# Patient Record
Sex: Male | Born: 1960
Health system: Southern US, Community
[De-identification: ages and names within clinical notes are randomized; demographics above are authoritative.]

## PROBLEM LIST (undated history)

## (undated) DIAGNOSIS — I2510 Atherosclerotic heart disease of native coronary artery without angina pectoris: Secondary | ICD-10-CM

## (undated) DIAGNOSIS — I4891 Unspecified atrial fibrillation: Secondary | ICD-10-CM

## (undated) DIAGNOSIS — R519 Headache, unspecified: Secondary | ICD-10-CM

## (undated) DIAGNOSIS — I4819 Other persistent atrial fibrillation: Secondary | ICD-10-CM

## (undated) DIAGNOSIS — I209 Angina pectoris, unspecified: Secondary | ICD-10-CM

## (undated) DIAGNOSIS — L570 Actinic keratosis: Secondary | ICD-10-CM

## (undated) DIAGNOSIS — I4892 Unspecified atrial flutter: Secondary | ICD-10-CM

## (undated) DIAGNOSIS — K219 Gastro-esophageal reflux disease without esophagitis: Secondary | ICD-10-CM

## (undated) DIAGNOSIS — G4730 Sleep apnea, unspecified: Secondary | ICD-10-CM

## (undated) DIAGNOSIS — G4700 Insomnia, unspecified: Secondary | ICD-10-CM

## (undated) DIAGNOSIS — I351 Nonrheumatic aortic (valve) insufficiency: Secondary | ICD-10-CM

## (undated) DIAGNOSIS — M5430 Sciatica, unspecified side: Secondary | ICD-10-CM

## (undated) DIAGNOSIS — I509 Heart failure, unspecified: Secondary | ICD-10-CM

## (undated) DIAGNOSIS — Z8673 Personal history of transient ischemic attack (TIA), and cerebral infarction without residual deficits: Secondary | ICD-10-CM

## (undated) DIAGNOSIS — I10 Essential (primary) hypertension: Secondary | ICD-10-CM

## (undated) DIAGNOSIS — I429 Cardiomyopathy, unspecified: Secondary | ICD-10-CM

## (undated) DIAGNOSIS — M1990 Unspecified osteoarthritis, unspecified site: Secondary | ICD-10-CM

## (undated) DIAGNOSIS — K760 Fatty (change of) liver, not elsewhere classified: Secondary | ICD-10-CM

## (undated) HISTORY — PX: KNEE SURGERY: SHX244

## (undated) HISTORY — DX: Atherosclerotic heart disease of native coronary artery without angina pectoris: I25.10

## (undated) HISTORY — DX: Cardiomyopathy, unspecified: I42.9

## (undated) HISTORY — DX: Unspecified atrial fibrillation: I48.91

## (undated) HISTORY — PX: COLONOSCOPY: SHX5424

## (undated) HISTORY — DX: Unspecified atrial flutter: I48.92

## (undated) HISTORY — DX: Actinic keratosis: L57.0

## (undated) HISTORY — PX: ESOPHAGOGASTRODUODENOSCOPY: SHX1529

## (undated) HISTORY — DX: Sciatica, unspecified side: M54.30

## (undated) HISTORY — DX: Other persistent atrial fibrillation: I48.19

## (undated) HISTORY — DX: Nonrheumatic aortic (valve) insufficiency: I35.1

## (undated) HISTORY — PX: ROTATOR CUFF REPAIR: SHX139

## (undated) HISTORY — PX: ABLATION: SHX5711

## (undated) HISTORY — PX: HERNIA REPAIR: SHX51

---

## 2010-05-02 ENCOUNTER — Ambulatory Visit: Payer: Self-pay | Admitting: Gastroenterology

## 2011-03-21 ENCOUNTER — Encounter: Payer: Self-pay | Admitting: Internal Medicine

## 2011-03-21 ENCOUNTER — Ambulatory Visit (INDEPENDENT_AMBULATORY_CARE_PROVIDER_SITE_OTHER): Payer: PRIVATE HEALTH INSURANCE | Admitting: Internal Medicine

## 2011-03-21 VITALS — BP 131/91 | HR 84 | Temp 98.2°F | Resp 16 | Ht 72.0 in | Wt 206.0 lb

## 2011-03-21 DIAGNOSIS — B029 Zoster without complications: Secondary | ICD-10-CM

## 2011-03-21 MED ORDER — GABAPENTIN 100 MG PO CAPS: 100.0000 mg | ORAL_CAPSULE | Freq: Three times a day (TID) | ORAL | Status: DC

## 2011-03-21 NOTE — Patient Instructions (Signed)
Shingles (Herpes Zoster)  Shingles is caused by the same virus that causes chicken pox (varicella zoster virus or VZV). Shingles often occurs many years or decades after having chicken pox. That is why it is more common in adults older than 50 years. The virus reactivates and breaks out as an infection in a nerve root.  SYMPTOMS   The initial feeling (sensations) may be pain. This pain is usually described as:    Burning.    Stabbing.    Throbbing.    Tingling in the nerve root.    A red rash will follow in a couple days. The rash may occur in any area of the body and is usually on one side (unilateral) of the body in a band or belt-like pattern. The rash usually starts out as very small blisters (vesicles). They will dry up after 7 to 10 days. This is not usually a significant problem except for the pain it causes.    Long lasting (chronic) pain is more likely in an elderly person. It can last months to years. This condition is called post-herpetic neuralgia.   Shingles can be an extremely severe infection in someone with AIDS, a weakened immune system or with forms of leukemia. It can also be severe if you are taking transplant medications or other medications that weaken the immune system.  TREATMENT  Your caregiver will often treat you with:   Antiviral drugs.    Anti-inflammatory drugs.    Pain medications.    Bed rest is very important in preventing the pain associated with herpes zoster (post-herpetic neuralgia).    Application of heat in the form of a hot-water bottle or electric heating pad or gentle pressure with the hand is recommended to help with the pain or discomfort.   PREVENTION  A varicella zoster vaccine is available to help protect against the virus. The Food and Drug Administration approved the varicella zoster vaccine for individuals 50 years of age and older.  HOME CARE INSTRUCTIONS   Cool compresses to the area of rash may be helpful.    Only take over-the-counter or  prescription medicines for pain, discomfort or fever as directed by your caregiver.    Avoid contact with:    Babies.    Pregnant women.    Children with eczema.    Elderly people with transplants.    People with chronic illnesses, such as leukemia and AIDS.    If the area involved is on your face, you may receive a referral for follow-up to a specialist. It is very important to keep all follow-up appointments. This will help avoid eye complications, chronic pain or disability.   SEEK IMMEDIATE MEDICAL CARE IF:   You develop any pain (headache) in the area of the face or eye. This must be followed carefully by your caregiver or ophthalmologist. An infection in part of your eye (cornea) can be very serious. It could lead to blindness.    You do not have pain relief from prescribed medications.    The redness or swelling spreads.    The area involved becomes very swollen and painful.    You have an oral temperature above 102 F (38 C), not controlled by medicine.    You notice any red or painful lines extending away from the affected area toward your heart (lymphangitis).    Your condition is worsening or has changed.   Document Released: 07/14/2005 Document Re-Released: 01/01/2010  Exit[employer] Patient Information 2011 [employer].

## 2011-03-22 ENCOUNTER — Encounter: Payer: Self-pay | Admitting: Internal Medicine

## 2011-03-22 NOTE — Progress Notes (Signed)
  Subjective:    Patient ID: Jake Liu, male    DOB: [birth year], 50 y.o.   MRN: [id number]  Rash This is a new problem. The current episode started in the past 7 days. The problem has been gradually improving since onset. The affected locations include the torso (right upper chest). The rash is characterized by redness, pain, itchiness, burning and blistering. He was exposed to nothing. Pertinent negatives include no cough, fatigue, fever or shortness of breath. Past treatments include nothing.      Review of Systems  Constitutional: Negative for fever, chills, activity change and fatigue.  Respiratory: Negative for cough and shortness of breath.   Gastrointestinal: Negative for nausea.  Musculoskeletal: Negative for myalgias, back pain, joint swelling and arthralgias.  Skin: Positive for color change and rash.   BP 131/91  Pulse 84  Temp(Src) 98.2 F (36.8 C) (Oral)  Resp 16  Ht 6' (1.829 m)  Wt 206 lb (93.441 kg)  BMI 27.94 kg/m2  SpO2 98%     Objective:   Physical Exam  Constitutional: He is oriented to person, place, and time. He appears well-developed and well-nourished.  HENT:  Head: Normocephalic and atraumatic.  Eyes: Conjunctivae and EOM are normal. Pupils are equal, round, and reactive to light. No scleral icterus.  Neck: Normal range of motion.  Pulmonary/Chest: Effort normal.  Neurological: He is alert and oriented to person, place, and time.  Skin: Skin is warm and dry. Rash noted. Rash is vesicular. There is erythema.          Vesicular lesions in dermatomal distribution right upper chest wall  Psychiatric: He has a normal mood and affect. His behavior is normal. Judgment and thought content normal.          Assessment & Plan:  1. Varicella Zoster - Jake Liu is a 50 year old male who presents for an acute visit for a rash across his right upper chest. The rash is vesicular in appearance and consistent with varicella-zoster. Given that this rash began  almost one week ago, there is likely little to no benefit from antiviral medications. He reports the rash has gradually improved. He denies significant pain from the rash at this time. We discussed using Neurontin should the rash become more painful. He was given a prescription for Neurontin 100 mg by mouth 3 times daily as needed. He was instructed that neurontin can cause drowsiness. If the pain increases, he was instructed that we may need to increase the dose of Neurontin. We discussed the natural course of shingles, and he was given information about shingles. Should he develop fever, worsening pain that is not controlled by Neurontin, or other concerns he will return to clinic or call.

## 2011-04-14 ENCOUNTER — Other Ambulatory Visit: Payer: Self-pay | Admitting: Internal Medicine

## 2011-04-14 DIAGNOSIS — B029 Zoster without complications: Secondary | ICD-10-CM

## 2011-04-14 MED ORDER — GABAPENTIN 100 MG PO CAPS: 100.0000 mg | ORAL_CAPSULE | Freq: Three times a day (TID) | ORAL | Status: DC

## 2011-04-14 NOTE — Telephone Encounter (Signed)
Patient says that he lost his rx for the neurontin and is asking if he can have another rx called in to [facility] on [address]

## 2011-04-14 NOTE — Telephone Encounter (Signed)
Pt lost rx  That you wrote for him for is rash  .  You gave it to him when he was seen at his last office visit here.  [facility] [address] [employer]

## 2011-04-15 ENCOUNTER — Telehealth: Payer: Self-pay | Admitting: Internal Medicine

## 2011-04-15 NOTE — Telephone Encounter (Signed)
Faxed the gabapentin to pharmacy

## 2011-05-16 ENCOUNTER — Ambulatory Visit (INDEPENDENT_AMBULATORY_CARE_PROVIDER_SITE_OTHER): Payer: PRIVATE HEALTH INSURANCE | Admitting: Internal Medicine

## 2011-05-16 ENCOUNTER — Encounter: Payer: Self-pay | Admitting: Internal Medicine

## 2011-05-16 VITALS — BP 112/88 | HR 78 | Temp 97.8°F | Resp 16 | Ht 70.0 in | Wt 205.0 lb

## 2011-05-16 DIAGNOSIS — L309 Dermatitis, unspecified: Secondary | ICD-10-CM

## 2011-05-16 DIAGNOSIS — G4700 Insomnia, unspecified: Secondary | ICD-10-CM

## 2011-05-16 DIAGNOSIS — L259 Unspecified contact dermatitis, unspecified cause: Secondary | ICD-10-CM

## 2011-05-16 MED ORDER — ZOLPIDEM TARTRATE 5 MG PO TABS: 5.0000 mg | ORAL_TABLET | Freq: Every evening | ORAL | Status: DC | PRN

## 2011-05-16 MED ORDER — TRIAMCINOLONE ACETONIDE 0.025 % EX OINT: TOPICAL_OINTMENT | Freq: Two times a day (BID) | CUTANEOUS | Status: DC

## 2011-05-16 NOTE — Progress Notes (Signed)
Subjective:    Patient ID: Jake Liu, male    DOB: 05/26/1961, 50 y.o.   MRN: [id number]  HPI 50 year old male who presents for an acute visit secondary to rash. He was seen previously for a rash over his right upper chest which initially looked vesicular and was painful suggesting shingles. However, this rash became more papular in appearance and he developed a second area over his mid lower abdomen. He denies any new lotions or soaps. He denies any new perfumes. He reports both areas are itchy. He denies any fever or chills. He denies any other lesions.  He is also concerned today about chronic insomnia. He reports that he has used Ambien in the past with improvement. He travels frequently for work and is often away from home. He often finds it difficult to fall asleep and stay asleep. He would like to restart Ambien.  Outpatient Encounter Prescriptions as of 05/16/2011  Medication Sig Dispense Refill  . gabapentin (NEURONTIN) 100 MG capsule Take 1 capsule (100 mg total) by mouth 3 (three) times daily.  90 capsule  2  . triamcinolone (KENALOG) 0.025 % ointment Apply topically 2 (two) times daily.  30 g  3  . zolpidem (AMBIEN) 5 MG tablet Take 1 tablet (5 mg total) by mouth at bedtime as needed for sleep.  30 tablet  4    Review of Systems  Constitutional: Positive for fatigue. Negative for fever, chills and diaphoresis.  Respiratory: Negative for shortness of breath.   Cardiovascular: Negative for chest pain.  Gastrointestinal: Negative for diarrhea and constipation.  Skin: Positive for color change and rash.  Psychiatric/Behavioral: Positive for sleep disturbance.   BP 112/88  Pulse 78  Temp(Src) 97.8 F (36.6 C) (Oral)  Resp 16  Ht 5\' 10"  (1.778 m)  Wt 205 lb (92.987 kg)  BMI 29.41 kg/m2  SpO2 97%     Objective:   Physical Exam  Constitutional: He is oriented to person, place, and time. He appears well-developed and well-nourished. No distress.  HENT:  Head:  Normocephalic and atraumatic.  Right Ear: External ear normal.  Left Ear: External ear normal.  Nose: Nose normal.  Mouth/Throat: Oropharynx is clear and moist. No oropharyngeal exudate.  Eyes: Conjunctivae and EOM are normal. Pupils are equal, round, and reactive to light. Right eye exhibits no discharge. Left eye exhibits no discharge. No scleral icterus.  Neck: Normal range of motion. Neck supple. No tracheal deviation present. No thyromegaly present.  Cardiovascular: Normal rate, regular rhythm and normal heart sounds.  Exam reveals no gallop and no friction rub.   No murmur heard. Pulmonary/Chest: Effort normal and breath sounds normal. No respiratory distress. He has no wheezes. He has no rales. He exhibits no tenderness.  Musculoskeletal: Normal range of motion. He exhibits no edema.  Lymphadenopathy:    He has no cervical adenopathy.  Neurological: He is alert and oriented to person, place, and time. No cranial nerve deficit. Coordination normal.  Skin: Skin is warm and dry. Rash noted. Rash is papular. He is not diaphoretic. No erythema. No pallor.     Psychiatric: He has a normal mood and affect. His behavior is normal. Judgment and thought content normal.          Assessment & Plan:  1. Dermatitis - appearance of rash at this point seems most consistent with contact dermatitis. Given the location on his right upper chest and mid lower abdomen question metal or nickel allergy. He frequently carries a bag with him which  has a buckle with some metal which would be located in the location of rash on his right upper chest. Will try topical triamcinolone cream to see if any improvement. We'll also set him up with dermatology for both general evaluation and reevaluation of this rash if it is persistent.  2. Insomnia -patient with chronic insomnia which has improved in the past with use of Ambien. Will restart Ambien today. He was cautioned not to use Ambien and drinking alcohol. He was  cautioned not to drive will taking Ambien. He understands that he'll need 8-10 hours for sleep when using this medication.

## 2011-05-19 ENCOUNTER — Other Ambulatory Visit: Payer: Self-pay | Admitting: Internal Medicine

## 2011-05-19 DIAGNOSIS — G4700 Insomnia, unspecified: Secondary | ICD-10-CM

## 2011-05-19 MED ORDER — ZOLPIDEM TARTRATE 5 MG PO TABS: 5.0000 mg | ORAL_TABLET | Freq: Every evening | ORAL | Status: DC | PRN

## 2011-05-29 ENCOUNTER — Ambulatory Visit (INDEPENDENT_AMBULATORY_CARE_PROVIDER_SITE_OTHER): Payer: PRIVATE HEALTH INSURANCE | Admitting: Internal Medicine

## 2011-05-29 DIAGNOSIS — R200 Anesthesia of skin: Secondary | ICD-10-CM

## 2011-05-29 DIAGNOSIS — R209 Unspecified disturbances of skin sensation: Secondary | ICD-10-CM

## 2011-05-29 NOTE — Progress Notes (Signed)
Patient c/o numbness and tingling in his feet when he gets cold off and on x 1 wk. Father is a diabetic and pt is worried that symptoms could be caused by diabetes. I advised patient that he should be seen by MD to discuss b/c fasting CBG was only slightly over normal range. Pt scheduled f/u OV.

## 2011-06-04 ENCOUNTER — Ambulatory Visit: Payer: PRIVATE HEALTH INSURANCE | Admitting: Internal Medicine

## 2011-06-05 ENCOUNTER — Ambulatory Visit (INDEPENDENT_AMBULATORY_CARE_PROVIDER_SITE_OTHER): Payer: PRIVATE HEALTH INSURANCE | Admitting: Internal Medicine

## 2011-06-05 ENCOUNTER — Encounter: Payer: Self-pay | Admitting: Internal Medicine

## 2011-06-05 DIAGNOSIS — I7300 Raynaud's syndrome without gangrene: Secondary | ICD-10-CM

## 2011-06-05 DIAGNOSIS — I739 Peripheral vascular disease, unspecified: Secondary | ICD-10-CM

## 2011-06-05 DIAGNOSIS — L309 Dermatitis, unspecified: Secondary | ICD-10-CM

## 2011-06-05 DIAGNOSIS — L259 Unspecified contact dermatitis, unspecified cause: Secondary | ICD-10-CM

## 2011-06-05 MED ORDER — PREDNISONE (PAK) 10 MG PO TABS: 20.0000 mg | ORAL_TABLET | Freq: Every day | ORAL | Status: AC

## 2011-06-05 NOTE — Patient Instructions (Signed)
Raynaud's Syndrome Raynaud's Syndrome is a disorder of the blood vessels in your hands and feet. It occurs when small arteries of the arms/hands or legs/feet become sensitive to cold or emotional upset. This causes the arteries to constrict, or narrow, and reduces blood flow to the area. The color in the fingers or toes changes from white to bluish to red and this is not usually painful. There may be numbness and tingling. Sores on the skin (ulcers) can form. Symptoms are usually relieved by warming. HOME CARE INSTRUCTIONS   Avoid exposure to cold. Keep your whole body warm and dry. Dress in layers. Wear mittens or gloves when handling ice or frozen food and when outdoors. Use holders for glasses or cans containing cold drinks. If possible, stay indoors during cold weather.   Limit your use of caffeine. Switch to decaffeinated coffee, tea, and soda pop. Avoid chocolate.   Avoid smoking or being around cigarette smoke. Smoke will make symptoms worse.   Wear loose fitting socks and comfortable, roomy shoes.   Avoid vibrating tools and machinery.   If possible, avoid stressful and emotional situations. Exercise, meditation and yoga may help you cope with stress. Biofeedback may be useful.   Ask your caregiver about medicine (calcium channel blockers) that may control Raynaud's phenomena.  SEEK MEDICAL CARE IF:   Your discomfort becomes worse, despite conservative treatment.   You develop sores on your fingers and toes that do not heal.  Document Released: 07/11/2000 Document Revised: 03/26/2011 Document Reviewed: 07/18/2008 [employer] Patient Information 2012 [employer]. 

## 2011-06-05 NOTE — Progress Notes (Signed)
Subjective:    Patient ID: Jake Liu, male    DOB: June 15, 1961, 50 y.o.   MRN: [id number]  HPI 50 year old male presents for an acute visit complaining of color change in his feet and hands and rash over his abdomen and buttocks. Patient notes that over the last several weeks he has noted white or purplish appearance to his distal fingers and toes especially when in a cold environment. He reports that his extremities are painful during this time. He also notes some cramping in his calves when walking. He denies any fever or chills. He denies any joint pain. He has not taken any medication for this.  He also notes a chronic rash which initially started on his anterior lower abdomen and chest. This was treated with topical triamcinolone cream with some improvement over his chest however the rash recurred on his anterior lower abdomen and on his lower back and buttocks. He reports that the rash is itchy. He denies any known exposure to new lotions or perfumes. He does note that he has increased his use of fabric softeners recently. He denies any fever or chills.  Outpatient Encounter Prescriptions as of 06/05/2011  Medication Sig Dispense Refill  . predniSONE (STERAPRED UNI-PAK) 10 MG tablet Take 2 tablets (20 mg total) by mouth daily.  5 tablet  0  . triamcinolone (KENALOG) 0.025 % ointment Apply topically 2 (two) times daily.  30 g  3  . DISCONTD: zolpidem (AMBIEN) 5 MG tablet Take 1 tablet (5 mg total) by mouth at bedtime as needed for sleep.  30 tablet  4    Review of Systems  Constitutional: Negative for fever, chills, activity change, appetite change, fatigue and unexpected weight change.  Eyes: Negative for visual disturbance.  Respiratory: Negative for cough and shortness of breath.   Cardiovascular: Negative for chest pain, palpitations and leg swelling.  Gastrointestinal: Negative for abdominal pain and abdominal distention.  Genitourinary: Negative for dysuria, urgency and difficulty  urinating.  Musculoskeletal: Positive for myalgias. Negative for arthralgias and gait problem.  Skin: Positive for color change and rash.  Hematological: Negative for adenopathy.  Psychiatric/Behavioral: Negative for sleep disturbance and dysphoric mood. The patient is not nervous/anxious.    BP 102/60  Pulse 95  Temp(Src) 98.1 F (36.7 C) (Oral)  Wt 204 lb (92.534 kg)  SpO2 95%     Objective:   Physical Exam  Constitutional: He is oriented to person, place, and time. He appears well-developed and well-nourished. No distress.  HENT:  Head: Normocephalic and atraumatic.  Right Ear: External ear normal.  Left Ear: External ear normal.  Nose: Nose normal.  Mouth/Throat: Oropharynx is clear and moist. No oropharyngeal exudate.  Eyes: Conjunctivae and EOM are normal. Pupils are equal, round, and reactive to light. Right eye exhibits no discharge. Left eye exhibits no discharge. No scleral icterus.  Neck: Normal range of motion. Neck supple. No tracheal deviation present. No thyromegaly present.  Cardiovascular: Normal rate, regular rhythm and normal heart sounds.  Exam reveals no gallop and no friction rub.   No murmur heard. Pulmonary/Chest: Effort normal and breath sounds normal. No respiratory distress. He has no wheezes. He has no rales. He exhibits no tenderness.  Musculoskeletal: Normal range of motion. He exhibits no edema.  Lymphadenopathy:    He has no cervical adenopathy.  Neurological: He is alert and oriented to person, place, and time. No cranial nerve deficit. Coordination normal.  Skin: Skin is warm and dry. No rash noted. He is not diaphoretic.  No erythema. No pallor.     Psychiatric: He has a normal mood and affect. His behavior is normal. Judgment and thought content normal.          Assessment & Plan:  1. Raynauds -symptoms seem most consistent with Raynauds. Will check CBC, CMP, ANA, ESR, CRP, RF with labs today. Education given on Raynauds phenomenon. Given  his symptoms of calf cramping with walking which suggest claudication, will get arterial ultrasound of his lower extremities. Patient will followup in one month.  2. Contact Dermatitis - Failed topical triamcinolone. Will try 5 day course of oral prednisone to see if any improvement. Derm appointment in 3 weeks. Follow up here in 26month.

## 2011-06-06 ENCOUNTER — Telehealth: Payer: Self-pay | Admitting: Internal Medicine

## 2011-06-06 LAB — COMPREHENSIVE METABOLIC PANEL
AST: 20 U/L (ref 0–37)
BUN: 16 mg/dL (ref 6–23)
Calcium: 9.6 mg/dL (ref 8.4–10.5)
Chloride: 105 mEq/L (ref 96–112)
Creatinine, Ser: 1 mg/dL (ref 0.4–1.5)
GFR: 86.67 mL/min (ref >60.00)

## 2011-06-06 LAB — CBC WITH DIFFERENTIAL/PLATELET
Basophils Relative: 0.5 % (ref 0.0–3.0)
Eosinophils Relative: 3.2 % (ref 0.0–5.0)
Lymphocytes Relative: 28.5 % (ref 12.0–46.0)
Monocytes Relative: 7.7 % (ref 3.0–12.0)
Neutrophils Relative %: 60.1 % (ref 43.0–77.0)
RBC: 5.03 Mil/uL (ref 4.22–5.81)
WBC: 8.1 10*3/uL (ref 4.5–10.5)

## 2011-06-06 LAB — HEMOGLOBIN A1C: Hgb A1c MFr Bld: 5.3 % (ref 4.6–6.5)

## 2011-06-06 LAB — SEDIMENTATION RATE: Sed Rate: 3 mm/hr (ref 0–22)

## 2011-06-06 NOTE — Telephone Encounter (Signed)
[phone] Pt came in wanting his lab results.  He also had a couple questions also

## 2011-06-06 NOTE — Telephone Encounter (Signed)
Patient informed of results.  

## 2011-06-07 LAB — C-REACTIVE PROTEIN: CRP: 0.02 mg/dL (ref <0.60)

## 2011-06-07 LAB — RHEUMATOID FACTOR: Rhuematoid fact SerPl-aCnc: <10 IU/mL (ref <=14)

## 2011-07-03 ENCOUNTER — Telehealth: Payer: Self-pay | Admitting: Internal Medicine

## 2011-07-03 MED ORDER — ESZOPICLONE 3 MG PO TABS: 3.0000 mg | ORAL_TABLET | Freq: Every evening | ORAL | Status: DC | PRN

## 2011-07-03 NOTE — Telephone Encounter (Signed)
[phone] Pt walked in and stated that dr Currie [facility] recommend that mr Jake Liu try lunesta for sleep and pt wanted to know if you would prescribe this for him [facility] [address]

## 2011-07-03 NOTE — Telephone Encounter (Signed)
Fine. We can try Lunesta 3mg  po at bedtime, disp 30 with 3 refill. HE SHOULD NOT USE AMBIEN when using Lunesta.

## 2011-07-03 NOTE — Telephone Encounter (Signed)
Patient informed. 

## 2011-08-19 ENCOUNTER — Encounter: Payer: Self-pay | Admitting: Internal Medicine

## 2011-08-19 ENCOUNTER — Ambulatory Visit (INDEPENDENT_AMBULATORY_CARE_PROVIDER_SITE_OTHER): Payer: PRIVATE HEALTH INSURANCE | Admitting: Internal Medicine

## 2011-08-19 ENCOUNTER — Telehealth: Payer: Self-pay | Admitting: Internal Medicine

## 2011-08-19 VITALS — BP 120/80 | HR 72 | Temp 97.4°F | Wt 212.0 lb

## 2011-08-19 DIAGNOSIS — J189 Pneumonia, unspecified organism: Secondary | ICD-10-CM

## 2011-08-19 MED ORDER — DOXYCYCLINE HYCLATE 100 MG PO TABS: 100.0000 mg | ORAL_TABLET | Freq: Two times a day (BID) | ORAL | Status: DC

## 2011-08-19 MED ORDER — LEVOFLOXACIN 500 MG PO TABS: 500.0000 mg | ORAL_TABLET | Freq: Every day | ORAL | Status: DC

## 2011-08-19 MED ORDER — BENZONATATE 200 MG PO CAPS: 200.0000 mg | ORAL_CAPSULE | Freq: Three times a day (TID) | ORAL | Status: AC | PRN

## 2011-08-19 NOTE — Telephone Encounter (Signed)
Spoke with patient. He is going to see Dr. Darrick Huntsman today.

## 2011-08-19 NOTE — Patient Instructions (Signed)
I am prescribing two antibiotics to cover everything you may have come into contact with at the hospital.   Take the doxy twice daily with breakfast and dinner to avoid nauseau.  Take levaquin once daily anytime.  Take a probiotic capsule or eat a serving of yogurt dialy to prevent diarrhea from the antibiotics.Jake Liu is a cough pill to use day or night    Call for a chest x ray if not resolved in 7 to 10 days

## 2011-08-19 NOTE — Telephone Encounter (Signed)
[phone] Pt called wanted an appointment today with dr walker.  He is loosing his voice and hurts to breath.  Offered 11:15 with dr Darrick Huntsman.  He wanted to see dr walker

## 2011-08-20 ENCOUNTER — Encounter: Payer: Self-pay | Admitting: Internal Medicine

## 2011-08-20 DIAGNOSIS — J189 Pneumonia, unspecified organism: Secondary | ICD-10-CM | POA: Insufficient documentation

## 2011-08-20 NOTE — Progress Notes (Signed)
Subjective:    Patient ID: Jake Liu, male    DOB: 1960-10-18, 51 y.o.   MRN: [id number]  HPI Jake Liu is a 51 year old white male with a no significant past medical history who presents with sudden onset of productive cough and pleuritic chest pain which began 24 hours after visiting a friend in the hospital. He has had low-grade fevers and malaise and a cough productive of green and greenish brown sputum. He denies wheezing or shortness of breath and denies myalgias. He has been taking over-the-counter Mucinex DM with no significant improvement. Past Medical History  Diagnosis Date  . Sciatica    Current Outpatient Prescriptions on File Prior to Visit  Medication Sig Dispense Refill  . Eszopiclone (ESZOPICLONE) 3 MG TABS Take 1 tablet (3 mg total) by mouth at bedtime as needed. Take immediately before bedtime  30 tablet  3  . triamcinolone (KENALOG) 0.025 % ointment Apply topically 2 (two) times daily.  30 g  3    Review of Systems  Constitutional: Positive for fever and fatigue. Negative for chills, diaphoresis, activity change, appetite change and unexpected weight change.  HENT: Negative for hearing loss, ear pain, nosebleeds, congestion, sore throat, facial swelling, rhinorrhea, sneezing, drooling, mouth sores, trouble swallowing, neck pain, neck stiffness, dental problem, voice change, postnasal drip, sinus pressure, tinnitus and ear discharge.   Eyes: Negative for photophobia, pain, discharge, redness, itching and visual disturbance.  Respiratory: Positive for cough and chest tightness. Negative for apnea, choking, shortness of breath, wheezing and stridor.   Cardiovascular: Negative for chest pain, palpitations and leg swelling.  Gastrointestinal: Negative for nausea, vomiting, abdominal pain, diarrhea, constipation, blood in stool, abdominal distention, anal bleeding and rectal pain.  Genitourinary: Negative for dysuria, urgency, frequency, hematuria, flank pain, decreased urine  volume, scrotal swelling, difficulty urinating and testicular pain.  Musculoskeletal: Negative for myalgias, back pain, joint swelling, arthralgias and gait problem.  Skin: Negative for color change, rash and wound.  Neurological: Negative for dizziness, tremors, seizures, syncope, speech difficulty, weakness, light-headedness, numbness and headaches.  Psychiatric/Behavioral: Negative for suicidal ideas, hallucinations, behavioral problems, confusion, sleep disturbance, dysphoric mood, decreased concentration and agitation. The patient is not nervous/anxious.        Objective:   Physical Exam  Constitutional: He is oriented to person, place, and time.  HENT:  Head: Normocephalic and atraumatic.  Mouth/Throat: Oropharynx is clear and moist.  Eyes: Conjunctivae and EOM are normal.  Neck: Normal range of motion. Neck supple. No JVD present. No thyromegaly present.  Cardiovascular: Normal rate, regular rhythm and normal heart sounds.   Pulmonary/Chest: Effort normal. He has decreased breath sounds in the right upper field. He has no wheezes. He has no rhonchi. He has no rales.  Abdominal: Soft. Bowel sounds are normal. He exhibits no mass. There is no tenderness. There is no rebound.  Musculoskeletal: Normal range of motion. He exhibits no edema.  Neurological: He is alert and oriented to person, place, and time.  Skin: Skin is warm and dry.  Psychiatric: He has a normal mood and affect.       Assessment & Plan:   Pneumonia Given his recent hospital exposure will cover empicially for both  strep pneumoniae and MRSA with Levaquin and doxycycline.    Updated Medication List Outpatient Encounter Prescriptions as of 08/19/2011  Medication Sig Dispense Refill  . benzonatate (TESSALON) 200 MG capsule Take 1 capsule (200 mg total) by mouth 3 (three) times daily as needed for cough.  45 capsule  0  .  doxycycline (VIBRA-TABS) 100 MG tablet Take 1 tablet (100 mg total) by mouth 2 (two) times  daily.  14 tablet  0  . Eszopiclone (ESZOPICLONE) 3 MG TABS Take 1 tablet (3 mg total) by mouth at bedtime as needed. Take immediately before bedtime  30 tablet  3  . levofloxacin (LEVAQUIN) 500 MG tablet Take 1 tablet (500 mg total) by mouth daily.  7 tablet  0  . triamcinolone (KENALOG) 0.025 % ointment Apply topically 2 (two) times daily.  30 g  3

## 2011-08-20 NOTE — Assessment & Plan Note (Signed)
Given his recent hospital exposure will cover. Weight for both strep pneumoniae and MRSA with Levaquin and doxycycline.

## 2011-08-26 ENCOUNTER — Telehealth: Payer: Self-pay | Admitting: Internal Medicine

## 2011-08-26 NOTE — Telephone Encounter (Signed)
Patient is not getting any better , loosing his voice thinking he is not out of the woods from the pneumonia.

## 2011-08-27 ENCOUNTER — Ambulatory Visit (INDEPENDENT_AMBULATORY_CARE_PROVIDER_SITE_OTHER): Payer: PRIVATE HEALTH INSURANCE | Admitting: Internal Medicine

## 2011-08-27 ENCOUNTER — Ambulatory Visit (INDEPENDENT_AMBULATORY_CARE_PROVIDER_SITE_OTHER)
Admission: RE | Admit: 2011-08-27 | Discharge: 2011-08-27 | Disposition: A | Discharge status: Home / Self Care | Payer: PRIVATE HEALTH INSURANCE | Source: Ambulatory Visit | Attending: Internal Medicine | Admitting: Internal Medicine

## 2011-08-27 ENCOUNTER — Encounter: Payer: Self-pay | Admitting: Internal Medicine

## 2011-08-27 VITALS — BP 110/80 | HR 75 | Temp 97.6°F | Ht 72.0 in | Wt 209.0 lb

## 2011-08-27 DIAGNOSIS — J189 Pneumonia, unspecified organism: Secondary | ICD-10-CM

## 2011-08-27 IMAGING — CR DG CHEST 2V
3 series · 3 of 3 positions shown · non-contrast
Comparison: None.

CLINICAL DATA: History of cough.  Pneumonia.

CHEST - 2 VIEW

[view not recorded (1 of 3)]
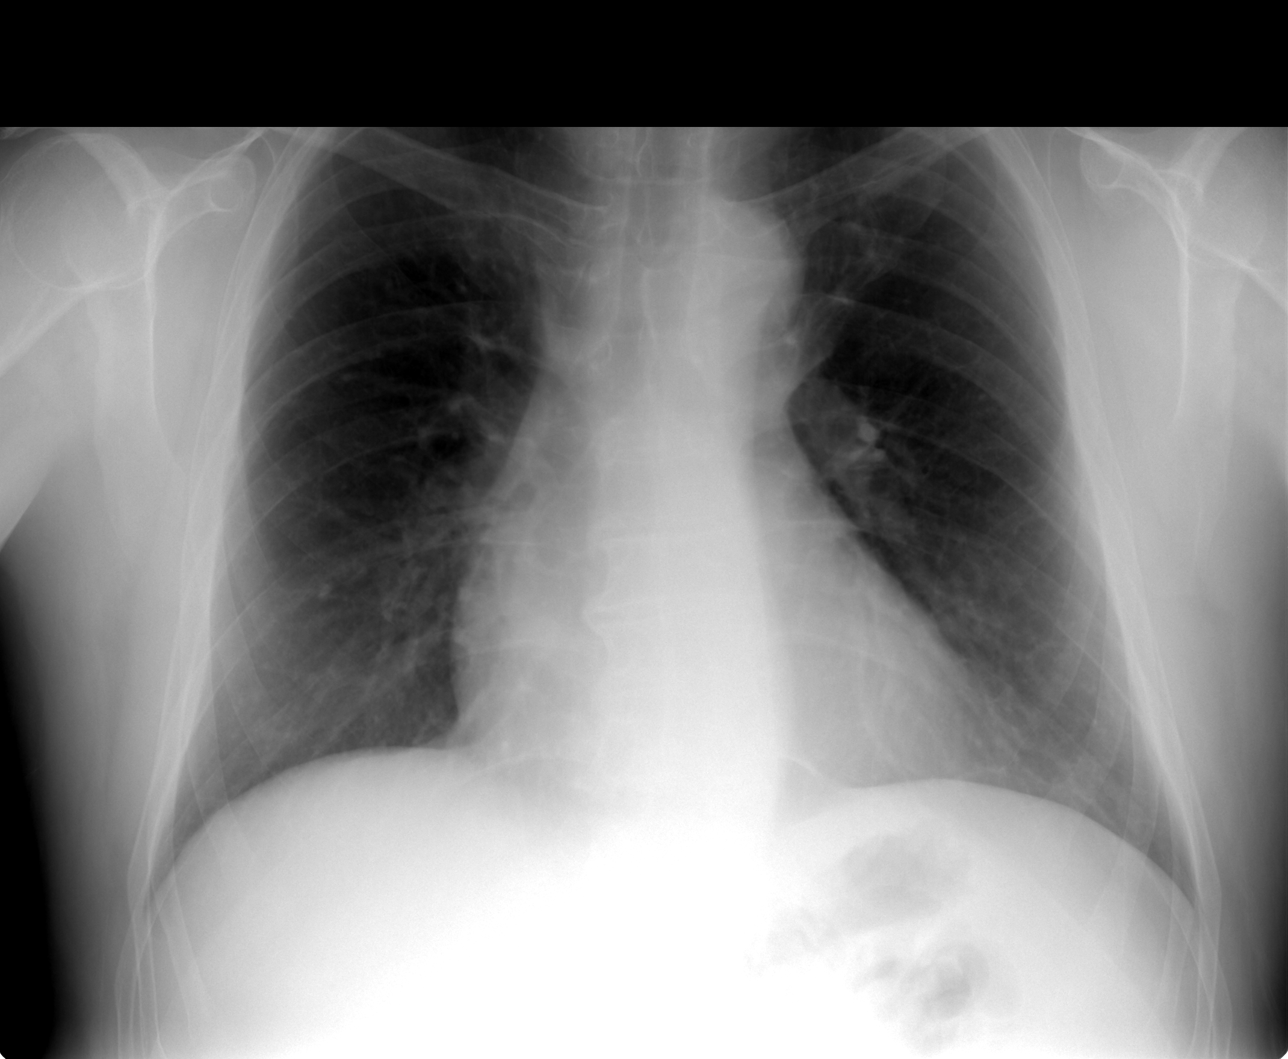

[view not recorded (2 of 3)]
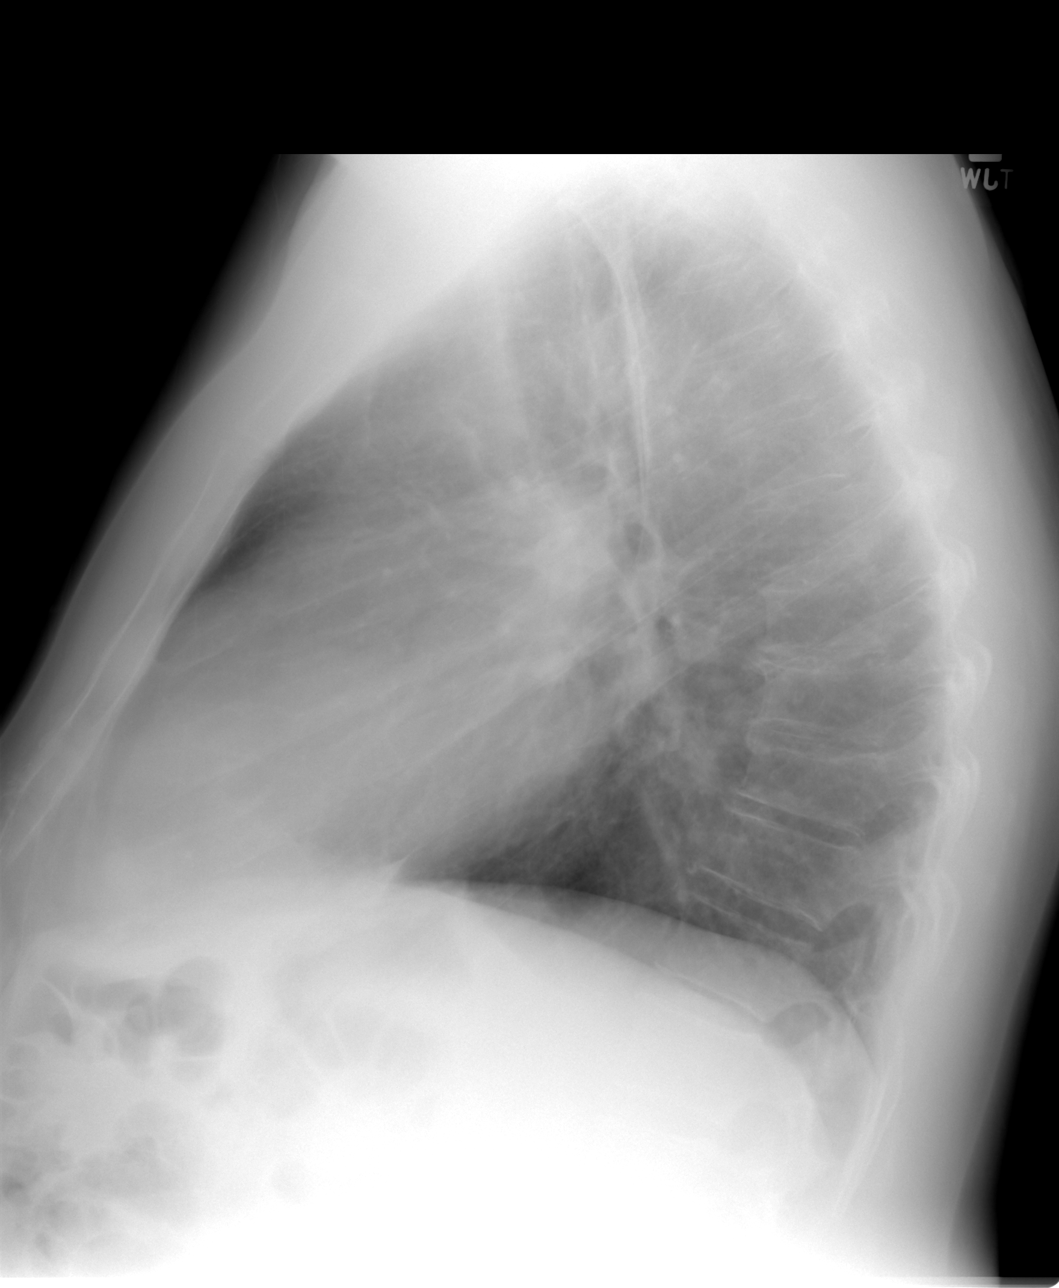

[view not recorded (3 of 3)]
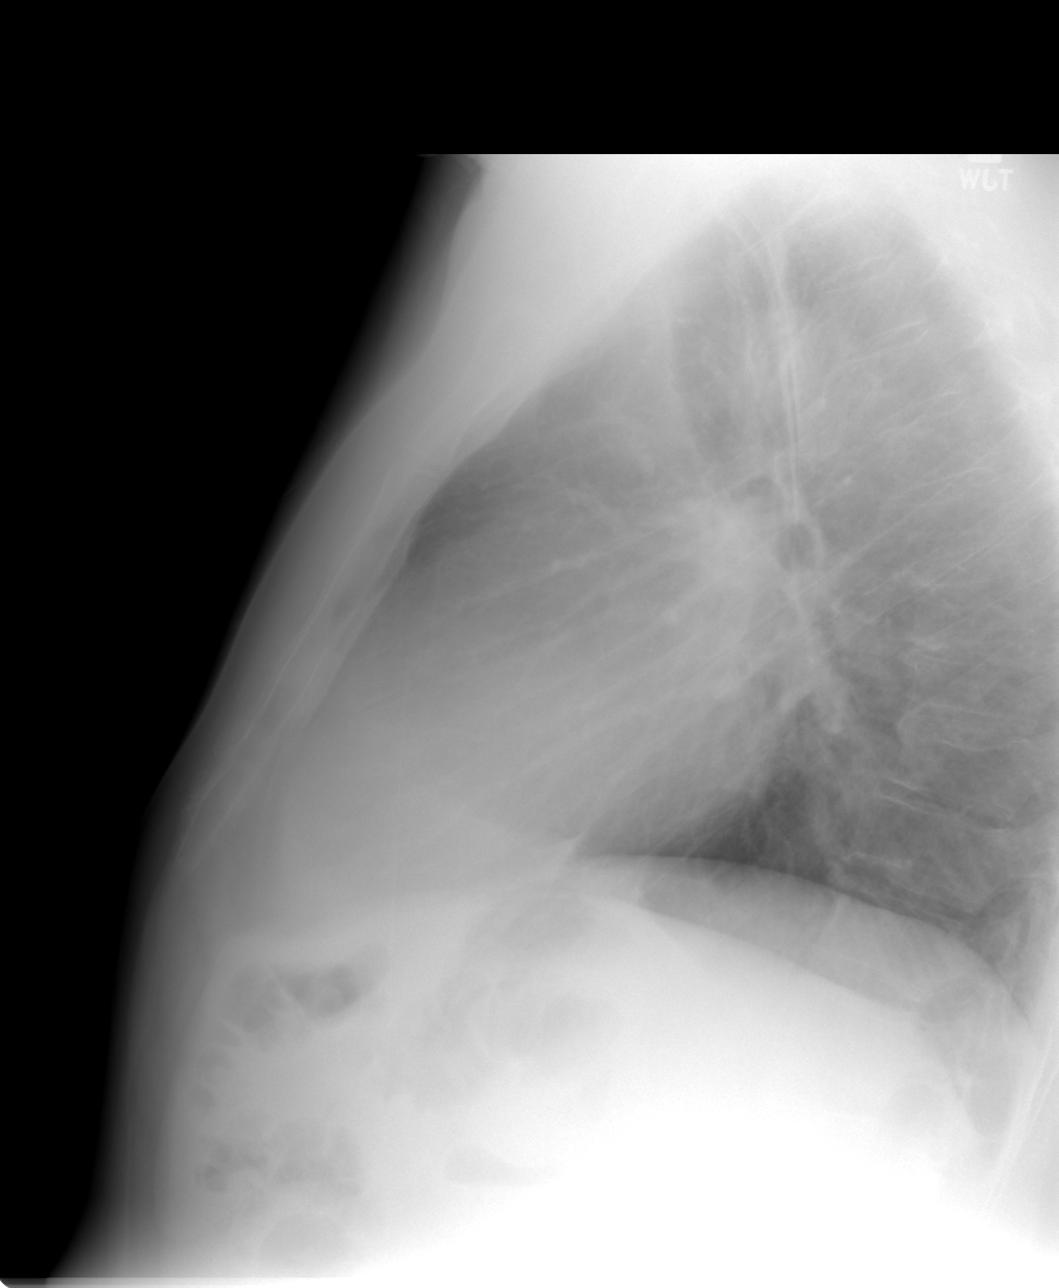

[3 of 3 positions shown; findings below may reference images not displayed]

FINDINGS: Lung volumes are normal.  No consolidative airspace
disease.  No pleural effusions.  No pneumothorax.  No pulmonary
nodule or mass noted.  Pulmonary vasculature and the
cardiomediastinal silhouette are within normal limits.  Dense
nodules projecting in the right mid lung and in the right apex
likely reflecting granulomas.
IMPRESSION: 1.  No radiographic evidence of acute cardiopulmonary disease.
2.  Small dense nodules projecting in the right mid lung and right
apex.  These are favored to represent calcified granulomas,
however, no prior studies are available for comparison.  Repeat PA
and lateral chest radiograph in 2-3 months is recommended to
confirm stability of these nodules.

These results will be called to the ordering clinician or
representative by the Radiologist Assistant, and communication
documented in the PACS Dashboard.

## 2011-08-27 MED ORDER — GUAIFENESIN-CODEINE 100-10 MG/5ML PO SYRP: 5.0000 mL | ORAL_SOLUTION | Freq: Two times a day (BID) | ORAL | Status: AC | PRN

## 2011-08-27 MED ORDER — LEVOFLOXACIN 750 MG PO TABS: 750.0000 mg | ORAL_TABLET | Freq: Every day | ORAL | Status: AC

## 2011-08-27 NOTE — Progress Notes (Signed)
Subjective:    Patient ID: Jake Liu, male    DOB: 09-15-1960, 51 y.o.   MRN: [id number]  HPI 51 year old male with recent history of upper respiratory infection with productive cough, fever, chills, fatigue presents for followup. He notes that his symptoms initially improved with use of Levaquin and doxycycline, however been more recurrent after stopping these medications. He reports cough which is productive of yellow sputum. He reports that the cough is severe and is not improved with the use of Tessalon.He denies any shortness of breath or chest pain. He notes some hoarseness. He denies any recurrent fever or chills. He is not a smoker. He has no known history of asthma or COPD.   Outpatient Encounter Prescriptions as of 08/27/2011  Medication Sig Dispense Refill  . benzonatate (TESSALON) 200 MG capsule Take 1 capsule (200 mg total) by mouth 3 (three) times daily as needed for cough.  45 capsule  0  . Eszopiclone (ESZOPICLONE) 3 MG TABS Take 1 tablet (3 mg total) by mouth at bedtime as needed. Take immediately before bedtime  30 tablet  3  . triamcinolone (KENALOG) 0.025 % ointment Apply topically 2 (two) times daily.  30 g  3  . guaiFENesin-codeine (ROBITUSSIN AC) 100-10 MG/5ML syrup Take 5 mLs by mouth 2 (two) times daily as needed for cough.  240 mL  0  . levofloxacin (LEVAQUIN) 750 MG tablet Take 1 tablet (750 mg total) by mouth daily.  7 tablet  0    Review of Systems  Constitutional: Positive for fatigue. Negative for fever, chills and activity change.  HENT: Negative for hearing loss, ear pain, nosebleeds, congestion, sore throat, rhinorrhea, sneezing, trouble swallowing, neck pain, neck stiffness, voice change, postnasal drip, sinus pressure, tinnitus and ear discharge.   Eyes: Negative for discharge, redness, itching and visual disturbance.  Respiratory: Positive for cough. Negative for chest tightness, shortness of breath, wheezing and stridor.   Cardiovascular: Negative for  chest pain and leg swelling.  Musculoskeletal: Negative for myalgias and arthralgias.  Skin: Negative for color change and rash.  Neurological: Negative for dizziness, facial asymmetry and headaches.  Psychiatric/Behavioral: Negative for sleep disturbance.   BP 110/80  Pulse 75  Temp(Src) 97.6 F (36.4 C) (Oral)  Ht 6' (1.829 m)  Wt 209 lb (94.802 kg)  BMI 28.35 kg/m2  SpO2 97%     Objective:   Physical Exam  Constitutional: He is oriented to person, place, and time. He appears well-developed and well-nourished. No distress.  HENT:  Head: Normocephalic and atraumatic.  Right Ear: External ear normal.  Left Ear: External ear normal.  Nose: Nose normal.  Mouth/Throat: Oropharynx is clear and moist. No oropharyngeal exudate.  Eyes: Conjunctivae and EOM are normal. Pupils are equal, round, and reactive to light. Right eye exhibits no discharge. Left eye exhibits no discharge. No scleral icterus.  Neck: Normal range of motion. Neck supple. No tracheal deviation present. No thyromegaly present.  Cardiovascular: Normal rate, regular rhythm and normal heart sounds.  Exam reveals no gallop and no friction rub.   No murmur heard. Pulmonary/Chest: No accessory muscle usage. Not tachypneic. No respiratory distress. He has no decreased breath sounds. He has no wheezes. He has rhonchi in the left lower field. He has no rales. He exhibits no tenderness.  Musculoskeletal: Normal range of motion. He exhibits no edema.  Lymphadenopathy:    He has no cervical adenopathy.  Neurological: He is alert and oriented to person, place, and time. No cranial nerve deficit. Coordination normal.  Skin: Skin is warm and dry. No rash noted. He is not diaphoretic. No erythema. No pallor.  Psychiatric: He has a normal mood and affect. His behavior is normal. Judgment and thought content normal.          Assessment & Plan:

## 2011-08-27 NOTE — Telephone Encounter (Signed)
Left mess to call office back for more details. Notes state, CXR if not resolved. OK for cxr if pt is not better? Or do you want OV first?

## 2011-08-27 NOTE — Telephone Encounter (Signed)
OK for CXR then needs appt, maybe Friday?

## 2011-08-27 NOTE — Assessment & Plan Note (Signed)
Exam is most consistent with left lower lobe pneumonia. Will get repeat chest x-ray. We'll plan to continue Levaquin for another 7 days, given that he had marked improvement in his productive cough with use of Levaquin.  He will use codeine as needed for cough.He will call if symptoms are worsening or not improving.

## 2011-08-27 NOTE — Telephone Encounter (Signed)
Scheduled for OV today.

## 2011-08-28 ENCOUNTER — Telehealth: Payer: Self-pay | Admitting: Internal Medicine

## 2011-08-28 NOTE — Telephone Encounter (Signed)
Patient informed. 

## 2011-08-28 NOTE — Telephone Encounter (Signed)
Patient wanting results of his x-ray . His Internet is down.

## 2011-12-01 ENCOUNTER — Telehealth: Payer: Self-pay | Admitting: *Deleted

## 2011-12-01 ENCOUNTER — Other Ambulatory Visit: Payer: Self-pay | Admitting: Internal Medicine

## 2011-12-01 ENCOUNTER — Ambulatory Visit (INDEPENDENT_AMBULATORY_CARE_PROVIDER_SITE_OTHER)
Admission: RE | Admit: 2011-12-01 | Discharge: 2011-12-01 | Disposition: A | Discharge status: Home / Self Care | Payer: PRIVATE HEALTH INSURANCE | Source: Ambulatory Visit | Attending: Internal Medicine | Admitting: Internal Medicine

## 2011-12-01 DIAGNOSIS — R05 Cough: Secondary | ICD-10-CM

## 2011-12-01 IMAGING — CR DG CHEST 2V
2 series · 2 of 2 positions shown · non-contrast
Comparison: [DATE]

CLINICAL DATA: Follow up abnormal chest radiograph

CHEST - 2 VIEW

[view not recorded (1 of 2)]
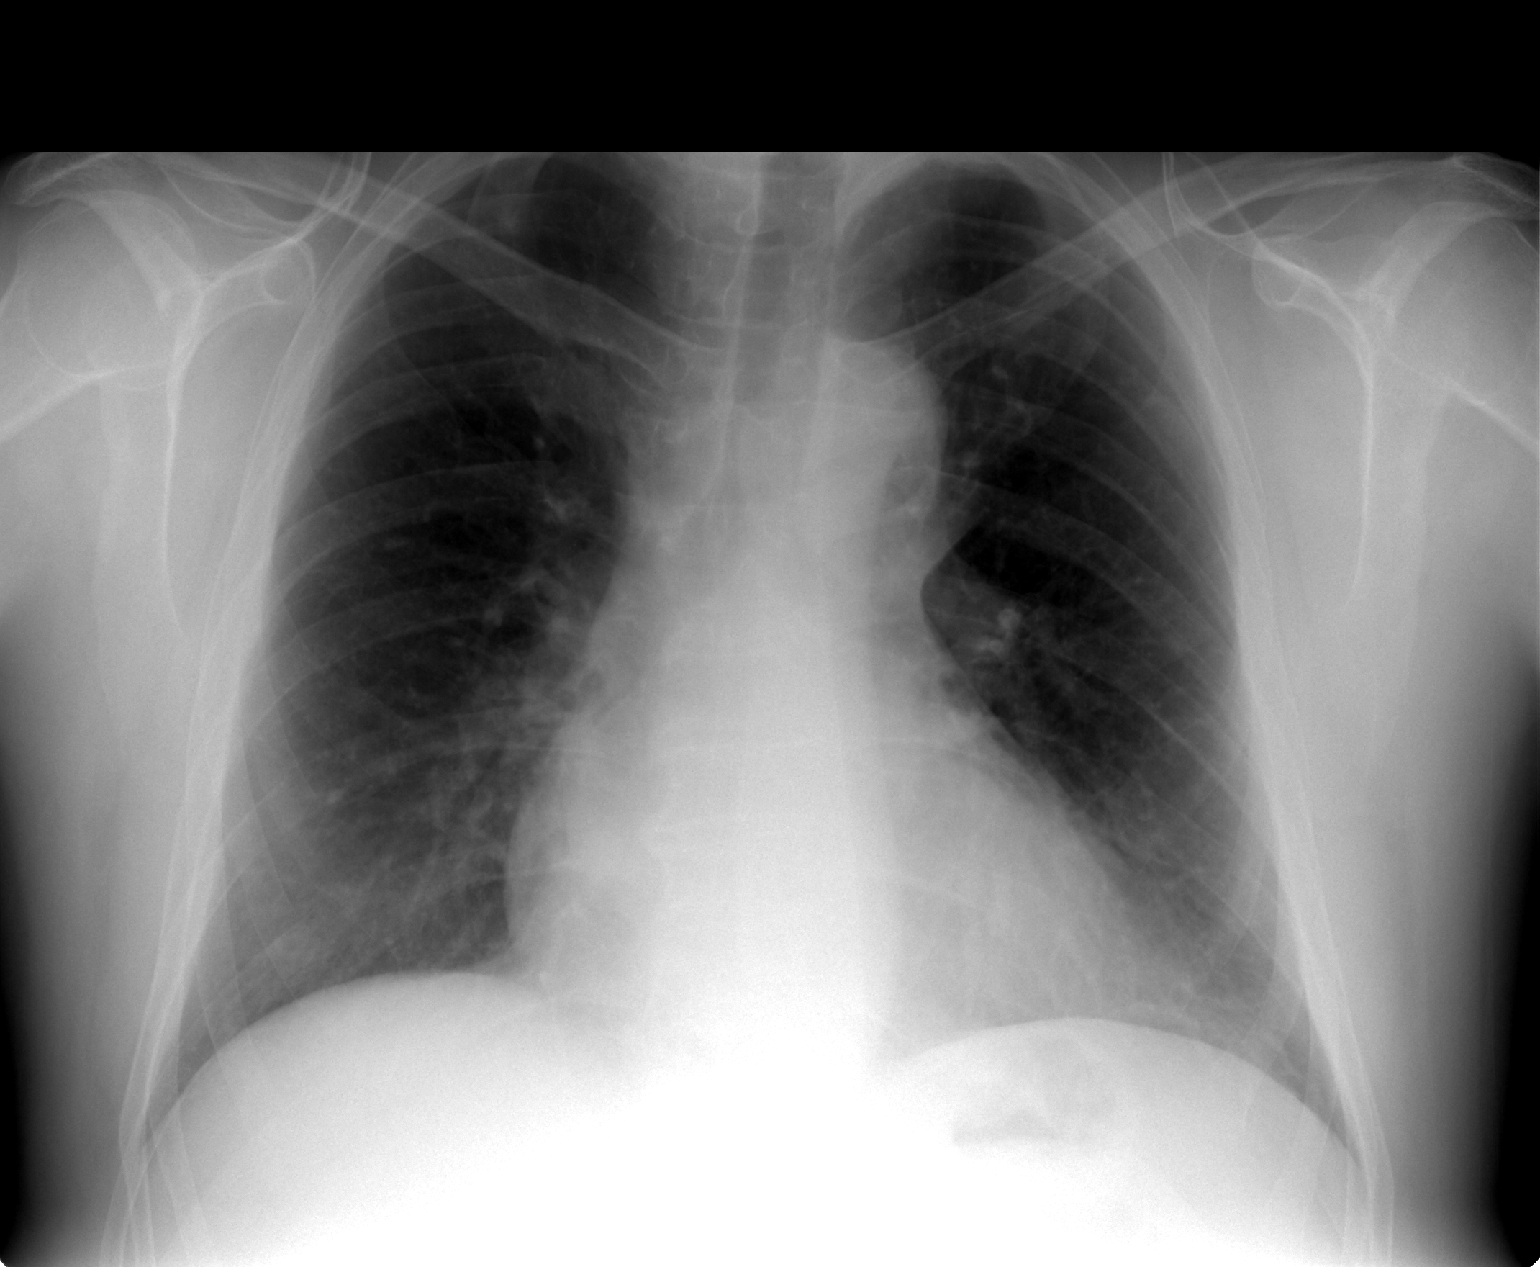

[view not recorded (2 of 2)]
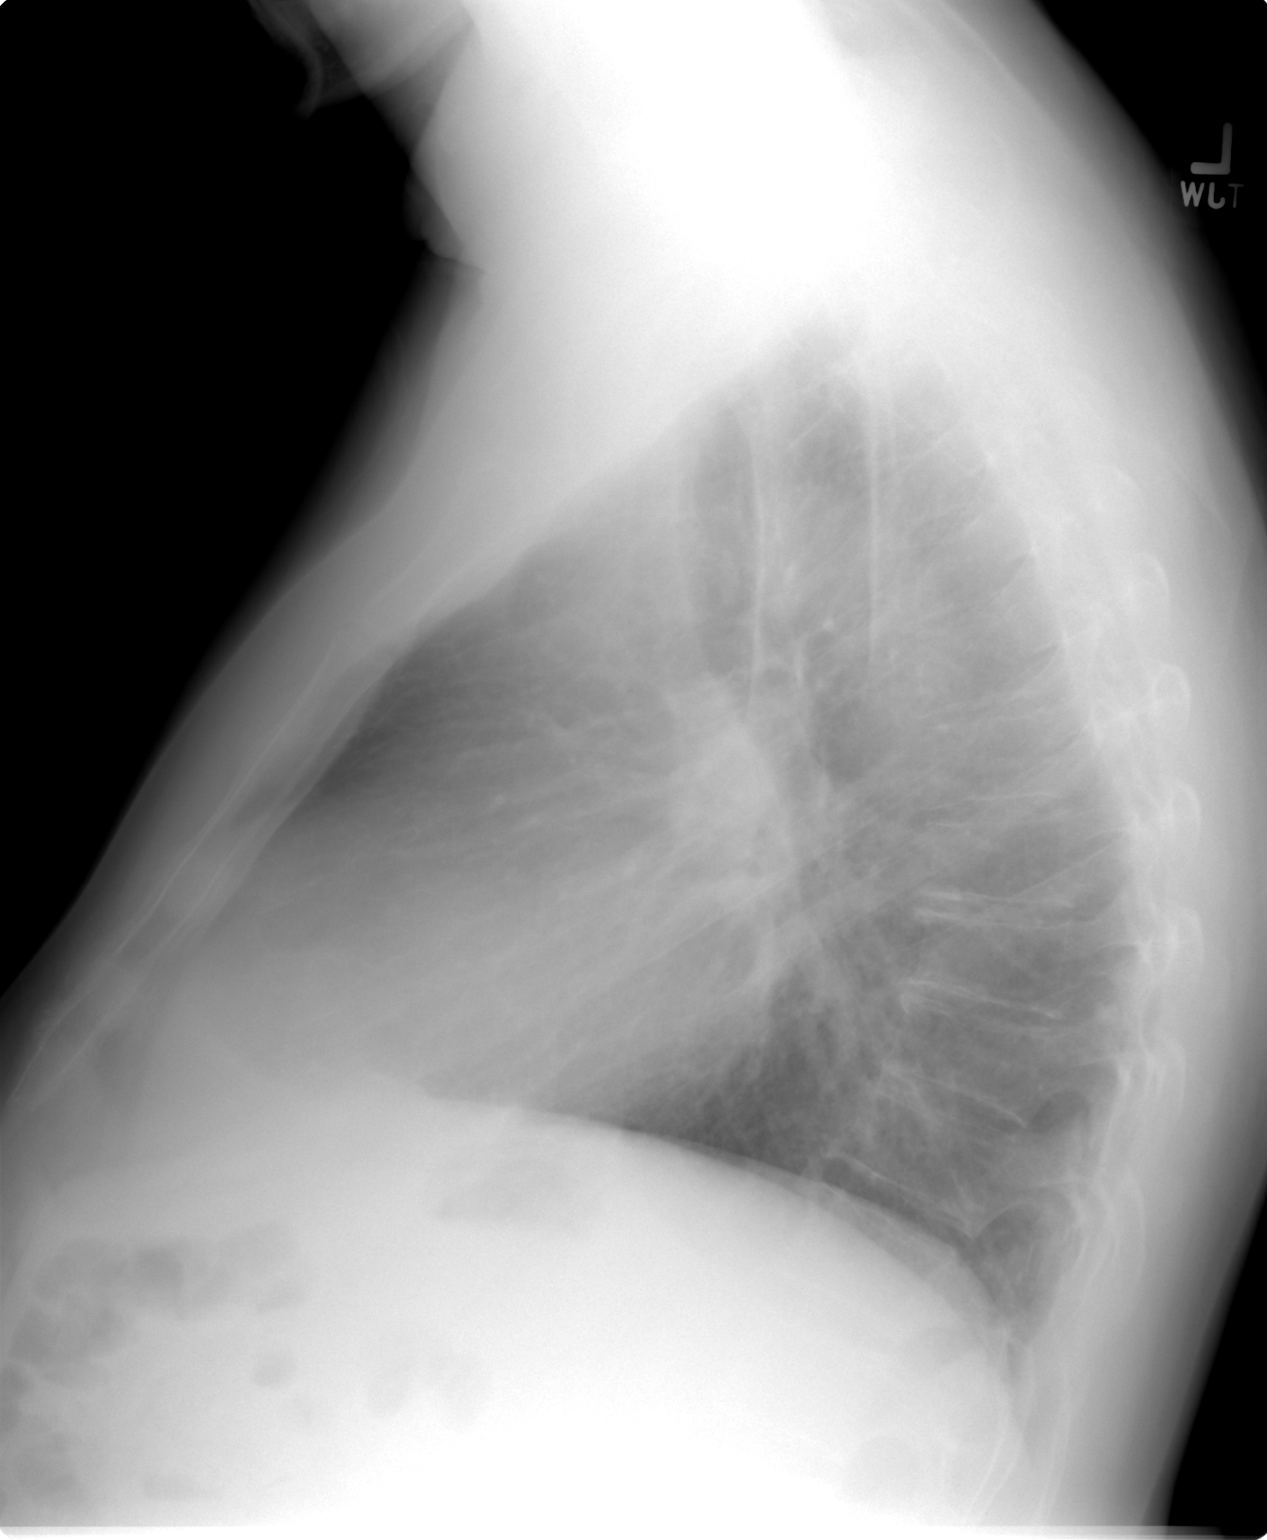

[2 of 2 positions shown; findings below may reference images not displayed]

FINDINGS: Heart size and mediastinal contours appear normal.

There is no pleural effusion or edema.

No airspace consolidation.  Stable subpleural nodules within the
right lung which are favored to represent calcified granulomas.  No
new findings identified.
IMPRESSION: 1.  Stable small nodules within the right lung. Likely granulomas.

## 2011-12-01 NOTE — Telephone Encounter (Signed)
Pt walk-in to office c/o SOB x1 wk; upon further assessment w/vitals [all normal-O2 98%], no chest/arm pain; no nausea and/or vomiting, patient appears to have sinus & respiratory issues relating to congestion and was advised to make appointment w/PCP before leaving office/SLS

## 2011-12-02 NOTE — Telephone Encounter (Signed)
Order for chest Xray per JAW; f/u OV scheduled for 05.08.13/SLS

## 2011-12-03 ENCOUNTER — Ambulatory Visit: Payer: PRIVATE HEALTH INSURANCE | Admitting: Internal Medicine

## 2011-12-03 DIAGNOSIS — Z0289 Encounter for other administrative examinations: Secondary | ICD-10-CM

## 2011-12-16 ENCOUNTER — Telehealth: Payer: Self-pay | Admitting: Internal Medicine

## 2011-12-16 NOTE — Telephone Encounter (Signed)
Patient is needing dermatology appointment for a mole and rash.

## 2011-12-16 NOTE — Telephone Encounter (Signed)
No. Pt no-showed for appt after I ordered a CXR without seeing him. This is taking advantage of our office. He needs to schedule a visit if he wants [place] to set up referral.  You can read this to him.

## 2011-12-29 ENCOUNTER — Telehealth: Payer: Self-pay | Admitting: Internal Medicine

## 2011-12-29 NOTE — Telephone Encounter (Signed)
hosptial follow up 6/17 discharged from [facility] 12/29/11

## 2011-12-31 NOTE — Telephone Encounter (Signed)
Left message on cell phone voicemail for patient to return call. 

## 2012-01-02 ENCOUNTER — Telehealth: Payer: Self-pay | Admitting: Internal Medicine

## 2012-01-02 NOTE — Telephone Encounter (Signed)
Patient does not want to wait until June 17 to be seen because he is not feeling his best.

## 2012-01-02 NOTE — Telephone Encounter (Signed)
Left another message asking patient to call back

## 2012-01-02 NOTE — Telephone Encounter (Signed)
Patient was discharged from [facility] on Monday night. He went in because his heart was fluttering, blood pressure was really high. He was put on blood thinner and BP meds. He has a follow up coming up with them in a week or two. He says that he feels like he he is still having issues with his heart fluttering. He is asking if you would be able to work him in sooner than the 17th.

## 2012-01-02 NOTE — Telephone Encounter (Signed)
Left message asking patient to call back

## 2012-01-02 NOTE — Telephone Encounter (Signed)
We can put him in next slot. Need to get all records from [place].

## 2012-01-02 NOTE — Telephone Encounter (Signed)
Patient is aware that the 17th is the earliest appointment we have for a new patient. He is fine with waiting until then.

## 2012-01-05 NOTE — Telephone Encounter (Signed)
Have left several messages. We will wait to hear back from patient

## 2012-01-12 ENCOUNTER — Ambulatory Visit (INDEPENDENT_AMBULATORY_CARE_PROVIDER_SITE_OTHER): Payer: PRIVATE HEALTH INSURANCE | Admitting: Internal Medicine

## 2012-01-12 ENCOUNTER — Encounter: Payer: Self-pay | Admitting: Internal Medicine

## 2012-01-12 VITALS — BP 124/80 | HR 82 | Temp 98.7°F | Ht 72.0 in | Wt 205.2 lb

## 2012-01-12 DIAGNOSIS — L259 Unspecified contact dermatitis, unspecified cause: Secondary | ICD-10-CM

## 2012-01-12 DIAGNOSIS — L309 Dermatitis, unspecified: Secondary | ICD-10-CM

## 2012-01-12 DIAGNOSIS — R5381 Other malaise: Secondary | ICD-10-CM

## 2012-01-12 DIAGNOSIS — I519 Heart disease, unspecified: Secondary | ICD-10-CM

## 2012-01-12 DIAGNOSIS — R5383 Other fatigue: Secondary | ICD-10-CM | POA: Insufficient documentation

## 2012-01-12 DIAGNOSIS — G4700 Insomnia, unspecified: Secondary | ICD-10-CM | POA: Insufficient documentation

## 2012-01-12 DIAGNOSIS — I4891 Unspecified atrial fibrillation: Secondary | ICD-10-CM | POA: Insufficient documentation

## 2012-01-12 DIAGNOSIS — I10 Essential (primary) hypertension: Secondary | ICD-10-CM

## 2012-01-12 MED ORDER — TRIAMCINOLONE ACETONIDE 0.025 % EX OINT: TOPICAL_OINTMENT | Freq: Two times a day (BID) | CUTANEOUS | Status: DC

## 2012-01-12 MED ORDER — LOSARTAN POTASSIUM 25 MG PO TABS: 25.0000 mg | ORAL_TABLET | Freq: Every day | ORAL | Status: DC

## 2012-01-12 NOTE — Assessment & Plan Note (Signed)
Currently in NSR. Anticoagulated with Xarelto. Rate controlled with Metoprolol. Will request notes from [facility]. Symptomatically fatigued, but improving. Follow up 1 month and prn.

## 2012-01-12 NOTE — Assessment & Plan Note (Signed)
Secondary to increased stress with job and recent health issues. Will restart Lunesta. Follow up 1 month.

## 2012-01-12 NOTE — Assessment & Plan Note (Signed)
On lisinopril,but having dry cough with this medication. Will change to Losartan. Will check renal function with labs in 1 week.

## 2012-01-12 NOTE — Progress Notes (Signed)
Subjective:    Patient ID: Jake Liu, male    DOB: 1961-03-30, 51 y.o.   MRN: [id number]  HPI 51 year old male presents for followup after recent diagnosis of atrial fibrillation and hospitalization at [facility]. He reports that approximately 2 weeks ago he began feeling poorly. He reports feeling dizzy and having some visual changes with blurred lines and some black spots in his vision. He drove himself to [facility], where he was found to have atrial fibrillation. He failed to convert to normal sinus rhythm with medication, so he was cardioverted. He was anticoagulated with Xarelto. He reports feeling fatigued since discharge from the hospital. He occasionally has episodes of palpitations, but attributes this to anxiety. At the recommendation of his doctors at [facility], he has reduced his wine intake to 1-2 glasses at night. He is also limiting coffee intake to 1 cup per day. He continues to have significant stress at work. He denies any recent chest pain. He denies shortness of breath. He does acknowledge significant fatigue since his hospitalization.  He also notes persistent rash over his lower chest. This rash is described as red and itching. He has been present intermittently for several years. He denies use of any new lotions or cream. He has recently been spending significant time in the sun and has some redness over his upper chest which she attributes to sun burn.  Outpatient Encounter Prescriptions as of 01/12/2012  Medication Sig Dispense Refill  . metoprolol succinate (TOPROL-XL) 50 MG 24 hr tablet Take 50 mg by mouth daily. Take with or immediately following a meal.      . Rivaroxaban (XARELTO) 20 MG TABS Take 1 tablet by mouth every evening.      . triamcinolone (KENALOG) 0.025 % ointment Apply topically 2 (two) times daily.  30 g  3  . Eszopiclone (ESZOPICLONE) 3 MG TABS Take 1 tablet (3 mg total) by mouth at bedtime as needed. Take immediately before bedtime   30 tablet  3  . losartan (COZAAR) 25 MG tablet Take 1 tablet (25 mg total) by mouth daily.  30 tablet  3   BP 124/80  Pulse 82  Temp 98.7 F (37.1 C) (Oral)  Ht 6' (1.829 m)  Wt 205 lb 4 oz (93.101 kg)  BMI 27.84 kg/m2  SpO2 98%  Review of Systems  Constitutional: Positive for fatigue. Negative for fever, chills, activity change, appetite change and unexpected weight change.  Eyes: Negative for visual disturbance.  Respiratory: Negative for cough and shortness of breath.   Cardiovascular: Positive for palpitations. Negative for chest pain and leg swelling.  Gastrointestinal: Negative for abdominal pain and abdominal distention.  Genitourinary: Negative for dysuria, urgency and difficulty urinating.  Musculoskeletal: Negative for arthralgias and gait problem.  Skin: Positive for rash. Negative for color change.  Hematological: Negative for adenopathy.  Psychiatric/Behavioral: Negative for disturbed wake/sleep cycle and dysphoric mood. The patient is not nervous/anxious.        Objective:   Physical Exam  Constitutional: He is oriented to person, place, and time. He appears well-developed and well-nourished. No distress.  HENT:  Head: Normocephalic and atraumatic.  Right Ear: External ear normal.  Left Ear: External ear normal.  Nose: Nose normal.  Mouth/Throat: Oropharynx is clear and moist. No oropharyngeal exudate.  Eyes: Conjunctivae and EOM are normal. Pupils are equal, round, and reactive to light. Right eye exhibits no discharge. Left eye exhibits no discharge. No scleral icterus.  Neck: Normal range of motion. Neck supple.  No tracheal deviation present. No thyromegaly present.  Cardiovascular: Normal rate, regular rhythm and normal heart sounds.  Exam reveals no gallop and no friction rub.   No murmur heard. Pulmonary/Chest: Effort normal and breath sounds normal. No respiratory distress. He has no wheezes. He has no rales. He exhibits no tenderness.  Musculoskeletal:  Normal range of motion. He exhibits no edema.  Lymphadenopathy:    He has no cervical adenopathy.  Neurological: He is alert and oriented to person, place, and time. No cranial nerve deficit. Coordination normal.  Skin: Skin is warm and dry. Rash (lower abdomen, consistent with dermatitis) noted. He is not diaphoretic. There is erythema. No pallor.  Psychiatric: He has a normal mood and affect. His behavior is normal. Judgment and thought content normal.          Assessment & Plan:

## 2012-01-12 NOTE — Assessment & Plan Note (Signed)
Multifactorial. Recent diagnosis AFib. Major lifestyle change with reduction in caffeine and alcohol intake, but persistent stressors from work. Will check CBC, CMP, TSH with labs next week. May consider sleep study in future if symptoms persistent.

## 2012-01-14 ENCOUNTER — Telehealth: Payer: Self-pay | Admitting: *Deleted

## 2012-01-14 NOTE — Telephone Encounter (Signed)
Message copied by Jobie Quaker on Wed Jan 14, 2012 11:53 AM ------      Message from: Ronna Polio A      Created: Mon Jan 12, 2012  8:45 PM      Regarding: Follow up Labs       I would like for him to have CMP,CBC, TSH next week.

## 2012-01-14 NOTE — Telephone Encounter (Signed)
Message copied by Jobie Quaker on Wed Jan 14, 2012 11:54 AM ------      Message from: Ronna Polio A      Created: Mon Jan 12, 2012  8:45 PM      Regarding: Follow up Labs       I would like for him to have CMP,CBC, TSH next week.

## 2012-01-14 NOTE — Telephone Encounter (Signed)
Left message asking patient to call and schedule lab appt

## 2012-01-16 ENCOUNTER — Emergency Department (HOSPITAL_COMMUNITY): Payer: No Typology Code available for payment source

## 2012-01-16 ENCOUNTER — Other Ambulatory Visit: Payer: Self-pay

## 2012-01-16 ENCOUNTER — Telehealth: Payer: Self-pay | Admitting: Internal Medicine

## 2012-01-16 ENCOUNTER — Encounter (HOSPITAL_COMMUNITY): Payer: Self-pay | Admitting: Emergency Medicine

## 2012-01-16 ENCOUNTER — Emergency Department (HOSPITAL_COMMUNITY)
Admission: EM | Admit: 2012-01-16 | Discharge: 2012-01-16 | Disposition: A | Discharge status: Home / Self Care | Payer: No Typology Code available for payment source | Attending: Emergency Medicine | Admitting: Emergency Medicine

## 2012-01-16 DIAGNOSIS — Z7901 Long term (current) use of anticoagulants: Secondary | ICD-10-CM | POA: Insufficient documentation

## 2012-01-16 DIAGNOSIS — Z888 Allergy status to other drugs, medicaments and biological substances status: Secondary | ICD-10-CM | POA: Insufficient documentation

## 2012-01-16 DIAGNOSIS — I4891 Unspecified atrial fibrillation: Secondary | ICD-10-CM | POA: Insufficient documentation

## 2012-01-16 DIAGNOSIS — R002 Palpitations: Secondary | ICD-10-CM | POA: Insufficient documentation

## 2012-01-16 DIAGNOSIS — Z882 Allergy status to sulfonamides status: Secondary | ICD-10-CM | POA: Insufficient documentation

## 2012-01-16 DIAGNOSIS — Z79899 Other long term (current) drug therapy: Secondary | ICD-10-CM | POA: Insufficient documentation

## 2012-01-16 IMAGING — CR DG CHEST 2V
2 series · 2 of 2 positions shown · non-contrast
Comparison: [DATE]

CLINICAL DATA: Chest pain.

CHEST - 2 VIEW

[w chest pa]
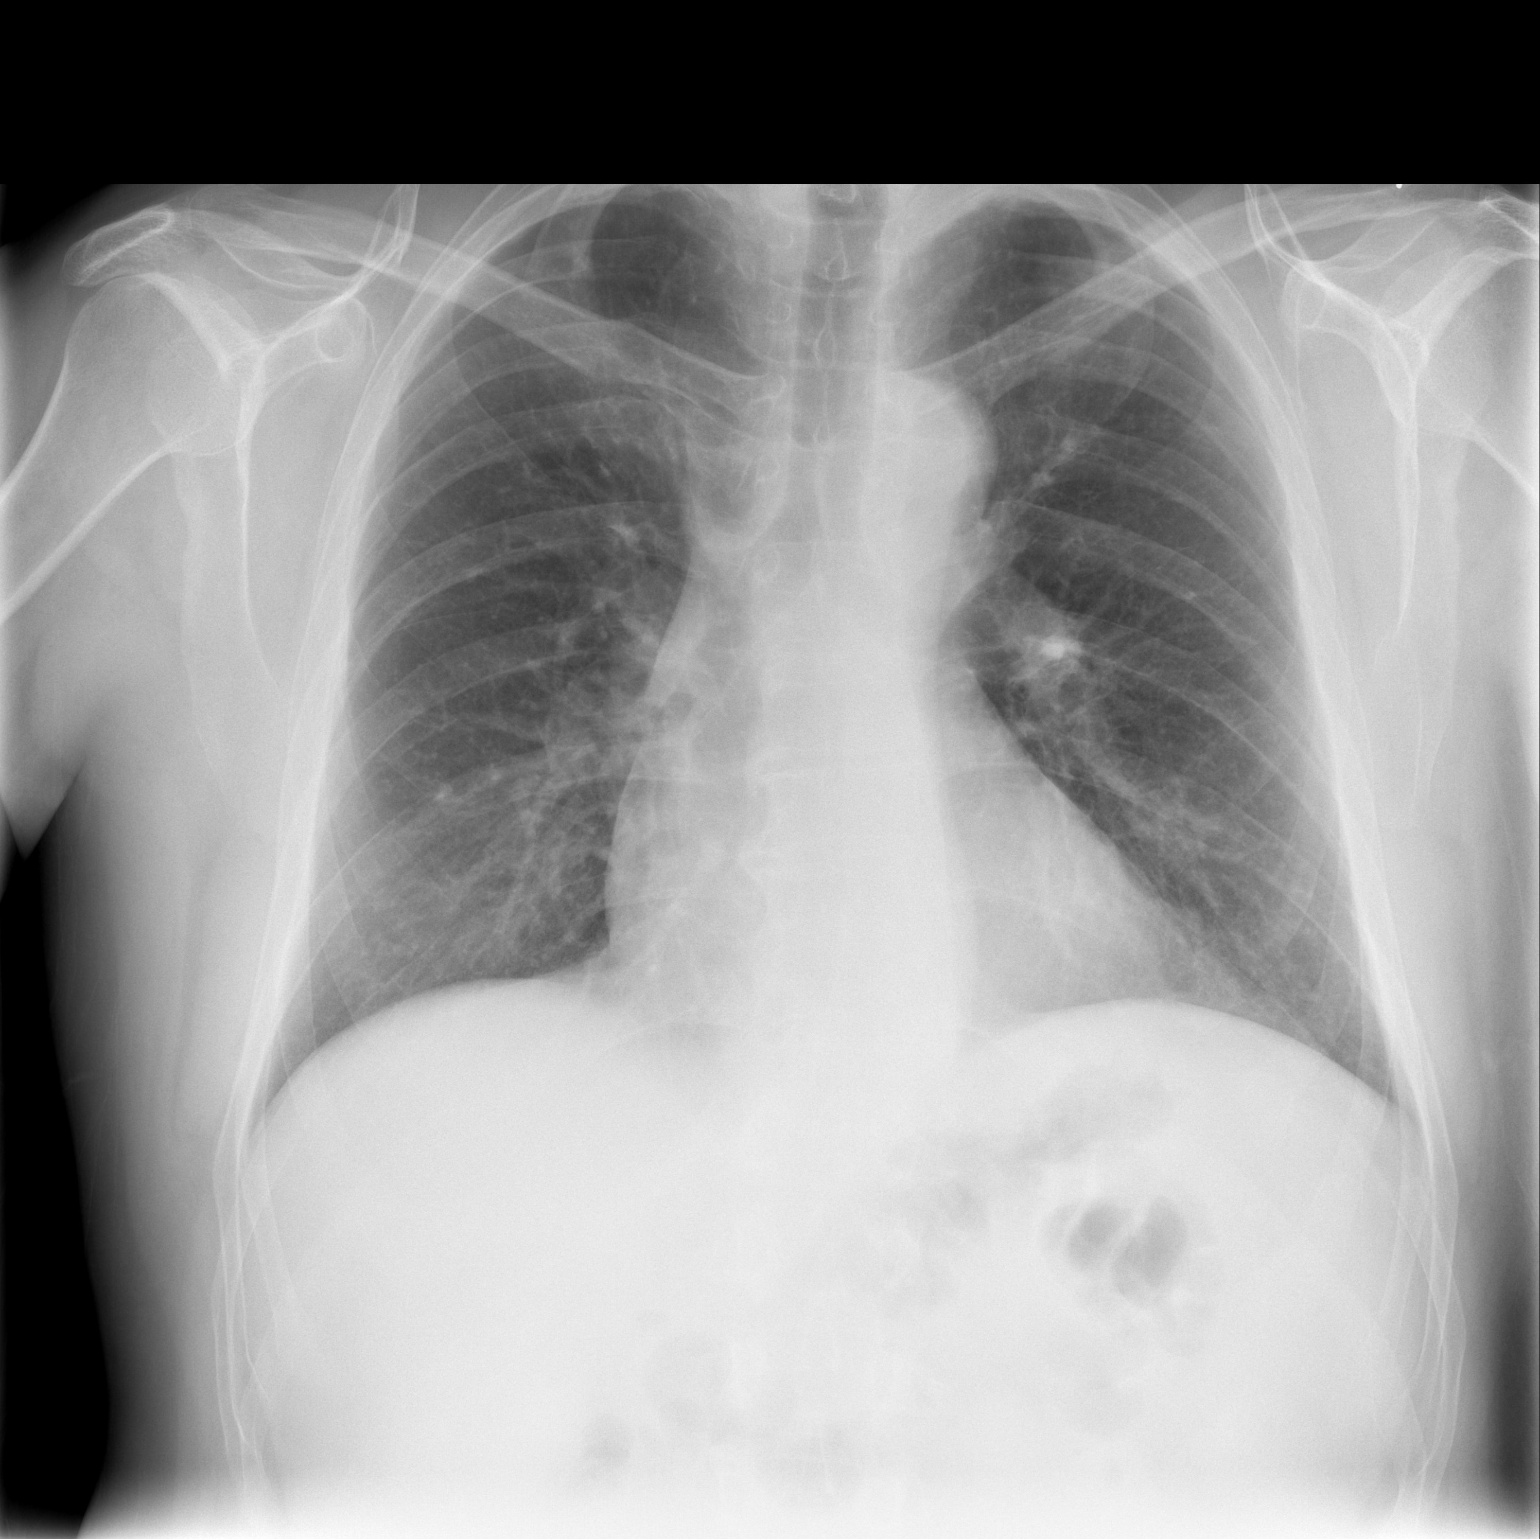

[w chest lat]
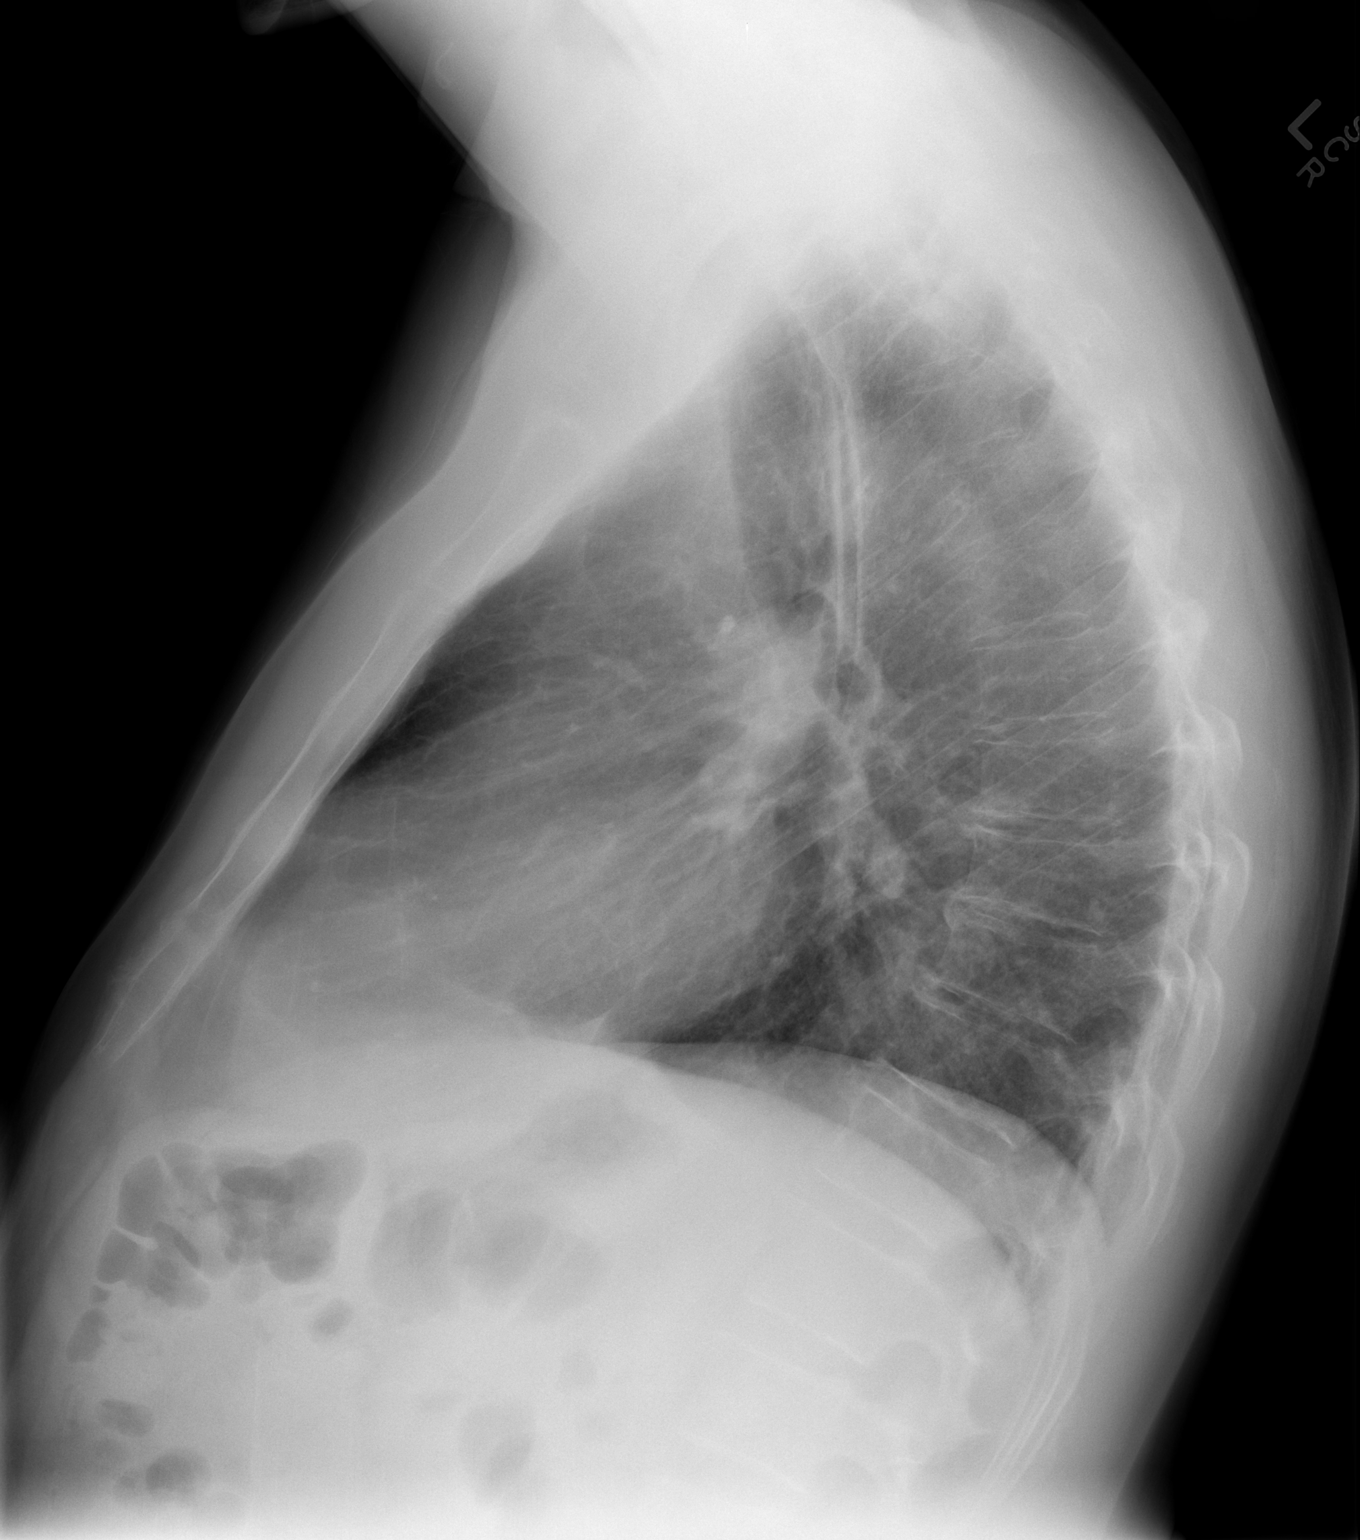

[2 of 2 positions shown; findings below may reference images not displayed]

FINDINGS: The cardiomediastinal silhouette is unremarkable.
Mild peribronchial thickening is unchanged.
There is no evidence of focal airspace disease, pulmonary edema,
suspicious pulmonary nodule/mass, pleural effusion, or
pneumothorax.
No acute bony abnormalities are identified.
IMPRESSION: No evidence of acute cardiopulmonary disease.

## 2012-01-16 NOTE — ED Notes (Signed)
Pt refused to get blood drawn. Nurse notified. CT

## 2012-01-16 NOTE — Telephone Encounter (Signed)
Patient returned call and was notified, he is coming in next week for lab appt

## 2012-01-16 NOTE — Telephone Encounter (Signed)
Caller: Sydney/Mother; PCP: Ronna Polio; CB#: [phone]; ; ; Call regarding Heart Is Racing;  Pt calling regarding heart fluttering and chest "tightness" for the last 1 hr and feels weak. Pt cardioverted 3 weeks ago for Afib. Denies any other sxs or cardiac problems. Pulse is 87. NA at office Backline-[phone]. Consulted with Dr. Sharen Hones: advised to have pt go to closest Hospital. [facility]- he is already in the car driving. Discouraged this strongly. Pt states he is "fine". Still discouraged and asked pt to pull over and dial 911 at any time with any worsening of sxs: sob, chest pain, dizziness, etc. Pt verbalized understanding and on the way to the ER.

## 2012-01-16 NOTE — ED Provider Notes (Signed)
History     CSN: [id number]  Arrival date & time 01/16/12  1754   First MD Initiated Contact with Patient 01/16/12 2034      Chief Complaint  Patient presents with  . Palpitations  . Chest Pain     HPI This gentleman presents with concerns of palpitations that occurred in the hours prior to arrival.  Notably, the patient was diagnosed with atrial fibrillation approximately one month ago at [facility].  He was admitted, had evaluation including echocardiogram, labs.  He was cardioverted and anticoagulated.  He notes that since that time he has been compliant with his medication, including his Rivaroxaban.  He states that yesterday he was out with clamps, had several alcoholic beverages.  Today, May the day, he describes a subacute onset of generalized chest discomfort without true pain, without any syncope, or any vomiting, without any confusion, without any dyspnea.  Symptoms soft and spontaneously.  While he was symptomatically spoke with his physician and was referred here for evaluation.  On arrival he states that all his complaints stopped, and he wants an ECG to assure himself that he is not in atrial fibrillation. Past Medical History  Diagnosis Date  . Sciatica   . Atrial fibrillation     History reviewed. No pertinent past surgical history.  History reviewed. No pertinent family history.  History  Substance Use Topics  . Smoking status: Never Smoker   . Smokeless tobacco: Never Used  . Alcohol Use: 3.5 oz/week    7 drink(s) per week     drinks at night.      Review of Systems  Constitutional:       Per HPI, otherwise negative  HENT:       Per HPI, otherwise negative  Eyes: Negative.   Respiratory:       Per HPI, otherwise negative  Cardiovascular:       Per HPI, otherwise negative  Gastrointestinal: Negative for vomiting.  Genitourinary: Negative.   Musculoskeletal:       Per HPI, otherwise negative  Skin: Negative.   Neurological: Negative for syncope.     Allergies  Bactrim and Lisinopril  Home Medications   Current Outpatient Rx  Name Route Sig Dispense Refill  . ESZOPICLONE 3 MG PO TABS Oral Take 1 tablet (3 mg total) by mouth at bedtime as needed. Take immediately before bedtime 30 tablet 3  . LOSARTAN POTASSIUM 25 MG PO TABS Oral Take 1 tablet (25 mg total) by mouth daily. 30 tablet 3  . RIVAROXABAN 20 MG PO TABS Oral Take 1 tablet by mouth every evening.    . TRIAMCINOLONE ACETONIDE 0.025 % EX OINT Topical Apply topically 2 (two) times daily. 30 g 3    BP 142/88  Pulse 73  Temp 97.4 F (36.3 C) (Oral)  Resp 14  SpO2 96%  Physical Exam  Nursing note and vitals reviewed. Constitutional: He is oriented to person, place, and time. He appears well-developed. No distress.  HENT:  Head: Normocephalic and atraumatic.  Eyes: Conjunctivae and EOM are normal.  Cardiovascular: Normal rate and regular rhythm.   Pulmonary/Chest: Effort normal. No stridor. No respiratory distress.  Abdominal: He exhibits no distension.  Musculoskeletal: He exhibits no edema.  Neurological: He is alert and oriented to person, place, and time.  Skin: Skin is warm and dry.  Psychiatric: He has a normal mood and affect.    ED Course  Procedures (including critical care time)   Labs Reviewed  CBC  BASIC METABOLIC  PANEL  PROTIME-INR   Dg Chest 2 View  01/16/2012  *RADIOLOGY REPORT*  Clinical Data: Chest pain.  CHEST - 2 VIEW  Comparison: 12/01/2011  Findings: The cardiomediastinal silhouette is unremarkable. Mild peribronchial thickening is unchanged. There is no evidence of focal airspace disease, pulmonary edema, suspicious pulmonary nodule/mass, pleural effusion, or pneumothorax. No acute bony abnormalities are identified.  IMPRESSION: No evidence of acute cardiopulmonary disease.  Original Report Authenticated By: Rosendo Gros, M.D.     1. Palpitations     Cardiac 75 sinus rhythm normal Pulse oximetry 100% normal   Date: 01/16/2012   Rate: 75  Rhythm: normal sinus rhythm  QRS Axis: normal  Intervals: normal  ST/T Wave abnormalities: normal  Conduction Disutrbances:none  Narrative Interpretation:   Old EKG Reviewed: none available LVH, but otherwise unremarkable.  MDM  This 51 year old male presents with concerns over atrial fibrillation.  Notably, on arrival the patient is asymptomatic.  The patient's vital signs are unremarkable, his physical exam reassuring, and his ECG nonischemic, non-dysrhythmic.  I discussed the need for complete evaluation with the patient, including laboratory studies.  The patient defers this evaluation, and given his denial of complaints, we agreed that he would not have labs done, after he acknowledged the risks of not doing so.  Given the patient's endorsement compliance with his anticoagulant, there is low suspicion for acute pathology, even if the patient had an episode of atrial fibrillation.  I made patient aware of all clots, the need for continued monitoring with his primary care physician.  The patient's request for discharge was accommodated.      Gerhard Munch, MD 01/16/12 2136

## 2012-01-16 NOTE — Discharge Instructions (Signed)
As discussed, if you develop any new, or concerning changes in your condition, please be sure to follow up with your physician immediately.  He may also return immediately to the emergency department if this is faster.

## 2012-01-16 NOTE — Telephone Encounter (Signed)
Mr Skilling called to let you know that [facility] has not called him back for follow up cardology appointment Pt is not feeling well. Sent to call a nurse

## 2012-01-16 NOTE — ED Notes (Signed)
Pt c/o palpitations and chest tightness today that has now resolved; pt sts some generalized weakness today; pt sts hx of cardioversion for afib recently

## 2012-01-16 NOTE — ED Notes (Signed)
Pt states he had episode of fast heart beat earlier today, called his cardiologist and was told to come here for EKG.  Refuses blood work, states he does not feel heart racing anymore.  Denies pain, alert and oriented x 3.

## 2012-01-16 NOTE — ED Notes (Signed)
Rx x 0.  Pt voiced understanding to f/u with PCP and return for worsening condition.  

## 2012-01-16 NOTE — Telephone Encounter (Signed)
If he would like, I can set him up with Dr. Mariah Milling or Kirke Corin here for care. Might be easier to have cardiologist close by.

## 2012-01-19 NOTE — Telephone Encounter (Signed)
Patient advised as instructed via telephone, he stated that he would like to see a Warden/ranger.  He will wait to hear from [facility] regarding referral.

## 2012-01-19 NOTE — Telephone Encounter (Signed)
Can you find out if he was seen?

## 2012-01-19 NOTE — Telephone Encounter (Signed)
Patient was seen at [facility] on Friday night, was told his BP was high.  They did an EKG and everything was normal.  Patient stated that they didn't change any of his medications.  They did a chest xray also and it was normal as well.  Please advise.

## 2012-01-19 NOTE — Telephone Encounter (Signed)
OK. If he would like referral to local cardiologist please let me know.

## 2012-01-21 ENCOUNTER — Other Ambulatory Visit (INDEPENDENT_AMBULATORY_CARE_PROVIDER_SITE_OTHER): Payer: PRIVATE HEALTH INSURANCE | Admitting: *Deleted

## 2012-01-21 DIAGNOSIS — Z79899 Other long term (current) drug therapy: Secondary | ICD-10-CM

## 2012-01-21 LAB — CBC WITH DIFFERENTIAL/PLATELET
Basophils Absolute: 0.1 10*3/uL (ref 0.0–0.1)
Eosinophils Absolute: 0.2 10*3/uL (ref 0.0–0.7)
Hemoglobin: 14.4 g/dL (ref 13.0–17.0)
Lymphocytes Relative: 26.9 % (ref 12.0–46.0)
Lymphs Abs: 1.3 10*3/uL (ref 0.7–4.0)
MCHC: 33.5 g/dL (ref 30.0–36.0)
MCV: 91.8 fl (ref 78.0–100.0)
Monocytes Absolute: 0.5 10*3/uL (ref 0.1–1.0)
Neutro Abs: 2.6 10*3/uL (ref 1.4–7.7)
RDW: 13.3 % (ref 11.5–14.6)

## 2012-01-21 LAB — COMPREHENSIVE METABOLIC PANEL
ALT: 48 U/L (ref 0–53)
AST: 38 U/L — ABNORMAL HIGH (ref 0–37)
Alkaline Phosphatase: 42 U/L (ref 39–117)
Calcium: 9.2 mg/dL (ref 8.4–10.5)
Chloride: 102 mEq/L (ref 96–112)
Creatinine, Ser: 0.6 mg/dL (ref 0.4–1.5)
Potassium: 4 mEq/L (ref 3.5–5.1)

## 2012-01-22 ENCOUNTER — Ambulatory Visit (INDEPENDENT_AMBULATORY_CARE_PROVIDER_SITE_OTHER): Payer: PRIVATE HEALTH INSURANCE | Admitting: Cardiovascular Disease

## 2012-01-22 ENCOUNTER — Encounter: Payer: Self-pay | Admitting: Cardiovascular Disease

## 2012-01-22 ENCOUNTER — Other Ambulatory Visit: Payer: PRIVATE HEALTH INSURANCE

## 2012-01-22 VITALS — BP 112/86 | HR 84 | Ht 72.0 in | Wt 205.2 lb

## 2012-01-22 DIAGNOSIS — R079 Chest pain, unspecified: Secondary | ICD-10-CM

## 2012-01-22 DIAGNOSIS — I429 Cardiomyopathy, unspecified: Secondary | ICD-10-CM | POA: Insufficient documentation

## 2012-01-22 DIAGNOSIS — I428 Other cardiomyopathies: Secondary | ICD-10-CM

## 2012-01-22 DIAGNOSIS — I4891 Unspecified atrial fibrillation: Secondary | ICD-10-CM

## 2012-01-22 NOTE — Assessment & Plan Note (Signed)
The patient's chest pain is overall atypical. His risk factors include age, gender and nicotine use. Although he does not smoke cigarettes, he does smoke cigars. Recommend evaluation with a treadmill nuclear stress test. The imaging part is needed to reevaluate his ejection fraction.

## 2012-01-22 NOTE — Patient Instructions (Addendum)
Your physician has requested that you have en exercise stress myoview. For further information please visit [email or web address] Please follow instruction sheet, as given.  Do not take Metoprolol before the stress test.   Follow up after stress test

## 2012-01-22 NOTE — Progress Notes (Signed)
HPI  [name] is a pleasant 51 year old male who is here today to establish cardiovascular care. He was referred by Dr. Dan Humphreys. The patient was in his usual health with no chronic medical conditions when he presented early this month with palpitations associated with blurred vision and dizziness. He drove himself to [facility] where he was found to be in atrial fibrillation with rapid ventricular response. His heart rate 180 beats per minute. He had no previous arrhythmia prior to that. He had an echocardiogram done and was told that his ejection fraction was 45%. His ventricular rate was difficult to control and ultimately he underwent TEE guided cardioversion successfully. He was placed on Xarelto for anticoagulation. It was felt that his atrial fibrillation was possibly triggered by excessive caffeine and alcohol intake. The patient has an overall stressful job and travels a lot. He is always on the go and usually drinks at least 6 cups of strong coffee a day. He drinks a minimal to 3 glasses of wine every night. Since his hospitalization, he was asked to cut down on both. He is currently drinking 2 cups of coffee a day. He has not had any recurrent arrhythmia. He does complain of feeling tired and having no energy. He had few episodes of chest discomfort at rest as well but not exertional. [facility] for dizziness and fatigue. He was found to be in normal sinus rhythm.  He was discharged home on small dose Toprol and lisinopril. He developed cough and was switched from lisinopril to losartan. He has no prior history of stroke, diabetes, or congestive heart failure.  Allergies  Allergen Reactions  . Bactrim (Sulfamethoxazole-Tmp Ds)     sulfa  . Lisinopril     cough     Current Outpatient Prescriptions on File Prior to Visit  Medication Sig Dispense Refill  . Eszopiclone (ESZOPICLONE) 3 MG TABS Take 1 tablet (3 mg total) by mouth at  bedtime as needed. Take immediately before bedtime  30 tablet  3  . losartan (COZAAR) 25 MG tablet Take 1 tablet (25 mg total) by mouth daily.  30 tablet  3  . Rivaroxaban (XARELTO) 20 MG TABS Take 1 tablet by mouth every evening.      . triamcinolone (KENALOG) 0.025 % ointment Apply topically 2 (two) times daily.  30 g  3     Past Medical History  Diagnosis Date  . Sciatica   . Atrial fibrillation   . Cardiomyopathy     EF: 45%     Past Surgical History  Procedure Date  . Knee surgery      Family History  Problem Relation Age of Onset  . Heart disease Mother 64  . Asthma Father      History   Social History  . Marital Status: Single    Spouse Name: N/A    Number of Children: N/A  . Years of Education: N/A   Occupational History  . Not on file.   Social History Main Topics  . Smoking status: Never Smoker   . Smokeless tobacco: Never Used  . Alcohol Use: 3.5 oz/week    7 drink(s) per week     drinks at night.  Drinks 10 cups coffee daily  . Drug Use: No  . Sexually Active: Not on file   Other Topics Concern  . Not on file   Social History Narrative  . No narrative on file  ROS Constitutional: Negative for fever, chills, diaphoresis, activity change, appetite change and fatigue.  HENT: Negative for hearing loss, nosebleeds, congestion, sore throat, facial swelling, drooling, trouble swallowing, neck pain, voice change, sinus pressure and tinnitus.  Eyes: Negative for photophobia, pain, discharge and visual disturbance.  Respiratory: Negative for apnea, cough and wheezing.  Cardiovascular: Negative for leg swelling.  Gastrointestinal: Negative for nausea, vomiting, abdominal pain, diarrhea, constipation, blood in stool and abdominal distention.  Genitourinary: Negative for dysuria, urgency, frequency, hematuria and decreased urine volume.  Musculoskeletal: Negative for myalgias, back pain, joint swelling, arthralgias and gait problem.  Skin: Negative  for color change, pallor, rash and wound.  Neurological: Negative for dizziness, tremors, seizures, syncope, speech difficulty, weakness, light-headedness, numbness and headaches.  Psychiatric/Behavioral: Negative for suicidal ideas, hallucinations, behavioral problems and agitation. The patient is not nervous/anxious.     PHYSICAL EXAM   BP 112/86  Pulse 84  Ht 6' (1.829 m)  Wt 205 lb 4 oz (93.101 kg)  BMI 27.84 kg/m2 Constitutional: He is oriented to person, place, and time. He appears well-developed and well-nourished. No distress.  HENT: No nasal discharge.  Head: Normocephalic and atraumatic.  Eyes: Pupils are equal and round. Right eye exhibits no discharge. Left eye exhibits no discharge.  Neck: Normal range of motion. Neck supple. No JVD present. No thyromegaly present.  Cardiovascular: Normal rate, regular rhythm, normal heart sounds. Exam reveals no gallop and no friction rub. No murmur heard.  Pulmonary/Chest: Effort normal and breath sounds normal. No stridor. No respiratory distress. He has no wheezes. He has no rales. He exhibits no tenderness.  Abdominal: Soft. Bowel sounds are normal. He exhibits no distension. There is no tenderness. There is no rebound and no guarding.  Musculoskeletal: Normal range of motion. He exhibits no edema and no tenderness.  Neurological: He is alert and oriented to person, place, and time. Coordination normal.  Skin: Skin is warm and dry. No rash noted. He is not diaphoretic. No erythema. No pallor.  Psychiatric: He has a normal mood and affect. His behavior is normal. Judgment and thought content normal.       EKG: Normal sinus rhythm with borderline criteria for left ventricular hypertrophy.   ASSESSMENT AND PLAN

## 2012-01-22 NOTE — Assessment & Plan Note (Signed)
I suspect that his mildly reduced LV systolic function was likely due to atrial fibrillation.  This will be reevaluated with a nuclear stress test. His ejection fraction is normal, losartan can likely be discontinued.

## 2012-01-22 NOTE — Assessment & Plan Note (Signed)
He is currently in normal sinus rhythm. His chads score is 0. However, he is status post cardioversion. Thus a minimum of 4 weeks of anticoagulation is recommended. He has no history of hypertension. He is on metoprolol and losartan for cardiomyopathy. He cut down in his caffeine intake significantly. His symptoms of fatigue and having no energy could represent caffeine withdrawal. If he develops recurrent atrial fibrillation, management options at that time would include an antiarrhythmic medication or catheter ablation.

## 2012-01-27 ENCOUNTER — Ambulatory Visit: Payer: Self-pay | Admitting: Cardiovascular Disease

## 2012-01-27 DIAGNOSIS — R079 Chest pain, unspecified: Secondary | ICD-10-CM

## 2012-02-02 ENCOUNTER — Encounter: Payer: Self-pay | Admitting: Cardiovascular Disease

## 2012-02-02 ENCOUNTER — Other Ambulatory Visit: Payer: Self-pay | Admitting: Cardiovascular Disease

## 2012-02-02 ENCOUNTER — Ambulatory Visit (INDEPENDENT_AMBULATORY_CARE_PROVIDER_SITE_OTHER): Payer: PRIVATE HEALTH INSURANCE | Admitting: Cardiovascular Disease

## 2012-02-02 VITALS — BP 130/74 | HR 62 | Ht 72.0 in | Wt 204.8 lb

## 2012-02-02 DIAGNOSIS — I429 Cardiomyopathy, unspecified: Secondary | ICD-10-CM

## 2012-02-02 DIAGNOSIS — R079 Chest pain, unspecified: Secondary | ICD-10-CM

## 2012-02-02 DIAGNOSIS — I428 Other cardiomyopathies: Secondary | ICD-10-CM

## 2012-02-02 DIAGNOSIS — I4891 Unspecified atrial fibrillation: Secondary | ICD-10-CM

## 2012-02-02 NOTE — Assessment & Plan Note (Signed)
He continues to maintain in normal sinus rhythm. His chads score is 0.He already received 5 weeks of anticoagulation post cardioversion. Thus, I recommend stopping Xarelto and starting aspirin 81 mg once daily. He cut down in his caffeine intake significantly. His symptoms of fatigue and having no energy could represent caffeine withdrawal. If he develops recurrent atrial fibrillation, management options at that time would include an antiarrhythmic medication or catheter ablation. Continue treatment with Toprol for now.

## 2012-02-02 NOTE — Assessment & Plan Note (Signed)
He had a negative treadmill nuclear stress test with excellent exercise capacity. He was able to exercise for 11 minutes without any reported symptoms.

## 2012-02-02 NOTE — Progress Notes (Signed)
HPI  Jake Liu is a pleasant 51 year old male who is here today for a followup visit. He was seen recently for atrial fibrillation. He presented early June with palpitations associated with blurred vision and dizziness. He drove himself to [facility] where he was found to be in atrial fibrillation with rapid ventricular response. His heart rate 180 beats per minute. He had no previous arrhythmia prior to that. He had an echocardiogram done and was told that his ejection fraction was 45%. His ventricular rate was difficult to control and ultimately he underwent TEE guided cardioversion successfully. He was placed on Xarelto for anticoagulation. It was felt that his atrial fibrillation was possibly triggered by excessive caffeine and alcohol intake.  Since his hospitalization, he was asked to cut down on both. He is currently drinking 2 cups of coffee a day. He has not had any recurrent arrhythmia.  During his last visit, he complained of feeling tired and having no energy. He had increased dyspnea without chest pain. Some of his symptoms were felt to be due to caffeine withdrawal. He was scheduled for a stress test. He reports no palpitations or recurrent arrhythmia.  Allergies  Allergen Reactions  . Bactrim (Sulfamethoxazole-Tmp Ds)     Sulfa/Rash  . Lisinopril     cough     Current Outpatient Prescriptions on File Prior to Visit  Medication Sig Dispense Refill  . metoprolol succinate (TOPROL-XL) 25 MG 24 hr tablet Take 50 mg by mouth daily.          Past Medical History  Diagnosis Date  . Sciatica   . Atrial fibrillation   . Cardiomyopathy     EF: 45%     Past Surgical History  Procedure Date  . Knee surgery      Family History  Problem Relation Age of Onset  . Heart disease Mother 59  . Asthma Father      History   Social History  . Marital Status: Single    Spouse Name: N/A    Number of Children: N/A  . Years of Education: N/A    Occupational History  . Not on file.   Social History Main Topics  . Smoking status: Never Smoker   . Smokeless tobacco: Never Used  . Alcohol Use: 3.5 oz/week    7 drink(s) per week     drinks at night.  Drinks 10 cups coffee daily  . Drug Use: No  . Sexually Active: Not on file   Other Topics Concern  . Not on file   Social History Narrative  . No narrative on file     PHYSICAL EXAM   BP 130/74  Pulse 62  Ht 6' (1.829 m)  Wt 204 lb 12 oz (92.874 kg)  BMI 27.77 kg/m2 Constitutional: He is oriented to person, place, and time. He appears well-developed and well-nourished. No distress.  HENT: No nasal discharge.  Head: Normocephalic and atraumatic.  Eyes: Pupils are equal and round. Right eye exhibits no discharge. Left eye exhibits no discharge.  Neck: Normal range of motion. Neck supple. No JVD present. No thyromegaly present.  Cardiovascular: Normal rate, regular rhythm, normal heart sounds and. Exam reveals no gallop and no friction rub. No murmur heard.  Pulmonary/Chest: Effort normal and breath sounds normal. No stridor. No respiratory distress. He has no wheezes. He has no rales. He exhibits no tenderness.  Abdominal: Soft. Bowel sounds are normal. He exhibits no distension. There is no tenderness. There is  no rebound and no guarding.  Musculoskeletal: Normal range of motion. He exhibits no edema and no tenderness.  Neurological: He is alert and oriented to person, place, and time. Coordination normal.  Skin: Skin is warm and dry. No rash noted. He is not diaphoretic. No erythema. No pallor.  Psychiatric: He has a normal mood and affect. His behavior is normal. Judgment and thought content normal.       ASSESSMENT AND PLAN

## 2012-02-02 NOTE — Assessment & Plan Note (Addendum)
He had a nuclear stress test done showed normal LV systolic function with an ejection fraction of 59%. Given the normalization of his ejection fraction, I will stop losartan. Would continue to monitor his blood pressure off losartan. The patient does not have previous history of hypertension. He was started on losartan likely due to cardiomyopathy.

## 2012-02-02 NOTE — Patient Instructions (Addendum)
Stop Xarelto.  Stop Losartan.  Start Aspirin 81 mg once daily.  Continue Toprol.  Follow up in 3 months.

## 2012-03-16 ENCOUNTER — Ambulatory Visit (INDEPENDENT_AMBULATORY_CARE_PROVIDER_SITE_OTHER): Payer: PRIVATE HEALTH INSURANCE | Admitting: Internal Medicine

## 2012-03-16 ENCOUNTER — Encounter: Payer: Self-pay | Admitting: Internal Medicine

## 2012-03-16 ENCOUNTER — Ambulatory Visit: Payer: PRIVATE HEALTH INSURANCE | Admitting: Internal Medicine

## 2012-03-16 VITALS — BP 140/90 | HR 72 | Temp 98.3°F | Wt 211.0 lb

## 2012-03-16 DIAGNOSIS — I4891 Unspecified atrial fibrillation: Secondary | ICD-10-CM

## 2012-03-16 DIAGNOSIS — J40 Bronchitis, not specified as acute or chronic: Secondary | ICD-10-CM | POA: Insufficient documentation

## 2012-03-16 DIAGNOSIS — I519 Heart disease, unspecified: Secondary | ICD-10-CM

## 2012-03-16 MED ORDER — LEVOFLOXACIN 750 MG PO TABS: 750.0000 mg | ORAL_TABLET | Freq: Every day | ORAL | Status: AC

## 2012-03-16 NOTE — Assessment & Plan Note (Signed)
Having some episodes of tachycardia. Exam normal today with regular HR. Discussed potentially getting Holter monitor for evaluation. Pt would like to hold off for now. He will call if symptoms increasing in frequency. He will continue metoprolol. He has cardiology follow up in October. Encouraged him to limit dose of caffeine and alcohol.

## 2012-03-16 NOTE — Progress Notes (Signed)
  Subjective:    Patient ID: Jake Liu, male    DOB: [birth year], 51 y.o.   MRN: [id number]  HPI 51YO male with h/o atrial fibrillation s/p cardioversion presents for acute visit c/o 2 weeks of cough productive of purulent sputum, nasal congestion, sinus pressure. Denies chest pain, shortness of breath. Has not been taking meds for this. Daughter sick with similar symptoms. Pt also notes recent episodes of palpitations with rapid HR, particularly at night. Has been taking metoprolol, although forgot med today. Notes increased intake of caffeine and typically drinks 3 glasses of wine per night. Denies any chest pain, diaphoresis, nausea, shortness of breath. Was taken off other BP meds and anticoagulation by cardiologist at [facility].  Outpatient Encounter Prescriptions as of 03/16/2012  Medication Sig Dispense Refill  . metoprolol succinate (TOPROL-XL) 25 MG 24 hr tablet Take 50 mg by mouth daily.       Marland Kitchen levofloxacin (LEVAQUIN) 750 MG tablet Take 1 tablet (750 mg total) by mouth daily.  7 tablet  0   BP 140/90  Pulse 72  Temp 98.3 F (36.8 C) (Oral)  Wt 211 lb (95.709 kg)  SpO2 98%  Review of Systems  Constitutional: Positive for fatigue. Negative for fever, chills, activity change, appetite change and unexpected weight change.  HENT: Positive for congestion and sinus pressure. Negative for ear pain, sore throat, neck pain, neck stiffness and voice change.   Eyes: Negative for visual disturbance.  Respiratory: Positive for cough. Negative for shortness of breath.   Cardiovascular: Positive for palpitations. Negative for chest pain and leg swelling.  Gastrointestinal: Negative for abdominal pain and abdominal distention.  Genitourinary: Negative for dysuria, urgency and difficulty urinating.  Musculoskeletal: Negative for arthralgias and gait problem.  Skin: Negative for color change and rash.  Hematological: Negative for adenopathy.  Psychiatric/Behavioral: Negative for disturbed wake/sleep  cycle and dysphoric mood. The patient is not nervous/anxious.        Objective:   Physical Exam  Constitutional: He is oriented to person, place, and time. He appears well-developed and well-nourished. No distress.  HENT:  Head: Normocephalic and atraumatic.  Right Ear: External ear normal.  Left Ear: External ear normal.  Nose: Nose normal.  Mouth/Throat: Oropharynx is clear and moist. No oropharyngeal exudate.  Eyes: Conjunctivae and EOM are normal. Pupils are equal, round, and reactive to light. Right eye exhibits no discharge. Left eye exhibits no discharge. No scleral icterus.  Neck: Normal range of motion. Neck supple. No tracheal deviation present. No thyromegaly present.  Cardiovascular: Normal rate, regular rhythm and normal heart sounds.  Exam reveals no gallop and no friction rub.   No murmur heard. Pulmonary/Chest: Effort normal. No accessory muscle usage. Not tachypneic. No respiratory distress. He has no decreased breath sounds. He has no wheezes. He has rhonchi in the left lower field. He has no rales. He exhibits no tenderness.  Musculoskeletal: Normal range of motion. He exhibits no edema.  Lymphadenopathy:    He has no cervical adenopathy.  Neurological: He is alert and oriented to person, place, and time. No cranial nerve deficit. Coordination normal.  Skin: Skin is warm and dry. No rash noted. He is not diaphoretic. No erythema. No pallor.  Psychiatric: He has a normal mood and affect. His behavior is normal. Judgment and thought content normal.          Assessment & Plan:

## 2012-03-16 NOTE — Assessment & Plan Note (Signed)
Symptoms and exam consistent with early bronchitis. Will treat with Levaquin. Pt has tessalon at home to use for cough if needed. Follow up prn.

## 2012-04-30 ENCOUNTER — Ambulatory Visit: Payer: PRIVATE HEALTH INSURANCE | Admitting: Cardiovascular Disease

## 2012-05-07 ENCOUNTER — Ambulatory Visit: Payer: PRIVATE HEALTH INSURANCE | Admitting: Cardiovascular Disease

## 2012-05-17 ENCOUNTER — Encounter: Payer: Self-pay | Admitting: Cardiovascular Disease

## 2012-05-17 ENCOUNTER — Ambulatory Visit (INDEPENDENT_AMBULATORY_CARE_PROVIDER_SITE_OTHER): Payer: PRIVATE HEALTH INSURANCE | Admitting: Cardiovascular Disease

## 2012-05-17 VITALS — BP 120/90 | HR 62 | Ht 72.0 in | Wt 212.8 lb

## 2012-05-17 DIAGNOSIS — I429 Cardiomyopathy, unspecified: Secondary | ICD-10-CM

## 2012-05-17 DIAGNOSIS — I519 Heart disease, unspecified: Secondary | ICD-10-CM

## 2012-05-17 DIAGNOSIS — I4891 Unspecified atrial fibrillation: Secondary | ICD-10-CM

## 2012-05-17 DIAGNOSIS — I10 Essential (primary) hypertension: Secondary | ICD-10-CM

## 2012-05-17 DIAGNOSIS — I428 Other cardiomyopathies: Secondary | ICD-10-CM

## 2012-05-17 NOTE — Assessment & Plan Note (Signed)
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 His ejection fraction normalized.

## 2012-05-17 NOTE — Progress Notes (Signed)
HPI  Jake Liu is a pleasant 51 year old male who is here today for a followup visit. He presented early June with palpitations associated with blurred vision and dizziness. He drove himself to [facility] where he was found to be in atrial fibrillation with rapid ventricular response. His heart rate 180 beats per minute. He had no previous arrhythmia prior to that. He had an echocardiogram done and was told that his ejection fraction was 45%. His ventricular rate was difficult to control and ultimately he underwent TEE guided cardioversion successfully. He was placed on Xarelto for anticoagulation. It was felt that his atrial fibrillation was possibly triggered by excessive caffeine and alcohol intake.  He cut down on both of these. After that, he complained of increased dyspnea and fatigue. He underwent evaluation with a treadmill nuclear stress test which showed no evidence of ischemia with normal ejection fraction. I took him off anticoagulation as well as losartan given his normal ejection fraction and no hypertension. He was continued on Toprol. I also asked him to take aspirin 81 mg once daily. He has not been taking that on a regular basis. He had 2 episodes recently while he was driving the car where he had blurred vision and dizziness. This was similar to his previous presentation with atrial fibrillation. This lasted for about 10 minutes and he actually had to pull over to the side of the road. He checked his pulse and did not feel that he was tachycardic. He increases caffeine intake recently to 3 cups a day and has been dining out frequently. He gained 8 pounds since last visit. He is not exercising on a regular basis.  Allergies  Allergen Reactions  . Bactrim (Sulfamethoxazole-Tmp Ds)     Sulfa/Rash  . Lisinopril     cough     No current outpatient prescriptions on file prior to visit.     Past Medical History  Diagnosis Date  . Sciatica   . Atrial  fibrillation   . Cardiomyopathy     EF: 45%     Past Surgical History  Procedure Date  . Knee surgery      Family History  Problem Relation Age of Onset  . Heart disease Mother 104  . Asthma Father      History   Social History  . Marital Status: Single    Spouse Name: N/A    Number of Children: N/A  . Years of Education: N/A   Occupational History  . Not on file.   Social History Main Topics  . Smoking status: Never Smoker   . Smokeless tobacco: Never Used  . Alcohol Use: 3.5 oz/week    7 drink(s) per week     drinks at night.  Drinks 10 cups coffee daily  . Drug Use: No  . Sexually Active: Not on file   Other Topics Concern  . Not on file   Social History Narrative  . No narrative on file     PHYSICAL EXAM   BP 120/90  Pulse 62  Ht 6' (1.829 m)  Wt 212 lb 12 oz (96.503 kg)  BMI 28.85 kg/m2 Constitutional: He is oriented to person, place, and time. He appears well-developed and well-nourished. No distress.  HENT: No nasal discharge.  Head: Normocephalic and atraumatic.  Eyes: Pupils are equal and round. Right eye exhibits no discharge. Left eye exhibits no discharge.  Neck: Normal range of motion. Neck supple. No JVD present. No thyromegaly present.  Cardiovascular:  Normal rate, regular rhythm, normal heart sounds and. Exam reveals no gallop and no friction rub. No murmur heard.  Pulmonary/Chest: Effort normal and breath sounds normal. No stridor. No respiratory distress. He has no wheezes. He has no rales. He exhibits no tenderness.  Abdominal: Soft. Bowel sounds are normal. He exhibits no distension. There is no tenderness. There is no rebound and no guarding.  Musculoskeletal: Normal range of motion. He exhibits no edema and no tenderness.  Neurological: He is alert and oriented to person, place, and time. Coordination normal.  Skin: Skin is warm and dry. No rash noted. He is not diaphoretic. No erythema. No pallor.  Psychiatric: He has a normal  mood and affect. His behavior is normal. Judgment and thought content normal.     [id number]  Rhythm  WITHIN NORMAL LIMITS  ASSESSMENT AND PLAN

## 2012-05-17 NOTE — Patient Instructions (Addendum)
Your physician has recommended that you wear an event monitor. Event monitors are medical devices that record the heart's electrical activity. Doctors most often [place] these monitors to diagnose arrhythmias. Arrhythmias are problems with the speed or rhythm of the heartbeat. The monitor is a small, portable device. You can wear one while you do your normal daily activities. This is usually used to diagnose what is causing palpitations/syncope (passing out).  Start an exercise program.   Follow up in 6 months.

## 2012-05-17 NOTE — Assessment & Plan Note (Signed)
He is currently in normal sinus rhythm. He had 2 recent episodes of nerve and dizziness similar to his previous presentation with atrial fibrillation. However, he did not feel palpitations or tachycardia during these episodes which is different from his initial presentation. Thus,  I will request a 30 day outpatient telemetry to clarify this and see if these events are related to arrhythmia or whether there is another etiology. I asked him to decrease caffeine intake 2 maximal is 2 cups a day. I also had a prolonged discussion with him about the importance of lifestyle changes including weight loss and regular exercise. I am concerned that he gained 8 pounds over the last few months.

## 2012-06-01 ENCOUNTER — Telehealth: Payer: Self-pay

## 2012-06-01 NOTE — Telephone Encounter (Signed)
Received correspondence from [employer] stating they have been unable to reach pt via phone # we provided them. Therefore they are cancelling monitor I attempted to reach pt to confirm/find out why he has not been accepting their calls/wearing monitor I was sent to pt's VM LMTCB

## 2012-06-01 NOTE — Telephone Encounter (Signed)
LMTCB

## 2012-06-07 NOTE — Telephone Encounter (Signed)
LMTCB

## 2012-06-10 ENCOUNTER — Telehealth: Payer: Self-pay | Admitting: Internal Medicine

## 2012-06-10 NOTE — Telephone Encounter (Signed)
Patient instructed by physician to go to Urgent Care.

## 2012-06-14 ENCOUNTER — Encounter: Payer: Self-pay | Admitting: Internal Medicine

## 2012-06-14 ENCOUNTER — Ambulatory Visit (INDEPENDENT_AMBULATORY_CARE_PROVIDER_SITE_OTHER): Payer: PRIVATE HEALTH INSURANCE | Admitting: Internal Medicine

## 2012-06-14 VITALS — BP 118/80 | HR 79 | Temp 97.8°F | Ht 72.0 in | Wt 212.8 lb

## 2012-06-14 DIAGNOSIS — J40 Bronchitis, not specified as acute or chronic: Secondary | ICD-10-CM

## 2012-06-14 MED ORDER — AZITHROMYCIN 250 MG PO TABS: ORAL_TABLET | ORAL | Status: DC

## 2012-06-14 NOTE — Assessment & Plan Note (Signed)
Symptoms and exam are most consistent with bronchitis. Will treat with azithromycin. If no improvement over next 72hr, would favor getting CXR. Follow up prn.

## 2012-06-14 NOTE — Progress Notes (Signed)
  Subjective:    Patient ID: Jake Liu, male    DOB: July 28, 1961, 51 y.o.   MRN: [id number]  HPI 51 year old male with history of atrial fibrillation and hypertension presents for acute visit complaining of 2 week history of sore throat, subjective fever, and cough productive of purulent sputum. Cough is present throughout the day and night. It does limit sleep on occasion. It is productive of thick sputum but no blood.He denies any shortness of breath, chest pain, wheezing. He is unable to function and exercise without difficulty. He has not taken any medication for his symptoms.   Outpatient Encounter Prescriptions as of 06/14/2012  Medication Sig Dispense Refill  . metoprolol succinate (TOPROL-XL) 50 MG 24 hr tablet Take 50 mg by mouth daily. Take with or immediately following a meal.       BP 118/80  Pulse 79  Temp 97.8 F (36.6 C) (Oral)  Ht 6' (1.829 m)  Wt 212 lb 12 oz (96.503 kg)  BMI 28.85 kg/m2  SpO2 95%  Review of Systems  Constitutional: Positive for fever. Negative for chills, activity change and fatigue.  HENT: Positive for congestion, sore throat and postnasal drip. Negative for hearing loss, ear pain, nosebleeds, rhinorrhea, sneezing, trouble swallowing, neck pain, neck stiffness, voice change, sinus pressure, tinnitus and ear discharge.   Eyes: Negative for discharge, redness, itching and visual disturbance.  Respiratory: Positive for cough. Negative for chest tightness, shortness of breath, wheezing and stridor.   Cardiovascular: Negative for chest pain and leg swelling.  Musculoskeletal: Negative for myalgias and arthralgias.  Skin: Negative for color change and rash.  Neurological: Negative for dizziness, facial asymmetry and headaches.  Psychiatric/Behavioral: Negative for sleep disturbance.       Objective:   Physical Exam  Constitutional: He is oriented to person, place, and time. He appears well-developed and well-nourished. No distress.  HENT:  Head:  Normocephalic and atraumatic.  Right Ear: External ear normal.  Left Ear: External ear normal.  Nose: Nose normal.  Mouth/Throat: Posterior oropharyngeal erythema present. No oropharyngeal exudate.  Eyes: Conjunctivae normal and EOM are normal. Pupils are equal, round, and reactive to light. Right eye exhibits no discharge. Left eye exhibits no discharge. No scleral icterus.  Neck: Normal range of motion. Neck supple. No tracheal deviation present. No thyromegaly present.  Cardiovascular: Normal rate, regular rhythm and normal heart sounds.  Exam reveals no gallop and no friction rub.   No murmur heard. Pulmonary/Chest: Effort normal. No accessory muscle usage. Not tachypneic. No respiratory distress. He has no decreased breath sounds. He has no wheezes. He has rhonchi (rare scattered). He has no rales. He exhibits no tenderness.  Musculoskeletal: Normal range of motion. He exhibits no edema.  Lymphadenopathy:    He has no cervical adenopathy.  Neurological: He is alert and oriented to person, place, and time. No cranial nerve deficit. Coordination normal.  Skin: Skin is warm and dry. No rash noted. He is not diaphoretic. No erythema. No pallor.  Psychiatric: He has a normal mood and affect. His behavior is normal. Judgment and thought content normal.          Assessment & Plan:

## 2012-07-30 ENCOUNTER — Telehealth: Payer: Self-pay | Admitting: Internal Medicine

## 2012-07-30 NOTE — Telephone Encounter (Signed)
Opened in error

## 2012-09-05 ENCOUNTER — Emergency Department: Payer: Self-pay | Admitting: Emergency Medicine

## 2012-09-05 IMAGING — CR DG HIP COMPLETE 2+V*L*
1 series · 2 of 2 positions shown · non-contrast
Comparison: none

REASON FOR EXAM: trauma pain
COMMENTS:   May transport without cardiac monitor

PROCEDURE:     DXR - DXR HIP LEFT COMPLETE  - [DATE]  [DATE]
RESULT:     Comparison: None.

[Series 1: ap · 0.17mm/px · 2 of 2 slices shown]
[im 1/2]
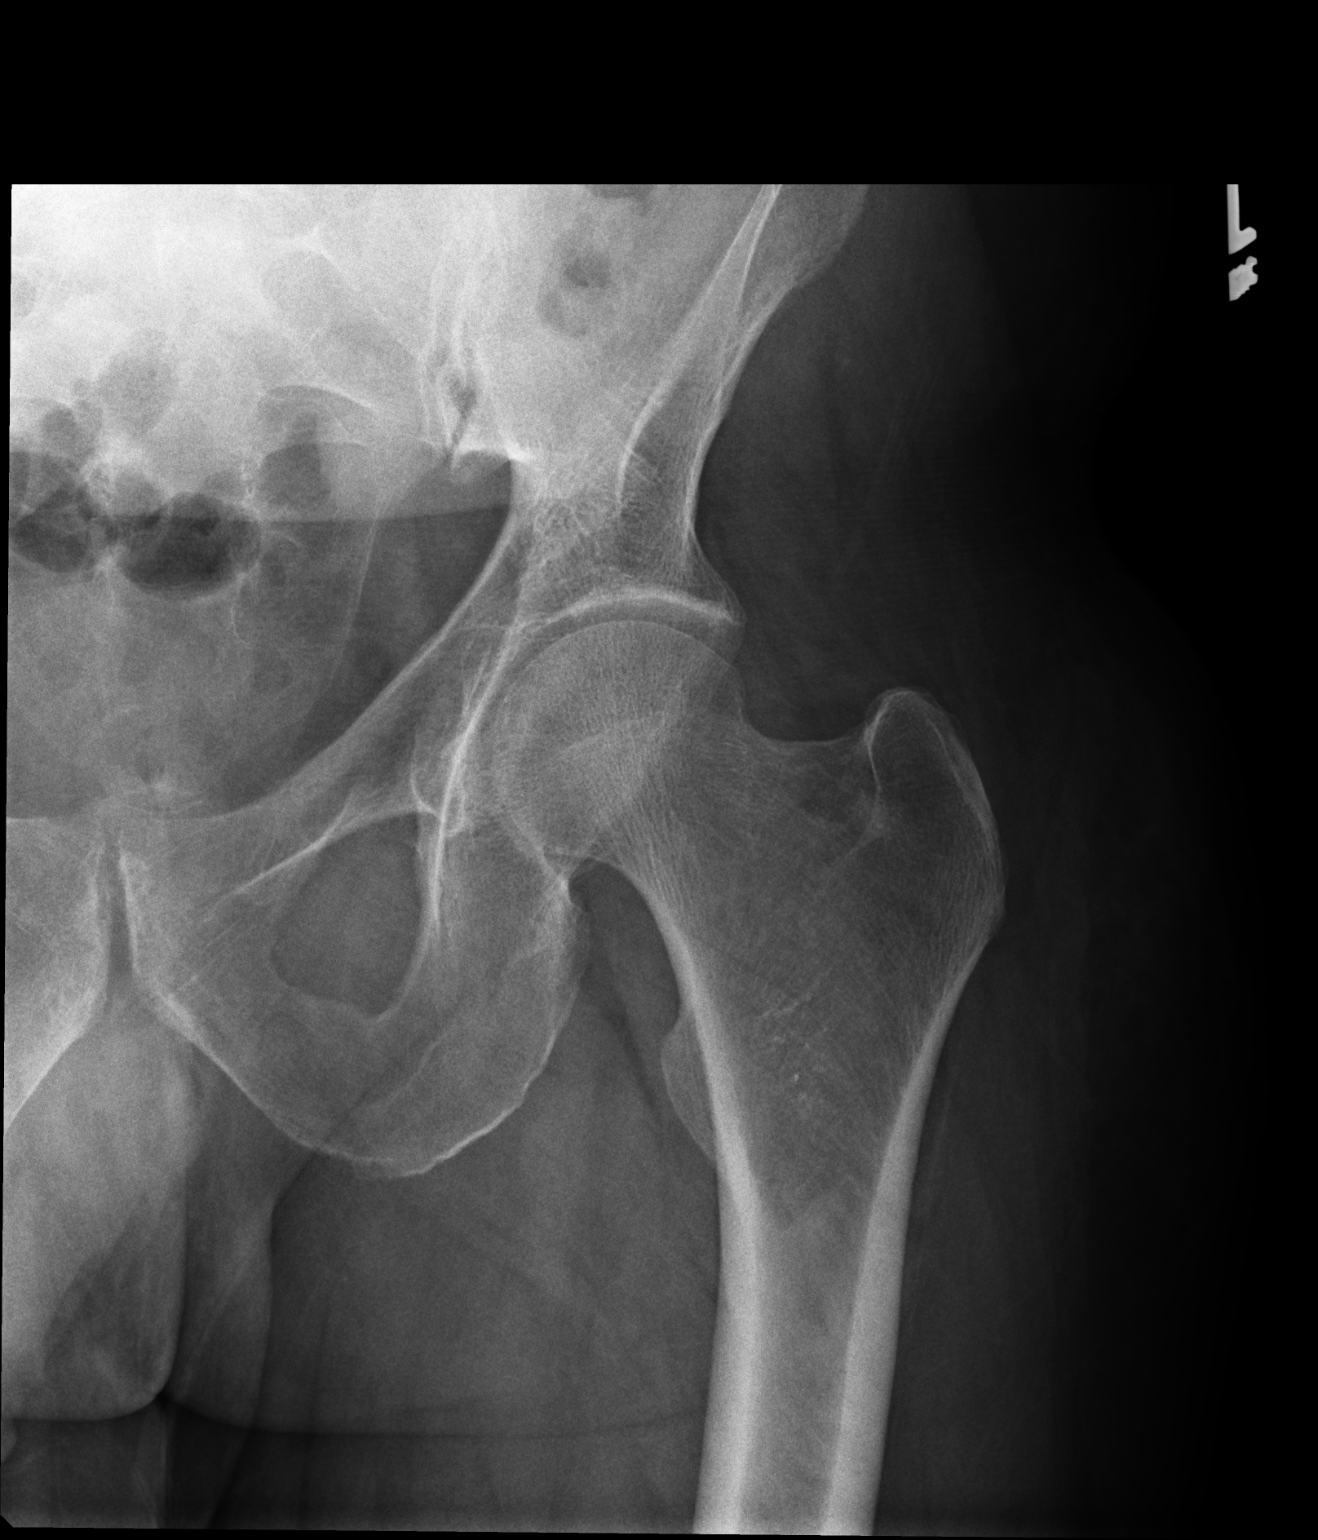
[im 2/2]
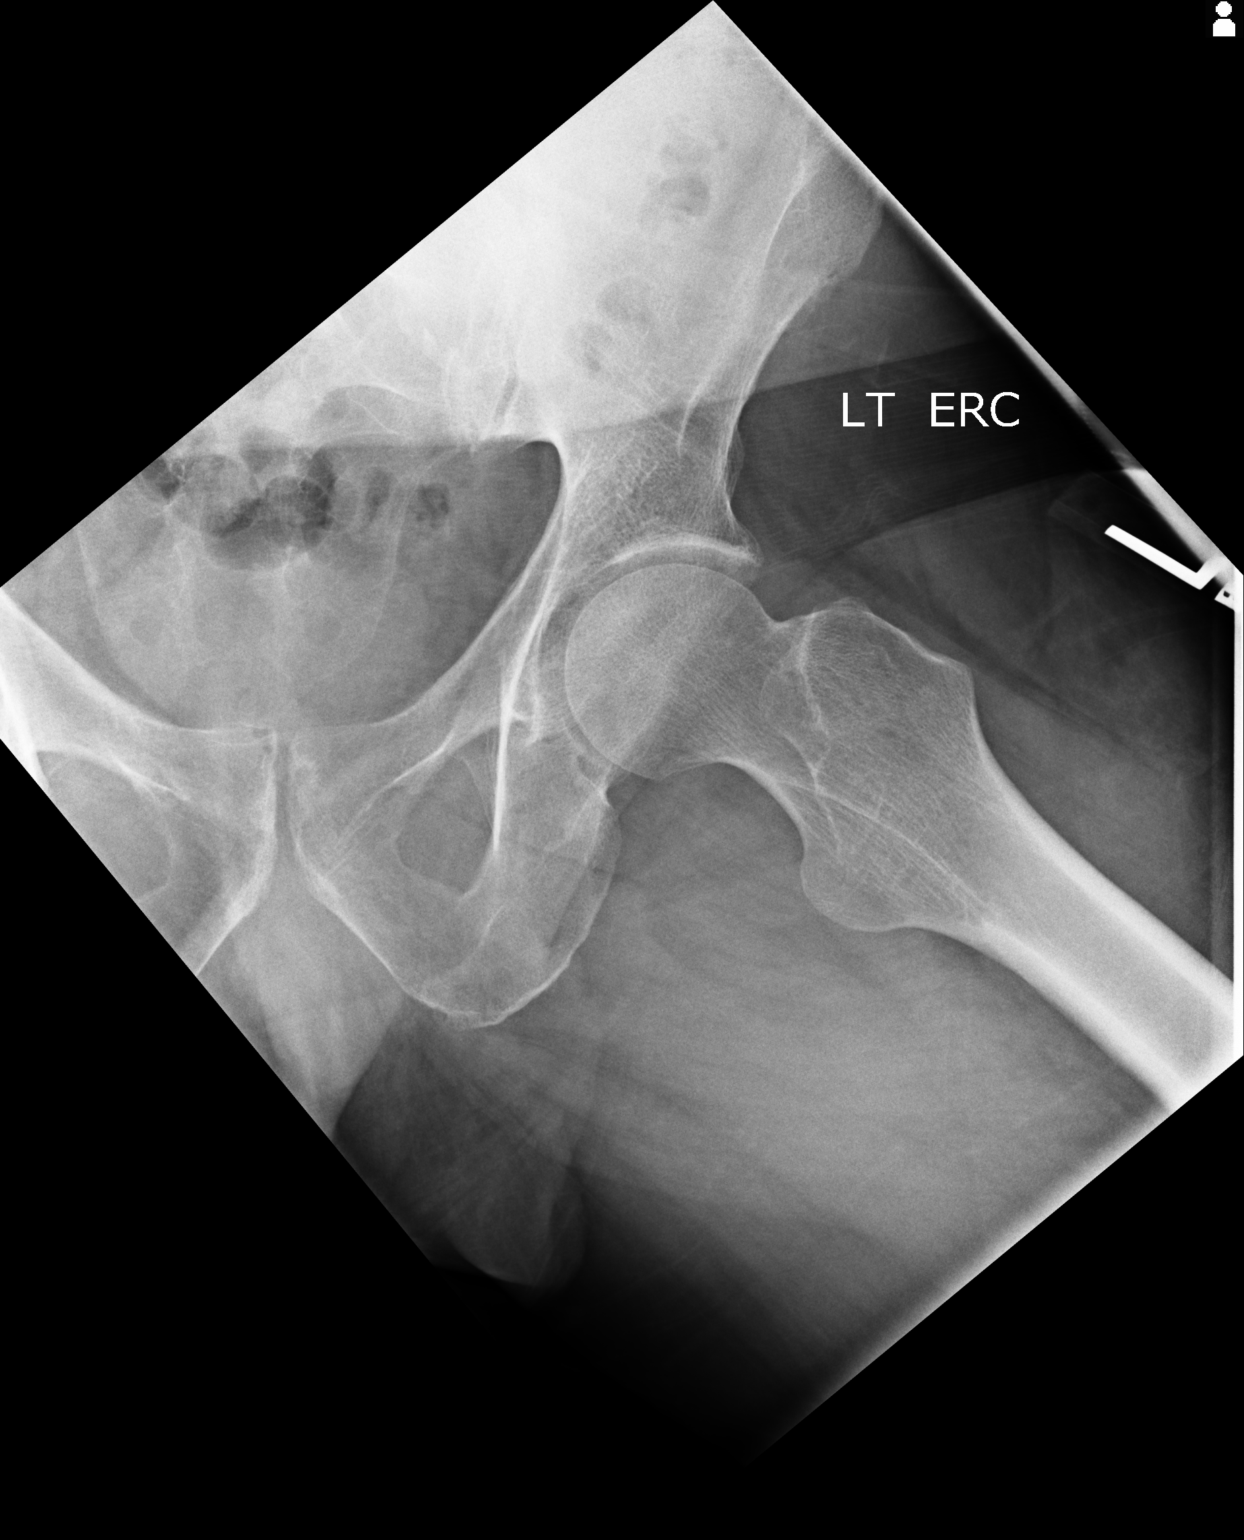

[2 of 2 positions shown; findings below may reference images not displayed]

FINDINGS: No acute fracture. Hip joint space is maintained.
IMPRESSION: No fracture.

[REDACTED]

## 2012-09-05 IMAGING — CR PELVIS - 1-2 VIEW
1 series · 1 of 1 positions shown · non-contrast
Comparison: none

REASON FOR EXAM: pain left side s/p fall
COMMENTS:

PROCEDURE:     DXR - DXR PELVIS AP ONLY  - [DATE] [DATE]
RESULT:     Comparison: None.

[t pelvis ap]
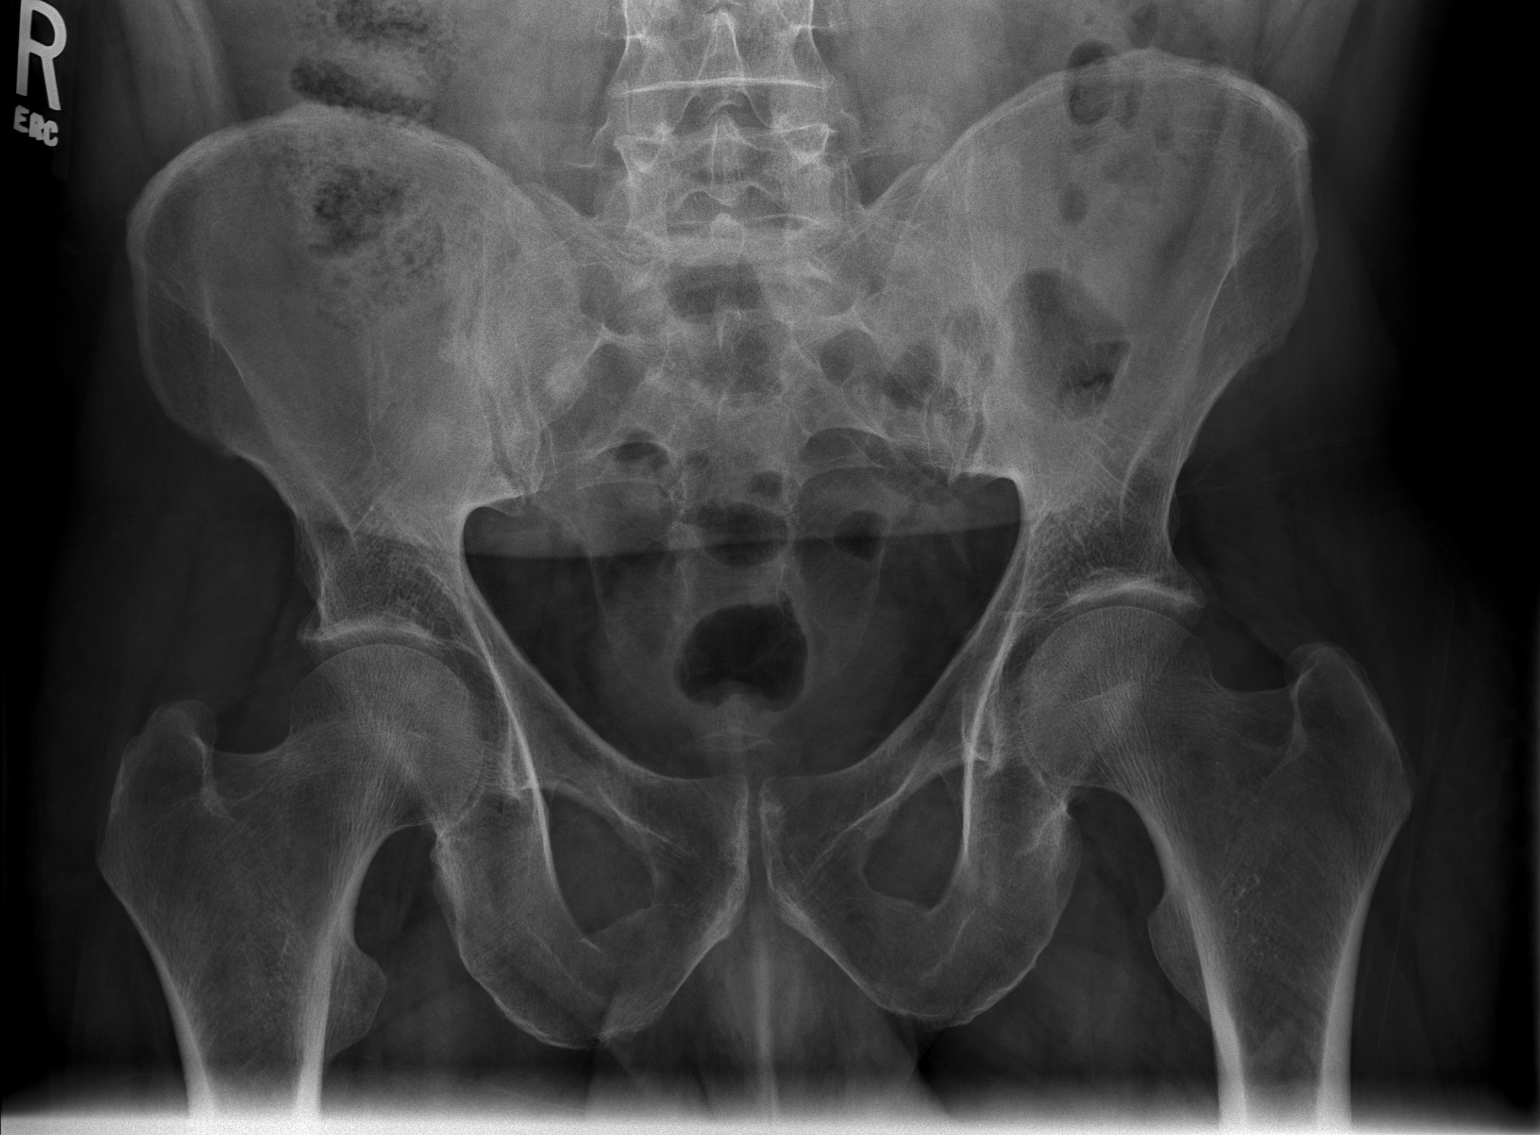

[1 of 1 positions shown; findings below may reference images not displayed]

FINDINGS: No acute fracture seen. Small round density overlying the right sacrum may
represent a bone island or overlying ingested material.
IMPRESSION: No acute fracture.

[REDACTED]

## 2012-09-05 IMAGING — CR DG FEMUR 2V*L*
1 series · 4 of 4 positions shown · non-contrast
Comparison: none

REASON FOR EXAM: pain proximally s/p fall
COMMENTS:

PROCEDURE:     DXR - DXR FEMUR LEFT  - [DATE] [DATE]
RESULT:     Comparison: None.

[Series 1: t femur proximal ap left · 0.14mm/px · 4 of 4 slices shown]
[im 1/4]
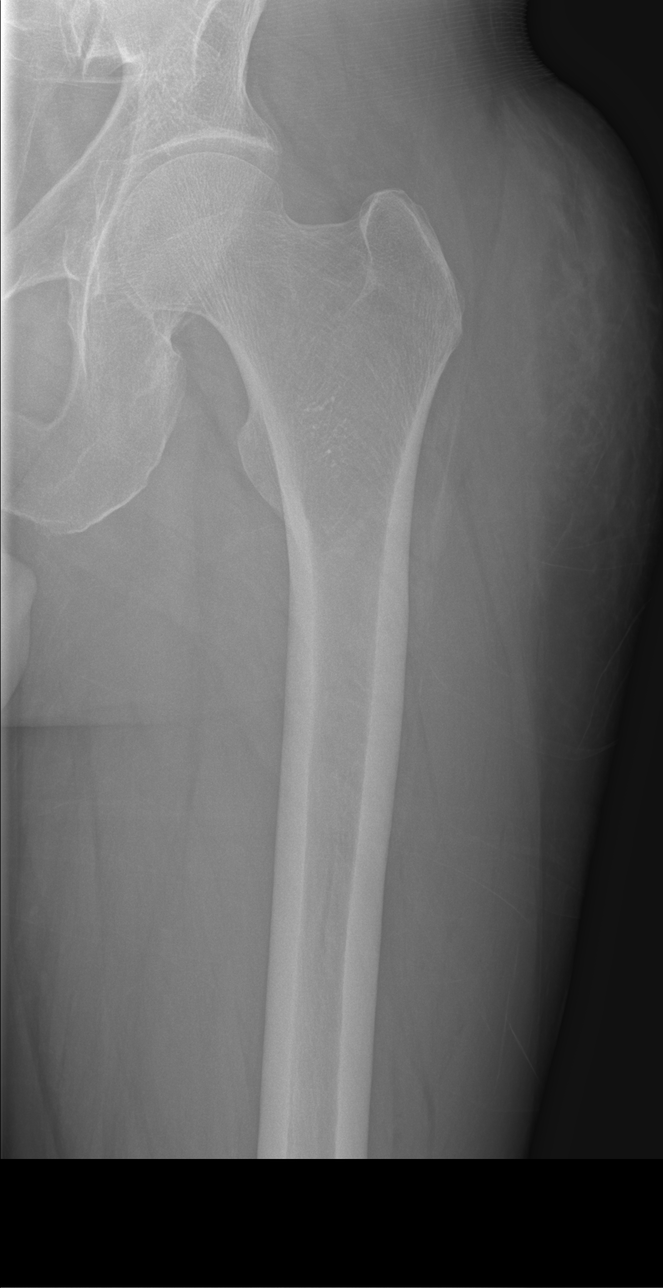
[im 2/4]
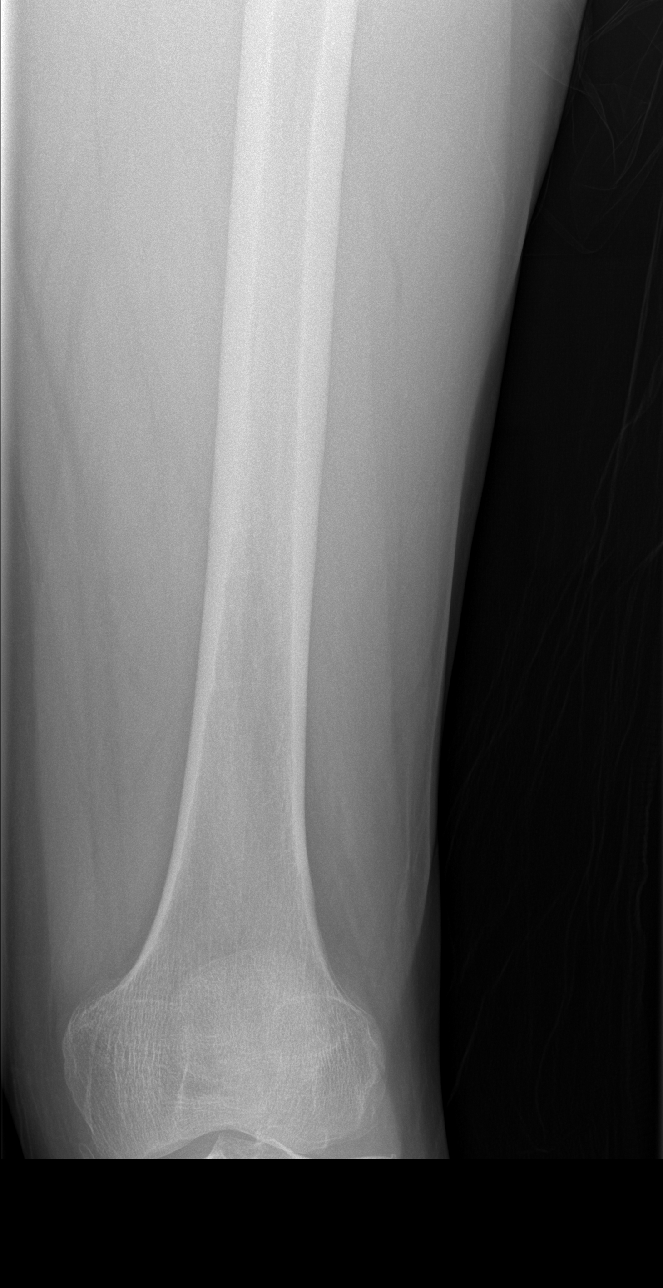
[im 3/4]
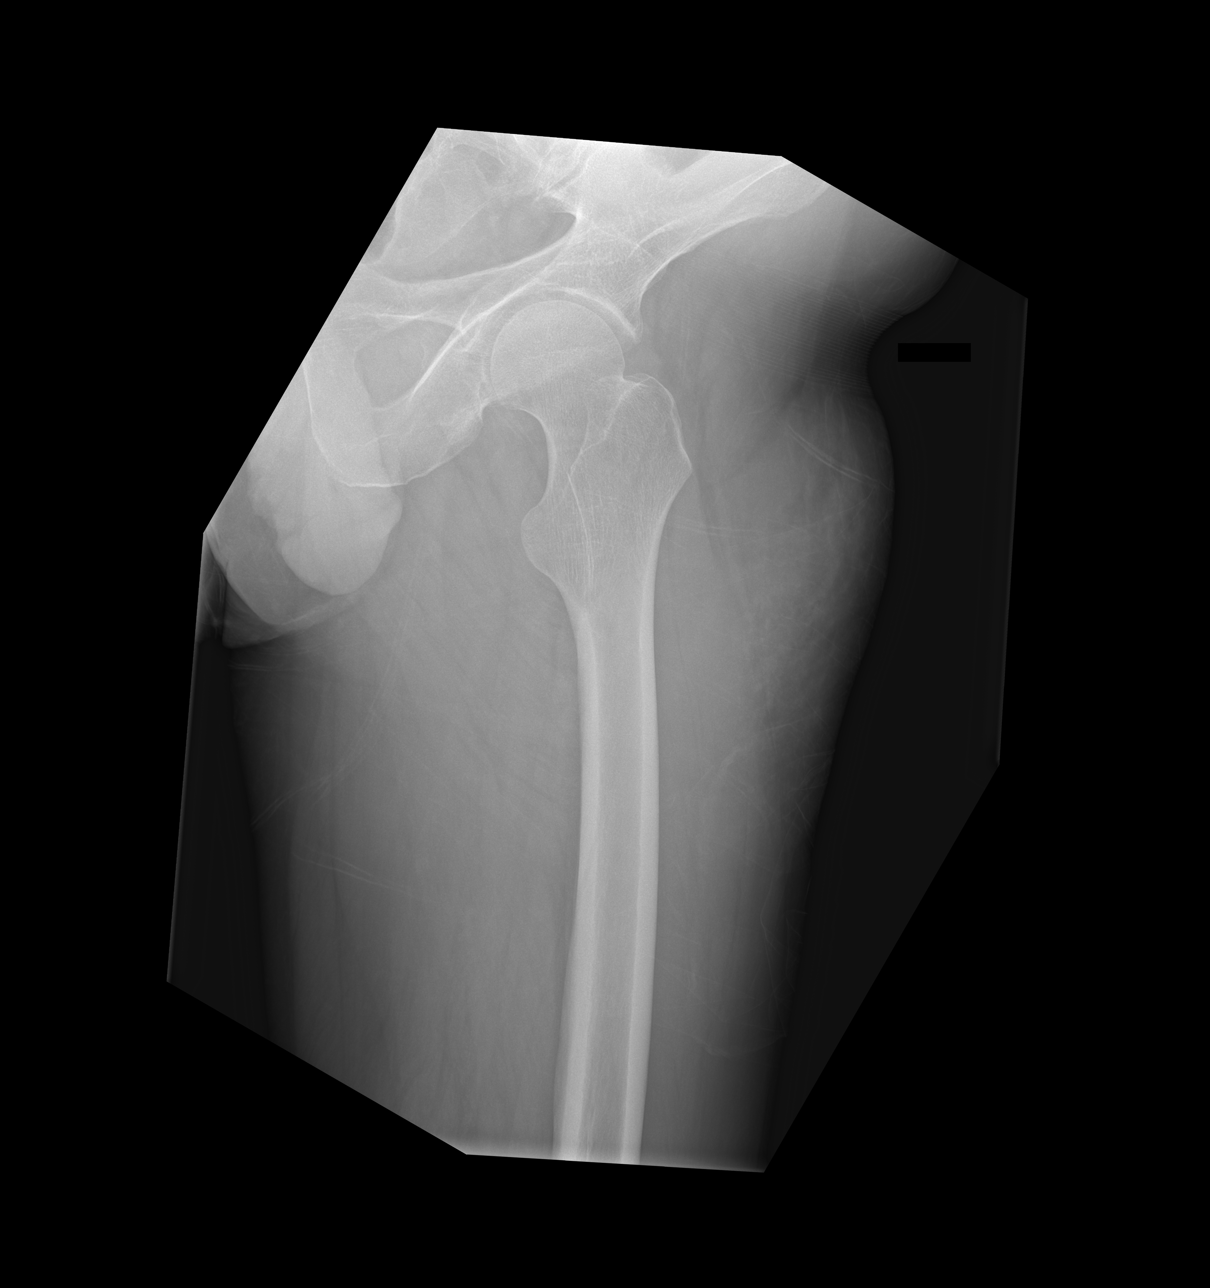
[im 4/4]
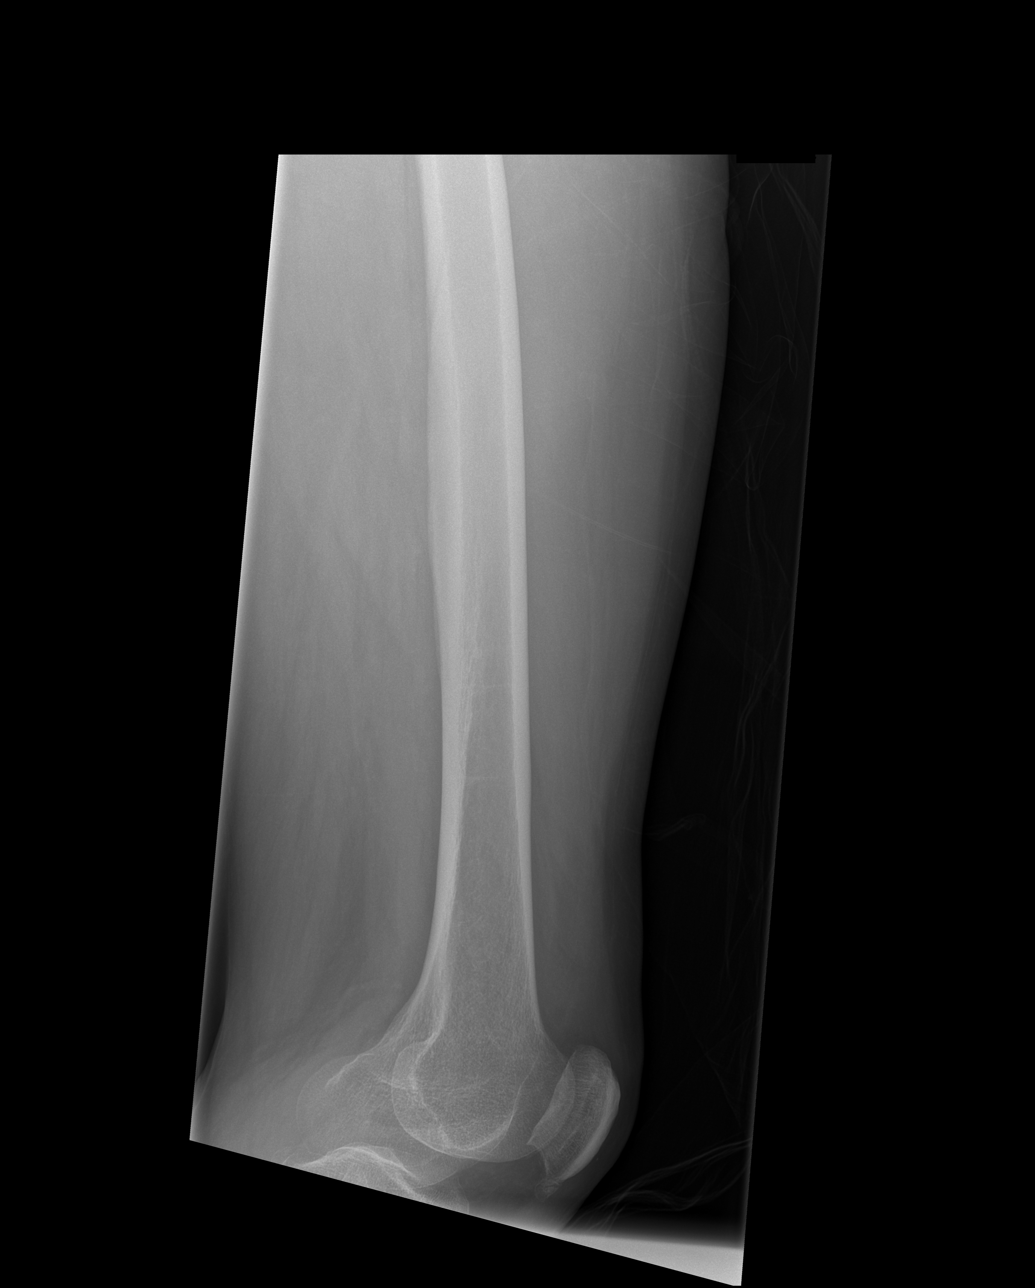

[4 of 4 positions shown; findings below may reference images not displayed]

FINDINGS: No acute fracture. There is soft tissue swelling lateral to the greater
trochanter, which is nonspecific.
IMPRESSION: Please see above.

[REDACTED]

## 2012-09-05 IMAGING — CT CT OF THE LEFT HIP WITHOUT CONTRAST
1 series · 16 of 32 positions shown, 20 images · non-contrast
Comparison: none

REASON FOR EXAM: pain s/p skateboarding fall, eval to mid femur shaft
COMMENTS:

PROCEDURE:     CT  - CT HIP LEFT WITHOUT CONTRAST  - [DATE] [DATE]
RESULT:      History: Pain.
Comparison Study: Prior left hip series [DATE].

[Series 2: hip 3.0 b70s · axial · 0.45mm/px · z∈[-236,-53]mm · 16 of 69 slices shown, 20 images]
[im 5/69  soft-tissue]
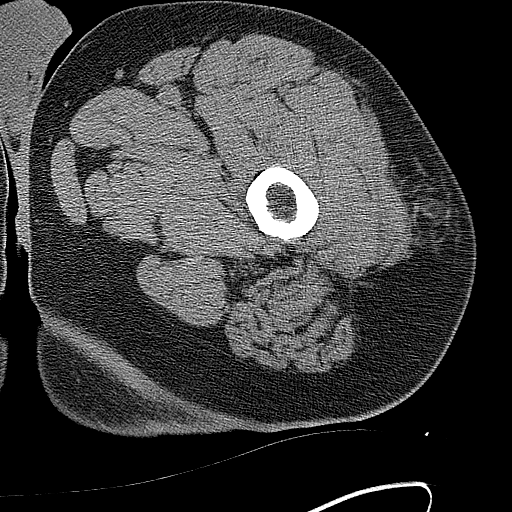
[im 5/69  bone]
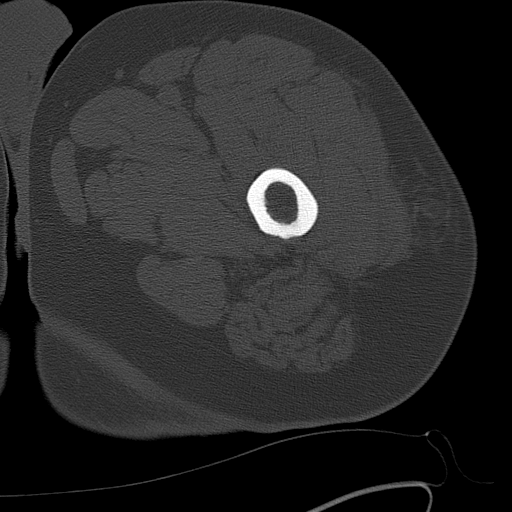
[im 9/69  soft-tissue]
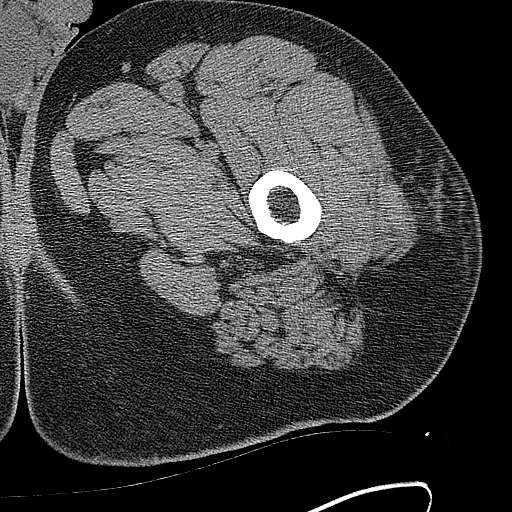
[im 14/69  soft-tissue]
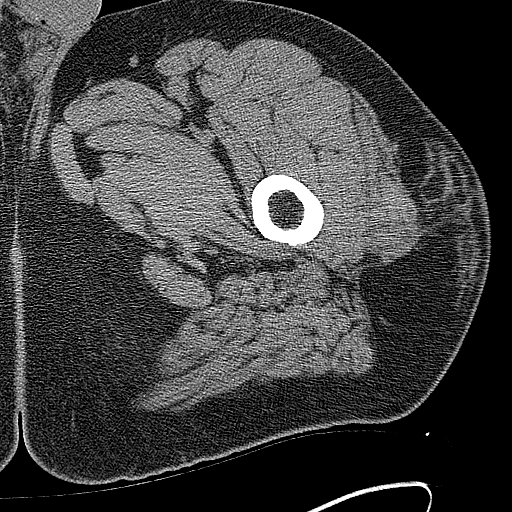
[im 18/69  soft-tissue]
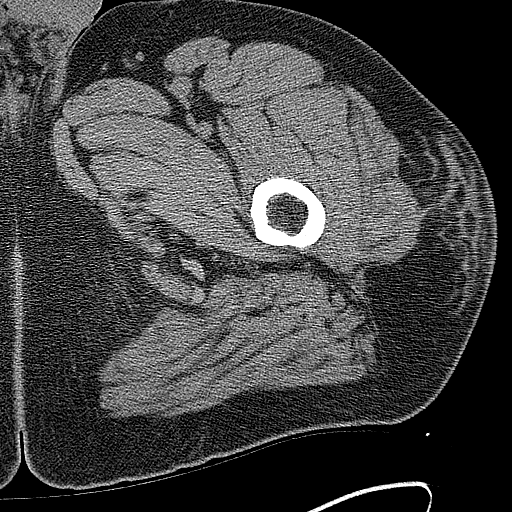
[im 22/69  soft-tissue]
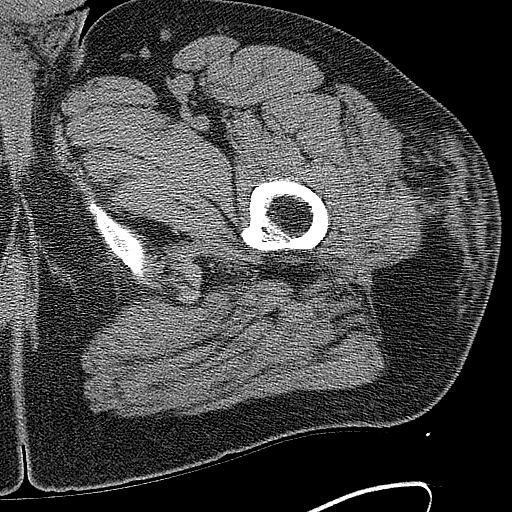
[im 27/69  soft-tissue]
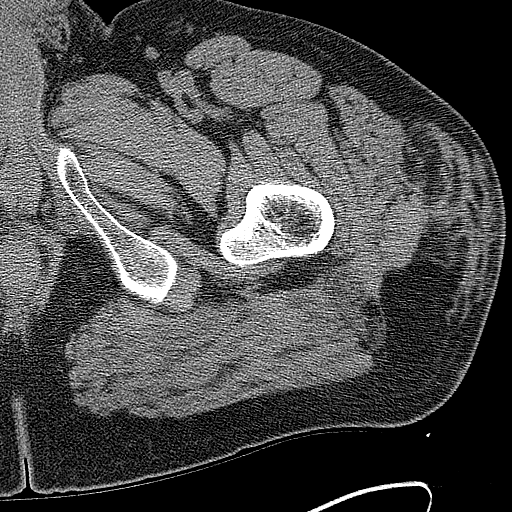
[im 31/69  soft-tissue]
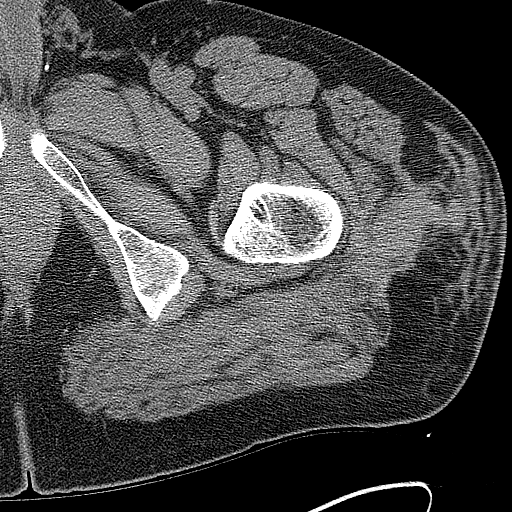
[im 38/69  soft-tissue]
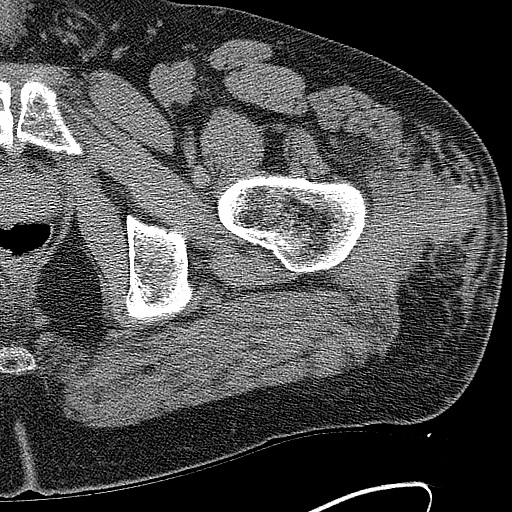
[im 42/69  soft-tissue]
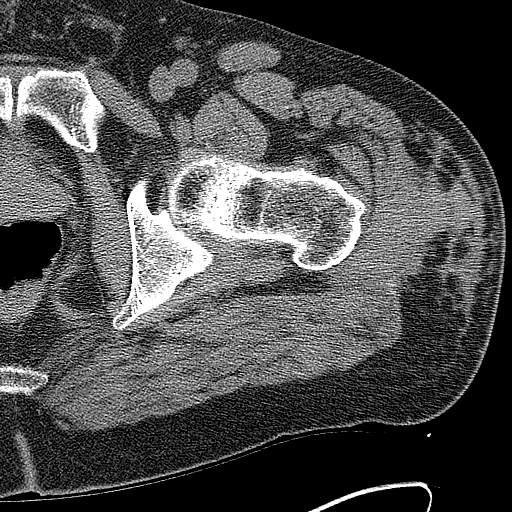
[im 42/69  bone]
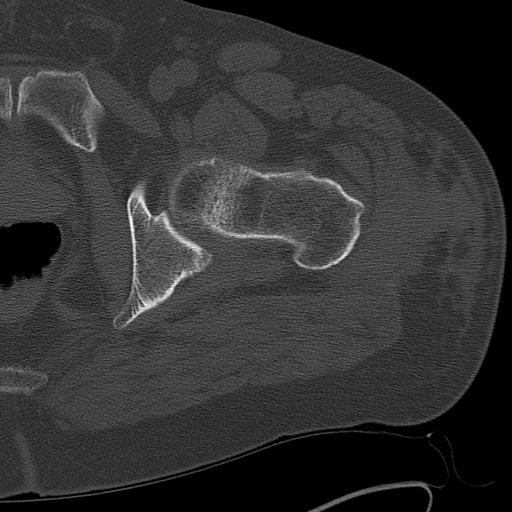
[im 47/69  soft-tissue]
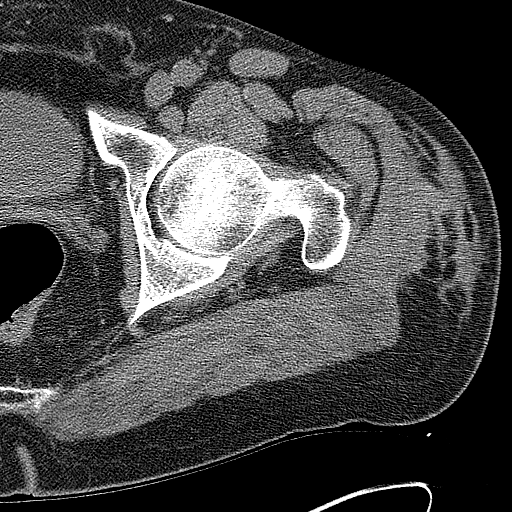
[im 51/69  soft-tissue]
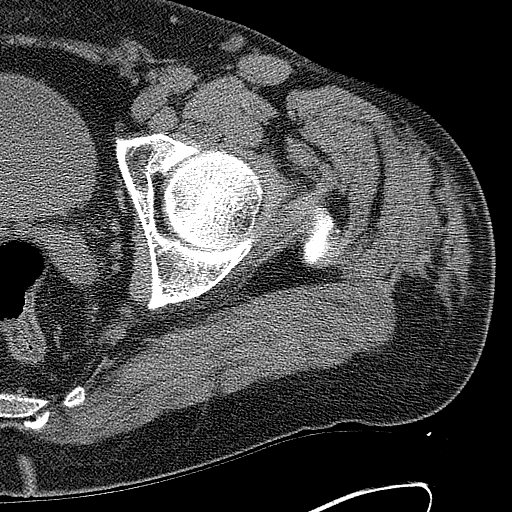
[im 55/69  soft-tissue]
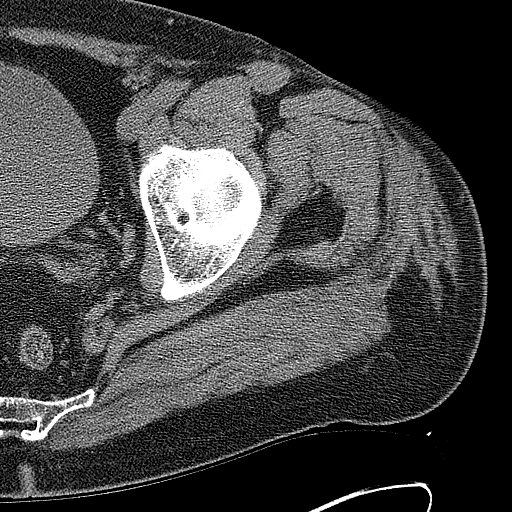
[im 60/69  soft-tissue]
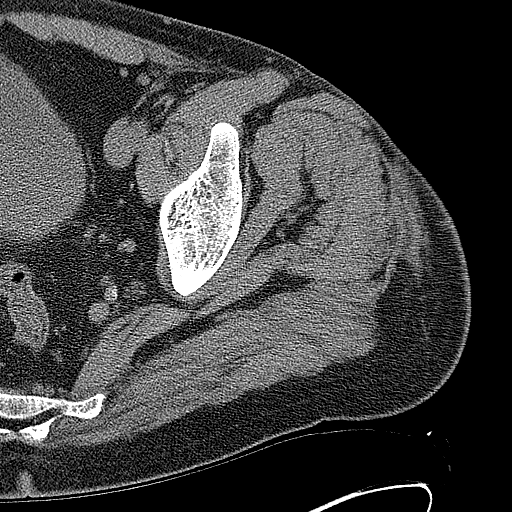
[im 60/69  lung]
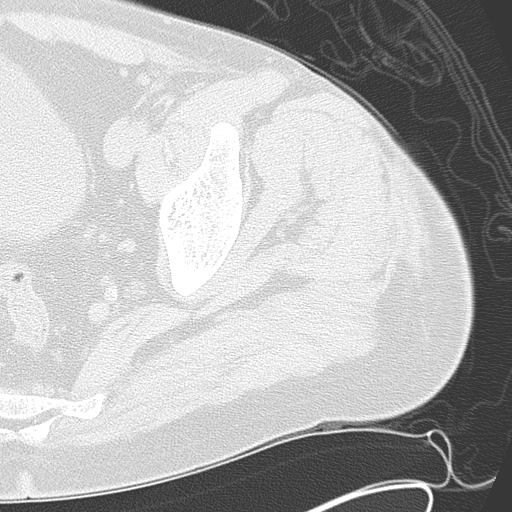
[im 62/69  lung]
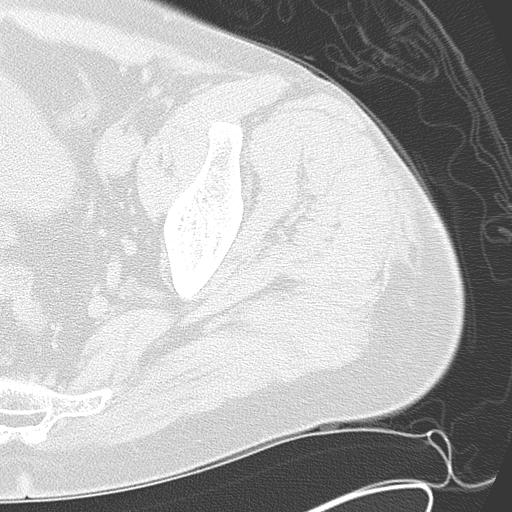
[im 64/69  soft-tissue]
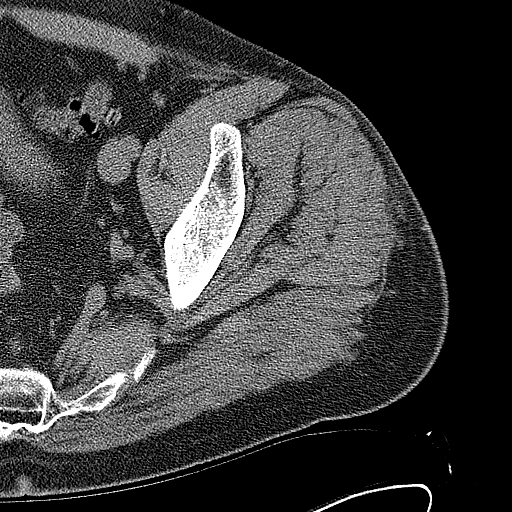
[im 64/69  lung]
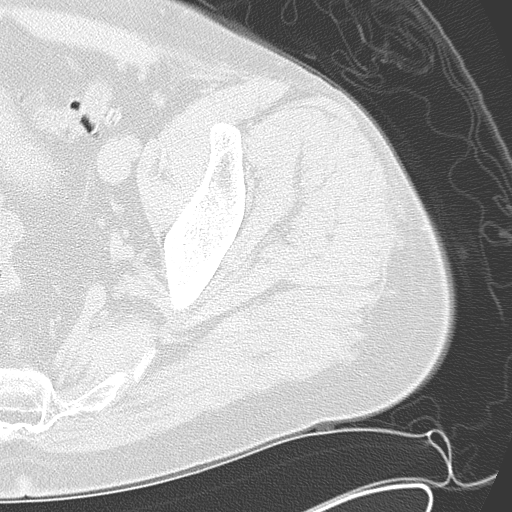
[im 66/69  lung]
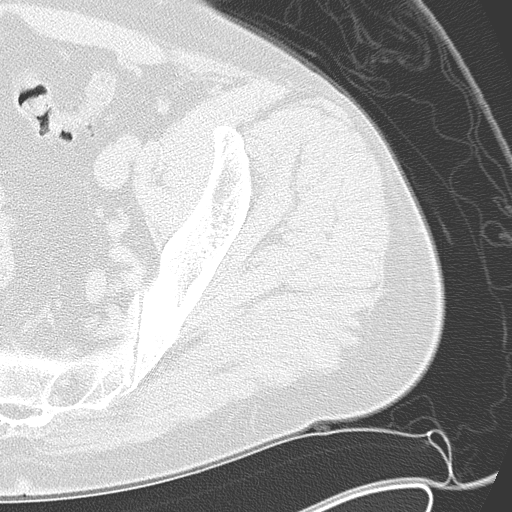

[16 of 32 positions shown; findings below may reference images not displayed]

FINDING: Standard nonenhanced exam obtained. No evidence of fracture or
dislocation.
IMPRESSION: No acute abnormality.

Addendum: Soft tissue swelling and possible hematoma noted over the lateral
left hip subcutaneous soft tissues.

## 2012-09-23 ENCOUNTER — Ambulatory Visit (INDEPENDENT_AMBULATORY_CARE_PROVIDER_SITE_OTHER): Payer: PRIVATE HEALTH INSURANCE | Admitting: Internal Medicine

## 2012-09-23 ENCOUNTER — Encounter: Payer: Self-pay | Admitting: Internal Medicine

## 2012-09-23 VITALS — BP 140/88 | HR 64 | Temp 97.6°F | Wt 214.0 lb

## 2012-09-23 DIAGNOSIS — L309 Dermatitis, unspecified: Secondary | ICD-10-CM | POA: Insufficient documentation

## 2012-09-23 DIAGNOSIS — I998 Other disorder of circulatory system: Secondary | ICD-10-CM

## 2012-09-23 DIAGNOSIS — R58 Hemorrhage, not elsewhere classified: Secondary | ICD-10-CM | POA: Insufficient documentation

## 2012-09-23 DIAGNOSIS — M25552 Pain in left hip: Secondary | ICD-10-CM | POA: Insufficient documentation

## 2012-09-23 DIAGNOSIS — L259 Unspecified contact dermatitis, unspecified cause: Secondary | ICD-10-CM

## 2012-09-23 DIAGNOSIS — M25559 Pain in unspecified hip: Secondary | ICD-10-CM

## 2012-09-23 LAB — CBC WITH DIFFERENTIAL/PLATELET
Basophils Absolute: 0.1 10*3/uL (ref 0.0–0.1)
Eosinophils Absolute: 0.4 10*3/uL (ref 0.0–0.7)
Lymphocytes Relative: 28.1 % (ref 12.0–46.0)
MCHC: 33.9 g/dL (ref 30.0–36.0)
Monocytes Absolute: 0.7 10*3/uL (ref 0.1–1.0)
Neutrophils Relative %: 57.1 % (ref 43.0–77.0)
Platelets: 269 10*3/uL (ref 150.0–400.0)
RBC: 4.77 Mil/uL (ref 4.22–5.81)
RDW: 13.4 % (ref 11.5–14.6)

## 2012-09-23 LAB — COMPREHENSIVE METABOLIC PANEL
ALT: 64 U/L — ABNORMAL HIGH (ref 0–53)
AST: 39 U/L — ABNORMAL HIGH (ref 0–37)
Albumin: 4.1 g/dL (ref 3.5–5.2)
Alkaline Phosphatase: 62 U/L (ref 39–117)
Calcium: 9.2 mg/dL (ref 8.4–10.5)
Chloride: 106 mEq/L (ref 96–112)
Potassium: 4.3 mEq/L (ref 3.5–5.1)

## 2012-09-23 MED ORDER — MELOXICAM 15 MG PO TABS: 15.0000 mg | ORAL_TABLET | Freq: Every day | ORAL | Status: DC

## 2012-09-23 MED ORDER — TRIAMCINOLONE ACETONIDE 0.1 % EX CREA: TOPICAL_CREAM | Freq: Two times a day (BID) | CUTANEOUS | Status: DC

## 2012-09-23 NOTE — Assessment & Plan Note (Signed)
Left hip pain after fall from skateboard. Exam remarkable for edema over lateral proximal hip. Will request records on plain film and MRI of left hip that was performed.   Discussed that area of bruising may take several more weeks to completely heal. Will start Meloxicam. Recommended to apply ice to area for 10-20 min several times daily. If no improvement over next few weeks, will set up orthopedics referral.

## 2012-09-23 NOTE — Progress Notes (Signed)
  Subjective:    Patient ID: Jake Liu, male    DOB: 1960-11-27, 52 y.o.   MRN: [id number]  HPI 51YO male presents for acute visit c/o left hip pain after falling off skateboard 21 days ago. Fell, landing on left hip. Reports extensive swelling at time of incident. Was seen in ED, had plain film and MRI hip which reportedly showed bone bruise but no other injury. Had some bruising at site, which spread down left lateral thigh. Not currently taking any medication for pain. Pain gradually improving.  Outpatient Encounter Prescriptions as of 09/23/2012  Medication Sig Dispense Refill  . metoprolol succinate (TOPROL-XL) 50 MG 24 hr tablet Take 50 mg by mouth daily. Take with or immediately following a meal.      . oxyCODONE-acetaminophen (PERCOCET/ROXICET) 5-325 MG per tablet       . [DISCONTINUED] ibuprofen (ADVIL,MOTRIN) 800 MG tablet       . meloxicam (MOBIC) 15 MG tablet Take 1 tablet (15 mg total) by mouth daily.  30 tablet  0  . triamcinolone cream (KENALOG) 0.1 % Apply topically 2 (two) times daily.  30 g  0  . [DISCONTINUED] azithromycin (ZITHROMAX Z-PAK) 250 MG tablet Take 2 pills day 1, then 1 pill daily days 2-5  6 each  0   No facility-administered encounter medications on file as of 09/23/2012.   BP 140/88  Pulse 64  Temp(Src) 97.6 F (36.4 C) (Oral)  Wt 214 lb (97.07 kg)  BMI 29.02 kg/m2  SpO2 96%  Review of Systems  Constitutional: Negative for fever, chills, diaphoresis and fatigue.  Musculoskeletal: Positive for myalgias and arthralgias.  Skin: Positive for color change.       Objective:   Physical Exam  Constitutional: He is oriented to person, place, and time. He appears well-developed and well-nourished. No distress.  HENT:  Head: Normocephalic and atraumatic.  Right Ear: External ear normal.  Left Ear: External ear normal.  Nose: Nose normal.  Mouth/Throat: Oropharynx is clear and moist.  Eyes: Conjunctivae and EOM are normal. Pupils are equal, round, and  reactive to light. Right eye exhibits no discharge. Left eye exhibits no discharge. No scleral icterus.  Neck: Normal range of motion. Neck supple.  Cardiovascular:  No murmur heard. Pulmonary/Chest: Effort normal.  Musculoskeletal: Normal range of motion. He exhibits edema and tenderness.       Left hip: He exhibits tenderness, bony tenderness (lateral proximal femur) and swelling. He exhibits normal range of motion, normal strength, no crepitus and no deformity.       Legs: Neurological: He is alert and oriented to person, place, and time. No cranial nerve deficit. Coordination normal.  Skin: Skin is warm and dry. No rash noted. He is not diaphoretic. No erythema. No pallor.  Psychiatric: He has a normal mood and affect. His behavior is normal. Judgment and thought content normal.          Assessment & Plan:

## 2012-09-23 NOTE — Assessment & Plan Note (Signed)
Extensive ecchymosis after recent accident from left hip down left leg. Will check CBC with labs today. No h/o easy bleeding/bruising.

## 2012-10-21 ENCOUNTER — Other Ambulatory Visit (INDEPENDENT_AMBULATORY_CARE_PROVIDER_SITE_OTHER): Payer: PRIVATE HEALTH INSURANCE

## 2012-10-21 DIAGNOSIS — R58 Hemorrhage, not elsewhere classified: Secondary | ICD-10-CM

## 2012-10-21 DIAGNOSIS — I998 Other disorder of circulatory system: Secondary | ICD-10-CM

## 2012-10-21 LAB — COMPREHENSIVE METABOLIC PANEL
AST: 27 U/L (ref 0–37)
Albumin: 4.4 g/dL (ref 3.5–5.2)
Alkaline Phosphatase: 58 U/L (ref 39–117)
BUN: 15 mg/dL (ref 6–23)
Calcium: 9.4 mg/dL (ref 8.4–10.5)
Chloride: 99 mEq/L (ref 96–112)
Creatinine, Ser: 0.8 mg/dL (ref 0.4–1.5)
Glucose, Bld: 102 mg/dL — ABNORMAL HIGH (ref 70–99)

## 2012-10-22 ENCOUNTER — Encounter: Payer: Self-pay | Admitting: *Deleted

## 2012-10-25 ENCOUNTER — Telehealth: Payer: Self-pay | Admitting: Internal Medicine

## 2012-10-25 NOTE — Telephone Encounter (Signed)
Pt came by and got a copy of his last labs.  He would like someone to call him about the results

## 2012-10-27 NOTE — Telephone Encounter (Signed)
Left message to call back  

## 2012-11-01 NOTE — Telephone Encounter (Signed)
Patient never returned call  

## 2012-11-01 NOTE — Telephone Encounter (Signed)
Left message to call back  

## 2012-11-24 ENCOUNTER — Encounter: Payer: Self-pay | Admitting: Adult Health

## 2012-11-24 ENCOUNTER — Ambulatory Visit (INDEPENDENT_AMBULATORY_CARE_PROVIDER_SITE_OTHER): Payer: PRIVATE HEALTH INSURANCE | Admitting: Adult Health

## 2012-11-24 VITALS — BP 132/78 | HR 81 | Temp 97.7°F | Resp 14 | Wt 219.8 lb

## 2012-11-24 DIAGNOSIS — S0993XA Unspecified injury of face, initial encounter: Secondary | ICD-10-CM

## 2012-11-24 NOTE — Assessment & Plan Note (Signed)
Injury secondary to bite while he was eating. Anbesol prior to meals for comfort. Advised to suck on ice. Avoid re-injuring the area. May take ibuprofen for discomfort. Return to clinic if symptoms do not improve within 2 weeks.

## 2012-11-24 NOTE — Patient Instructions (Addendum)
  You can apply some over the counter Anbesol which is a numbing agent. Apply this right before you eat.  Suck on ice chips. Be careful not to re-injure the area.  Take ibuprofen for discomfort.  If your symptoms are not resolved completely within 2 weeks please let us know.

## 2012-11-24 NOTE — Progress Notes (Signed)
Patient ID: Json Koelzer, male   DOB: 12/02/1960, 52 y.o.   MRN: [id number]   HPI:  Patient is a pleasant 52 year old male who presents to clinic with injury to the left side of his tongue after biting it. The injury occurred while he was eating approximately 2-3 days ago.   Current Outpatient Prescriptions on File Prior to Visit  Medication Sig Dispense Refill  . metoprolol succinate (TOPROL-XL) 50 MG 24 hr tablet Take 50 mg by mouth daily. Take with or immediately following a meal.       No current facility-administered medications on file prior to visit.    ROS:  Tongue: red, swollen painful on left side  PE:  BP 132/78  Pulse 81  Temp(Src) 97.7 F (36.5 C) (Oral)  Resp 14  Wt 219 lb 12 oz (99.678 kg)  BMI 29.8 kg/m2  SpO2 96%   Tongue: erythema, slight irritation and inflammation with small ulceration on left anterior portion of tongue.

## 2013-01-03 ENCOUNTER — Encounter: Payer: Self-pay | Admitting: Cardiovascular Disease

## 2013-01-03 ENCOUNTER — Ambulatory Visit (INDEPENDENT_AMBULATORY_CARE_PROVIDER_SITE_OTHER): Payer: PRIVATE HEALTH INSURANCE | Admitting: Cardiovascular Disease

## 2013-01-03 VITALS — BP 122/84 | HR 62 | Ht 72.0 in | Wt 212.2 lb

## 2013-01-03 DIAGNOSIS — I429 Cardiomyopathy, unspecified: Secondary | ICD-10-CM

## 2013-01-03 DIAGNOSIS — I4891 Unspecified atrial fibrillation: Secondary | ICD-10-CM

## 2013-01-03 DIAGNOSIS — R000 Tachycardia, unspecified: Secondary | ICD-10-CM

## 2013-01-03 DIAGNOSIS — H538 Other visual disturbances: Secondary | ICD-10-CM | POA: Insufficient documentation

## 2013-01-03 DIAGNOSIS — I428 Other cardiomyopathies: Secondary | ICD-10-CM

## 2013-01-03 NOTE — Assessment & Plan Note (Signed)
This has been associated with transient vision loss. The etiology of this is not entirely clear. I will obtain carotid duplex ultrasound. I also advised him to followup with his ophthalmologist. If no other explanation is found, I recommend that he sees a neurologist.

## 2013-01-03 NOTE — Patient Instructions (Addendum)
Your physician has requested that you have a carotid duplex. This test is an ultrasound of the carotid arteries in your neck. It looks at blood flow through these arteries that supply the brain with blood. Allow one hour for this exam. There are no restrictions or special instructions.  Continue Metoprolol.   Get your eyes checked.   Follow up in 1 year.

## 2013-01-03 NOTE — Progress Notes (Signed)
HPI  Jake Liu is a pleasant 52 year old male who is here today for a followup visit. He presented in June, 2013 with palpitations associated with blurred vision and dizziness. He drove himself to [facility] where he was found to be in atrial fibrillation with rapid ventricular response. His heart rate 180 beats per minute. He had no previous arrhythmia prior to that. He had an echocardiogram done and was told that his ejection fraction was 45%. His ventricular rate was difficult to control and ultimately he underwent TEE guided cardioversion successfully. He was placed on Xarelto for anticoagulation. It was felt that his atrial fibrillation was possibly triggered by excessive caffeine and alcohol intake.  He cut down on both of these. After that, he complained of increased dyspnea and fatigue. He underwent evaluation with a treadmill nuclear stress test which showed no evidence of ischemia with normal ejection fraction. I took him off anticoagulation as well as losartan given his normal ejection fraction and no hypertension. He was continued on Toprol. I also asked him to take aspirin 81 mg once daily. He has not been taking that on a regular basis. He had recurrent episodes of blurred vision and dizziness of unclear etiology. This has not been associated with palpitations or tachycardia. I suggested an outpatient telemetry but he did not pursue this. It seems that these episodes though are not associated with tachycardia. It gets bad enough to the point of him having difficulty seeing things in that forces him to stop driving.  Allergies  Allergen Reactions  . Bactrim (Sulfamethoxazole-Tmp Ds)     Sulfa/Rash  . Lisinopril     cough     Current Outpatient Prescriptions on File Prior to Visit  Medication Sig Dispense Refill  . metoprolol succinate (TOPROL-XL) 50 MG 24 hr tablet Take 50 mg by mouth daily. Take with or immediately following a meal.       No current  facility-administered medications on file prior to visit.     Past Medical History  Diagnosis Date  . Sciatica   . Atrial fibrillation   . Cardiomyopathy     EF: 45%     Past Surgical History  Procedure Laterality Date  . Knee surgery       Family History  Problem Relation Age of Onset  . Heart disease Mother 35  . Asthma Father      History   Social History  . Marital Status: Single    Spouse Name: N/A    Number of Children: N/A  . Years of Education: N/A   Occupational History  . Not on file.   Social History Main Topics  . Smoking status: Never Smoker   . Smokeless tobacco: Never Used  . Alcohol Use: 3.5 oz/week    7 drink(s) per week     Comment: drinks at night.  Drinks 10 cups coffee daily  . Drug Use: No  . Sexually Active: Not on file   Other Topics Concern  . Not on file   Social History Narrative  . No narrative on file     PHYSICAL EXAM   BP 122/84  Pulse 62  Ht 6' (1.829 m)  Wt 212 lb 4 oz (96.276 kg)  BMI 28.78 kg/m2 Constitutional: He is oriented to person, place, and time. He appears well-developed and well-nourished. No distress.  HENT: No nasal discharge.  Head: Normocephalic and atraumatic.  Eyes: Pupils are equal and round. Right eye exhibits no discharge. Left eye  exhibits no discharge.  Neck: Normal range of motion. Neck supple. No JVD present. No thyromegaly present.  Cardiovascular: Normal rate, regular rhythm, normal heart sounds and. Exam reveals no gallop and no friction rub. No murmur heard.  Pulmonary/Chest: Effort normal and breath sounds normal. No stridor. No respiratory distress. He has no wheezes. He has no rales. He exhibits no tenderness.  Abdominal: Soft. Bowel sounds are normal. He exhibits no distension. There is no tenderness. There is no rebound and no guarding.  Musculoskeletal: Normal range of motion. He exhibits no edema and no tenderness.  Neurological: He is alert and oriented to person, place, and  time. Coordination normal.  Skin: Skin is warm and dry. No rash noted. He is not diaphoretic. No erythema. No pallor.  Psychiatric: He has a normal mood and affect. His behavior is normal. Judgment and thought content normal.     EKG: Sinus  Rhythm  Nonspecific T wave changes  BORDERLINE  ASSESSMENT AND PLAN

## 2013-01-03 NOTE — Assessment & Plan Note (Signed)
He continues to be in normal sinus rhythm. Continue treatment with metoprolol which seems to be well tolerated.

## 2013-01-03 NOTE — Assessment & Plan Note (Signed)
This was likely due to stress-induced cardiomyopathy in the setting of atrial fibrillation with rapid ventricular response. Ejection fraction went back to normal on nuclear stress test.

## 2013-01-10 ENCOUNTER — Ambulatory Visit: Payer: PRIVATE HEALTH INSURANCE | Admitting: Cardiovascular Disease

## 2013-01-14 ENCOUNTER — Telehealth: Payer: Self-pay | Admitting: Internal Medicine

## 2013-01-14 MED ORDER — METOPROLOL SUCCINATE ER 50 MG PO TB24: 50.0000 mg | ORAL_TABLET | Freq: Every day | ORAL | Status: DC

## 2013-01-14 NOTE — Telephone Encounter (Signed)
Pt came in to get refill on metrprolol  succ er 50 mg tab Pt stated he went by pharmacy they are waiting on office to call them Pt stated he is leaving to go out of town today and would need refill before leave [employer]

## 2013-01-14 NOTE — Telephone Encounter (Signed)
Prescription sent

## 2013-01-25 ENCOUNTER — Ambulatory Visit: Payer: PRIVATE HEALTH INSURANCE

## 2013-01-25 ENCOUNTER — Ambulatory Visit (INDEPENDENT_AMBULATORY_CARE_PROVIDER_SITE_OTHER): Payer: PRIVATE HEALTH INSURANCE | Admitting: Internal Medicine

## 2013-01-25 ENCOUNTER — Encounter: Payer: Self-pay | Admitting: Internal Medicine

## 2013-01-25 VITALS — BP 110/80 | HR 81 | Resp 14

## 2013-01-25 DIAGNOSIS — I4891 Unspecified atrial fibrillation: Secondary | ICD-10-CM

## 2013-01-25 DIAGNOSIS — R079 Chest pain, unspecified: Secondary | ICD-10-CM

## 2013-01-25 DIAGNOSIS — R0609 Other forms of dyspnea: Secondary | ICD-10-CM

## 2013-01-25 DIAGNOSIS — R0600 Dyspnea, unspecified: Secondary | ICD-10-CM | POA: Insufficient documentation

## 2013-01-25 NOTE — Assessment & Plan Note (Addendum)
Pt with h/o atrial fibrillation, presented as walk-in acute visit with chest pain, dyspnea, dizziness and palpitations. Exam remarkable for diaphoresis and tachycardia with irregularly irregular rhythm. EKG showed atrial fibrillation with RVR. Recommended transport by ambulance to ED for evaluation and likely cardioversion. EMS called and pt will be transferred to [facility] for evaluation.

## 2013-01-25 NOTE — Progress Notes (Signed)
  Subjective:    Patient ID: Jake Liu, male    DOB: 1961-07-24, 52 y.o.   MRN: [id number]  HPI 52 year old male with history of atrial fibrillation presents as a walk-in acute visit complaining of 24 hours of chest pain, shortness of breath, and palpitations. He reports that symptoms began suddenly yesterday around 1 PM. He developed rapid heart rate and substernal chest pain. Overnight, the pain kept him from sleeping. He has also been diaphoretic. He reports he has been compliant with metoprolol. He has not been on anticoagulation. Notably, he recently came back from vacation. In the past, he had atrial fibrillation with RVR requiring cardioversion.  Outpatient Encounter Prescriptions as of 01/25/2013  Medication Sig Dispense Refill  . metoprolol succinate (TOPROL-XL) 50 MG 24 hr tablet Take 1 tablet (50 mg total) by mouth daily. Take with or immediately following a meal.  30 tablet  3   No facility-administered encounter medications on file as of 01/25/2013.   BP 110/80  Pulse 81  Resp 14  SpO2 96%  Review of Systems  Constitutional: Positive for diaphoresis and fatigue. Negative for fever, chills, activity change, appetite change and unexpected weight change.  Eyes: Negative for visual disturbance.  Respiratory: Positive for shortness of breath. Negative for cough.   Cardiovascular: Positive for chest pain and palpitations. Negative for leg swelling.  Gastrointestinal: Negative for abdominal pain and abdominal distention.  Genitourinary: Negative for dysuria, urgency and difficulty urinating.  Musculoskeletal: Negative for arthralgias and gait problem.  Skin: Negative for color change and rash.  Hematological: Negative for adenopathy.  Psychiatric/Behavioral: Negative for sleep disturbance and dysphoric mood. The patient is not nervous/anxious.        Objective:   Physical Exam  Constitutional: He is oriented to person, place, and time. He appears well-developed and  well-nourished. He appears distressed.  HENT:  Head: Normocephalic and atraumatic.  Right Ear: External ear normal.  Left Ear: External ear normal.  Nose: Nose normal.  Mouth/Throat: Oropharynx is clear and moist. No oropharyngeal exudate.  Eyes: Conjunctivae and EOM are normal. Pupils are equal, round, and reactive to light. Right eye exhibits no discharge. Left eye exhibits no discharge. No scleral icterus.  Neck: Normal range of motion. Neck supple. No tracheal deviation present. No thyromegaly present.  Cardiovascular: Normal heart sounds.  An irregularly irregular rhythm present. Tachycardia present.  Exam reveals no gallop and no friction rub.   No murmur heard. Pulmonary/Chest: Effort normal and breath sounds normal. No respiratory distress. He has no wheezes. He has no rales. He exhibits no tenderness.  Musculoskeletal: Normal range of motion. He exhibits no edema.  Lymphadenopathy:    He has no cervical adenopathy.  Neurological: He is alert and oriented to person, place, and time. No cranial nerve deficit. Coordination normal.  Skin: Skin is warm. No rash noted. He is diaphoretic. No erythema. No pallor.  Psychiatric: He has a normal mood and affect. His behavior is normal. Judgment and thought content normal.          Assessment & Plan:

## 2013-02-16 ENCOUNTER — Encounter (INDEPENDENT_AMBULATORY_CARE_PROVIDER_SITE_OTHER): Payer: PRIVATE HEALTH INSURANCE

## 2013-02-16 DIAGNOSIS — H538 Other visual disturbances: Secondary | ICD-10-CM

## 2013-02-16 DIAGNOSIS — H532 Diplopia: Secondary | ICD-10-CM

## 2013-02-17 ENCOUNTER — Telehealth: Payer: Self-pay | Admitting: *Deleted

## 2013-02-17 NOTE — Telephone Encounter (Signed)
lmovm to make follow-up appointment with Dr. Kirke Corin in 2-3 weeks. Mylo Red RN

## 2013-02-24 ENCOUNTER — Telehealth: Payer: Self-pay | Admitting: *Deleted

## 2013-02-24 NOTE — Telephone Encounter (Signed)
I spoke with pt about scheduling appointment with Dr. Kirke Corin in the [place] office. He will call back later when he gets home to schedule follow-up post cardioversion appointment  Mylo Red RN

## 2013-02-24 NOTE — Telephone Encounter (Signed)
Message copied by Barrie Folk on Thu Feb 24, 2013  2:04 PM ------      Message from: Gerome Apley      Created: Thu Feb 17, 2013 10:54 AM      Regarding: FW: Pt info change---FYI                   ----- Message -----         From: Iran Ouch, MD         Sent: 02/16/2013  11:04 AM           To: [facility] T[facility]      Subject: FW: Pt info change---FYI                                 Schedule him for a follow up visit with me within 2-3 weeks. He should bring meds with him.             ----- Message -----         From: Milbert Coulter         Sent: 02/16/2013  10:49 AM           To: Iran Ouch, MD      Subject: Pt info change---FYI                                     Patient went to [facility] on 01/25/13 with recurrent a-fib with RVR.  He was shocked back into rhythm, and states they put him back on Xarelto---he thinks.  He does not have meds/dose with him, so I didn't change his medications list.      He feels fatigued, without any more blurred vision.  He will f/u with [facility] in September.      Missy       ------

## 2013-08-24 ENCOUNTER — Encounter: Payer: Self-pay | Admitting: Internal Medicine

## 2013-08-24 ENCOUNTER — Ambulatory Visit (INDEPENDENT_AMBULATORY_CARE_PROVIDER_SITE_OTHER): Payer: PRIVATE HEALTH INSURANCE | Admitting: Internal Medicine

## 2013-08-24 VITALS — BP 142/88 | HR 98 | Temp 98.0°F | Wt 225.5 lb

## 2013-08-24 DIAGNOSIS — J209 Acute bronchitis, unspecified: Secondary | ICD-10-CM

## 2013-08-24 MED ORDER — AZITHROMYCIN 250 MG PO TABS: ORAL_TABLET | ORAL | Status: DC

## 2013-08-24 MED ORDER — HYDROCODONE-HOMATROPINE 5-1.5 MG/5ML PO SYRP: 5.0000 mL | ORAL_SOLUTION | Freq: Three times a day (TID) | ORAL | Status: DC | PRN

## 2013-08-24 NOTE — Patient Instructions (Signed)
Acute Bronchitis Bronchitis is inflammation of the airways that extend from the windpipe into the lungs (bronchi). The inflammation often causes mucus to develop. This leads to a cough, which is the most common symptom of bronchitis.  In acute bronchitis, the condition usually develops suddenly and goes away over time, usually in a couple weeks. Smoking, allergies, and asthma can make bronchitis worse. Repeated episodes of bronchitis may cause further lung problems.  CAUSES Acute bronchitis is most often caused by the same virus that causes a cold. The virus can spread from person to person (contagious).  SIGNS AND SYMPTOMS   Cough.   Fever.   Coughing up mucus.   Body aches.   Chest congestion.   Chills.   Shortness of breath.   Sore throat.  DIAGNOSIS  Acute bronchitis is usually diagnosed through a physical exam. Tests, such as chest X-rays, are sometimes done to rule out other conditions.  TREATMENT  Acute bronchitis usually goes away in a couple weeks. Often times, no medical treatment is necessary. Medicines are sometimes given for relief of fever or cough. Antibiotics are usually not needed but may be prescribed in certain situations. In some cases, an inhaler may be recommended to help reduce shortness of breath and control the cough. A cool mist vaporizer may also be used to help thin bronchial secretions and make it easier to clear the chest.  HOME CARE INSTRUCTIONS  Get plenty of rest.   Drink enough fluids to keep your urine clear or pale yellow (unless you have a medical condition that requires fluid restriction). Increasing fluids may help thin your secretions and will prevent dehydration.   Only take over-the-counter or prescription medicines as directed by your health care provider.   Avoid smoking and secondhand smoke. Exposure to cigarette smoke or irritating chemicals will make bronchitis worse. If you are a smoker, consider using nicotine gum or skin  patches to help control withdrawal symptoms. Quitting smoking will help your lungs heal faster.   Reduce the chances of another bout of acute bronchitis by washing your hands frequently, avoiding people with cold symptoms, and trying not to touch your hands to your mouth, nose, or eyes.   Follow up with your health care provider as directed.  SEEK MEDICAL CARE IF: Your symptoms do not improve after 1 week of treatment.  SEEK IMMEDIATE MEDICAL CARE IF:  You develop an increased fever or chills.   You have chest pain.   You have severe shortness of breath.  You have bloody sputum.   You develop dehydration.  You develop fainting.  You develop repeated vomiting.  You develop a severe headache. MAKE SURE YOU:   Understand these instructions.  Will watch your condition.  Will get help right away if you are not doing well or get worse. Document Released: 08/21/2004 Document Revised: 03/16/2013 Document Reviewed: 01/04/2013 [employer] Patient Information 2014 [employer].  

## 2013-08-24 NOTE — Progress Notes (Signed)
Pre-visit discussion using our clinic review tool. No additional management support is needed unless otherwise documented below in the visit note.  

## 2013-08-24 NOTE — Progress Notes (Signed)
HPI  Pt presents to the clinic today with c/o cold symptoms x 4 days. He endorses cough with thick green sputum production. His chest feels burning when he cough and heavy. He endorses sore throat, chills, fatigue, nasal discharge. The cough is worse at night. He denies shortness of breath, difficulty breathing, or chest pain. He denies any sick contacts. He also endorses concern for a rash that appeared about 5 days ago. He states the rash does itch, no burning or pain noted.   Review of Systems      Past Medical History  Diagnosis Date  . Sciatica   . Atrial fibrillation   . Cardiomyopathy     EF: 45%    Family History  Problem Relation Age of Onset  . Heart disease Mother 104  . Asthma Father     History   Social History  . Marital Status: Single    Spouse Name: N/A    Number of Children: N/A  . Years of Education: N/A   Occupational History  . Not on file.   Social History Main Topics  . Smoking status: Never Smoker   . Smokeless tobacco: Never Used  . Alcohol Use: 3.5 oz/week    7 drink(s) per week     Comment: drinks at night.  Drinks 10 cups coffee daily  . Drug Use: No  . Sexual Activity: Not on file   Other Topics Concern  . Not on file   Social History Narrative  . No narrative on file    Allergies  Allergen Reactions  . Bactrim [Sulfamethoxazole-Tmp Ds]     Sulfa/Rash  . Lisinopril     cough     Constitutional: Positive headache and fatigue.  Denies fever and  abrupt weight changes.  HEENT:  Positive sore throat. Denies eye redness, eye pain, pressure behind the eyes, facial pain, nasal congestion, ear pain, ringing in the ears, wax buildup, runny nose or bloody nose. Respiratory: Positive cough and sputum production. Denies difficulty breathing or shortness of breath.  Cardiovascular: Denies chest pain, chest tightness, palpitations or swelling in the hands or feet.   No other specific complaints in a complete review of systems (except as  listed in HPI above).  Objective:   BP 142/88  Pulse 98  Temp(Src) 98 F (36.7 C) (Oral)  Wt 225 lb 8 oz (102.286 kg)  SpO2 98% Wt Readings from Last 3 Encounters:  08/24/13 225 lb 8 oz (102.286 kg)  01/03/13 212 lb 4 oz (96.276 kg)  11/24/12 219 lb 12 oz (99.678 kg)     General: Appears his stated age, well developed, well nourished in NAD. HEENT: Head: normal shape and size; Eyes: sclera white, no icterus, conjunctiva pink, PERRLA and EOMs intact; Ears: Tm's gray and intact, normal light reflex; Nose: mucosa pink and moist, septum midline; Throat/Mouth: + PND. Teeth present, mucosa erythematous and moist, no exudate noted, no lesions or ulcerations noted.  Neck: Mild cervical lymphadenopathy. Neck supple, trachea midline. No massses, lumps or thyromegaly present.  Cardiovascular: Normal rate and rhythm. S1,S2 noted.  No murmur, rubs or gallops noted. No JVD or BLE edema. No carotid bruits noted. Pulmonary/Chest: Normal effort and positive vesicular breath sounds. No respiratory distress. Bilateral rhonchi noted to upper lobes. No wheezing or crackles.  Skin: Red/dry/scaly rash to lower back right above buttock. No drainage noted     Assessment & Plan:   Acute Bronchitis  Get some rest and drink plenty of water Do salt water gargles  for the sore throat eRx for Azithromax x 5 days eRx for Hycodan cough syrup  RTC as needed or if symptoms persist.  Rash: Eczema Recommended Eucerin or Aveeno lotion to area  [place], [employer], Student-NP

## 2013-08-24 NOTE — Progress Notes (Signed)
HPI  Pt presents to the clinic today with c/o cough, shortness of breath, wheezing, and fatigue. This started 4 days ago. The cough is productive of thick green mucous. He also reports feeling a little light headed and chills. He denies any history of allergies or asthma. He has had sick contacts.  Additionally, he c/o a rash on his lower back. The rash is very itchy. He has not put anything on it.  Review of Systems      Past Medical History  Diagnosis Date  . Sciatica   . Atrial fibrillation   . Cardiomyopathy     EF: 45%    Family History  Problem Relation Age of Onset  . Heart disease Mother 78  . Asthma Father     History   Social History  . Marital Status: Single    Spouse Name: N/A    Number of Children: N/A  . Years of Education: N/A   Occupational History  . Not on file.   Social History Main Topics  . Smoking status: Never Smoker   . Smokeless tobacco: Never Used  . Alcohol Use: 3.5 oz/week    7 drink(s) per week     Comment: drinks at night.  Drinks 10 cups coffee daily  . Drug Use: No  . Sexual Activity: Not on file   Other Topics Concern  . Not on file   Social History Narrative  . No narrative on file    Allergies  Allergen Reactions  . Bactrim [Sulfamethoxazole-Tmp Ds]     Sulfa/Rash  . Lisinopril     cough     Constitutional: Positive headache, fatigue and fever. Denies abrupt weight changes.  Skin: Pt reports itchy rash on back. Denies lesion, ulcerations or abrasions.  HEENT:  Denies eye redness, eye pain, pressure behind the eyes, facial pain, nasal congestion, ear pain, ringing in the ears, wax buildup, runny nose or bloody nose. Respiratory: Positive cough. Denies difficulty breathing or shortness of breath.  Cardiovascular: Denies chest pain, chest tightness, palpitations or swelling in the hands or feet.   No other specific complaints in a complete review of systems (except as listed in HPI above).  Objective:   BP 142/88   Pulse 98  Temp(Src) 98 F (36.7 C) (Oral)  Wt 225 lb 8 oz (102.286 kg)  SpO2 98% Wt Readings from Last 3 Encounters:  08/24/13 225 lb 8 oz (102.286 kg)  01/03/13 212 lb 4 oz (96.276 kg)  11/24/12 219 lb 12 oz (99.678 kg)     General: Appears his stated age, well developed, well nourished in NAD. Skin: Large patch of dry skin noted on mid lower back, resembling eczema.  HEENT: Head: normal shape and size; Eyes: sclera white, no icterus, conjunctiva pink, PERRLA and EOMs intact; Ears: Tm's gray and intact, normal light reflex; Nose: mucosa pink and moist, septum midline; Throat/Mouth: + PND. Teeth present, mucosa erythematous and moist, no exudate noted, no lesions or ulcerations noted.  Neck: Mild cervical lymphadenopathy. Neck supple, trachea midline. No massses, lumps or thyromegaly present.  Cardiovascular: Normal rate and rhythm. S1,S2 noted.  No murmur, rubs or gallops noted. No JVD or BLE edema. No carotid bruits noted. Pulmonary/Chest: Normal effort and scattered rhonchi throughout. No respiratory distress. No wheezes, rales or ronchi noted.      Assessment & Plan:   Acute Bronchitis  Get some rest and drink plenty of water Ok to take Mucinex OTC eRx for Azithromax x 5 days eRx for [employer]  cough syrup  Eczema, lower back:  Avoid hot showers or baths Put aveeno or eucerin lotion on the area twice daily  RTC as needed or if symptoms persist.

## 2013-09-01 ENCOUNTER — Ambulatory Visit: Payer: PRIVATE HEALTH INSURANCE | Admitting: Internal Medicine

## 2013-12-07 ENCOUNTER — Ambulatory Visit (INDEPENDENT_AMBULATORY_CARE_PROVIDER_SITE_OTHER): Payer: BC Managed Care – PPO | Admitting: Internal Medicine

## 2013-12-07 ENCOUNTER — Encounter: Payer: Self-pay | Admitting: Internal Medicine

## 2013-12-07 VITALS — BP 130/90 | HR 71 | Temp 98.0°F | Ht 72.0 in | Wt 230.2 lb

## 2013-12-07 DIAGNOSIS — R5381 Other malaise: Secondary | ICD-10-CM

## 2013-12-07 DIAGNOSIS — K219 Gastro-esophageal reflux disease without esophagitis: Secondary | ICD-10-CM | POA: Insufficient documentation

## 2013-12-07 DIAGNOSIS — Z1211 Encounter for screening for malignant neoplasm of colon: Secondary | ICD-10-CM

## 2013-12-07 DIAGNOSIS — R5383 Other fatigue: Secondary | ICD-10-CM

## 2013-12-07 DIAGNOSIS — I4891 Unspecified atrial fibrillation: Secondary | ICD-10-CM

## 2013-12-07 LAB — COMPREHENSIVE METABOLIC PANEL
ALT: 38 U/L (ref 0–53)
AST: 27 U/L (ref 0–37)
Albumin: 4.2 g/dL (ref 3.5–5.2)
Alkaline Phosphatase: 50 U/L (ref 39–117)
BILIRUBIN TOTAL: 1.1 mg/dL (ref 0.2–1.2)
BUN: 14 mg/dL (ref 6–23)
CO2: 25 meq/L (ref 19–32)
CREATININE: 0.8 mg/dL (ref 0.4–1.5)
Calcium: 9.4 mg/dL (ref 8.4–10.5)
Chloride: 105 mEq/L (ref 96–112)
GFR: 108.77 mL/min (ref >60.00)
GLUCOSE: 104 mg/dL — AB (ref 70–99)
Potassium: 4.3 mEq/L (ref 3.5–5.1)
Sodium: 137 mEq/L (ref 135–145)
Total Protein: 7.4 g/dL (ref 6.0–8.3)

## 2013-12-07 LAB — LIPID PANEL
CHOLESTEROL: 240 mg/dL — AB (ref 0–200)
HDL: 59.3 mg/dL (ref >39.00)
LDL Cholesterol: 167 mg/dL — ABNORMAL HIGH (ref 0–99)
Total CHOL/HDL Ratio: 4
Triglycerides: 67 mg/dL (ref 0.0–149.0)
VLDL: 13.4 mg/dL (ref 0.0–40.0)

## 2013-12-07 LAB — TSH: TSH: 2 u[IU]/mL (ref 0.35–4.50)

## 2013-12-07 MED ORDER — PANTOPRAZOLE SODIUM 40 MG PO TBEC: 40.0000 mg | DELAYED_RELEASE_TABLET | Freq: Two times a day (BID) | ORAL | Status: DC

## 2013-12-07 NOTE — Progress Notes (Signed)
Subjective:    Patient ID: Jake Liu, male    DOB: Feb 21, 1961, 53 y.o.   MRN: [id number]  HPI 53YO male presents for acute visit. He has several concerns today.  GERD - Recent increase in symptoms of acid reflux. Especially when travelling and eating late at night.  Some nausea, with occasional vomiting. No hematemesis. Made worse by increased wine consumption at night. Not taking anything for this.  Fatigue - Concerned about fatigue. Snores at night. Questions if he may have sleep apnea. Generally goes to bed around 10pm to 12am. Wakes at 5:30am. Does not feel rested. Unable to participate in exercise such as running as he has in the past because of fatigue, dyspnea on exertion. Notes some weight gain.  AFIB - has not been consistently compliant with Xarelto or Metoprolol. Having some palpitations, rapid forceful beats and left lateral chest pain at times. Would like to follow up at [facility]. Would like to consider Ablation.  Review of Systems  Constitutional: Positive for fatigue. Negative for fever, chills, activity change, appetite change and unexpected weight change.  Eyes: Negative for visual disturbance.  Respiratory: Positive for shortness of breath. Negative for cough.   Cardiovascular: Positive for chest pain and palpitations. Negative for leg swelling.  Gastrointestinal: Positive for nausea, vomiting and abdominal pain. Negative for abdominal distention.  Genitourinary: Negative for dysuria, urgency and difficulty urinating.  Musculoskeletal: Negative for arthralgias and gait problem.  Skin: Negative for color change and rash.  Hematological: Negative for adenopathy.  Psychiatric/Behavioral: Negative for sleep disturbance and dysphoric mood. The patient is not nervous/anxious.        Objective:    BP 130/90  Pulse 71  Temp(Src) 98 F (36.7 C) (Oral)  Ht 6' (1.829 m)  Wt 230 lb 4 oz (104.441 kg)  BMI 31.22 kg/m2  SpO2 95% Physical Exam  Constitutional: He is  oriented to person, place, and time. He appears well-developed and well-nourished. No distress.  HENT:  Head: Normocephalic and atraumatic.  Right Ear: External ear normal.  Left Ear: External ear normal.  Nose: Nose normal.  Mouth/Throat: Oropharynx is clear and moist. No oropharyngeal exudate.  Eyes: Conjunctivae and EOM are normal. Pupils are equal, round, and reactive to light. Right eye exhibits no discharge. Left eye exhibits no discharge. No scleral icterus.  Neck: Normal range of motion. Neck supple. No tracheal deviation present. No thyromegaly present.  Cardiovascular: Normal rate, regular rhythm and normal heart sounds.  Exam reveals no gallop and no friction rub.   No murmur heard. Pulmonary/Chest: Effort normal and breath sounds normal. No accessory muscle usage. Not tachypneic. No respiratory distress. He has no decreased breath sounds. He has no wheezes. He has no rhonchi. He has no rales. He exhibits no tenderness.  Abdominal: Soft. Bowel sounds are normal. He exhibits no distension and no mass. There is no tenderness. There is no rebound and no guarding.  Musculoskeletal: Normal range of motion. He exhibits no edema.  Lymphadenopathy:    He has no cervical adenopathy.  Neurological: He is alert and oriented to person, place, and time. No cranial nerve deficit. Coordination normal.  Skin: Skin is warm and dry. No rash noted. He is not diaphoretic. No erythema. No pallor.  Psychiatric: His behavior is normal. Judgment and thought content normal. His mood appears anxious.          Assessment & Plan:   Problem List Items Addressed This Visit   Atrial fibrillation status post cardioversion - Primary  Paroxysmal afib. NSR on exam today. Strongly encouraged compliance with Xarelto and Metoprolol. Reviewed notes from [facility]. Will set up follow up with [name] to discuss ablation.     Relevant Medications      rivaroxaban (XARELTO) 20 MG TABS tablet    Other Relevant Orders      Ambulatory referral to Cardiology      Ambulatory referral to Sleep Studies      Comprehensive metabolic panel      Lipid panel      TSH   GERD (gastroesophageal reflux disease)     Encouraged avoidance of eating late at night and consumption of alcohol. Will start Pantoprazol. If symptoms persistent, discussed testing for H. Pylori. Also discussed referral to GI for possible endoscopy.    Relevant Medications      pantoprazole (PROTONIX) EC tablet   Other Relevant Orders      Ambulatory referral to Gastroenterology   Other malaise and fatigue     Generalized fatigue likely related to poorly controlled afib, excessive consumption of ETOH. Encouraged him to follow up with cardiology and stop drinking. Will check CMP, TSH with labs today. Will also set up sleep study.    Relevant Orders      Ambulatory referral to Sleep Studies   Special screening for malignant neoplasms, colon   Relevant Orders      Ambulatory referral to Gastroenterology       Return in about 4 weeks (around 01/04/2014) for Recheck.

## 2013-12-07 NOTE — Assessment & Plan Note (Signed)
Paroxysmal afib. NSR on exam today. Strongly encouraged compliance with Xarelto and Metoprolol. Reviewed notes from [facility]. Will set up follow up with [name] to discuss ablation.

## 2013-12-07 NOTE — Assessment & Plan Note (Signed)
Encouraged avoidance of eating late at night and consumption of alcohol. Will start Pantoprazol. If symptoms persistent, discussed testing for H. Pylori. Also discussed referral to GI for possible endoscopy.

## 2013-12-07 NOTE — Progress Notes (Signed)
Pre visit review using our clinic review tool, if applicable. No additional management support is needed unless otherwise documented below in the visit note. 

## 2013-12-07 NOTE — Assessment & Plan Note (Signed)
Generalized fatigue likely related to poorly controlled afib, excessive consumption of ETOH. Encouraged him to follow up with cardiology and stop drinking. Will check CMP, TSH with labs today. Will also set up sleep study.

## 2013-12-08 ENCOUNTER — Other Ambulatory Visit: Payer: Self-pay | Admitting: *Deleted

## 2013-12-08 MED ORDER — ATORVASTATIN CALCIUM 20 MG PO TABS: 20.0000 mg | ORAL_TABLET | Freq: Every day | ORAL | Status: DC

## 2013-12-26 ENCOUNTER — Telehealth: Payer: Self-pay | Admitting: Internal Medicine

## 2013-12-26 NOTE — Telephone Encounter (Signed)
Home sleep study showed moderate sleep apnea. We should set up follow up with pt to discuss and set up plan for home CPAP.

## 2013-12-26 NOTE — Telephone Encounter (Signed)
Notified pt and scheduled f/u appt

## 2014-01-10 ENCOUNTER — Encounter (INDEPENDENT_AMBULATORY_CARE_PROVIDER_SITE_OTHER): Payer: Self-pay

## 2014-01-10 ENCOUNTER — Ambulatory Visit (INDEPENDENT_AMBULATORY_CARE_PROVIDER_SITE_OTHER): Payer: BC Managed Care – PPO | Admitting: Internal Medicine

## 2014-01-10 ENCOUNTER — Encounter: Payer: Self-pay | Admitting: Internal Medicine

## 2014-01-10 VITALS — BP 130/82 | HR 67 | Temp 97.8°F | Ht 72.0 in | Wt 231.2 lb

## 2014-01-10 DIAGNOSIS — I4891 Unspecified atrial fibrillation: Secondary | ICD-10-CM

## 2014-01-10 DIAGNOSIS — M25519 Pain in unspecified shoulder: Secondary | ICD-10-CM

## 2014-01-10 DIAGNOSIS — G4733 Obstructive sleep apnea (adult) (pediatric): Secondary | ICD-10-CM | POA: Insufficient documentation

## 2014-01-10 DIAGNOSIS — M25511 Pain in right shoulder: Secondary | ICD-10-CM | POA: Insufficient documentation

## 2014-01-10 NOTE — Progress Notes (Signed)
Subjective:    Patient ID: Jake Liu, male    DOB: May 11, 1961, 53 y.o.   MRN: [id number]  HPI 53YO male presents for follow up.  OSA - Recent sleep study confirmed moderate OSA. He continues to describe poor, restless sleep. He notes increased stress and travel with work commitments. He continues to drink 3-4 glasses of wine at night when traveling. He has not been exercising.  Right shoulder pain - noted over the last few weeks. Describes aching or sometimes sharp pain with abduction of right arm and posterior rotation. Has been unable to participate in some sports because of this, including pitching for local baseball league. No weakness noted. Not taking anything for this.  Afib - Was recently seen at [facility]. Notes reviewed today. Plan is for loop monitor x 1 month and then ablation if needed. Continues on Xarelto and Metoprolol. After late nights out with clients, he continues to have palpitations and irregular heart beat.  Review of Systems  Constitutional: Positive for fatigue. Negative for fever, chills, activity change, appetite change and unexpected weight change.  Eyes: Negative for visual disturbance.  Respiratory: Negative for cough and shortness of breath.   Cardiovascular: Positive for palpitations. Negative for chest pain and leg swelling.  Gastrointestinal: Negative for abdominal pain and abdominal distention.  Genitourinary: Negative for dysuria, urgency and difficulty urinating.  Musculoskeletal: Positive for arthralgias and myalgias. Negative for gait problem.  Skin: Negative for color change and rash.  Hematological: Negative for adenopathy.  Psychiatric/Behavioral: Positive for sleep disturbance. Negative for dysphoric mood. The patient is not nervous/anxious.        Objective:    BP 130/82  Pulse 67  Temp(Src) 97.8 F (36.6 C) (Oral)  Ht 6' (1.829 m)  Wt 231 lb 4 oz (104.894 kg)  BMI 31.36 kg/m2  SpO2 96% Physical Exam  Constitutional: He is oriented to  person, place, and time. He appears well-developed and well-nourished. No distress.  HENT:  Head: Normocephalic and atraumatic.  Right Ear: External ear normal.  Left Ear: External ear normal.  Nose: Nose normal.  Mouth/Throat: Oropharynx is clear and moist. No oropharyngeal exudate.  Eyes: Conjunctivae and EOM are normal. Pupils are equal, round, and reactive to light. Right eye exhibits no discharge. Left eye exhibits no discharge. No scleral icterus.  Neck: Normal range of motion. Neck supple. No tracheal deviation present. No thyromegaly present.  Cardiovascular: Normal rate, regular rhythm and normal heart sounds.  Exam reveals no gallop and no friction rub.   No murmur heard. Pulmonary/Chest: Effort normal and breath sounds normal. No accessory muscle usage. Not tachypneic. No respiratory distress. He has no decreased breath sounds. He has no wheezes. He has no rhonchi. He has no rales. He exhibits no tenderness.  Musculoskeletal: Normal range of motion. He exhibits no edema.  Lymphadenopathy:    He has no cervical adenopathy.  Neurological: He is alert and oriented to person, place, and time. No cranial nerve deficit. Coordination normal.  Skin: Skin is warm and dry. No rash noted. He is not diaphoretic. No erythema. No pallor.  Psychiatric: His behavior is normal. Judgment and thought content normal. His mood appears anxious.          Assessment & Plan:   Problem List Items Addressed This Visit     Unprioritized   Atrial fibrillation status post cardioversion     Reviewed notes from [facility] today. Plan for Loop monitor x 30d, then possible ablation. Continue Xarelto and Metoprolol.  Obstructive sleep apnea - Primary     Reviewed sleep study results. He is anxious about starting CPAP because of wearing mask. Will set up evaluation with sleep specialist at [facility]. Discussed risks of untreated sleep apnea including worsening control of afib and heart failure.    Relevant Orders        Ambulatory referral to Sleep Studies   Right shoulder pain     Right shoulder pain concerning for rotator cuff strain or tear. Will set up sports medicine evaluation. He will limit pitching activity until evaluation complete.     Relevant Orders      Ambulatory referral to Sports Medicine       Return in about 3 months (around 04/12/2014).

## 2014-01-10 NOTE — Assessment & Plan Note (Signed)
Right shoulder pain concerning for rotator cuff strain or tear. Will set up sports medicine evaluation. He will limit pitching activity until evaluation complete.

## 2014-01-10 NOTE — Assessment & Plan Note (Signed)
Reviewed sleep study results. He is anxious about starting CPAP because of wearing mask. Will set up evaluation with sleep specialist at [facility]. Discussed risks of untreated sleep apnea including worsening control of afib and heart failure.

## 2014-01-10 NOTE — Progress Notes (Signed)
Pre visit review using our clinic review tool, if applicable. No additional management support is needed unless otherwise documented below in the visit note. 

## 2014-01-10 NOTE — Assessment & Plan Note (Signed)
Reviewed notes from [facility] today. Plan for Loop monitor x 30d, then possible ablation. Continue Xarelto and Metoprolol.

## 2014-01-16 ENCOUNTER — Ambulatory Visit: Payer: BC Managed Care – PPO | Admitting: Family Medicine

## 2014-01-24 ENCOUNTER — Other Ambulatory Visit (INDEPENDENT_AMBULATORY_CARE_PROVIDER_SITE_OTHER): Payer: BC Managed Care – PPO

## 2014-01-24 ENCOUNTER — Ambulatory Visit (INDEPENDENT_AMBULATORY_CARE_PROVIDER_SITE_OTHER): Payer: BC Managed Care – PPO | Admitting: Family Medicine

## 2014-01-24 ENCOUNTER — Encounter: Payer: Self-pay | Admitting: Family Medicine

## 2014-01-24 VITALS — BP 140/86 | HR 74 | Ht 72.0 in | Wt 234.0 lb

## 2014-01-24 DIAGNOSIS — M25519 Pain in unspecified shoulder: Secondary | ICD-10-CM

## 2014-01-24 DIAGNOSIS — M25511 Pain in right shoulder: Secondary | ICD-10-CM

## 2014-01-24 DIAGNOSIS — IMO0002 Reserved for concepts with insufficient information to code with codable children: Secondary | ICD-10-CM | POA: Insufficient documentation

## 2014-01-24 DIAGNOSIS — M75100 Unspecified rotator cuff tear or rupture of unspecified shoulder, not specified as traumatic: Secondary | ICD-10-CM

## 2014-01-24 DIAGNOSIS — M7551 Bursitis of right shoulder: Secondary | ICD-10-CM

## 2014-01-24 NOTE — Assessment & Plan Note (Signed)
We discussed different treatment options the patient. Because this is his dominant arm as well as patient being on anticoagulant we felt that injection was the best opportunity for patient to heal nicely. Patient was given injection had near complete resolution of pain immediately. Patient was given home exercise program to do 3 times a week and we discussed over-the-counter medications and to be beneficial. We discussed an icing protocol as well. Patient will follow up in 3 weeks for further evaluation and treatment.

## 2014-01-24 NOTE — Patient Instructions (Signed)
Nice to meet you Try exercises at least 3 times a week Ice 20 minutes 2 times a day can help.  Turmeric 500mg  twice daily.  You can throw in 2 days but would start at 50% and increase 10 % a week.  Come back in 3 weeks if not perfect

## 2014-01-24 NOTE — Progress Notes (Signed)
[facility] [address], [place] [address] Phone: [phone] Subjective:    I'm seeing this patient by the request  of:  Rica Mast, MD   CC: Right shoulder pain  [id number] Jake Liu is a 53 y.o. male coming in with complaint of right shoulder pain. Patient has had this pain for multiple weeks if not months. Patient states that he does have a sharp severe pain with certain movements especially reaching above his head or trying to reach for something behind him. 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 Patient states since that time he has not been able to secondary to this pain. Patient denies any radiation down the arm or any numbness or weakness. Patient does not remember injuring the shoulder previously. Patient rates the severity of 7/10. Has tried some home modalities with minimal benefit. Sometimes it has wake him up at night.     Past medical history, social, surgical and family history all reviewed in electronic medical record.   Review of Systems: No headache, visual changes, nausea, vomiting, diarrhea, constipation, dizziness, abdominal pain, skin rash, fevers, chills, night sweats, weight loss, swollen lymph nodes, body aches, joint swelling, muscle aches, chest pain, shortness of breath, mood changes.   Objective Blood pressure 140/86, pulse 74, height 6' (1.829 m), weight 234 lb (106.142 kg), SpO2 95.00%.  General: No apparent distress alert and oriented x3 mood and affect normal, dressed appropriately.  HEENT: Pupils equal, extraocular movements intact  Respiratory: Patient's speak in full sentences and does not appear short of breath  Cardiovascular: No lower extremity edema, non tender, no erythema  Skin: Warm dry intact with no signs of infection or rash on extremities or on axial skeleton.  Abdomen: Soft nontender  Neuro: Cranial nerves II through XII are intact,  neurovascularly intact in all extremities with 2+ DTRs and 2+ pulses.  Lymph: No lymphadenopathy of posterior or anterior cervical chain or axillae bilaterally.  Gait normal with good balance and coordination.  MSK:  Non tender with full range of motion and good stability and symmetric strength and tone of  elbows, wrist, hip, knee and ankles bilaterally.  Shoulder: Right Inspection reveals no abnormalities, atrophy or asymmetry. Palpation is normal with no tenderness over AC joint or bicipital groove. ROM is full in all planes passively. Rotator cuff strength normal throughout. signs of impingement with positive Neer and Hawkin's tests, but negative empty can sign. Speeds and Yergason's tests normal. No labral pathology noted with negative Obrien's, negative clunk and good stability. Normal scapular function observed. No painful arc and no drop arm sign. No apprehension sign  MSK US performed of: Right This study was ordered, performed, and interpreted by Charlann Boxer D.O.  Shoulder:   Supraspinatus:  Tendon does have a very small intersubstance tear approximately 0.2 cm in length with no retraction., Bursal bulge seen with shoulder abduction on impingement view. Infraspinatus:  Appears normal on long and transverse views. Significant increase in Doppler flow Subscapularis:  Appears normal on long and transverse views. Positive bursa Teres Minor:  Appears normal on long and transverse views. AC joint:  Capsule undistended, no geyser sign. Glenohumeral Joint:  Appears normal without effusion. Glenoid Labrum:  Intact without visualized tears. Biceps Tendon:  Appears normal on long and transverse views, no fraying of tendon, tendon located in intertubercular groove, no subluxation with shoulder internal or external rotation.  Impression: Subacromial bursitis with small intersubstance tearing of the  supraspinatus.  Procedure: Real-time Ultrasound Guided Injection of right glenohumeral  joint Device: GE Logiq E  Ultrasound guided injection is preferred based studies that show increased duration, increased effect, greater accuracy, decreased procedural pain, increased response rate with ultrasound guided versus blind injection.  Verbal informed consent obtained.  Time-out conducted.  Noted no overlying erythema, induration, or other signs of local infection.  Skin prepped in a sterile fashion.  Local anesthesia: Topical Ethyl chloride.  With sterile technique and under real time ultrasound guidance:  Joint visualized.  23g 1  inch needle inserted posterior approach. Pictures taken for needle placement. Patient did have injection of 2 cc of 1% lidocaine, 2 cc of 0.5% Marcaine, and 1.0 cc of Kenalog 40 mg/dL. Completed without difficulty  Pain immediately resolved suggesting accurate placement of the medication.  Advised to call if fevers/chills, erythema, induration, drainage, or persistent bleeding.  Images permanently stored and available for review in the ultrasound unit.  Impression: Technically successful ultrasound guided injection.    Impression and Recommendations:     This case required medical decision making of moderate complexity.

## 2014-02-14 ENCOUNTER — Ambulatory Visit: Payer: BC Managed Care – PPO | Admitting: Family Medicine

## 2014-02-15 ENCOUNTER — Ambulatory Visit (INDEPENDENT_AMBULATORY_CARE_PROVIDER_SITE_OTHER): Payer: BC Managed Care – PPO | Admitting: Family Medicine

## 2014-02-15 ENCOUNTER — Other Ambulatory Visit (INDEPENDENT_AMBULATORY_CARE_PROVIDER_SITE_OTHER): Payer: BC Managed Care – PPO

## 2014-02-15 ENCOUNTER — Encounter: Payer: Self-pay | Admitting: Family Medicine

## 2014-02-15 VITALS — BP 142/84 | HR 91 | Ht 72.0 in | Wt 231.0 lb

## 2014-02-15 DIAGNOSIS — M25519 Pain in unspecified shoulder: Secondary | ICD-10-CM

## 2014-02-15 DIAGNOSIS — S46819A Strain of other muscles, fascia and tendons at shoulder and upper arm level, unspecified arm, initial encounter: Secondary | ICD-10-CM | POA: Insufficient documentation

## 2014-02-15 DIAGNOSIS — S43499A Other sprain of unspecified shoulder joint, initial encounter: Secondary | ICD-10-CM

## 2014-02-15 DIAGNOSIS — M25511 Pain in right shoulder: Secondary | ICD-10-CM

## 2014-02-15 DIAGNOSIS — S46212A Strain of muscle, fascia and tendon of other parts of biceps, left arm, initial encounter: Secondary | ICD-10-CM

## 2014-02-15 MED ORDER — TRAMADOL HCL 50 MG PO TABS: 50.0000 mg | ORAL_TABLET | Freq: Every evening | ORAL | Status: DC | PRN

## 2014-02-15 NOTE — Patient Instructions (Addendum)
It is good to see you.  Turmeric 500mg  daily.  Tylenol 650mg  three times daily.  Wear compression daily  Tramadol at night Lifting limit of 20# next 3 weeks.  Ice 20 minutes 2 times daily can help.  Come back in 3 weeks.

## 2014-02-15 NOTE — Progress Notes (Signed)
[facility] [address] [place], [place] [address] Phone: [phone] Subjective:      CC: Right shoulder pain follow up  NTI:[id number] Jake Liu is a 53 y.o. male coming in with complaint of right shoulder pain. Patient was found previously to have small intersubstance tearing of the supraspinatus tendon as well as a subacromial bursitis. Patient did have ultrasound-guided injection and was started on a home exercise program in addition to an icing protocol and some over-the-counter medications. Patient states he is doing significantly better. Patient went to pick up something and started having more of an anterior shoulder pain. Patient states it is more of a tearing sensation. Patient states this happened greater than 5 days ago. Patient states that the pain is starting to get better but very slowly. Patient states that he cannot lift anything heavy with this arm. States that it is worse when he has his palm up. States that the pain can wake him up at night. Has rely on ibuprofen even though he is on a blood thinner.     Past medical history, social, surgical and family history all reviewed in electronic medical record.   Review of Systems: No headache, visual changes, nausea, vomiting, diarrhea, constipation, dizziness, abdominal pain, skin rash, fevers, chills, night sweats, weight loss, swollen lymph nodes, body aches, joint swelling, muscle aches, chest pain, shortness of breath, mood changes.   Objective Blood pressure 142/84, pulse 91, height 6' (1.829 m), weight 231 lb (104.781 kg), SpO2 97.00%.  General: No apparent distress alert and oriented x3 mood and affect normal, dressed appropriately.  HEENT: Pupils equal, extraocular movements intact  Respiratory: Patient's speak in full sentences and does not appear short of breath  Cardiovascular: No lower extremity edema, non tender, no erythema  Skin: Warm dry intact with no signs of infection or  rash on extremities or on axial skeleton.  Abdomen: Soft nontender  Neuro: Cranial nerves II through XII are intact, neurovascularly intact in all extremities with 2+ DTRs and 2+ pulses.  Lymph: No lymphadenopathy of posterior or anterior cervical chain or axillae bilaterally.  Gait normal with good balance and coordination.  MSK:  Non tender with full range of motion and good stability and symmetric strength and tone of  elbows, wrist, hip, knee and ankles bilaterally.  Shoulder: Right Inspection reveals no abnormalities, atrophy or asymmetry. Palpation is normal with no tenderness over AC joint or bicipital groove. ROM is full in all planes passively. Rotator cuff strength normal throughout. signs of impingement with positive Neer and Hawkin's tests, but negative empty can sign. Significantly better than previous exam though. Speeds and Yergason's tests positive No labral pathology noted with negative Obrien's, negative clunk and good stability. Normal scapular function observed. No painful arc and no drop arm sign. No apprehension sign Contralateral shoulder unremarkable  MSK US performed of: The right shoulder This study was ordered, performed, and interpreted by Charlann Boxer D.O.  Shoulder:   Supraspinatus:  Appears normal on long and transverse views, no bursal bulge seen with shoulder abduction on impingement view. Infraspinatus:  Appears normal on long and transverse views. Subscapularis:  Appears normal on long and transverse views. Teres Minor:  Appears normal on long and transverse views. AC joint:  Capsule undistended, no geyser sign. Glenohumeral Joint:  Appears normal without effusion. Glenoid Labrum:  Intact without visualized tears. Biceps Tendon:  Patient has what appears to be a partial tear of the bicep tendon as well as in the  bicep muscle. Moderate hypoechoic changes. And increasing Doppler flow noted. Impression: Partial bicep tendon and muscle tear.   Impression  and Recommendations:     This case required medical decision making of moderate complexity.

## 2014-02-15 NOTE — Assessment & Plan Note (Signed)
Patient does have what appears to be a tear of the bicep tendon. This is partial in nature. Patient does have some mild defect of the muscle belly as well today. Patient was given home exercise program to start in one week. Patient is going to do an icing regimen and patient was given a compression sleeve. He'll avoid any heavy lifting over the course of the next several weeks. Patient will come back and see me again in 23 weeks to make sure that there is proper healing. The patient continues to have pain and no significant improvement further imaging such as an MRI may be necessary for further evaluation. Medications per orders  Spent greater than 25 minutes with patient face-to-face and had greater than 50% of counseling including as described above in assessment and plan.

## 2014-03-04 ENCOUNTER — Emergency Department: Payer: Self-pay | Admitting: Emergency Medicine

## 2014-03-04 IMAGING — CR DG CHEST 2V
1 series · 2 of 2 positions shown · non-contrast
Comparison: None

CLINICAL DATA: Chest pain.

EXAM:
CHEST - 2 VIEW

[Series 1: w chest pa · 0.14mm/px · 2 of 2 slices shown]
[im 1/2]
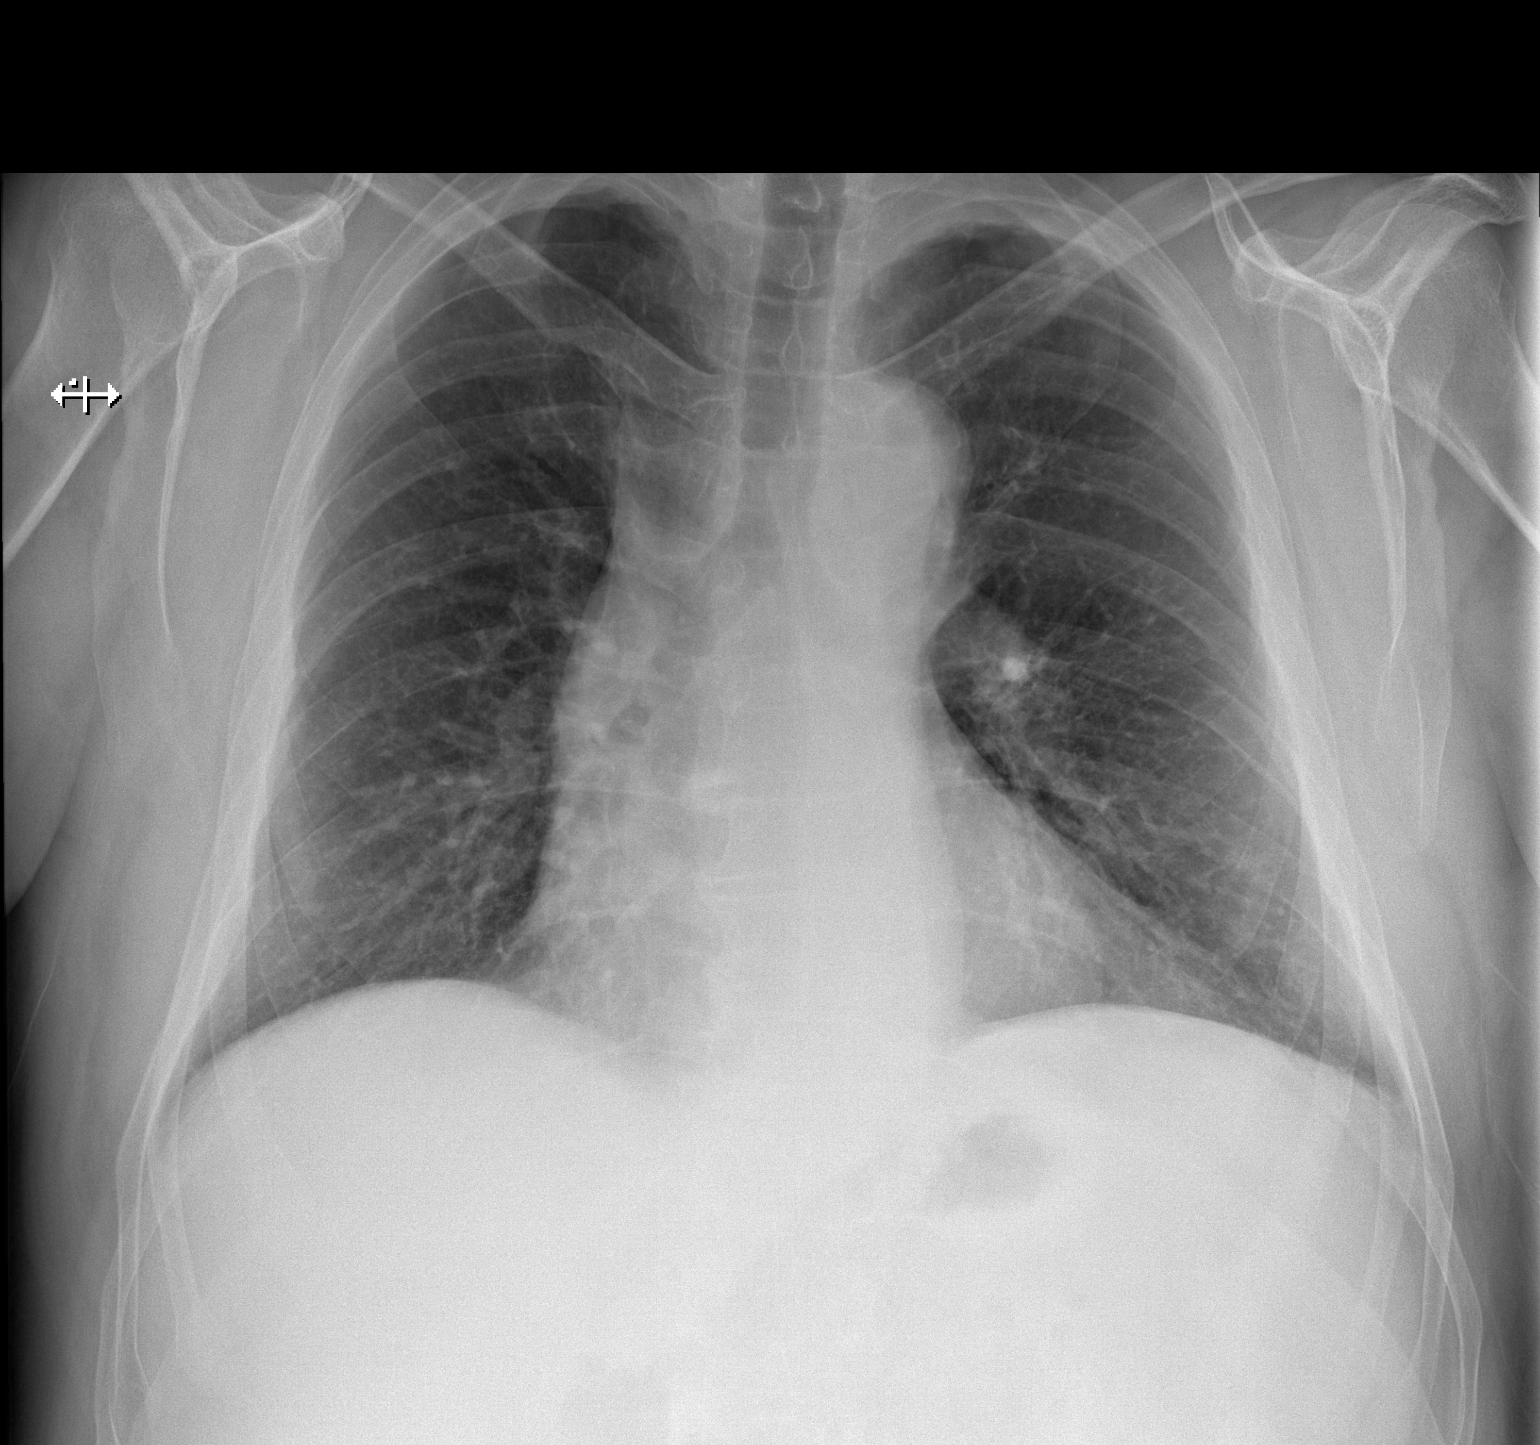
[im 2/2]
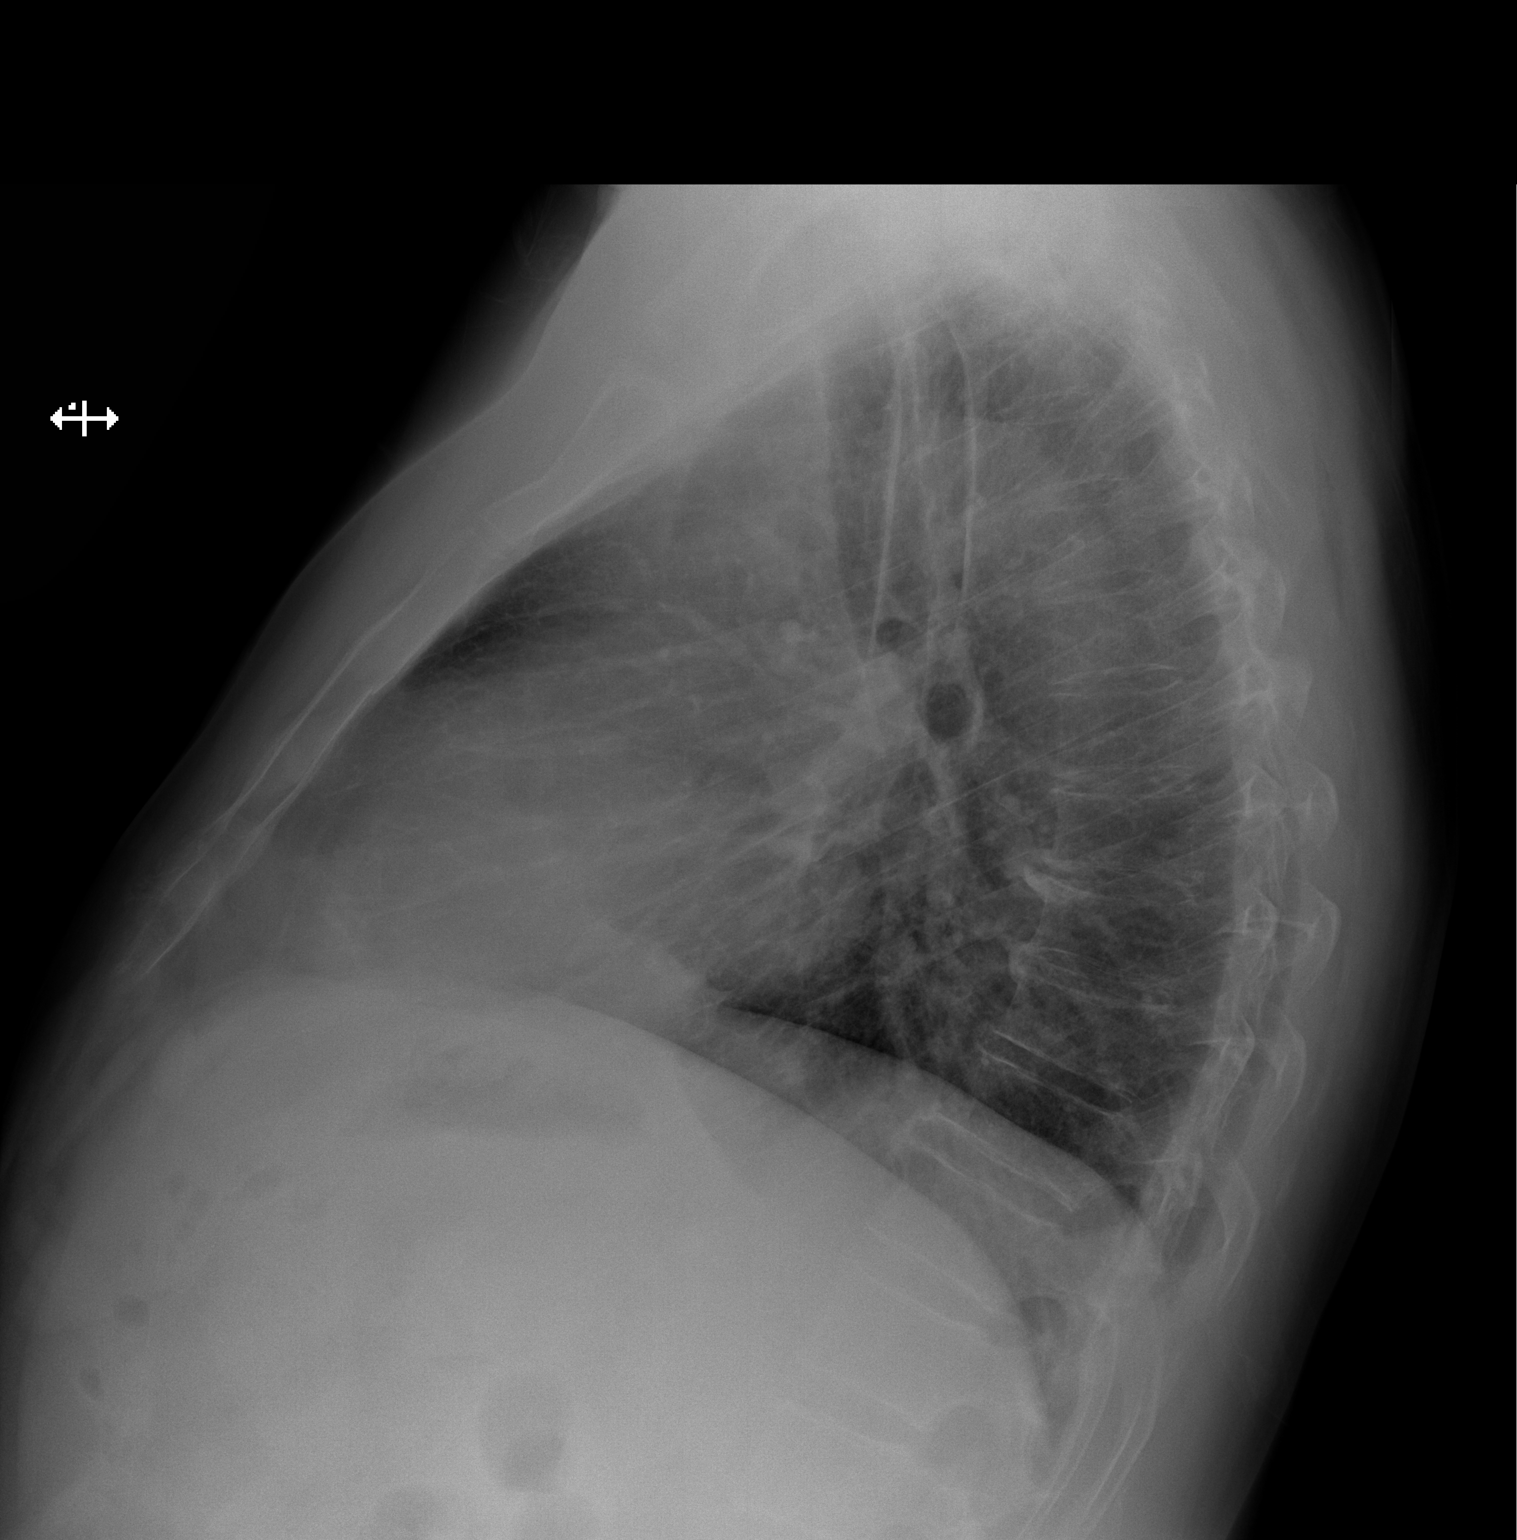

[2 of 2 positions shown; findings below may reference images not displayed]

FINDINGS: The heart size and mediastinal contours are within normal limits.
Mild bibasilar scarring/atelectasis. There is no evidence of
pulmonary edema, consolidation, pneumothorax, nodule or pleural
fluid. Mild degenerative disease of the thoracic spine.
IMPRESSION: No active disease.

## 2014-03-04 IMAGING — CR DG SHOULDER 3+V*R*
1 series · 4 of 4 positions shown · non-contrast
Comparison: Chest radiographs from the same day reported
separately.

CLINICAL DATA: 52-year-old male with pain after fall. Initial
encounter.

EXAM:
DG SHOULDER 3+ VIEWS RIGHT

[Series 1: w shoulder grashey right · 0.14mm/px · 4 of 4 slices shown]
[im 1/4]
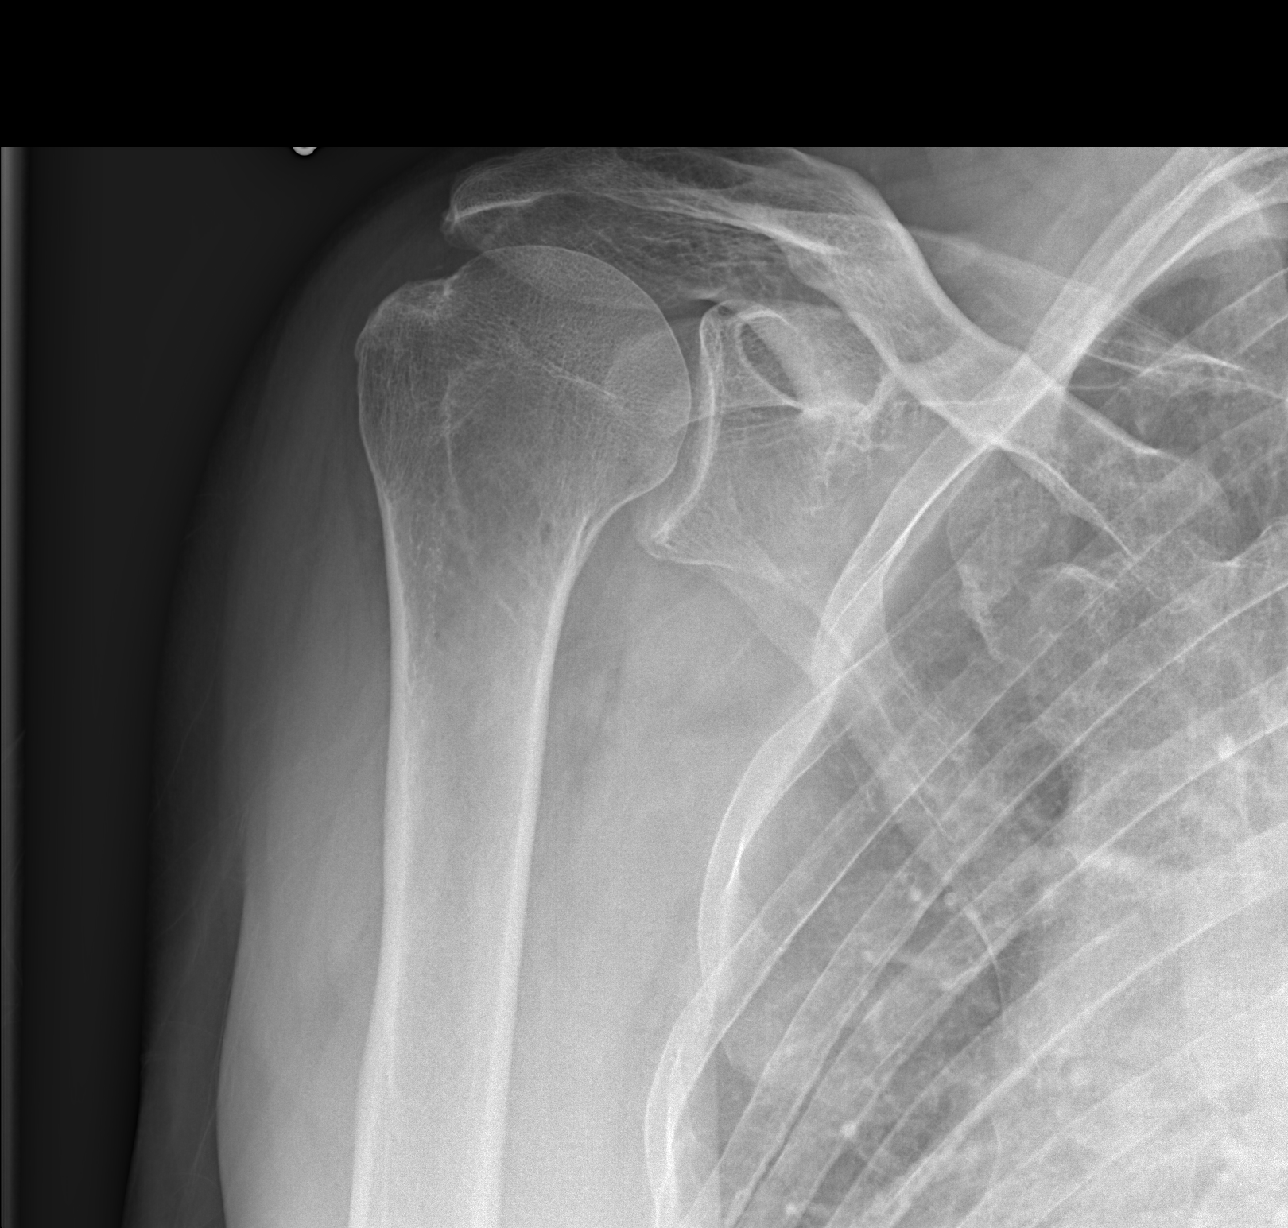
[im 2/4]
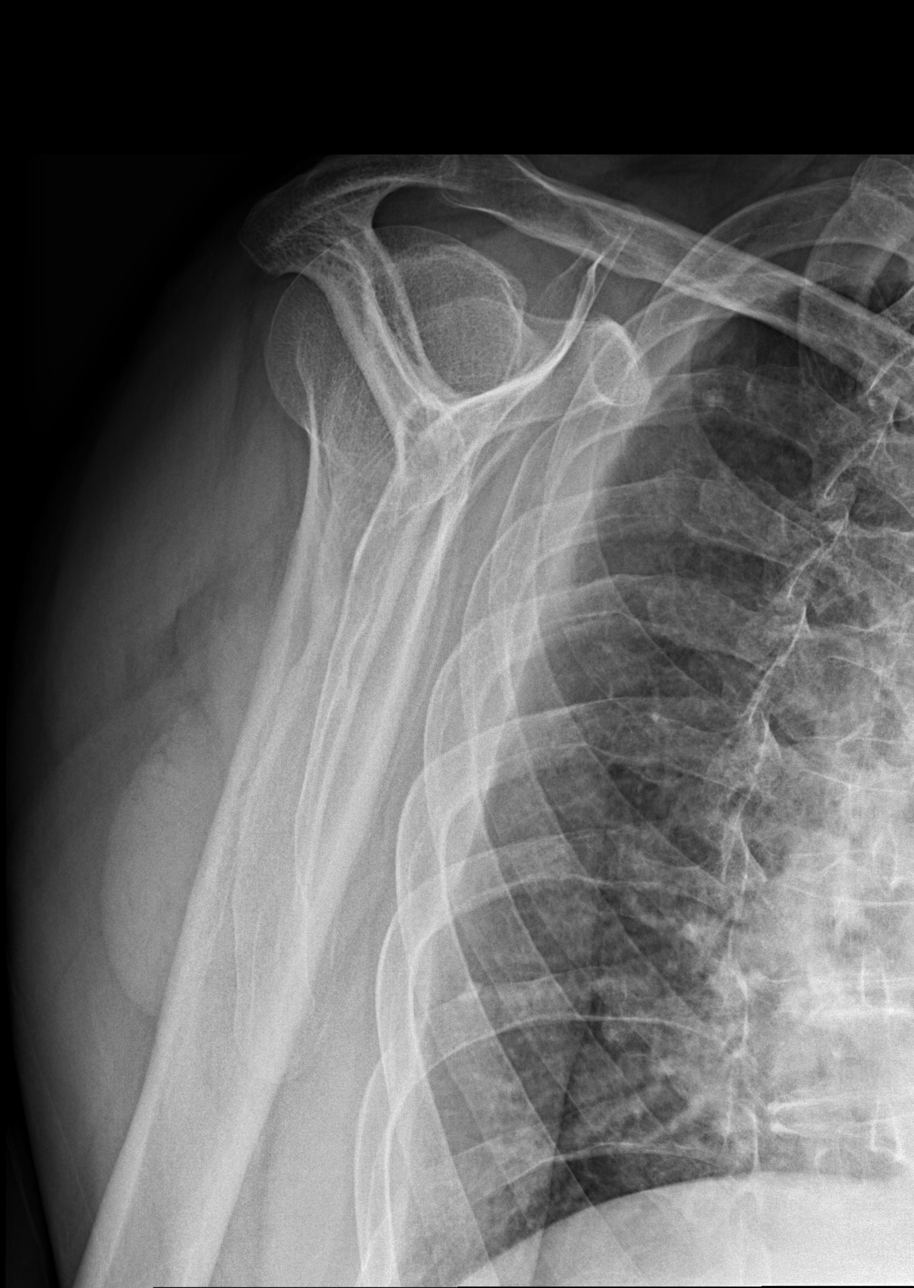
[im 3/4]
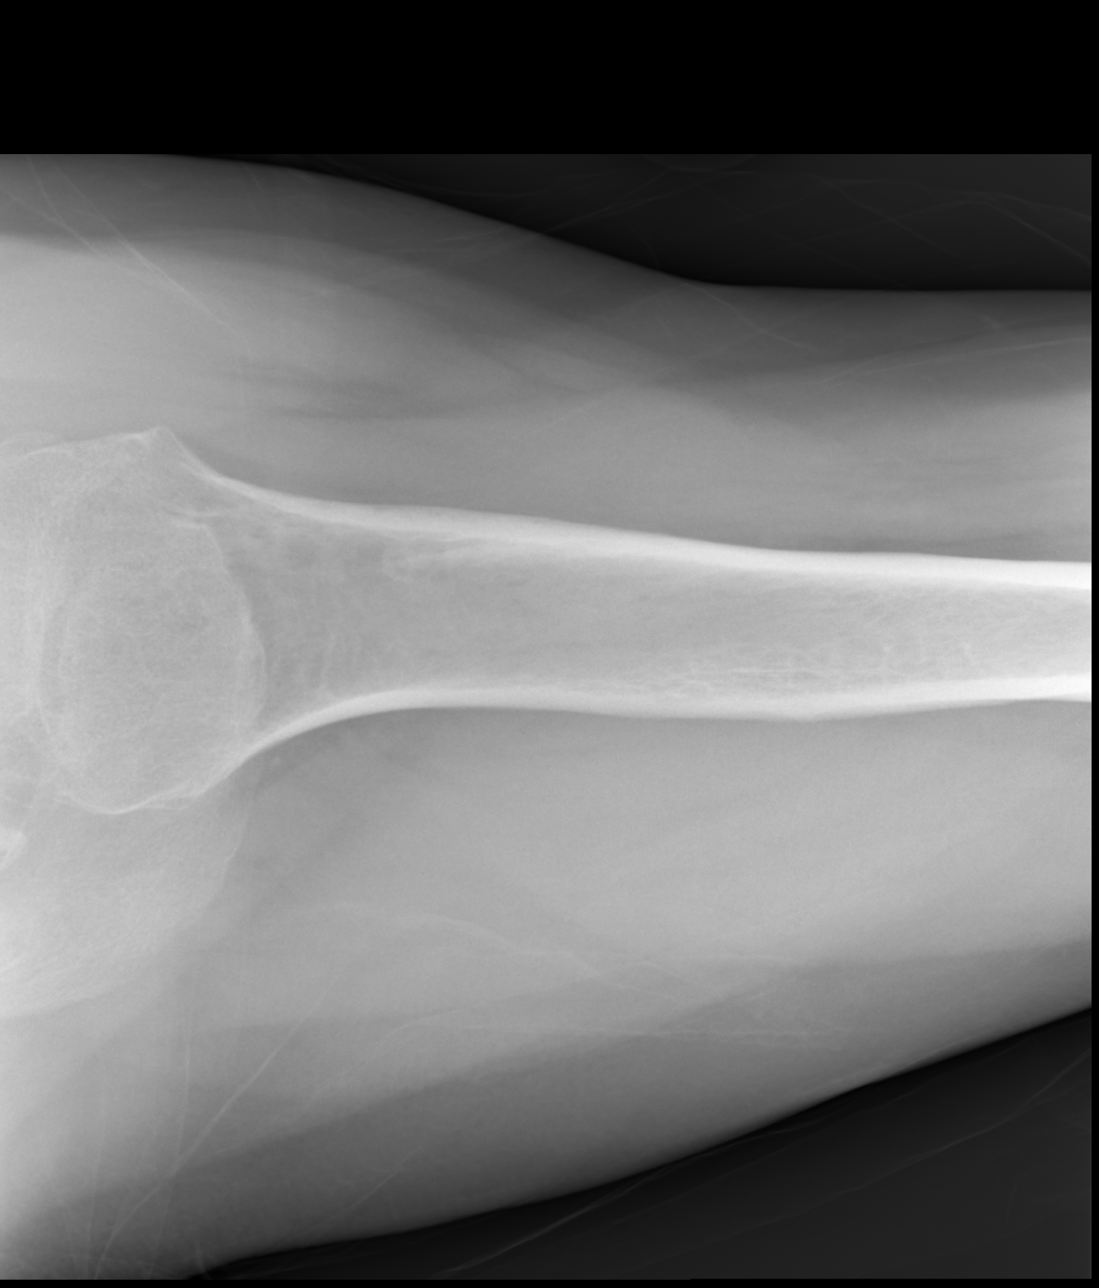
[im 4/4]
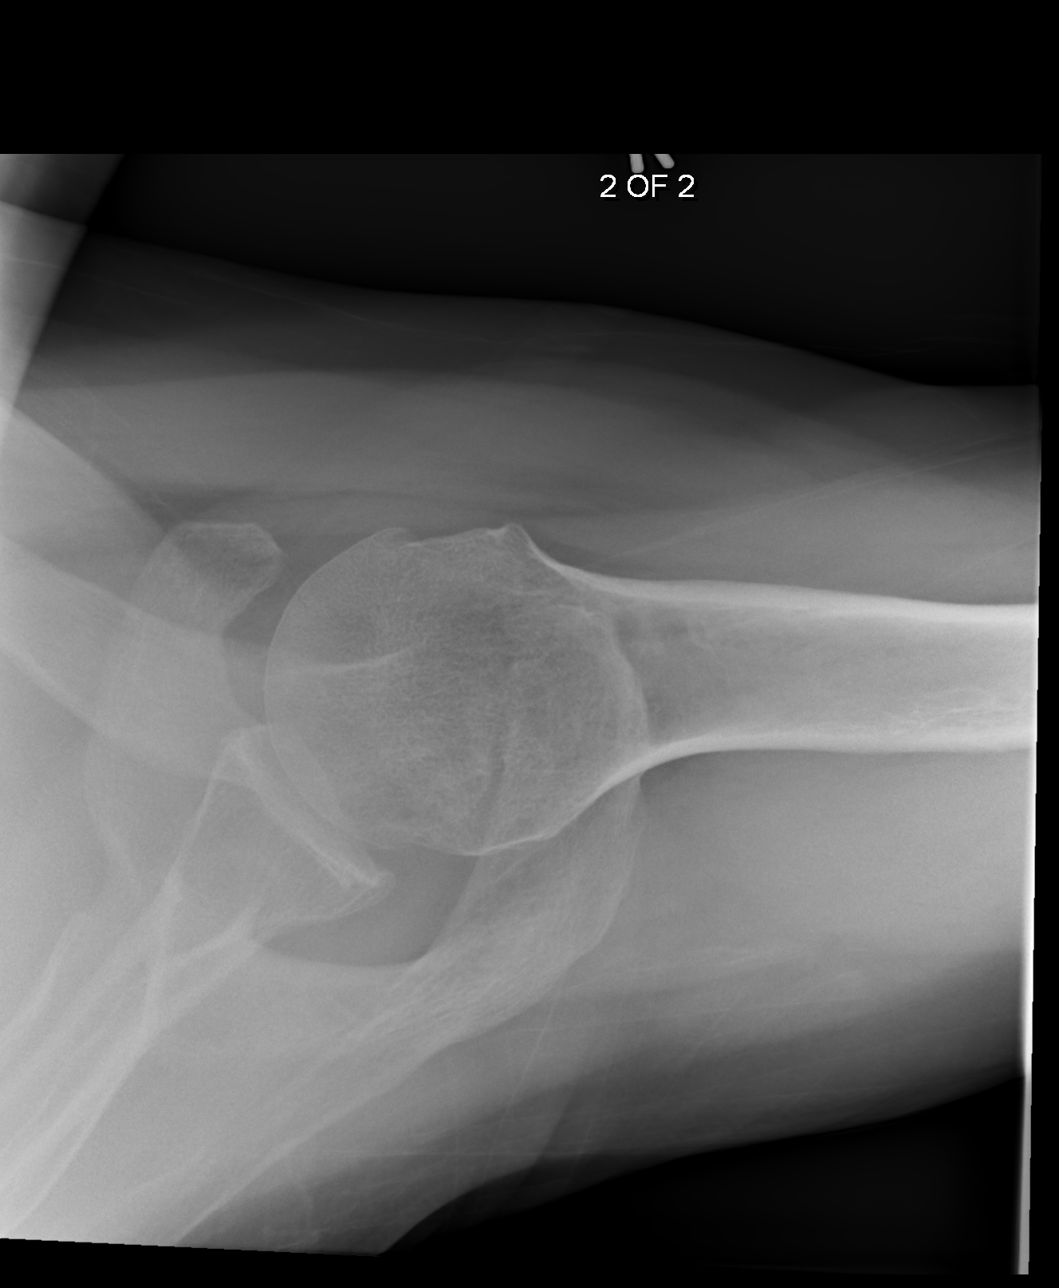

[4 of 4 positions shown; findings below may reference images not displayed]

FINDINGS: No glenohumeral joint dislocation. Bone mineralization is within
normal limits for age. Proximal right humerus appears intact. No
right clavicle or scapula fracture identified. Visualized right ribs
appear intact.
IMPRESSION: No acute fracture or dislocation identified about the right
shoulder.

## 2014-03-08 ENCOUNTER — Ambulatory Visit (INDEPENDENT_AMBULATORY_CARE_PROVIDER_SITE_OTHER): Payer: BC Managed Care – PPO | Admitting: Family Medicine

## 2014-03-08 ENCOUNTER — Encounter: Payer: Self-pay | Admitting: Family Medicine

## 2014-03-08 VITALS — BP 134/72 | HR 73 | Ht 72.0 in | Wt 227.0 lb

## 2014-03-08 DIAGNOSIS — M940 Chondrocostal junction syndrome [Tietze]: Secondary | ICD-10-CM

## 2014-03-08 MED ORDER — PREDNISONE 50 MG PO TABS: 50.0000 mg | ORAL_TABLET | Freq: Every day | ORAL | Status: DC

## 2014-03-08 NOTE — Patient Instructions (Addendum)
Good to see you  Prednisone daily for 5 days.  Ice 20 minutes at end of the day.  You are doing well.  I will check in on the bill.  Come back in 2 weeks if not completely healed or if you wants labs.  Costochondritis Costochondritis Costochondritis, sometimes called Tietze syndrome, is a swelling and irritation (inflammation) of the tissue (cartilage) that connects your ribs with your breastbone (sternum). It causes pain in the chest and rib area. Costochondritis usually goes away on its own over time. It can take up to 6 weeks or longer to get better, especially if you are unable to limit your activities. CAUSES  Some cases of costochondritis have no known cause. Possible causes include:  Injury (trauma).  Exercise or activity such as lifting.  Severe coughing. SIGNS AND SYMPTOMS  Pain and tenderness in the chest and rib area.  Pain that gets worse when coughing or taking deep breaths.  Pain that gets worse with specific movements. DIAGNOSIS  Your health care provider will do a physical exam and ask about your symptoms. Chest X-rays or other tests may be done to rule out other problems. TREATMENT  Costochondritis usually goes away on its own over time. Your health care provider may prescribe medicine to help relieve pain. HOME CARE INSTRUCTIONS   Avoid exhausting physical activity. Try not to strain your ribs during normal activity. This would include any activities using chest, abdominal, and side muscles, especially if heavy weights are used.  Apply ice to the affected area for the first 2 days after the pain begins.  Put ice in a plastic bag.  Place a towel between your skin and the bag.  Leave the ice on for 20 minutes, 2-3 times a day.  Only take over-the-counter or prescription medicines as directed by your health care provider. SEEK MEDICAL CARE IF:  You have redness or swelling at the rib joints. These are signs of infection.  Your pain does not go away despite  rest or medicine. SEEK IMMEDIATE MEDICAL CARE IF:   Your pain increases or you are very uncomfortable.  You have shortness of breath or difficulty breathing.  You cough up blood.  You have worse chest pains, sweating, or vomiting.  You have a fever or persistent symptoms for more than 2-3 days.  You have a fever and your symptoms suddenly get worse. MAKE SURE YOU:   Understand these instructions.  Will watch your condition.  Will get help right away if you are not doing well or get worse. Document Released: 04/23/2005 Document Revised: 05/04/2013 Document Reviewed: 02/15/2013 [facility] Patient Information 2015 [place], [place]. This information is not intended to replace advice given to you by your health care provider. Make sure you discuss any questions you have with your health care provider. Marland Kitchen

## 2014-03-08 NOTE — Progress Notes (Signed)
[facility] [address], [place] [address] Phone: [phone] Subjective:     CC: Right shoulder pain follow up  KGS:[id number] Jake Liu is a 53 y.o. male coming in with complaint of right shoulder pain. Patient states that actually the shoulder pain in the bicep tendon rupture he had previously seen to be completely healed. Patient though unfortunately last week he fell out of the shower. Patient states that he did hit onto the right side of his body and tried to catch himself. Patient did have severe pain in his right shoulder as well as the right arm again. Patient states though that that seemed to dissipate over the course of the next couple days. Patient states that that it seems to be pain free. Patient unfortunately continued to have more of a chest discomfort. Patient was seen in to the emergency department at an outside facility and was given Vicodin with no significant relief. Patient states everyday it seems to be getting better. Denies any association with running or increasing heart rate but states that any movement or even palpation can cause pain. Denies any shortness of breath but states that it is hard to take a deep breath.     Past medical history, social, surgical and family history all reviewed in electronic medical record.   Review of Systems: No headache, visual changes, nausea, vomiting, diarrhea, constipation, dizziness, abdominal pain, skin rash, fevers, chills, night sweats, weight loss, swollen lymph nodes, body aches, joint swelling, muscle aches, chest pain, shortness of breath, mood changes.   Objective Blood pressure 134/72, pulse 73, height 6' (1.829 m), weight 227 lb (102.967 kg).  General: No apparent distress alert and oriented x3 mood and affect normal, dressed appropriately.  HEENT: Pupils equal, extraocular movements intact  Respiratory: Patient's speak in full sentences and does not appear short of breath   Cardiovascular: No lower extremity edema, non tender, no erythema  Skin: Warm dry intact with no signs of infection or rash on extremities or on axial skeleton.  Abdomen: Soft nontender  Neuro: Cranial nerves II through XII are intact, neurovascularly intact in all extremities with 2+ DTRs and 2+ pulses.  Lymph: No lymphadenopathy of posterior or anterior cervical chain or axillae bilaterally.  Gait normal with good balance and coordination.  MSK:  Non tender with full range of motion and good stability and symmetric strength and tone of  elbows, wrist, hip, knee and ankles bilaterally.  Shoulder: Right Inspection reveals no abnormalities, atrophy or asymmetry. Palpation is normal with no tenderness over AC joint or bicipital groove. ROM is full in all planes passively. Rotator cuff strength normal throughout. signs of impingement with positive Neer and Hawkin's tests, but negative empty can sign. Significantly better than previous exam though. Speeds and Yergason's tests negative No labral pathology noted with negative Obrien's, negative clunk and good stability. Normal scapular function observed. No painful arc and no drop arm sign. No apprehension sign Contralateral shoulder unremarkable Chest wall on right side TTP on costosternal border of ribs 4-7. No crepitus noted.  Full strength of chest muscle.   Limited musculoskeletal ultrasound was performed and interpreted by me. Limited ultrasound showed patient did have appears to be a tear within the cartilage at the costochondral juncture with increasing Doppler flow at the fifth and sixth rib.  Impression: Costochondritis with the tear.    Impression and Recommendations:     This case required medical decision making of moderate complexity.

## 2014-03-08 NOTE — Assessment & Plan Note (Addendum)
Patient's chest wall pain seems to be secondary to a costochondritis. We discussed an icing regimen as well as prednisone for the next 5 days. Patient continues to have difficulty or pain he will come back again for further followup in the next 2 weeks. At that time I would consider more aggressive workup including labs to rule out any systemic pathology which is highly unlikely versus further evaluation by ultrasound and questionable MRI. An open patient responds well to to the prednisone.Hopefully.

## 2014-03-20 ENCOUNTER — Ambulatory Visit: Payer: Self-pay | Admitting: Unknown Physician Specialty

## 2014-03-20 LAB — HM COLONOSCOPY

## 2014-03-23 LAB — PATHOLOGY REPORT

## 2014-04-17 ENCOUNTER — Encounter: Payer: Self-pay | Admitting: *Deleted

## 2014-06-08 ENCOUNTER — Encounter: Payer: Self-pay | Admitting: Internal Medicine

## 2014-07-10 ENCOUNTER — Telehealth: Payer: Self-pay | Admitting: Internal Medicine

## 2014-07-10 NOTE — Telephone Encounter (Signed)
4pm Thursday for 28min unless you see another opening for 42min

## 2014-07-10 NOTE — Telephone Encounter (Signed)
Jake Liu came in concerned, saying he had an Upper "and lower" GI performed recently and a stomach infection was found. He was told everything was fine and to continue taking the medication prescribed by Dr. Gilford Rile but he wants to make sure it's ok. Also, he's had a scab on the top of his head that continues to go away and come back x's 53mos now. He'd like that evaluated. Please call the pt if he's able to get in sometime this week or next. He only wants to see Dr. Gilford Rile. Pt ph# [phone] Thank you.

## 2014-07-11 NOTE — Telephone Encounter (Signed)
The patient will be out of town . He informed me that Wednesday is his only day he would be able to come to an appointment.

## 2014-07-12 NOTE — Telephone Encounter (Signed)
Will need to figure out the next time he is available for evaluation.

## 2014-07-12 NOTE — Telephone Encounter (Signed)
Please see below.

## 2014-07-31 ENCOUNTER — Encounter: Payer: Self-pay | Admitting: Internal Medicine

## 2014-07-31 ENCOUNTER — Ambulatory Visit (INDEPENDENT_AMBULATORY_CARE_PROVIDER_SITE_OTHER): Payer: BC Managed Care – PPO | Admitting: Internal Medicine

## 2014-07-31 VITALS — BP 132/82 | HR 73 | Temp 97.6°F | Ht 72.0 in | Wt 238.0 lb

## 2014-07-31 DIAGNOSIS — I10 Essential (primary) hypertension: Secondary | ICD-10-CM

## 2014-07-31 DIAGNOSIS — K219 Gastro-esophageal reflux disease without esophagitis: Secondary | ICD-10-CM

## 2014-07-31 DIAGNOSIS — K2100 Gastro-esophageal reflux disease with esophagitis, without bleeding: Secondary | ICD-10-CM

## 2014-07-31 DIAGNOSIS — I4891 Unspecified atrial fibrillation: Secondary | ICD-10-CM

## 2014-07-31 DIAGNOSIS — E785 Hyperlipidemia, unspecified: Secondary | ICD-10-CM | POA: Insufficient documentation

## 2014-07-31 MED ORDER — PANTOPRAZOLE SODIUM 40 MG PO TBEC: 40.0000 mg | DELAYED_RELEASE_TABLET | Freq: Two times a day (BID) | ORAL | Status: DC

## 2014-07-31 MED ORDER — ATORVASTATIN CALCIUM 20 MG PO TABS: 20.0000 mg | ORAL_TABLET | Freq: Every day | ORAL | Status: DC

## 2014-07-31 NOTE — Progress Notes (Signed)
Subjective:    Patient ID: Jake Liu, male    DOB: 1961-05-13, 54 y.o.   MRN: [id number]  HPI 54YO male presents for acute visit.  Having some palpitations. Questions if he is having runs of AFIB. Would like to get back in with [name] at [facility]. Taking Metoprolol and Xarelto.  GERD - Off Pantoprazole since 02/2014. Has some epigastric pain. Occasional nausea. No vomiting. No change in BMs.    Wt Readings from Last 3 Encounters:  07/31/14 238 lb (107.956 kg)  03/08/14 227 lb (102.967 kg)  02/15/14 231 lb (104.781 kg)    Past medical, surgical, family and social history per today's encounter.  Review of Systems  Constitutional: Negative for fever, chills, activity change, appetite change, fatigue and unexpected weight change.  Eyes: Negative for visual disturbance.  Respiratory: Negative for cough and shortness of breath.   Cardiovascular: Positive for palpitations. Negative for chest pain and leg swelling.  Gastrointestinal: Positive for nausea and abdominal pain (epigastric). Negative for vomiting, diarrhea, constipation, blood in stool and abdominal distention.  Genitourinary: Negative for dysuria, urgency and difficulty urinating.  Musculoskeletal: Negative for arthralgias and gait problem.  Skin: Negative for color change and rash.  Hematological: Negative for adenopathy.  Psychiatric/Behavioral: Negative for sleep disturbance and dysphoric mood. The patient is not nervous/anxious.        Objective:    BP 132/82 mmHg  Pulse 73  Temp(Src) 97.6 F (36.4 C) (Oral)  Ht 6' (1.829 m)  Wt 238 lb (107.956 kg)  BMI 32.27 kg/m2  SpO2 95% Physical Exam  Constitutional: He is oriented to person, place, and time. He appears well-developed and well-nourished. No distress.  HENT:  Head: Normocephalic and atraumatic.  Right Ear: External ear normal.  Left Ear: External ear normal.  Nose: Nose normal.  Mouth/Throat: Oropharynx is clear and moist. No oropharyngeal  exudate.  Eyes: Conjunctivae and EOM are normal. Pupils are equal, round, and reactive to light. Right eye exhibits no discharge. Left eye exhibits no discharge. No scleral icterus.  Neck: Normal range of motion. Neck supple. No tracheal deviation present. No thyromegaly present.  Cardiovascular: Normal rate, regular rhythm and normal heart sounds.  Exam reveals no gallop and no friction rub.   No murmur heard. Pulmonary/Chest: Effort normal and breath sounds normal. No accessory muscle usage. No tachypnea. No respiratory distress. He has no decreased breath sounds. He has no wheezes. He has no rhonchi. He has no rales. He exhibits no tenderness.  Abdominal: Soft. Bowel sounds are normal. He exhibits no distension and no mass. There is tenderness (epigastric). There is no rebound and no guarding.  Musculoskeletal: Normal range of motion. He exhibits no edema.  Lymphadenopathy:    He has no cervical adenopathy.  Neurological: He is alert and oriented to person, place, and time. No cranial nerve deficit. Coordination normal.  Skin: Skin is warm and dry. No rash noted. He is not diaphoretic. No erythema. No pallor.  Psychiatric: He has a normal mood and affect. His behavior is normal. Judgment and thought content normal.          Assessment & Plan:   Problem List Items Addressed This Visit      Unprioritized   Atrial fibrillation status post cardioversion    Rate controlled with Metroprolol. Appears to be NSR today on exam. Anticoagulated with Wyline Copas. Will set up follow up with Dr. [name] at [facility].    Relevant Medications      atorvastatin (LIPITOR) tablet  Other Relevant Orders      Ambulatory referral to Cardiology   GERD (gastroesophageal reflux disease) - Primary    Worsening symptoms off Pantoprazole. Reviewed EGD which showed possible Barretts. Will restart Pantoprazole bid. Will request path on recent biopsy. He will follow up by email in 1 week. If no improvement, then will  need repeat EGD.     Relevant Medications      pantoprazole (PROTONIX) EC tablet   Hyperlipidemia    Start back on Atorvastatin. Repeat lipids and LFTs in 4 weeks.    Relevant Medications      atorvastatin (LIPITOR) tablet   Other Relevant Orders      Comprehensive metabolic panel      Lipid panel   Hypertension    BP Readings from Last 3 Encounters:  07/31/14 132/82  03/08/14 134/72  02/15/14 142/84   BP well controlled on Metoprolol.    Relevant Medications      atorvastatin (LIPITOR) tablet       Return in about 6 weeks (around 09/11/2014).

## 2014-07-31 NOTE — Assessment & Plan Note (Signed)
Start back on Atorvastatin. Repeat lipids and LFTs in 4 weeks.

## 2014-07-31 NOTE — Assessment & Plan Note (Signed)
Worsening symptoms off Pantoprazole. Reviewed EGD which showed possible Barretts. Will restart Pantoprazole bid. Will request path on recent biopsy. He will follow up by email in 1 week. If no improvement, then will need repeat EGD.

## 2014-07-31 NOTE — Patient Instructions (Signed)
Start back on Pantoprazole twice daily.  We will set up evaluation with Dr. [name].  Follow up labs in 4 weeks and visit in 6 weeks.

## 2014-07-31 NOTE — Assessment & Plan Note (Signed)
BP Readings from Last 3 Encounters:  07/31/14 132/82  03/08/14 134/72  02/15/14 142/84   BP well controlled on Metoprolol.

## 2014-07-31 NOTE — Progress Notes (Signed)
Pre visit review using our clinic review tool, if applicable. No additional management support is needed unless otherwise documented below in the visit note. 

## 2014-07-31 NOTE — Assessment & Plan Note (Signed)
Rate controlled with Metroprolol. Appears to be NSR today on exam. Anticoagulated with Wyline Copas. Will set up follow up with Dr. [name] at [facility].

## 2014-08-01 ENCOUNTER — Telehealth: Payer: Self-pay | Admitting: Internal Medicine

## 2014-08-01 NOTE — Telephone Encounter (Signed)
emmi emailed °

## 2014-11-29 ENCOUNTER — Telehealth: Payer: Self-pay | Admitting: *Deleted

## 2014-11-30 ENCOUNTER — Ambulatory Visit (INDEPENDENT_AMBULATORY_CARE_PROVIDER_SITE_OTHER)
Admission: RE | Admit: 2014-11-30 | Discharge: 2014-11-30 | Disposition: A | Discharge status: Home / Self Care | Payer: BLUE CROSS/BLUE SHIELD | Source: Ambulatory Visit | Attending: Nurse Practitioner | Admitting: Nurse Practitioner

## 2014-11-30 ENCOUNTER — Ambulatory Visit (INDEPENDENT_AMBULATORY_CARE_PROVIDER_SITE_OTHER): Payer: BLUE CROSS/BLUE SHIELD | Admitting: Nurse Practitioner

## 2014-11-30 ENCOUNTER — Encounter: Payer: Self-pay | Admitting: Nurse Practitioner

## 2014-11-30 DIAGNOSIS — R109 Unspecified abdominal pain: Secondary | ICD-10-CM

## 2014-11-30 DIAGNOSIS — R1030 Lower abdominal pain, unspecified: Secondary | ICD-10-CM

## 2014-11-30 LAB — POCT URINALYSIS DIPSTICK
Bilirubin, UA: NEGATIVE
Blood, UA: NEGATIVE
Glucose, UA: NEGATIVE
Ketones, UA: NEGATIVE
LEUKOCYTES UA: NEGATIVE
Nitrite, UA: NEGATIVE
PROTEIN UA: NEGATIVE
Spec Grav, UA: 1.01
Urobilinogen, UA: 0.2
pH, UA: 6.5

## 2014-11-30 IMAGING — CR DG ABDOMEN 1V
1 series · 1 of 1 positions shown · non-contrast
Comparison: None.

CLINICAL DATA: Ten days of bilateral flank pain and dysuria

EXAM:
ABDOMEN - 1 VIEW

[view not recorded]
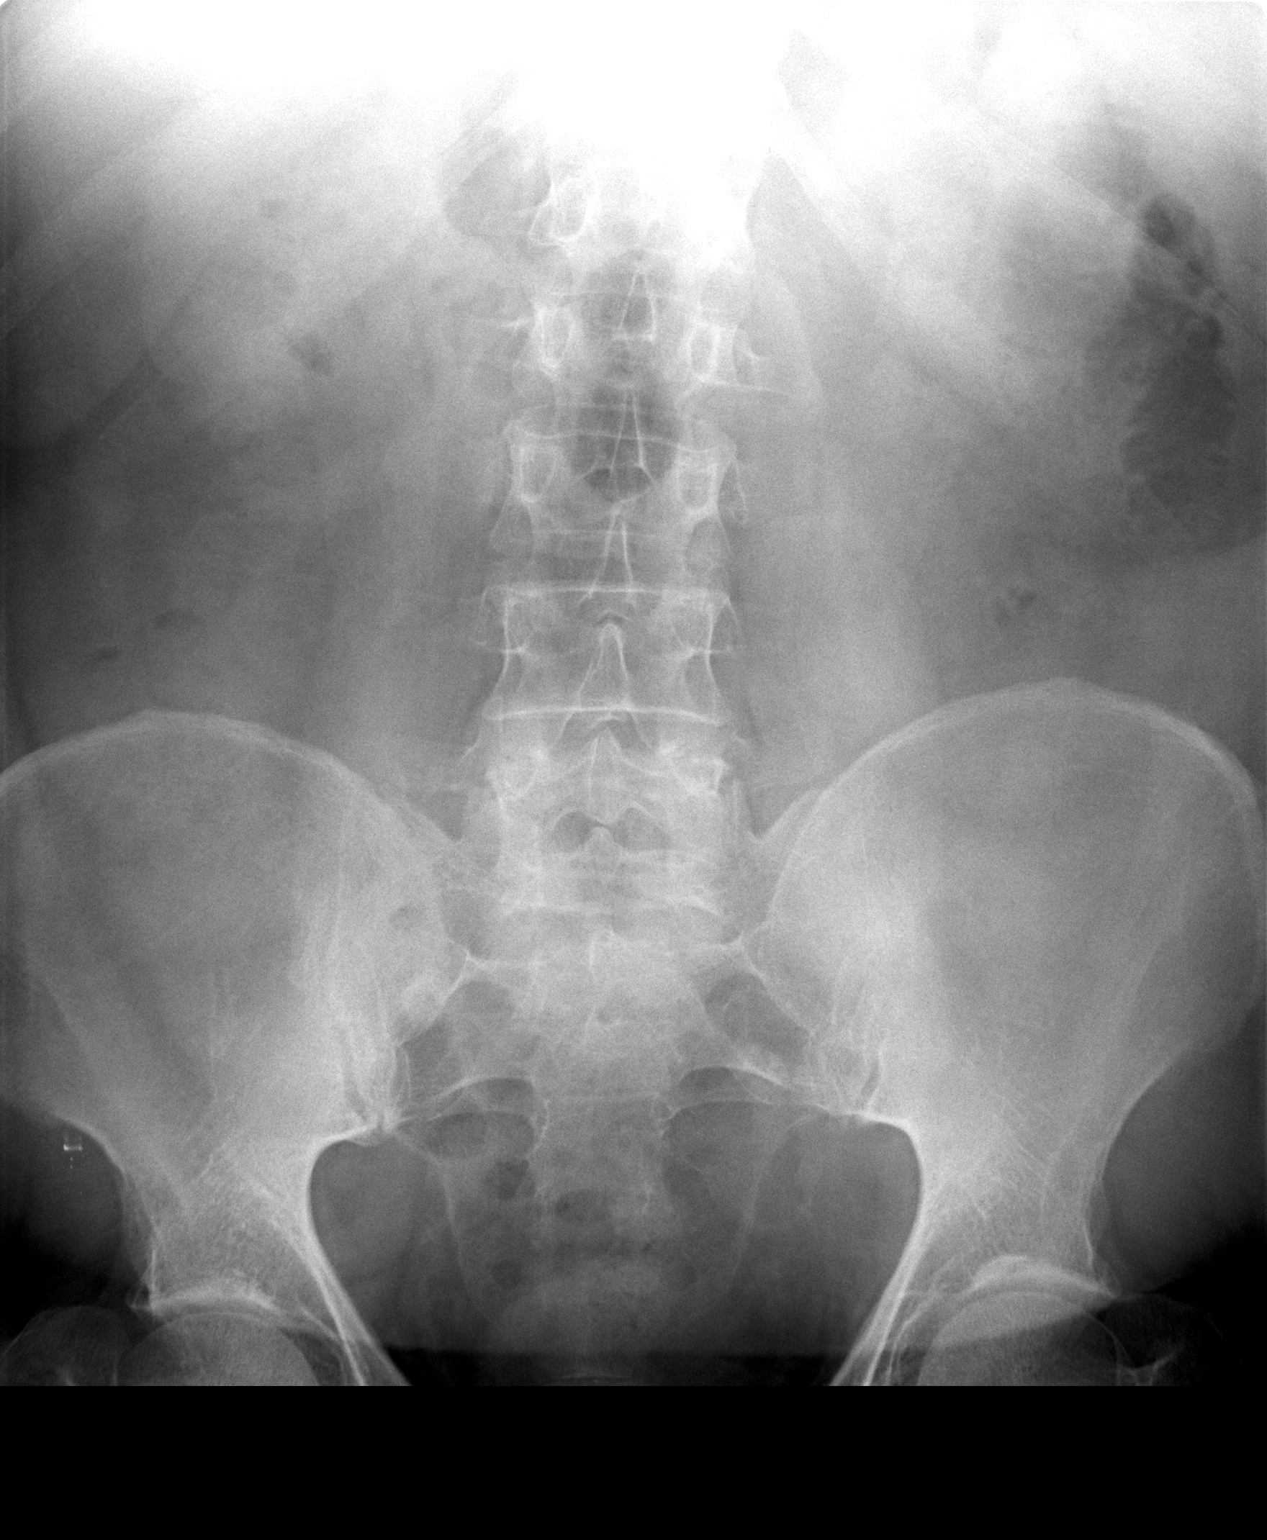

[1 of 1 positions shown; findings below may reference images not displayed]

FINDINGS: The bowel gas pattern is within the limits of normal. As best as can
be determined no abnormal calcifications project over the kidneys.
Along the course of the ureters and overlying the urinary bladder no
stones are evident. The observed portions of the lumbar spine and
pelvis are normal.
IMPRESSION: No calcified urinary tract stones are demonstrated. No acute
abdominal abnormality is evident on this study.

## 2014-11-30 NOTE — Patient Instructions (Addendum)
We will call you with results!   [place] at [facility] Practice Address:  Address: [address], [place], [place] [address]  [phone] X-rays until 4 pm.

## 2014-11-30 NOTE — Progress Notes (Signed)
Subjective:    Patient ID: Jake Liu, male    DOB: 15-Oct-1960, 54 y.o.   MRN: [id number]  HPI  [name] is a 54 yo male with a CC of "jagging pains", discuss HTN medications and talk about Protonix.   1) Jagging pains when urinating and sitting , flank pain, x 10 days Moved heavy furniture, but it happened after the pain started. Happens every day when getting up and down, pain lasts for hours Dull- for hours   HTN- Takes metoprolol and Xarelto, not on anything else. Usually BP comes down by end of visit.   No cardiac ablation yet  Review of Systems  Constitutional: Negative for fever, chills, diaphoresis and fatigue.  Eyes: Negative for visual disturbance.  Respiratory: Negative for chest tightness, shortness of breath and wheezing.   Cardiovascular: Negative for chest pain, palpitations and leg swelling.  Gastrointestinal: Positive for abdominal pain. Negative for nausea, vomiting and diarrhea.  Skin: Negative for rash.  Neurological: Negative for dizziness, weakness and numbness.  Psychiatric/Behavioral: The patient is not nervous/anxious.    Past Medical History  Diagnosis Date  . Sciatica   . Atrial fibrillation   . Cardiomyopathy     EF: 45%    History   Social History  . Marital Status: Single    Spouse Name: N/A  . Number of Children: N/A  . Years of Education: N/A   Occupational History  . Not on file.   Social History Main Topics  . Smoking status: Never Smoker   . Smokeless tobacco: Never Used  . Alcohol Use: 3.5 oz/week    7 drink(s) per week     Comment: drinks at night.  Drinks 10 cups coffee daily  . Drug Use: No  . Sexual Activity: Not on file   Other Topics Concern  . Not on file   Social History Narrative    Past Surgical History  Procedure Laterality Date  . Knee surgery      Family History  Problem Relation Age of Onset  . Heart disease Mother 66  . Asthma Father     Allergies  Allergen Reactions  . Bactrim  [Sulfamethoxazole-Trimethoprim]     Sulfa/Rash  . Lisinopril     cough    Current Outpatient Prescriptions on File Prior to Visit  Medication Sig Dispense Refill  . atorvastatin (LIPITOR) 20 MG tablet Take 1 tablet (20 mg total) by mouth daily. 30 tablet 2  . metoprolol succinate (TOPROL-XL) 50 MG 24 hr tablet Take 1 tablet (50 mg total) by mouth daily. Take with or immediately following a meal. 30 tablet 3  . rivaroxaban (XARELTO) 20 MG TABS tablet Take by mouth.     No current facility-administered medications on file prior to visit.       Objective:   Physical Exam  Constitutional: He is oriented to person, place, and time. He appears well-developed and well-nourished. No distress.  BP 150/98 mmHg  Pulse 72  Temp(Src) 98.1 F (36.7 C) (Oral)  Resp 12  Ht 6' (1.829 m)  Wt 231 lb (104.781 kg)  BMI 31.32 kg/m2  SpO2 94%  Redo at end 130/80   HENT:  Head: Normocephalic and atraumatic.  Right Ear: External ear normal.  Left Ear: External ear normal.  Cardiovascular: Normal rate, regular rhythm, normal heart sounds and intact distal pulses.  Exam reveals no gallop and no friction rub.   No murmur heard. Pulmonary/Chest: Effort normal and breath sounds normal. No respiratory distress. He has  no wheezes. He has no rales. He exhibits no tenderness.  Abdominal: Soft. Bowel sounds are normal. He exhibits no distension and no mass. There is tenderness. There is no rebound and no guarding.  Tenderness bilateral lower quadrants and into inguinal areas  Neurological: He is alert and oriented to person, place, and time.  Skin: Skin is warm and dry. No rash noted. He is not diaphoretic.  Psychiatric: He has a normal mood and affect. His behavior is normal. Judgment and thought content normal.       Assessment & Plan:

## 2014-11-30 NOTE — Telephone Encounter (Signed)
Pt was put on Carrie's schedule for 11/30/14

## 2014-11-30 NOTE — Progress Notes (Signed)
Pre visit review using our clinic review tool, if applicable. No additional management support is needed unless otherwise documented below in the visit note. 

## 2014-12-09 NOTE — Assessment & Plan Note (Addendum)
No true flank pain, lower abdominal pain is more accurate. Will obtain Abd x-ray and POCT urine.

## 2015-01-03 ENCOUNTER — Telehealth: Payer: Self-pay | Admitting: Internal Medicine

## 2015-01-03 NOTE — Telephone Encounter (Signed)
Pt advise that he needs to go to the ER to be evaluated and that he may need a testicular U/S. Pt stated that he is not going to the ER because he has two conference calls today and he will be out of town with work tomorrow and doesn't have time to set in the ER. Pt scheduled for an appt with Doss-NP on 01/05/15 at 8am.msn

## 2015-01-03 NOTE — Telephone Encounter (Signed)
Agree with plan for pt to go to ED for evaluation of testicular pain and torsion. Refusal to comply with plan could result in worsening symptoms, damage to the testicle.

## 2015-01-05 ENCOUNTER — Ambulatory Visit (INDEPENDENT_AMBULATORY_CARE_PROVIDER_SITE_OTHER): Payer: BLUE CROSS/BLUE SHIELD | Admitting: Nurse Practitioner

## 2015-01-05 ENCOUNTER — Encounter: Payer: Self-pay | Admitting: Nurse Practitioner

## 2015-01-05 VITALS — BP 120/62 | HR 78 | Temp 97.9°F | Resp 18 | Ht 72.0 in | Wt 236.8 lb

## 2015-01-05 DIAGNOSIS — R102 Pelvic and perineal pain: Secondary | ICD-10-CM | POA: Diagnosis not present

## 2015-01-05 DIAGNOSIS — Z113 Encounter for screening for infections with a predominantly sexual mode of transmission: Secondary | ICD-10-CM

## 2015-01-05 DIAGNOSIS — Z13220 Encounter for screening for lipoid disorders: Secondary | ICD-10-CM | POA: Diagnosis not present

## 2015-01-05 DIAGNOSIS — Z125 Encounter for screening for malignant neoplasm of prostate: Secondary | ICD-10-CM | POA: Diagnosis not present

## 2015-01-05 DIAGNOSIS — R1030 Lower abdominal pain, unspecified: Secondary | ICD-10-CM

## 2015-01-05 DIAGNOSIS — Z130 Encounter for screening for diseases of the blood and blood-forming organs and certain disorders involving the immune mechanism: Secondary | ICD-10-CM | POA: Diagnosis not present

## 2015-01-05 LAB — CBC WITH DIFFERENTIAL/PLATELET
BASOS ABS: 0 10*3/uL (ref 0.0–0.1)
BASOS PCT: 0.7 % (ref 0.0–3.0)
EOS PCT: 4.9 % (ref 0.0–5.0)
Eosinophils Absolute: 0.3 10*3/uL (ref 0.0–0.7)
HCT: 45 % (ref 39.0–52.0)
HEMOGLOBIN: 15.1 g/dL (ref 13.0–17.0)
LYMPHS ABS: 1.8 10*3/uL (ref 0.7–4.0)
Lymphocytes Relative: 31.4 % (ref 12.0–46.0)
MCHC: 33.5 g/dL (ref 30.0–36.0)
MCV: 90.6 fl (ref 78.0–100.0)
MONOS PCT: 8.5 % (ref 3.0–12.0)
Monocytes Absolute: 0.5 10*3/uL (ref 0.1–1.0)
NEUTROS ABS: 3.1 10*3/uL (ref 1.4–7.7)
Neutrophils Relative %: 54.5 % (ref 43.0–77.0)
Platelets: 257 10*3/uL (ref 150.0–400.0)
RBC: 4.97 Mil/uL (ref 4.22–5.81)
RDW: 13.2 % (ref 11.5–15.5)
WBC: 5.8 10*3/uL (ref 4.0–10.5)

## 2015-01-05 LAB — COMPREHENSIVE METABOLIC PANEL
ALBUMIN: 4.3 g/dL (ref 3.5–5.2)
ALT: 68 U/L — AB (ref 0–53)
AST: 63 U/L — ABNORMAL HIGH (ref 0–37)
Alkaline Phosphatase: 72 U/L (ref 39–117)
BILIRUBIN TOTAL: 0.3 mg/dL (ref 0.2–1.2)
BUN: 16 mg/dL (ref 6–23)
CHLORIDE: 103 meq/L (ref 96–112)
CO2: 30 mEq/L (ref 19–32)
CREATININE: 0.68 mg/dL (ref 0.40–1.50)
Calcium: 9.7 mg/dL (ref 8.4–10.5)
GFR: 128.79 mL/min (ref >60.00)
Glucose, Bld: 102 mg/dL — ABNORMAL HIGH (ref 70–99)
POTASSIUM: 4.9 meq/L (ref 3.5–5.1)
Sodium: 137 mEq/L (ref 135–145)
TOTAL PROTEIN: 7.1 g/dL (ref 6.0–8.3)

## 2015-01-05 LAB — LIPID PANEL
CHOLESTEROL: 192 mg/dL (ref 0–200)
HDL: 51 mg/dL (ref >39.00)
LDL Cholesterol: 125 mg/dL — ABNORMAL HIGH (ref 0–99)
NONHDL: 141
TRIGLYCERIDES: 82 mg/dL (ref 0.0–149.0)
Total CHOL/HDL Ratio: 4
VLDL: 16.4 mg/dL (ref 0.0–40.0)

## 2015-01-05 LAB — PSA: PSA: 0.26 ng/mL (ref 0.10–4.00)

## 2015-01-05 NOTE — Progress Notes (Signed)
Pre visit review using our clinic review tool, if applicable. No additional management support is needed unless otherwise documented below in the visit note. 

## 2015-01-05 NOTE — Patient Instructions (Signed)
Please visit the lab today before leaving.   We will contact you about your referral to urology for further work up.

## 2015-01-05 NOTE — Assessment & Plan Note (Signed)
Stable, but not improving. Second visit. Will try STD panel, PSA, and GC/chlamydia urine probe, and refer to urology. Concerned for occult hernia.

## 2015-01-05 NOTE — Progress Notes (Signed)
   Subjective:    Patient ID: Jake Liu, male    DOB: 10-22-1960, 54 y.o.   MRN: [id number]  HPI  Jake Liu is a 54 yo male with a CC of pelvic pain x 2.5 months.   1) Pain in pelvic area radiating to hips, urination helps the ache, dull ache all of the time. He reports he is sexually active and has been with same partner for 2 years. Denies any other sexual activity on his end. Denies feeling bulges in groin or lumps in testicles.  Review of Systems  Constitutional: Negative for fever, chills, diaphoresis and fatigue.  Eyes: Negative for visual disturbance.  Respiratory: Negative for chest tightness, shortness of breath and wheezing.   Cardiovascular: Negative for chest pain, palpitations and leg swelling.  Gastrointestinal: Positive for abdominal pain. Negative for nausea, vomiting and diarrhea.  Genitourinary: Positive for testicular pain. Negative for frequency, hematuria, penile swelling, scrotal swelling, genital sores and penile pain.  Musculoskeletal: Negative for back pain and neck pain.  Skin: Negative for rash.  Neurological: Negative for dizziness, weakness and numbness.      Objective:   Physical Exam  Constitutional: He is oriented to person, place, and time. He appears well-developed and well-nourished. No distress.  BP 120/62 mmHg  Pulse 78  Temp(Src) 97.9 F (36.6 C)  Resp 18  Ht 6' (1.829 m)  Wt 236 lb 12.8 oz (107.412 kg)  BMI 32.11 kg/m2  SpO2 96%   HENT:  Head: Normocephalic and atraumatic.  Right Ear: External ear normal.  Left Ear: External ear normal.  Cardiovascular: Normal rate, regular rhythm, normal heart sounds and intact distal pulses.  Exam reveals no gallop and no friction rub.   No murmur heard. Pulmonary/Chest: Effort normal and breath sounds normal. No respiratory distress. He has no wheezes. He has no rales. He exhibits no tenderness.  Abdominal: Hernia confirmed negative in the right inguinal area and confirmed negative in the left  inguinal area.  Genitourinary: Penis normal. Cremasteric reflex is present. Right testis shows tenderness. Right testis shows no mass and no swelling. Right testis is descended. Left testis shows no mass, no swelling and no tenderness. Left testis is descended. Circumcised. No phimosis, paraphimosis, hypospadias, penile erythema or penile tenderness. No discharge found.  Tenderness present and mild on the back side of the testicles near the perineum  Lymphadenopathy:       Right: No inguinal adenopathy present.       Left: No inguinal adenopathy present.  Neurological: He is alert and oriented to person, place, and time.  Skin: Skin is warm and dry. No rash noted. He is not diaphoretic.  Psychiatric: He has a normal mood and affect. His behavior is normal. Judgment and thought content normal.      Assessment & Plan:

## 2015-01-06 LAB — GC/CHLAMYDIA PROBE AMP, URINE
Chlamydia, Swab/Urine, PCR: NEGATIVE
GC Probe Amp, Urine: NEGATIVE

## 2015-01-06 LAB — STD PANEL
HIV: NONREACTIVE
Hepatitis B Surface Ag: NEGATIVE

## 2015-01-25 ENCOUNTER — Ambulatory Visit: Payer: Self-pay

## 2015-03-28 IMAGING — US US EXTREM LOW VENOUS*L*
1 series · 14 of 24 positions shown · non-contrast
Comparison: None

CLINICAL DATA: Leg swelling and pain x2 weeks post fall. On Xarelto

EXAM:
LEFT LOWER EXTREMITY VENOUS DOPPLER ULTRASOUND
TECHNIQUE: Gray-scale sonography with compression, as well as color and duplex
ultrasound, were performed to evaluate the deep venous system from
the level of the common femoral vein through the popliteal and
proximal calf veins.

[Series 1: us extrem low venous*left* · 0.08mm/px · 14 of 34 slices shown]
[im 1/34]
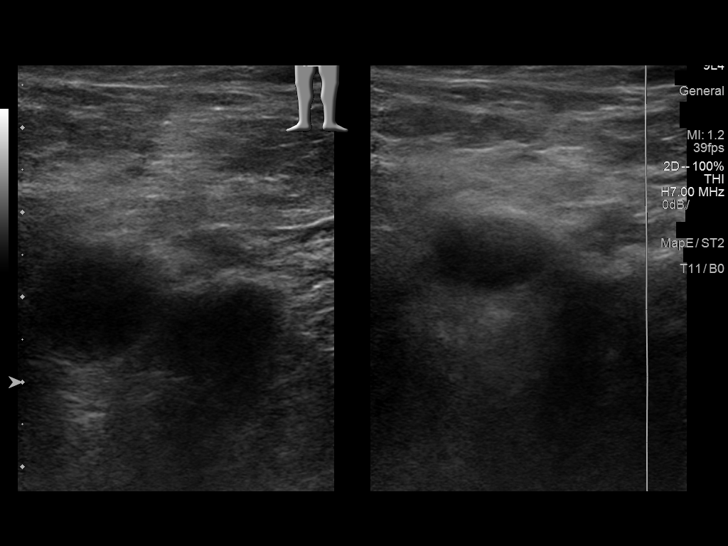
[im 3/34]
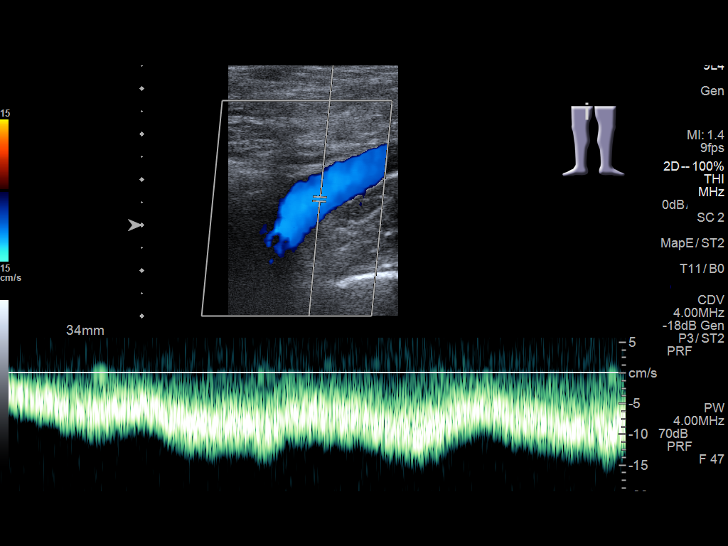
[im 6/34]
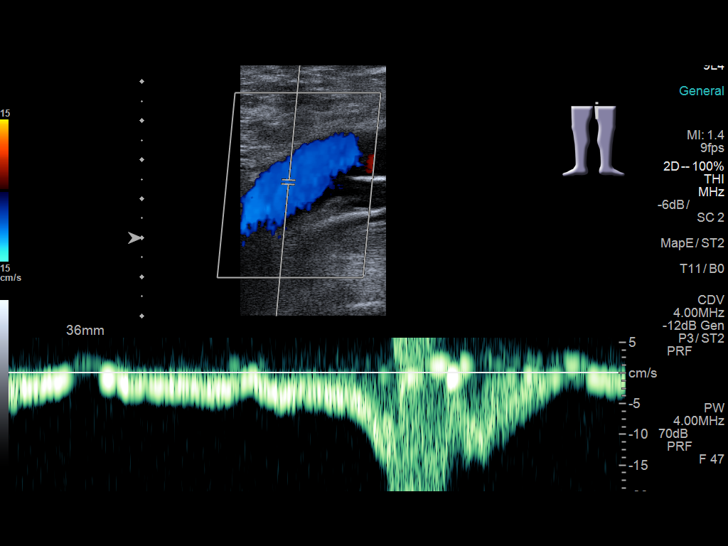
[im 9/34]
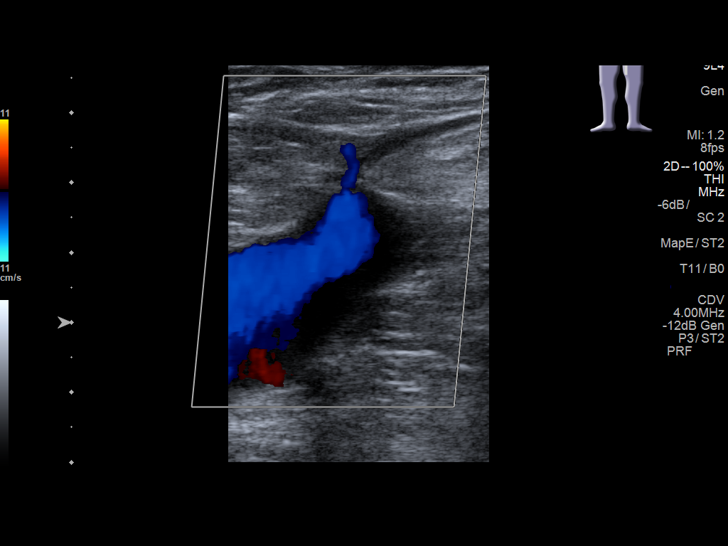
[im 11/34]
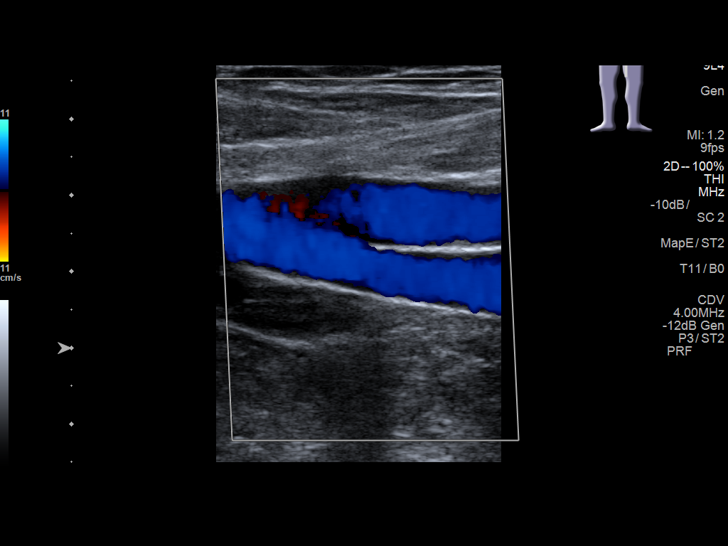
[im 13/34]
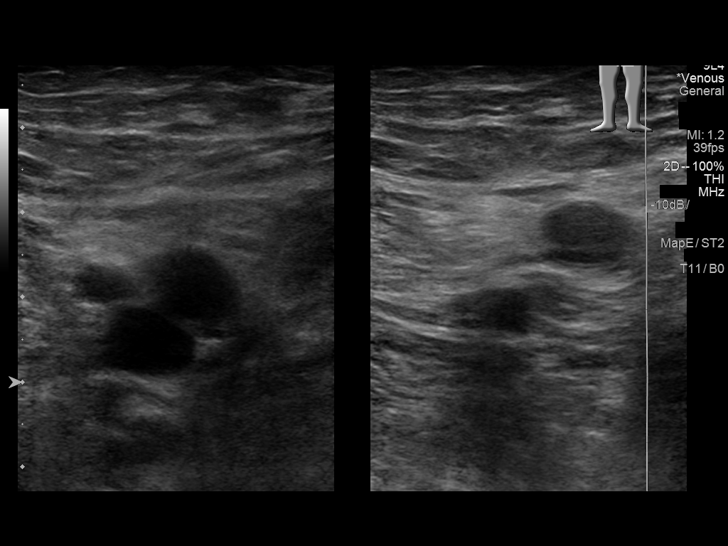
[im 16/34]
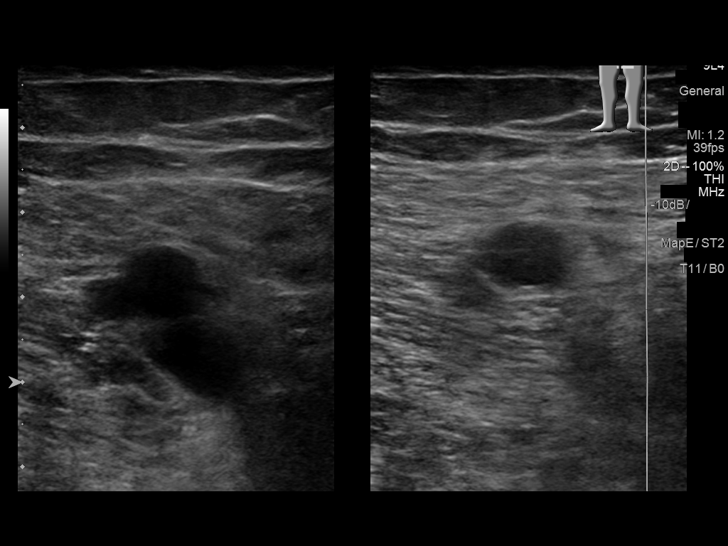
[im 18/34]
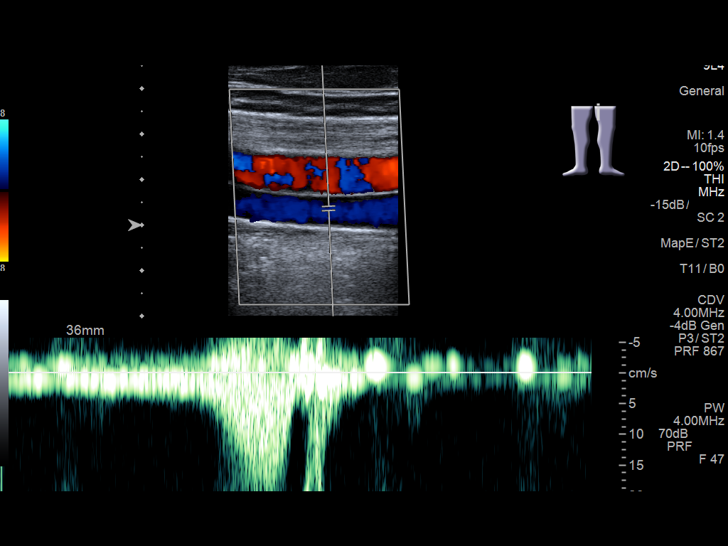
[im 21/34]
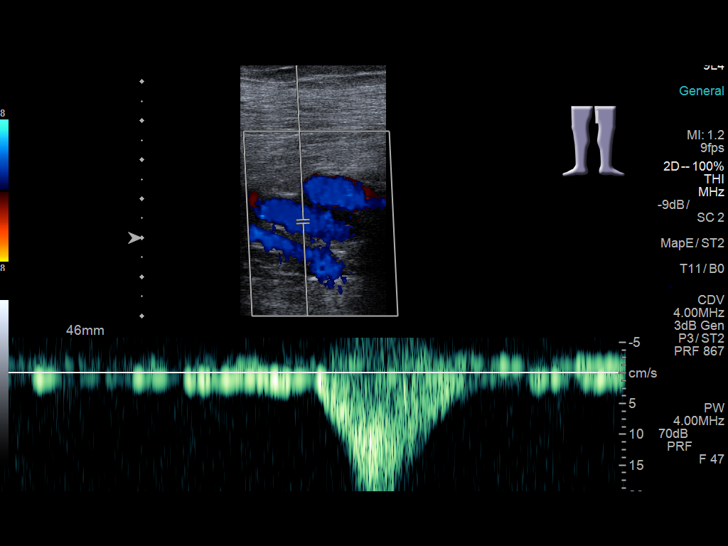
[im 23/34]
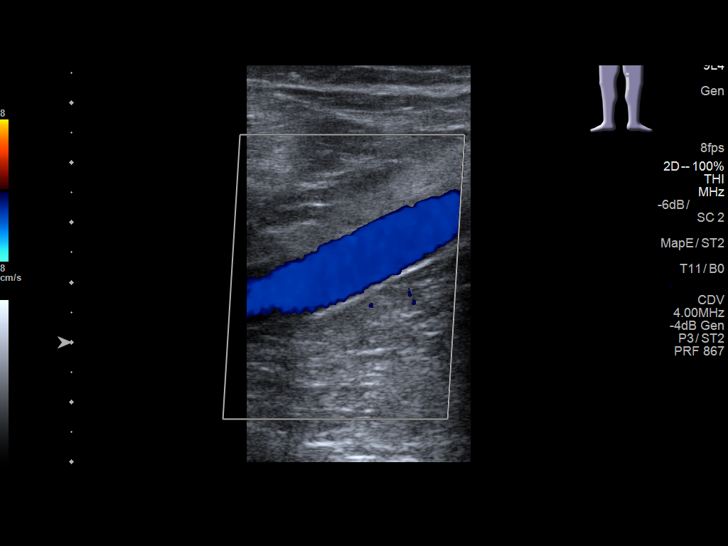
[im 26/34]
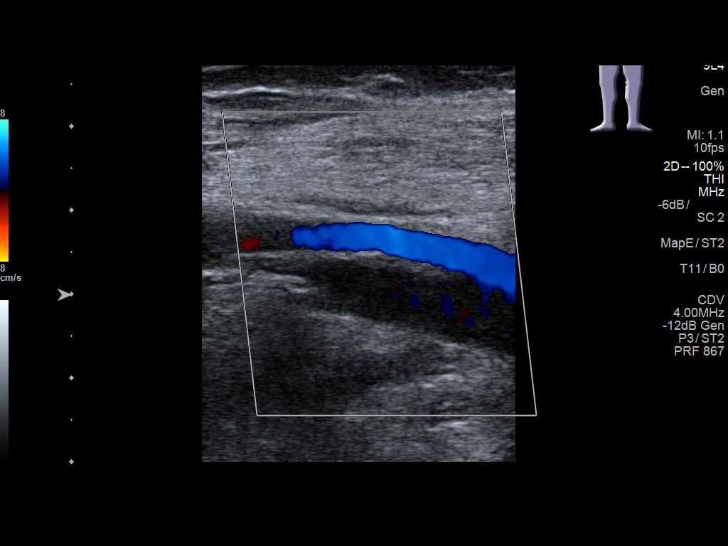
[im 28/34]
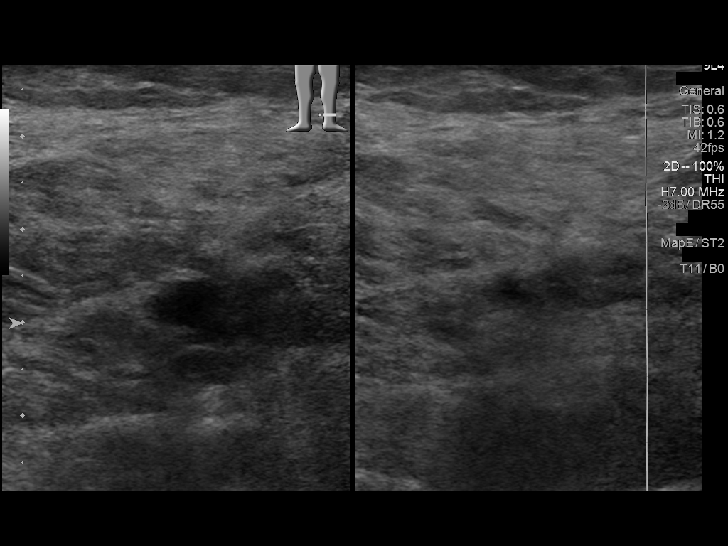
[im 31/34]
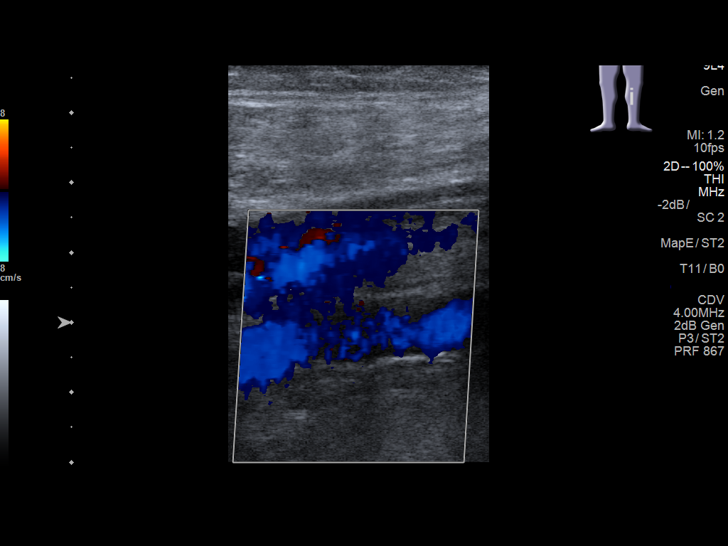
[im 34/34]
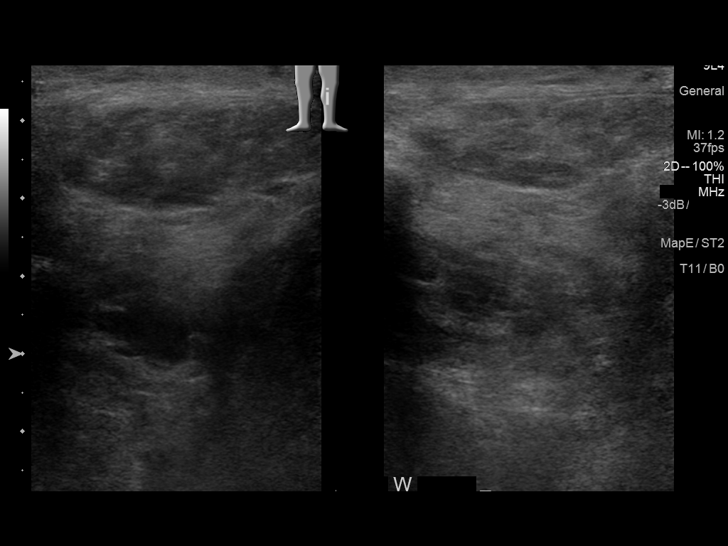

[14 of 24 positions shown; findings below may reference images not displayed]

FINDINGS: Normal compressibility of the common femoral, superficial femoral,
and popliteal veins, as well as the proximal calf veins. No filling
defects to suggest DVT on grayscale or color Doppler imaging.
Doppler waveforms show normal direction of venous flow, normal
respiratory phasicity and response to augmentation. Survey views of
the contralateral common femoral vein are unremarkable.
IMPRESSION: No evidence of  lower extremity deep vein thrombosis, left.

## 2015-04-13 ENCOUNTER — Encounter: Payer: Self-pay | Admitting: Family Medicine

## 2015-04-13 ENCOUNTER — Ambulatory Visit (INDEPENDENT_AMBULATORY_CARE_PROVIDER_SITE_OTHER): Payer: BLUE CROSS/BLUE SHIELD | Admitting: Family Medicine

## 2015-04-13 VITALS — BP 130/86 | HR 62 | Temp 97.6°F | Ht 72.0 in | Wt 230.6 lb

## 2015-04-13 DIAGNOSIS — J0100 Acute maxillary sinusitis, unspecified: Secondary | ICD-10-CM

## 2015-04-13 DIAGNOSIS — J019 Acute sinusitis, unspecified: Secondary | ICD-10-CM | POA: Insufficient documentation

## 2015-04-13 MED ORDER — AMOXICILLIN-POT CLAVULANATE 875-125 MG PO TABS: 1.0000 | ORAL_TABLET | Freq: Two times a day (BID) | ORAL | Status: DC

## 2015-04-13 NOTE — Assessment & Plan Note (Signed)
Symptoms and duration of symptoms consistent with bacterial sinusitis. Well appearing and vitals stable. Will treat with augmentin. Discussed antihistamines and nasal steroids can be taken OTC. Given return precautions.

## 2015-04-13 NOTE — Patient Instructions (Signed)
Nice to meet you. You have a sinus infection.  We will treat this with augmentin, an antibiotic. You could use over the counter zyrtec 10 mg daily or over the counter flonase 2 sprays in each nostril daily.  If you develop fever, trouble breathing, chest pain, worsening symptoms, or blood in your sputum please seek medical attention.

## 2015-04-13 NOTE — Progress Notes (Signed)
Pre visit review using our clinic review tool, if applicable. No additional management support is needed unless otherwise documented below in the visit note. 

## 2015-04-13 NOTE — Progress Notes (Signed)
Patient ID: Jake Liu, male   DOB: 26-Dec-1960, 54 y.o.   MRN: [id number]  Tommi Rumps, MD Phone: [phone]  Jake Liu is a 54 y.o. male who presents today for same day appointment.  Sinus congestion: new issue. 10 days of sinus congestion and pressure. Notes it is getting worse. Notes green mucus from nose and throat. Minimal cough. No shortness of breath or fever. Has post nasal drip. Has taken benadryl with minimal help.   PMH: nonsmoker.   ROS  Objective  Physical Exam Filed Vitals:   04/13/15 0817  BP: 130/86  Pulse: 62  Temp: 97.6 F (36.4 C)   Physical Exam  Constitutional: He is well-developed, well-nourished, and in no distress.  HENT:  Head: Normocephalic and atraumatic.  Right Ear: External ear normal.  Left Ear: External ear normal.  Mouth/Throat: Oropharynx is clear and moist. No oropharyngeal exudate.  Normal TM bilaterally  Eyes: Conjunctivae are normal. Pupils are equal, round, and reactive to light.  Neck: Neck supple.  Cardiovascular: Normal rate, regular rhythm and normal heart sounds.  Exam reveals no gallop and no friction rub.   No murmur heard. Pulmonary/Chest: Effort normal and breath sounds normal. No respiratory distress. He has no wheezes. He has no rales.  Lymphadenopathy:    He has no cervical adenopathy.  Neurological: He is alert. Gait normal.  Skin: Skin is warm and dry. He is not diaphoretic.     Assessment/Plan: Please see individual problem list.  Acute sinusitis Symptoms and duration of symptoms consistent with bacterial sinusitis. Well appearing and vitals stable. Will treat with augmentin. Discussed antihistamines and nasal steroids can be taken OTC. Given return precautions.     Meds ordered this encounter  Medications  . amoxicillin-clavulanate (AUGMENTIN) 875-125 MG per tablet    Sig: Take 1 tablet by mouth 2 (two) times daily.    Dispense:  14 tablet    Refill:  0   Tommi Rumps

## 2015-07-06 ENCOUNTER — Encounter: Payer: Self-pay | Admitting: Family Medicine

## 2015-07-06 ENCOUNTER — Encounter: Payer: Self-pay | Admitting: Internal Medicine

## 2015-07-06 ENCOUNTER — Ambulatory Visit (INDEPENDENT_AMBULATORY_CARE_PROVIDER_SITE_OTHER): Payer: BLUE CROSS/BLUE SHIELD | Admitting: Family Medicine

## 2015-07-06 VITALS — BP 136/78 | HR 96 | Temp 98.4°F | Ht 72.0 in | Wt 231.8 lb

## 2015-07-06 DIAGNOSIS — R300 Dysuria: Secondary | ICD-10-CM

## 2015-07-06 DIAGNOSIS — G8929 Other chronic pain: Secondary | ICD-10-CM

## 2015-07-06 DIAGNOSIS — K429 Umbilical hernia without obstruction or gangrene: Secondary | ICD-10-CM | POA: Diagnosis not present

## 2015-07-06 DIAGNOSIS — N50819 Testicular pain, unspecified: Secondary | ICD-10-CM

## 2015-07-06 DIAGNOSIS — J209 Acute bronchitis, unspecified: Secondary | ICD-10-CM

## 2015-07-06 DIAGNOSIS — R1031 Right lower quadrant pain: Secondary | ICD-10-CM

## 2015-07-06 DIAGNOSIS — R1032 Left lower quadrant pain: Secondary | ICD-10-CM

## 2015-07-06 LAB — POCT URINALYSIS DIPSTICK
Bilirubin, UA: NEGATIVE
Glucose, UA: NEGATIVE
Ketones, UA: NEGATIVE
LEUKOCYTES UA: NEGATIVE
NITRITE UA: NEGATIVE
PH UA: 6.5
PROTEIN UA: NEGATIVE
RBC UA: NEGATIVE
Spec Grav, UA: 1.02
UROBILINOGEN UA: 0.2

## 2015-07-06 MED ORDER — PREDNISONE 50 MG PO TABS: ORAL_TABLET | ORAL | Status: DC

## 2015-07-06 MED ORDER — AZITHROMYCIN 250 MG PO TABS: ORAL_TABLET | ORAL | Status: DC

## 2015-07-06 NOTE — Assessment & Plan Note (Signed)
Unclear etiology. Negative urinalysis today. Sending to urology for further evaluation (especially in the setting of his other complaints).

## 2015-07-06 NOTE — Progress Notes (Signed)
Pre visit review using our clinic review tool, if applicable. No additional management support is needed unless otherwise documented below in the visit note. 

## 2015-07-06 NOTE — Assessment & Plan Note (Signed)
New problem. Treating with Prednisone and Azithromycin.

## 2015-07-06 NOTE — Patient Instructions (Addendum)
Take the antibiotic and the prednisone as prescribed.   We will be in touch regarding your referrals.  Follow up closely with Dr. Gilford Rile  Take care  Dr. Lacinda Axon

## 2015-07-06 NOTE — Assessment & Plan Note (Signed)
New problem. Found incidentally on exam today. Discussed with patient today and he would like to see a general surgeon for elective repair. Referral to general surgery placed.

## 2015-07-06 NOTE — Progress Notes (Signed)
Subjective:  Patient ID: Jake Liu, male    DOB: [birth year]  Age: 54 y.o. MRN: [id number]  CC: Cough, Pain w/ urination, Lower abdominal pain  HPI:  54 year old male presents with the above complaints.  1) Cough  Patient reports he has had cough for the past 2 weeks.  He states it is been worsening over the past 3 days.  Cough is mildly productive of discolored sputum.  No associated fevers or chills.  He's been using like well with no relief.  No known exacerbating factors.  No associated shortness of breath.  2) Dysuria, Lower abdominal pain  He has been seen for this previously. I reviewed these encounters. He had a negative laboratory workup including STD testing. He was referred to urology but later The appointment as he was feeling better.  Patient has had recurrent symptoms for the past 3-4 weeks.  He states that he has pain with urination quite often.  He denies any hematuria. He does report some intermittent flank pain.  Additionally, he reports bilateral lower abdominal pain that is intermittent.  He states that his pain is moderate to severe.  No associated fevers or chills. He does note that he's had some diarrhea recently.  Also, patient states that he had urological surgery as a child and has testicular tenderness as well.  No known exacerbating or relieving factors.  Social Hx   Social History   Social History  . Marital Status: Single    Spouse Name: N/A  . Number of Children: N/A  . Years of Education: N/A   Social History Main Topics  . Smoking status: Never Smoker   . Smokeless tobacco: Never Used  . Alcohol Use: 3.5 oz/week    7 drink(s) per week     Comment: drinks at night.  Drinks 10 cups coffee daily  . Drug Use: No  . Sexual Activity: Not Asked   Other Topics Concern  . None   Social History Narrative   Review of Systems  Constitutional: Negative for fever and chills.  Gastrointestinal: Positive for abdominal pain.    Genitourinary: Positive for dysuria and testicular pain. Negative for urgency, frequency and hematuria.   Objective:  BP 136/78 mmHg  Pulse 96  Temp(Src) 98.4 F (36.9 C) (Oral)  Ht 6' (1.829 m)  Wt 231 lb 12 oz (105.121 kg)  BMI 31.42 kg/m2  SpO2 96%  BP/Weight 07/06/2015 04/13/2015 [id number]  Systolic BP [id number]  Diastolic BP 78 86 62  Wt. (Lbs) 231.75 230.6 236.8  BMI 31.42 31.27 32.11   Physical Exam  Constitutional: He appears well-developed. No distress.  HENT:  Head: Normocephalic and atraumatic.  Right Ear: External ear normal.  Left Ear: External ear normal.  Mouth/Throat: Oropharynx is clear and moist. No oropharyngeal exudate.  Eyes: Conjunctivae are normal.  Neck: Neck supple.  Cardiovascular:  Irregularly Irregular.  Pulmonary/Chest: Effort normal and breath sounds normal. No respiratory distress. He has no wheezes. He has no rales.  Abdominal: Soft.  Tenderness in the RLQ/inguinal region. Umbilical hernia noted. Reducible. No rebound or guarding. No palpable mass.  Genitourinary:  Testicles exquisitely tender to palpation. I cannot appreciate a hernia although the exam was limited due to testicular tenderness. Normal penis.  Lymphadenopathy:    He has no cervical adenopathy.  Neurological: He is alert.  Vitals reviewed.  Results for orders placed or performed in visit on 07/06/15 (from the past 24 hour(s))  POCT Urinalysis Dipstick     Status:  Normal   Collection Time: 07/06/15  8:46 AM  Result Value Ref Range   Color, UA yellow    Clarity, UA clear    Glucose, UA neg    Bilirubin, UA neg    Ketones, UA neg    Spec Grav, UA 1.020    Blood, UA neg    pH, UA 6.5    Protein, UA neg    Urobilinogen, UA 0.2    Nitrite, UA neg    Leukocytes, UA Negative Negative   Assessment & Plan:   Problem List Items Addressed This Visit    Umbilical hernia    New problem. Found incidentally on exam today. Discussed with patient today and he would like  to see a general surgeon for elective repair. Referral to general surgery placed.      Relevant Orders   Ambulatory referral to General Surgery   Dysuria    Unclear etiology. Negative urinalysis today. Sending to urology for further evaluation (especially in the setting of his other complaints).      Relevant Orders   POCT Urinalysis Dipstick (Completed)   Ambulatory referral to Urology   Acute bronchitis - Primary    New problem. Treating with Prednisone and Azithromycin.      Abdominal pain, chronic, bilateral lower quadrant    Established problem, recurrent. Unclear etiology. Patient reports associated dysuria. He also has marked testicular pain. Sending to urology for further evaluation. Referral placed.       Relevant Medications   predniSONE (DELTASONE) 50 MG tablet   Other Relevant Orders   Ambulatory referral to Urology    Other Visit Diagnoses    Testicular pain        Relevant Orders    Ambulatory referral to Urology       Meds ordered this encounter  Medications  . predniSONE (DELTASONE) 50 MG tablet    Sig: 1 tablet daily x 5 days.    Dispense:  5 tablet    Refill:  0  . azithromycin (ZITHROMAX) 250 MG tablet    Sig: 2 tablets on Day 1 then 1 tablet daily on Days 2-5.    Dispense:  6 tablet    Refill:  0    Follow-up: Return if symptoms worsen or fail to improve.  [place]

## 2015-07-06 NOTE — Assessment & Plan Note (Signed)
Established problem, recurrent. Unclear etiology. Patient reports associated dysuria. He also has marked testicular pain. Sending to urology for further evaluation. Referral placed.

## 2015-07-16 ENCOUNTER — Encounter: Payer: Self-pay | Admitting: Obstetrics and Gynecology

## 2015-07-16 ENCOUNTER — Ambulatory Visit: Payer: Self-pay | Admitting: Obstetrics and Gynecology

## 2015-10-10 DIAGNOSIS — Z8679 Personal history of other diseases of the circulatory system: Secondary | ICD-10-CM | POA: Insufficient documentation

## 2015-10-10 DIAGNOSIS — Z9229 Personal history of other drug therapy: Secondary | ICD-10-CM | POA: Insufficient documentation

## 2015-10-16 DIAGNOSIS — G459 Transient cerebral ischemic attack, unspecified: Secondary | ICD-10-CM | POA: Insufficient documentation

## 2016-03-20 ENCOUNTER — Telehealth: Payer: Self-pay

## 2016-03-20 NOTE — Telephone Encounter (Signed)
Patient came in reporting that he has been not feeling well since last Thursday.  Last time patient was feeling very tired patient was told it was due to AFIB.  Patient reported that he is not experiencing fast heart rate , palpitations, chest pain, or blurred vision.  He is however experiencing Sinus congestion, loss of hearing, and Sinus headaches.  Patient has just return from business trip and will be going on another one next week.  Patient feels like it is not Afib, he was more concern about sinus issues.  Patient was instructed that if he is suspecting Afib or having any SX of chest pain, palpitations and lightheadedness he should go to ED. Patient stated he will comply.  BP-118/72 Left arm normal cuff HR-79 SPO2-97% Temp-97.8 F

## 2016-03-20 NOTE — Telephone Encounter (Signed)
Care was provided under my supervision. I agree with the management as indicated in the note.  Tekeshia Klahr DO  

## 2016-03-24 ENCOUNTER — Ambulatory Visit (INDEPENDENT_AMBULATORY_CARE_PROVIDER_SITE_OTHER): Payer: Self-pay | Admitting: Family Medicine

## 2016-03-24 ENCOUNTER — Encounter: Payer: Self-pay | Admitting: Family Medicine

## 2016-03-24 DIAGNOSIS — B349 Viral infection, unspecified: Secondary | ICD-10-CM | POA: Insufficient documentation

## 2016-03-24 NOTE — Assessment & Plan Note (Signed)
New problem, improving. Patient appears to have had recent infection and is doing well at this time. Advised supportive care. Follow up as needed.

## 2016-03-24 NOTE — Progress Notes (Signed)
Subjective:  Patient ID: Jake Liu, male    DOB: Sep 14, 1960  Age: 55 y.o. MRN: [id number]  CC:  Not feeling well  HPI:  55 year old male with a history of a fibrillation status post cardioversion presents with complaints of not feeling well.  Patient states over the past 2 weeks he's been tired and has been experiencing sinus pressure, headache, and a weird taste in his mouth. He's been having significant fatigue and hearing difficulty as well. He states that his symptoms have slowly been improving. He states that he's feeling better today but wanted to make sure that everything was okay given his history of atrial fibrillation. No reports of purulent nasal discharge. No associated fever or chills. No known exacerbating or relieving factors. No other complaints at this time.  Social Hx   Social History   Social History  . Marital status: Single    Spouse name: N/A  . Number of children: N/A  . Years of education: N/A   Social History Main Topics  . Smoking status: Never Smoker  . Smokeless tobacco: Never Used  . Alcohol use 3.5 oz/week    7 drink(s) per week     Comment: drinks at night.  Drinks 10 cups coffee daily  . Drug use: No  . Sexual activity: Not Asked   Other Topics Concern  . None   Social History Narrative  . None   Review of Systems  Constitutional: Positive for fatigue.  HENT: Positive for congestion and hearing loss.   Neurological: Positive for headaches.   Objective:  BP (!) 146/81 (BP Location: Right Arm, Patient Position: Sitting, Cuff Size: Normal)   Pulse 76   Temp 97.9 F (36.6 C) (Oral)   Wt 212 lb 6 oz (96.3 kg)   SpO2 99%   BMI 28.80 kg/m   BP/Weight 03/24/2016 07/06/2015 [id number]  Systolic BP [id number]  Diastolic BP 81 78 86  Wt. (Lbs) 212.38 231.75 230.6  BMI 28.8 31.42 31.27   Physical Exam  Constitutional: He is oriented to person, place, and time. He appears well-developed. No distress.  HENT:  Head: Normocephalic and  atraumatic.  Mouth/Throat: Oropharynx is clear and moist.  Normal TM's bilaterally.  Cardiovascular: Normal rate and regular rhythm.   Pulmonary/Chest: Effort normal. He has no wheezes. He has no rales.  Neurological: He is alert and oriented to person, place, and time.  Psychiatric: He has a normal mood and affect.  Vitals reviewed.  Lab Results  Component Value Date   WBC 5.8 01/05/2015   HGB 15.1 01/05/2015   HCT 45.0 01/05/2015   PLT 257.0 01/05/2015   GLUCOSE 102 (H) 01/05/2015   CHOL 192 01/05/2015   TRIG 82.0 01/05/2015   HDL 51.00 01/05/2015   LDLCALC 125 (H) 01/05/2015   ALT 68 (H) 01/05/2015   AST 63 (H) 01/05/2015   NA 137 01/05/2015   K 4.9 01/05/2015   CL 103 01/05/2015   CREATININE 0.68 01/05/2015   BUN 16 01/05/2015   CO2 30 01/05/2015   TSH 2.00 12/07/2013   PSA 0.26 01/05/2015   HGBA1C 5.3 06/05/2011    Assessment & Plan:   Problem List Items Addressed This Visit    Viral illness    New problem, improving. Patient appears to have had recent infection and is doing well at this time. Advised supportive care. Follow up as needed.        Other Visit Diagnoses   None.    Follow-up: PRN  Anorah Trias  [place]

## 2016-04-15 DIAGNOSIS — I517 Cardiomegaly: Secondary | ICD-10-CM | POA: Insufficient documentation

## 2016-04-20 ENCOUNTER — Emergency Department: Payer: Commercial Managed Care - PPO

## 2016-04-20 ENCOUNTER — Encounter: Payer: Self-pay | Admitting: Emergency Medicine

## 2016-04-20 ENCOUNTER — Emergency Department
Admission: EM | Admit: 2016-04-20 | Discharge: 2016-04-20 | Disposition: A | Discharge status: Home / Self Care | Payer: Commercial Managed Care - PPO | Attending: Emergency Medicine | Admitting: Emergency Medicine

## 2016-04-20 DIAGNOSIS — Z79899 Other long term (current) drug therapy: Secondary | ICD-10-CM | POA: Diagnosis not present

## 2016-04-20 DIAGNOSIS — S93402A Sprain of unspecified ligament of left ankle, initial encounter: Secondary | ICD-10-CM | POA: Diagnosis not present

## 2016-04-20 DIAGNOSIS — Y999 Unspecified external cause status: Secondary | ICD-10-CM | POA: Diagnosis not present

## 2016-04-20 DIAGNOSIS — I10 Essential (primary) hypertension: Secondary | ICD-10-CM | POA: Insufficient documentation

## 2016-04-20 DIAGNOSIS — S2232XA Fracture of one rib, left side, initial encounter for closed fracture: Secondary | ICD-10-CM | POA: Diagnosis not present

## 2016-04-20 DIAGNOSIS — S299XXA Unspecified injury of thorax, initial encounter: Secondary | ICD-10-CM | POA: Diagnosis present

## 2016-04-20 DIAGNOSIS — S8002XA Contusion of left knee, initial encounter: Secondary | ICD-10-CM | POA: Diagnosis not present

## 2016-04-20 DIAGNOSIS — W010XXA Fall on same level from slipping, tripping and stumbling without subsequent striking against object, initial encounter: Secondary | ICD-10-CM | POA: Insufficient documentation

## 2016-04-20 DIAGNOSIS — S80212A Abrasion, left knee, initial encounter: Secondary | ICD-10-CM

## 2016-04-20 DIAGNOSIS — Y9302 Activity, running: Secondary | ICD-10-CM | POA: Diagnosis not present

## 2016-04-20 DIAGNOSIS — Y929 Unspecified place or not applicable: Secondary | ICD-10-CM | POA: Insufficient documentation

## 2016-04-20 LAB — CBC WITH DIFFERENTIAL/PLATELET
BASOS PCT: 1 %
Basophils Absolute: 0.1 10*3/uL (ref 0–0.1)
EOS ABS: 0.2 10*3/uL (ref 0–0.7)
Eosinophils Relative: 5 %
HCT: 43.9 % (ref 40.0–52.0)
HEMOGLOBIN: 15.1 g/dL (ref 13.0–18.0)
Lymphocytes Relative: 31 %
Lymphs Abs: 1.5 10*3/uL (ref 1.0–3.6)
MCH: 31.2 pg (ref 26.0–34.0)
MCHC: 34.3 g/dL (ref 32.0–36.0)
MCV: 90.8 fL (ref 80.0–100.0)
MONOS PCT: 11 %
Monocytes Absolute: 0.5 10*3/uL (ref 0.2–1.0)
NEUTROS PCT: 52 %
Neutro Abs: 2.6 10*3/uL (ref 1.4–6.5)
PLATELETS: 228 10*3/uL (ref 150–440)
RBC: 4.83 MIL/uL (ref 4.40–5.90)
RDW: 13.2 % (ref 11.5–14.5)
WBC: 5 10*3/uL (ref 3.8–10.6)

## 2016-04-20 LAB — COMPREHENSIVE METABOLIC PANEL
ALK PHOS: 53 U/L (ref 38–126)
ALT: 43 U/L (ref 17–63)
AST: 33 U/L (ref 15–41)
Albumin: 4 g/dL (ref 3.5–5.0)
Anion gap: 7 (ref 5–15)
BUN: 18 mg/dL (ref 6–20)
CALCIUM: 9.1 mg/dL (ref 8.9–10.3)
CO2: 24 mmol/L (ref 22–32)
CREATININE: 0.63 mg/dL (ref 0.61–1.24)
Chloride: 107 mmol/L (ref 101–111)
Glucose, Bld: 105 mg/dL — ABNORMAL HIGH (ref 65–99)
Potassium: 4 mmol/L (ref 3.5–5.1)
Sodium: 138 mmol/L (ref 135–145)
Total Bilirubin: 1 mg/dL (ref 0.3–1.2)
Total Protein: 6.8 g/dL (ref 6.5–8.1)

## 2016-04-20 LAB — TROPONIN I: Troponin I: <0.03 ng/mL (ref <0.03)

## 2016-04-20 IMAGING — CR DG RIBS W/ CHEST 3+V*L*
1 series · 6 of 6 positions shown · non-contrast
Comparison: Chest radiographs dated [DATE]

CLINICAL DATA: Fall, left lateral rib pain

EXAM:
LEFT RIBS AND CHEST - 3+ VIEW

[Series 1: dg ribs unilateral w/chest left · 0.14mm/px · 6 of 6 slices shown]
[im 1/6]
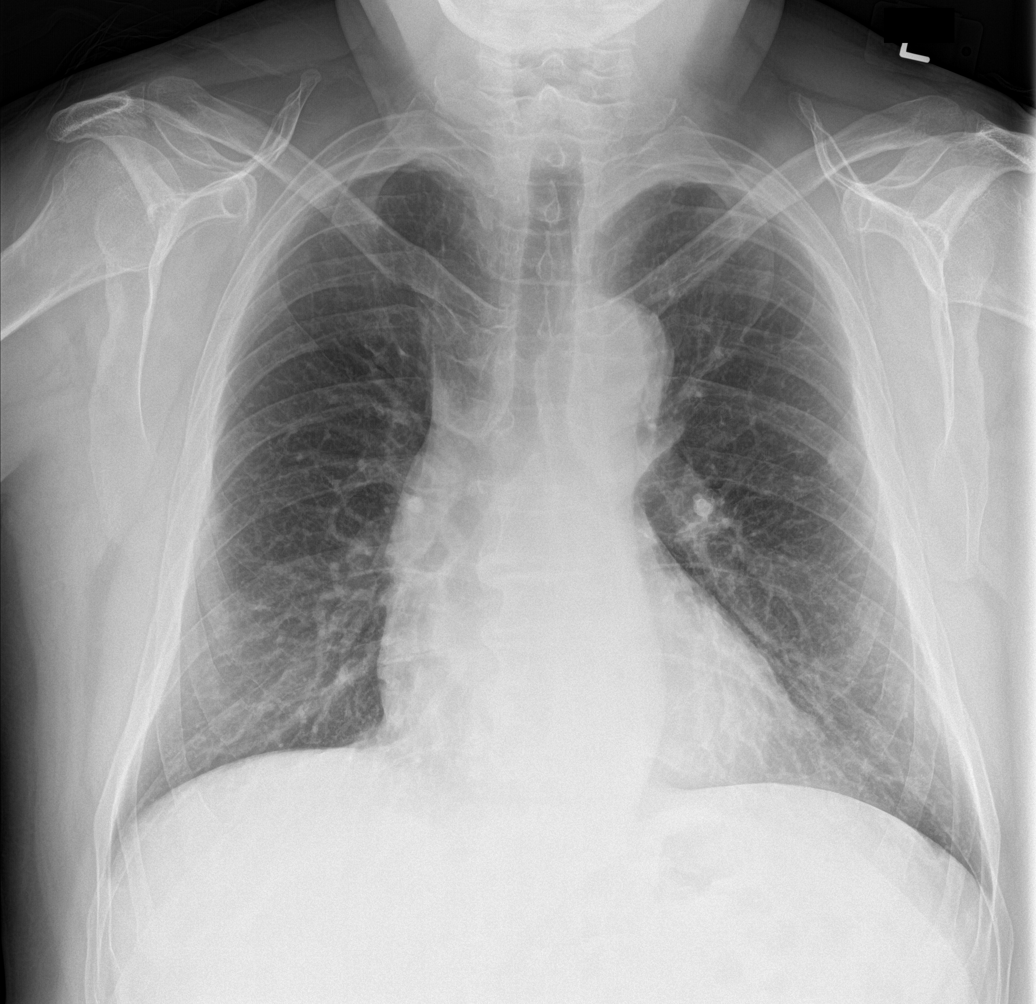
[im 2/6]
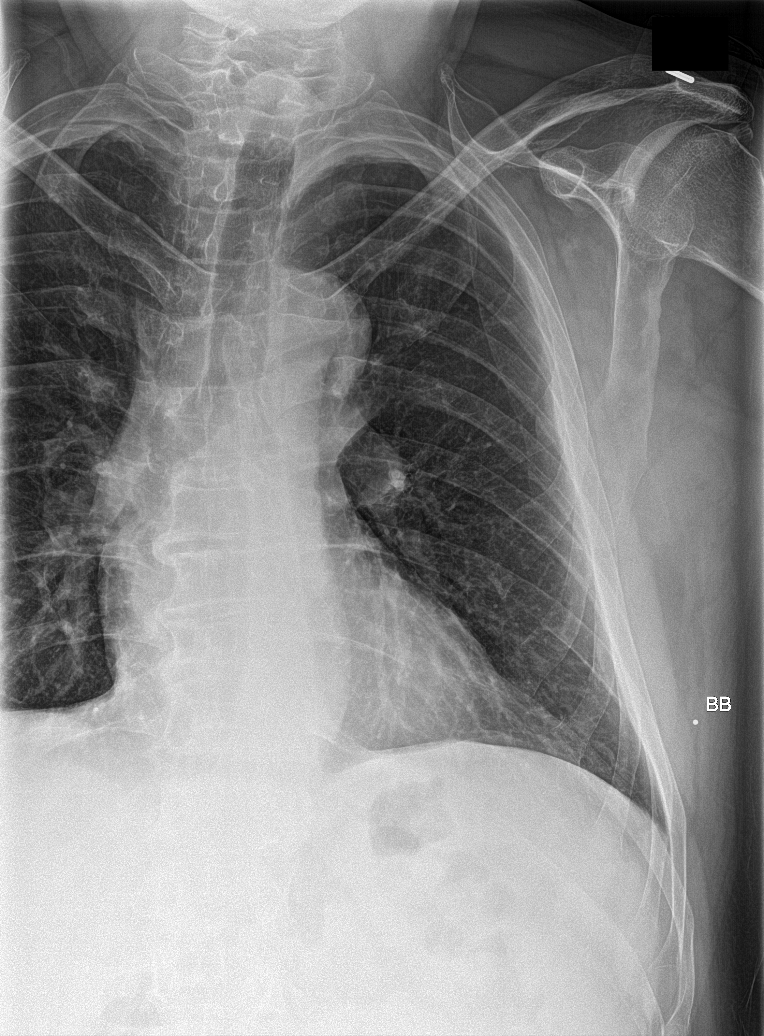
[im 3/6]
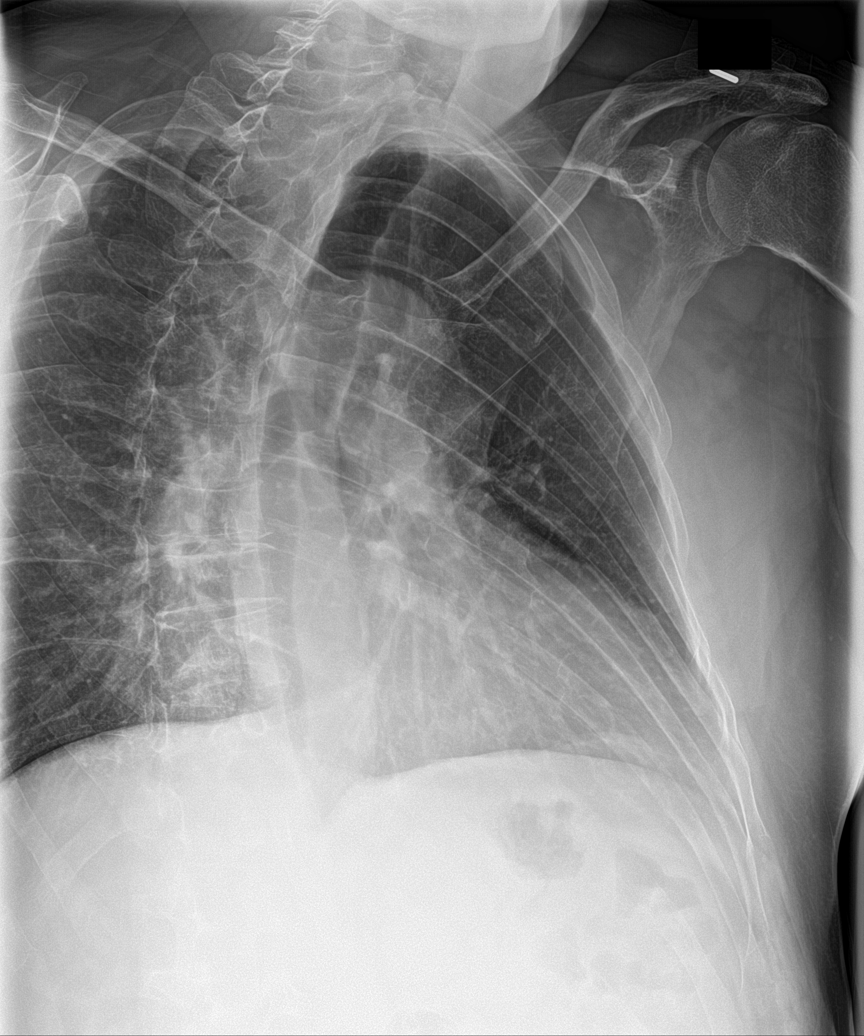
[im 4/6]
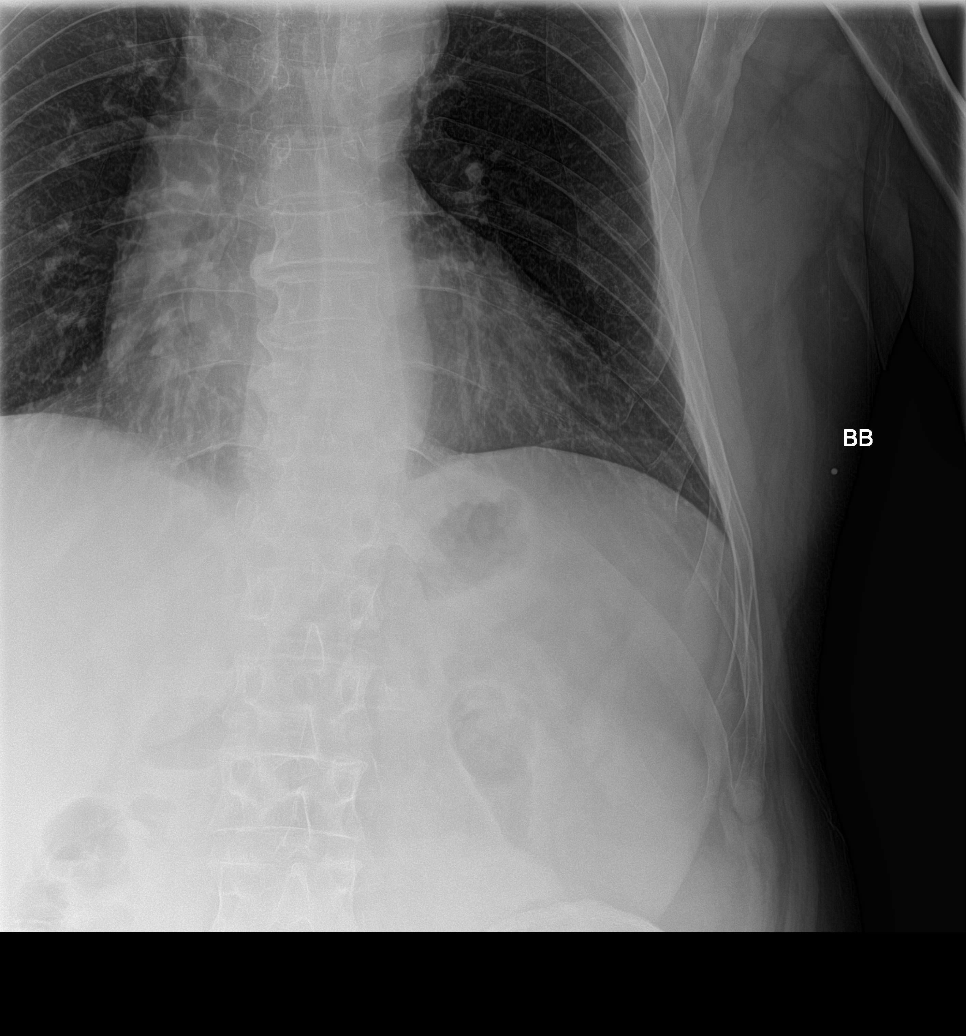
[im 5/6]
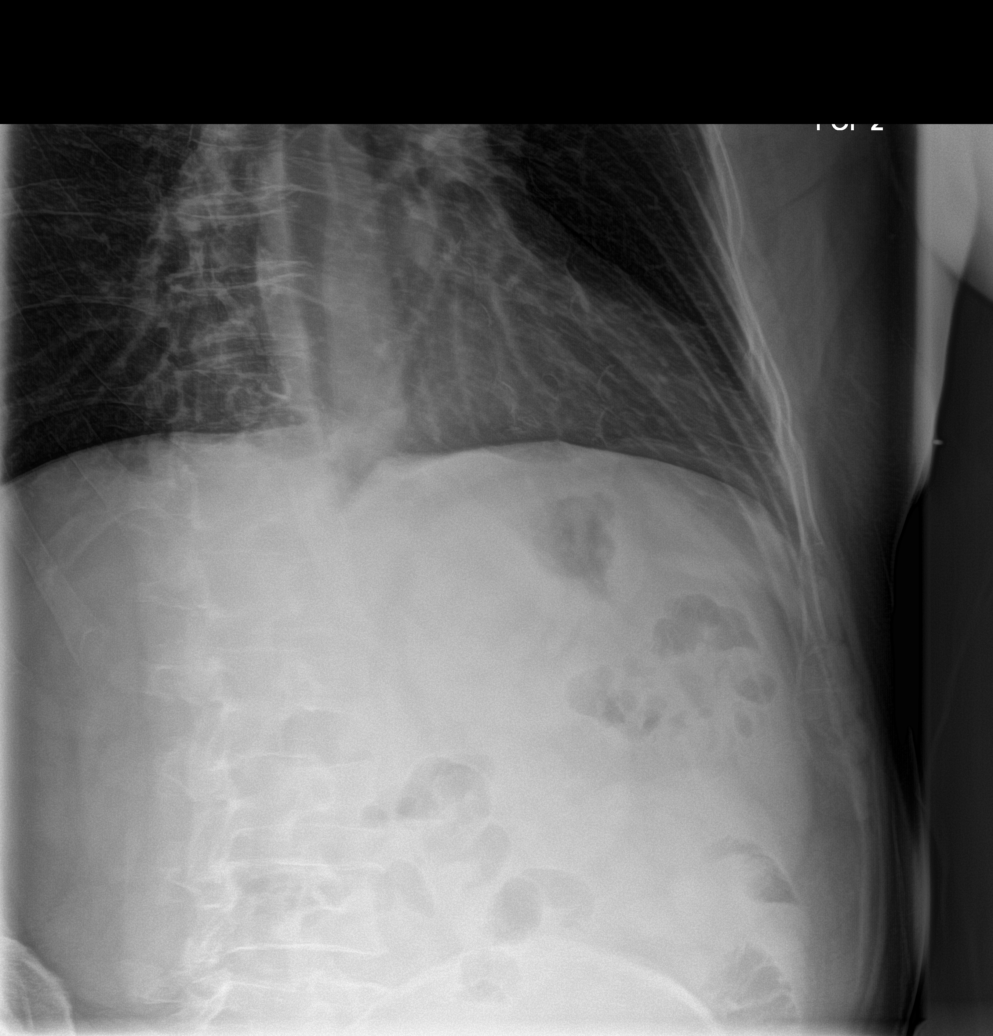
[im 6/6]
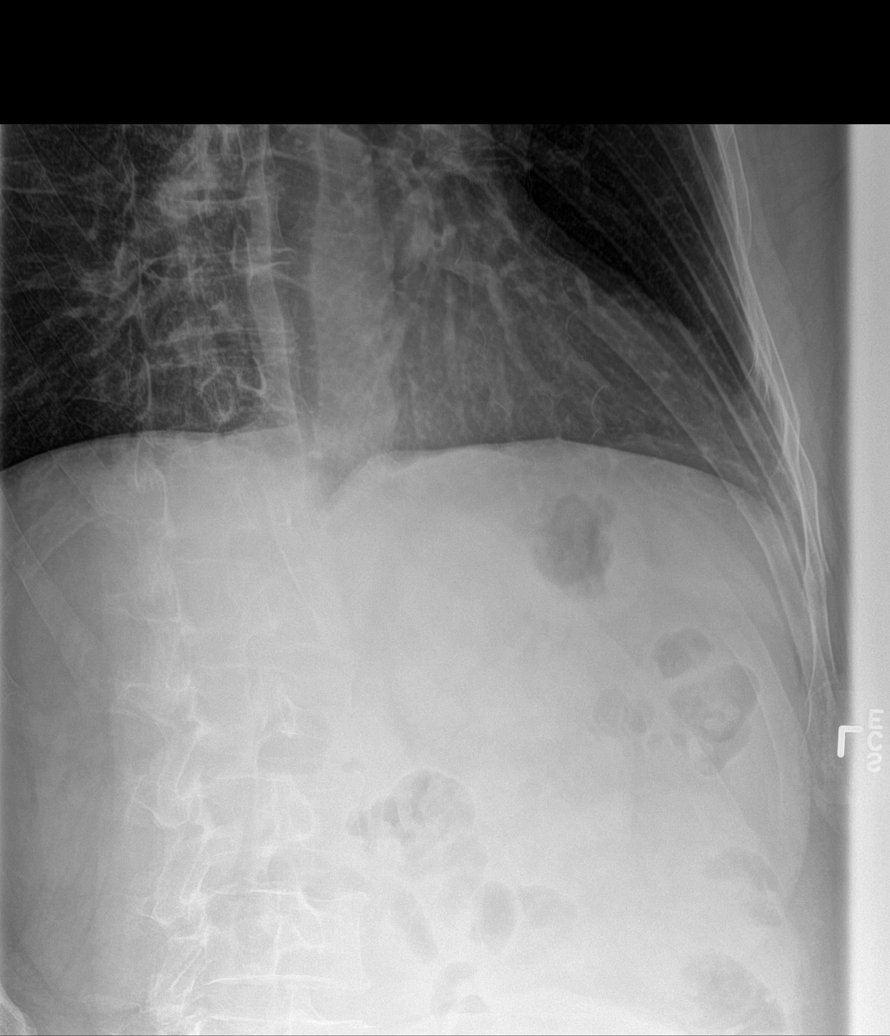

[6 of 6 positions shown; findings below may reference images not displayed]

FINDINGS: Lungs are clear.  No pleural effusion or pneumothorax.

The heart is normal in size.

Nondisplaced left lateral 3rd rib fracture.
IMPRESSION: Nondisplaced left lateral 3rd rib fracture.

No evidence of acute cardiopulmonary disease.

## 2016-04-20 IMAGING — CR DG ANKLE COMPLETE 3+V*L*
1 series · 3 of 3 positions shown · non-contrast
Comparison: None.

CLINICAL DATA: Fall 2 weeks ago

EXAM:
LEFT ANKLE COMPLETE - 3+ VIEW

[Series 1: dg ankle complete left · 0.14mm/px · 3 of 3 slices shown]
[im 1/3]
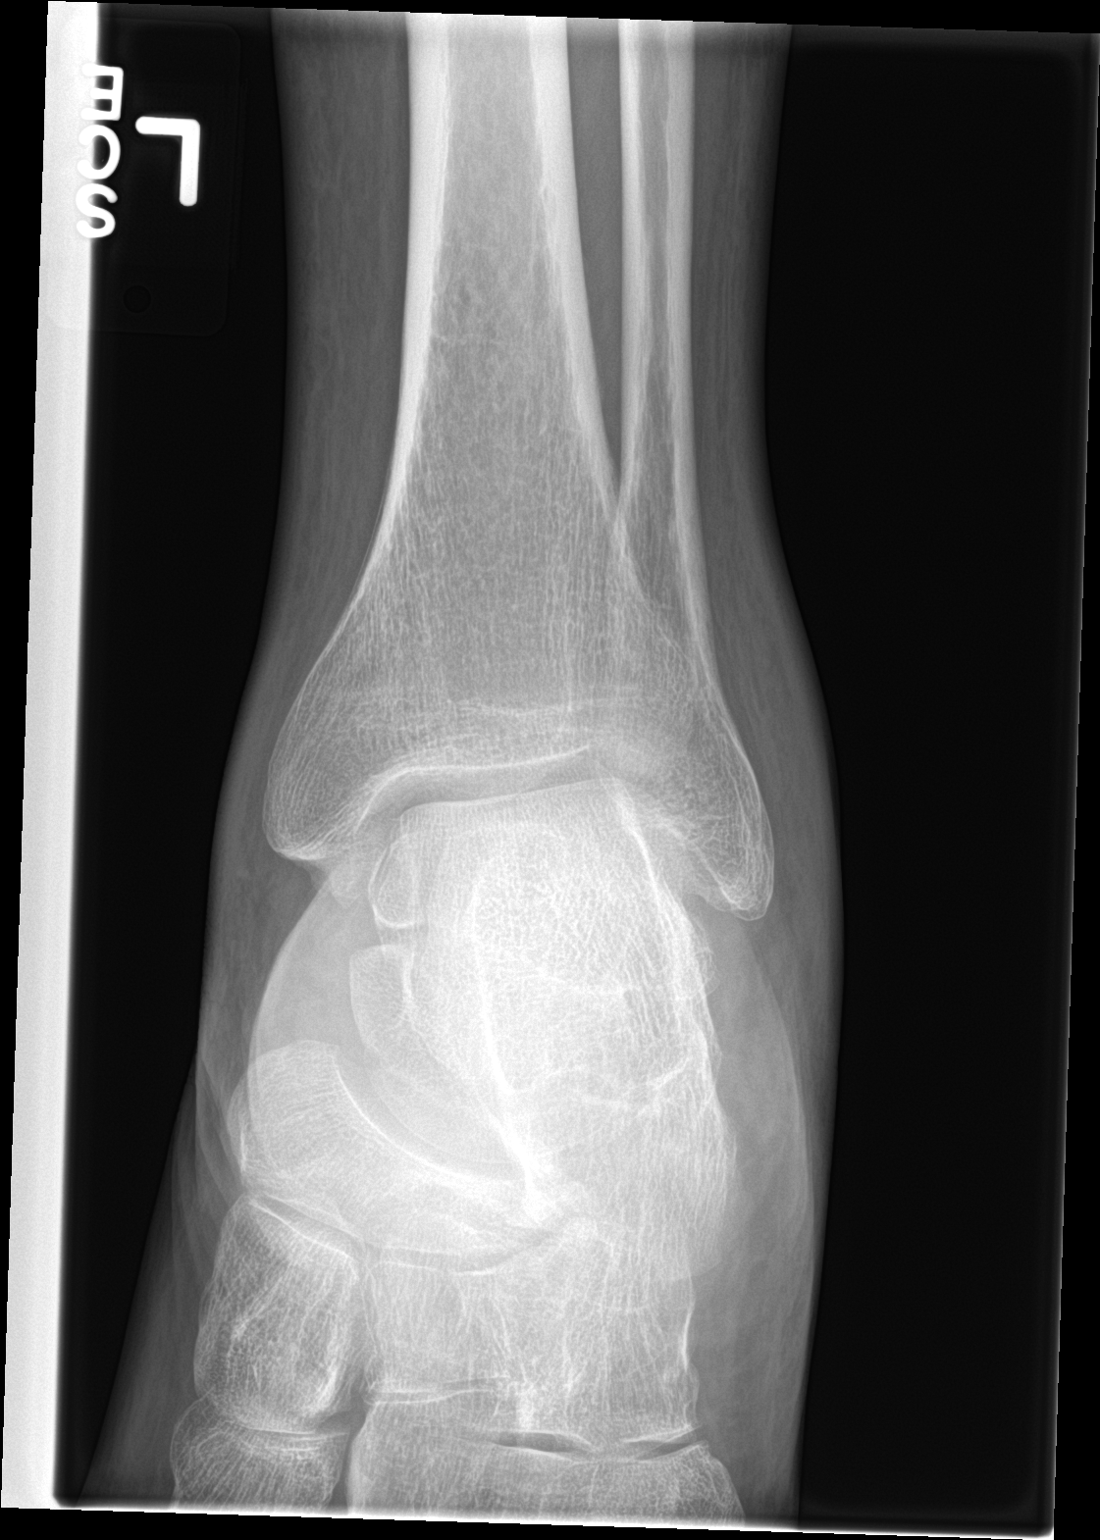
[im 2/3]
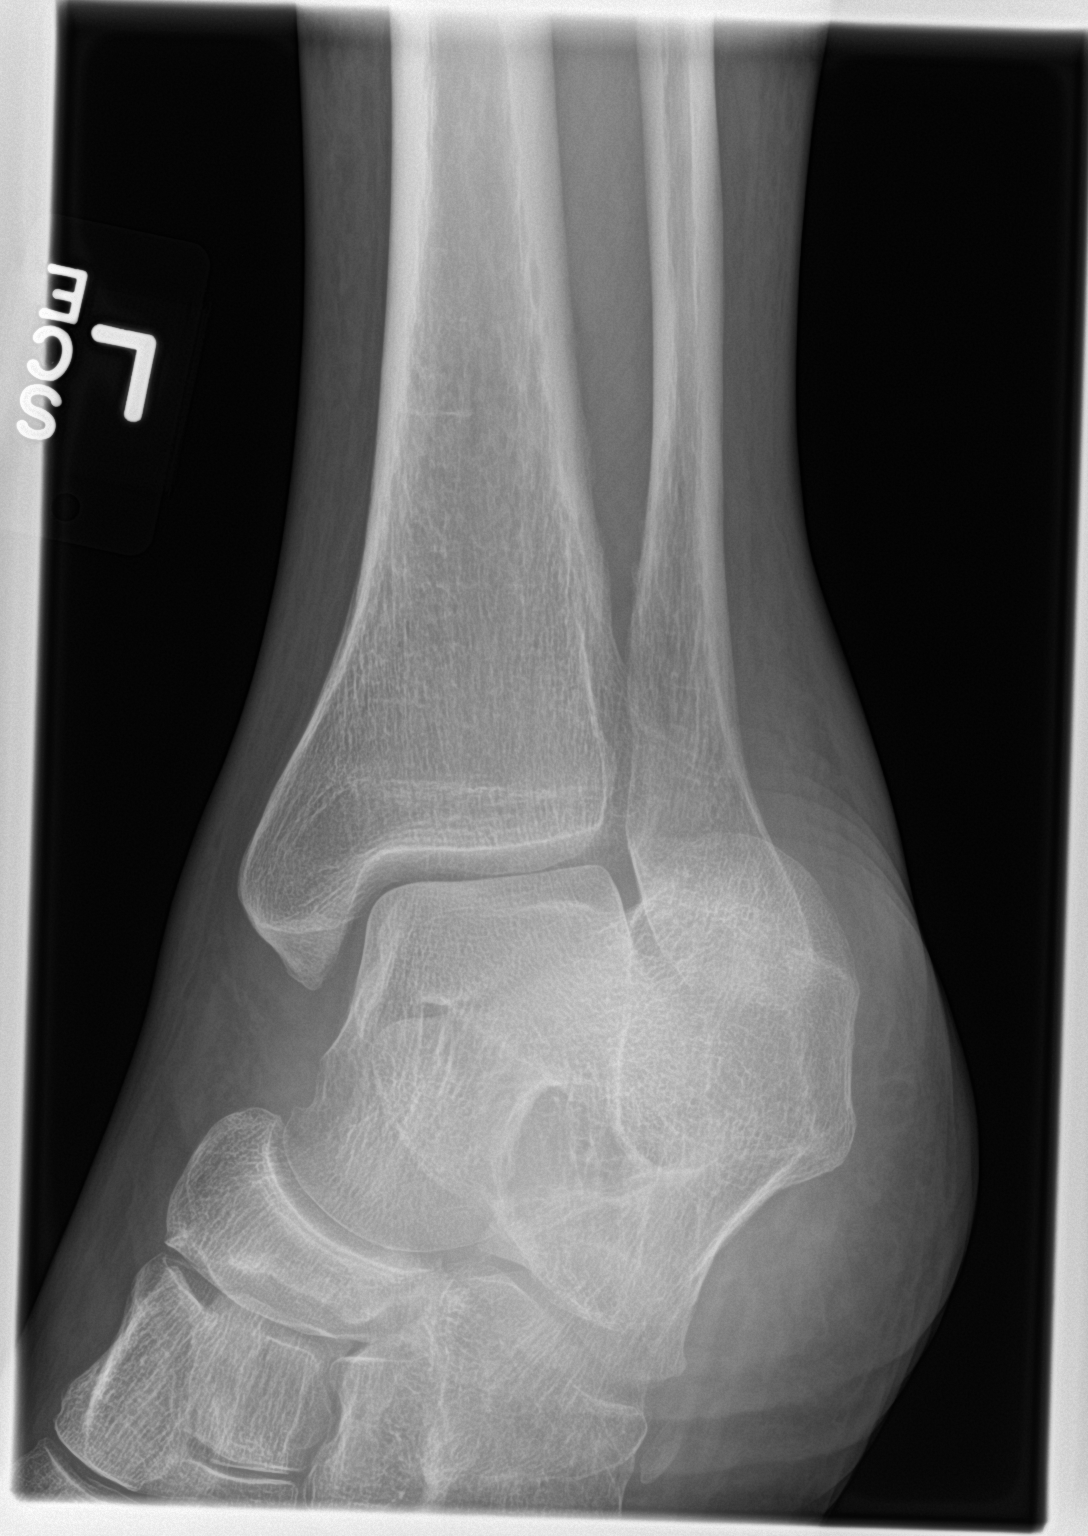
[im 3/3]
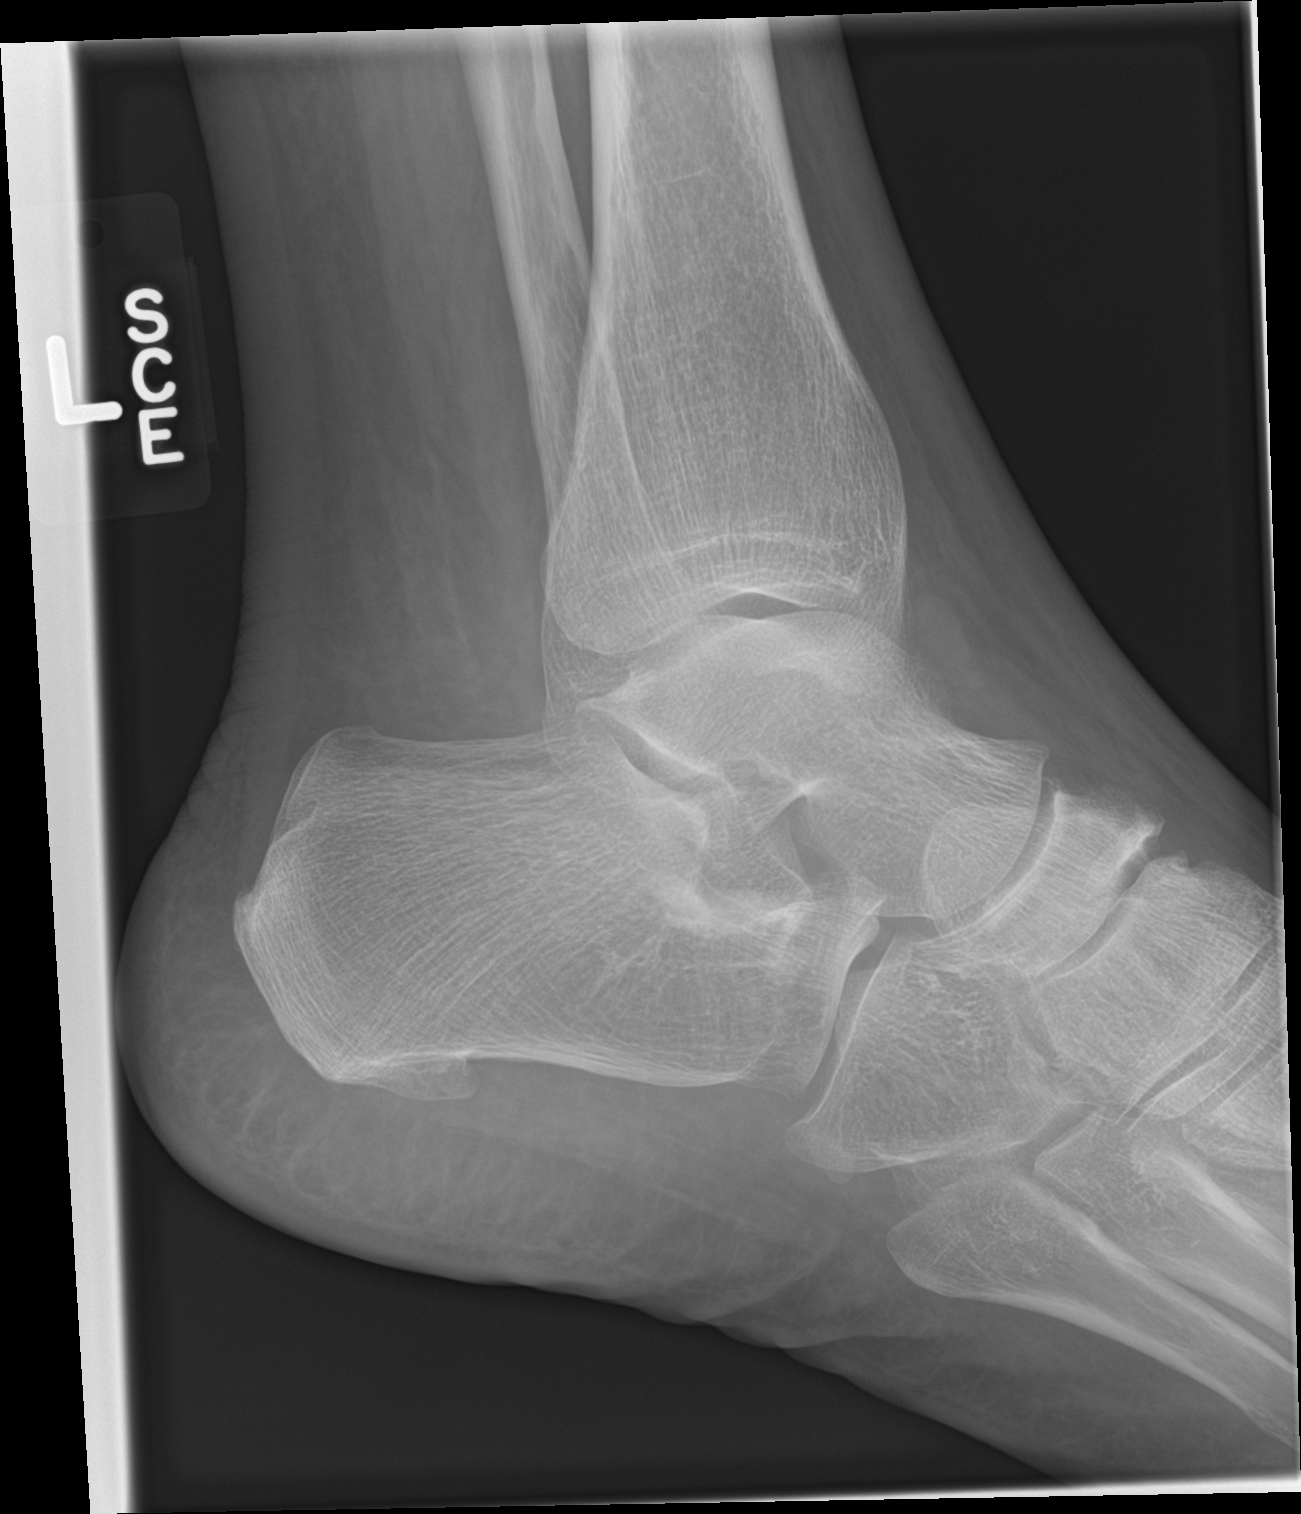

[3 of 3 positions shown; findings below may reference images not displayed]

FINDINGS: No fracture or dislocation is seen.

The ankle mortise is intact.

The base of the fifth metatarsal is unremarkable.

Small plantar calcaneal enthesophyte.

Mild lateral soft tissue swelling.
IMPRESSION: No fracture or dislocation is seen.

## 2016-04-20 IMAGING — CR DG FOOT COMPLETE 3+V*L*
1 series · 3 of 3 positions shown · non-contrast
Comparison: None.

CLINICAL DATA: Fall 2 weeks ago

EXAM:
LEFT FOOT - COMPLETE 3+ VIEW

[Series 1: dg foot complete left · 0.14mm/px · 3 of 3 slices shown]
[im 1/3]
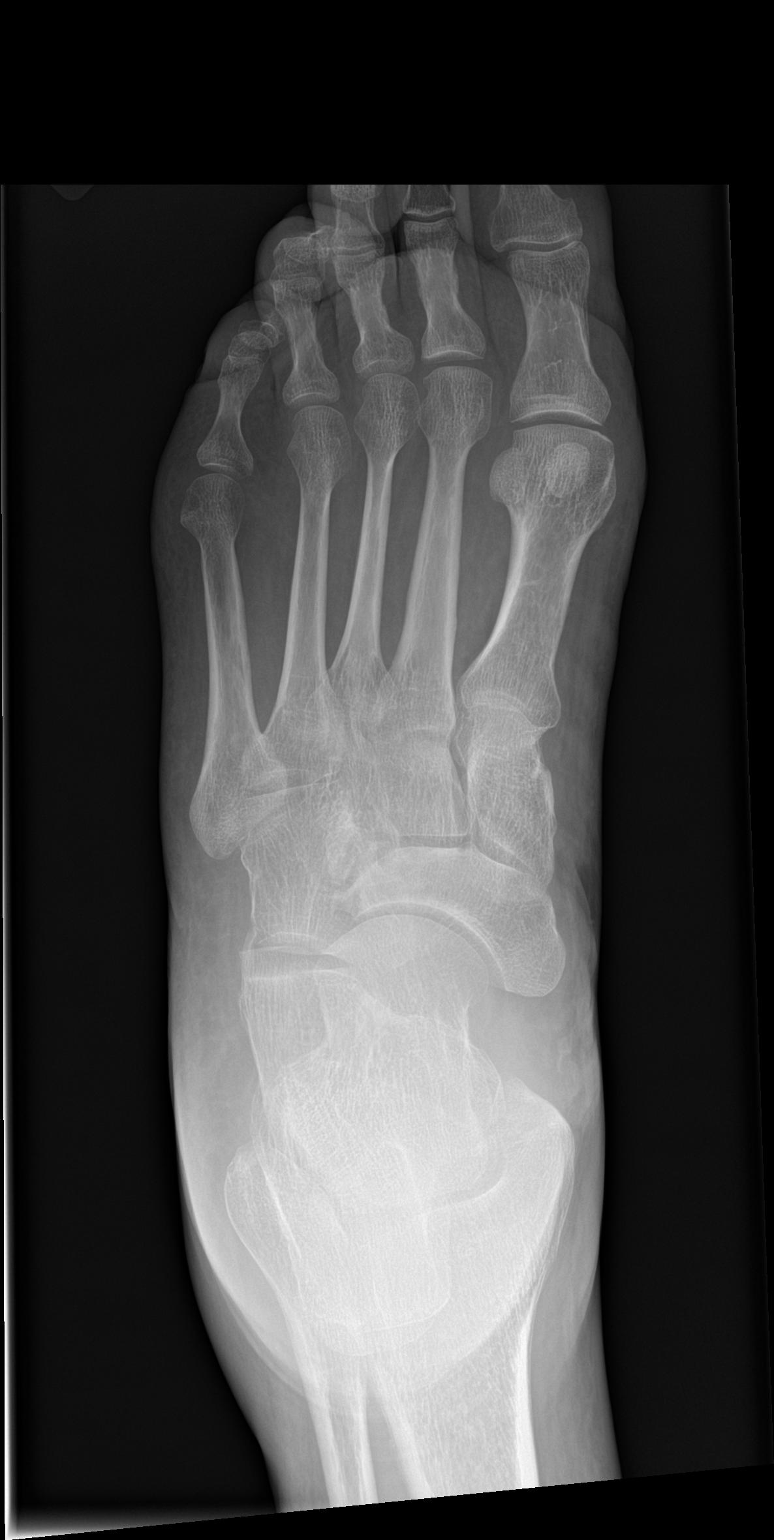
[im 2/3]
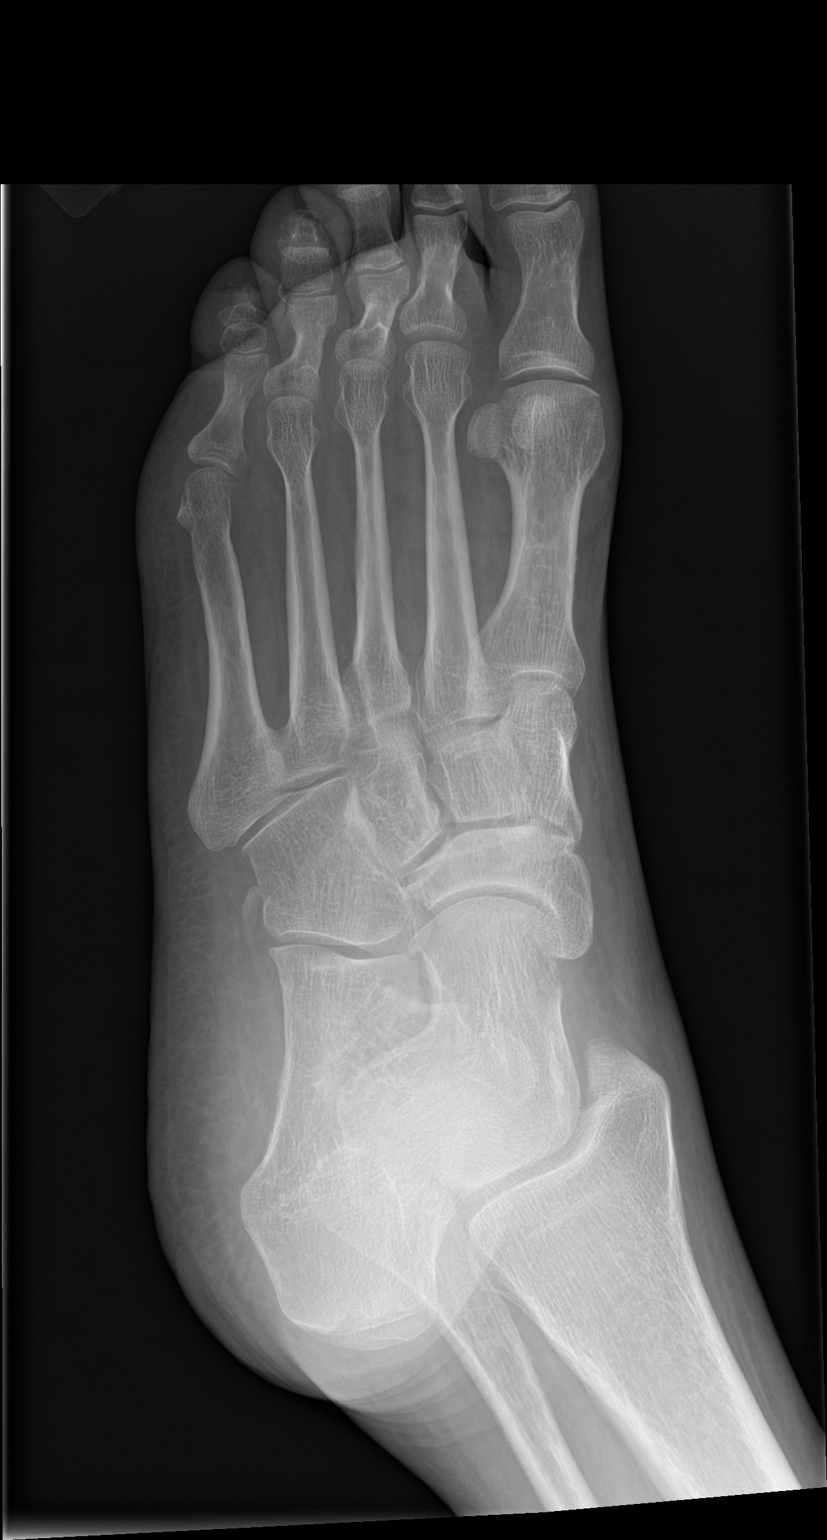
[im 3/3]
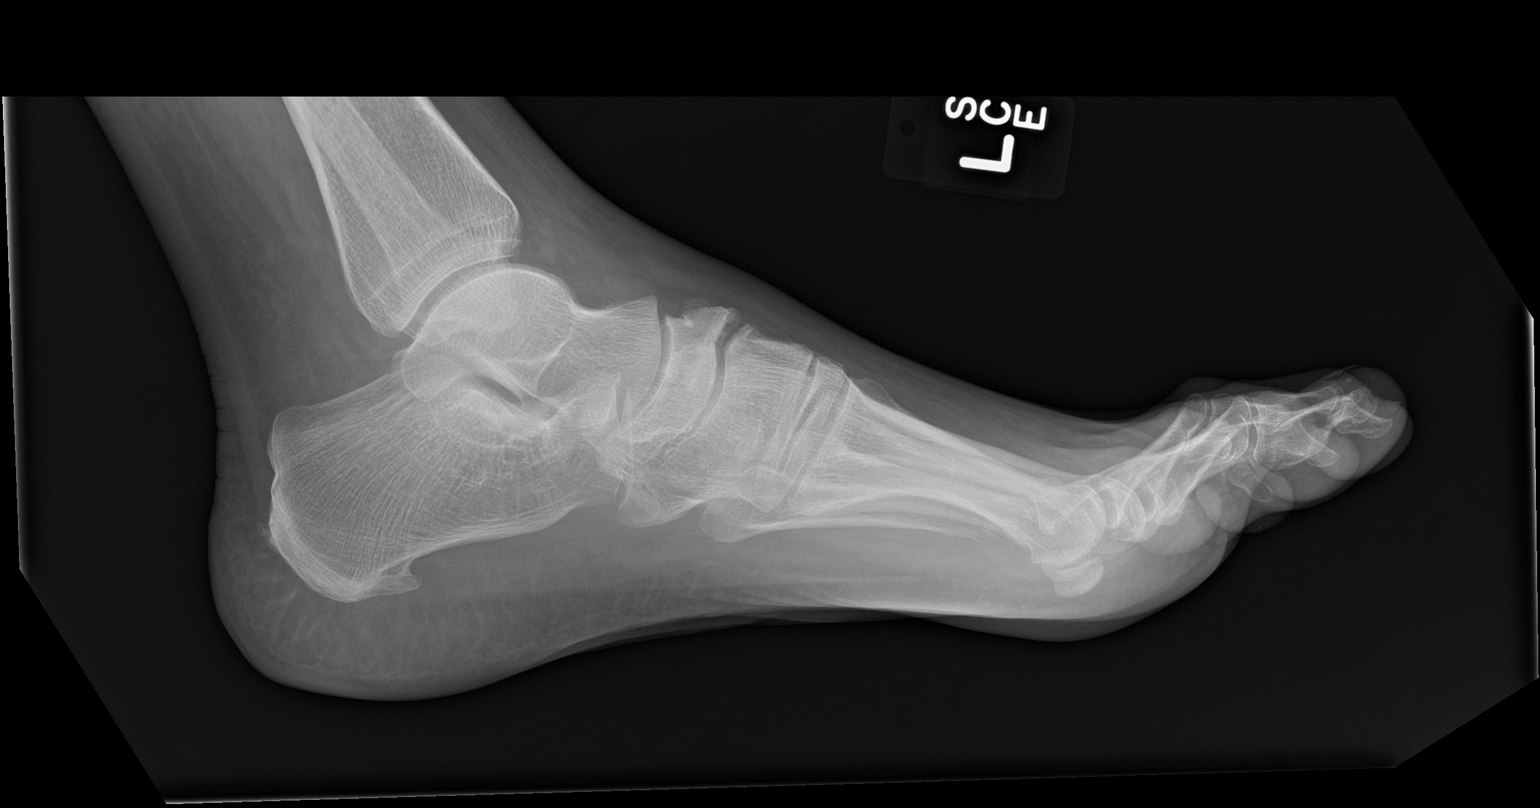

[3 of 3 positions shown; findings below may reference images not displayed]

FINDINGS: No fracture or dislocation is seen.

The joint spaces are preserved.

The visualized soft tissues are unremarkable.

Mild degenerative changes the dorsal midfoot.

Small plantar calcaneal enthesophyte.
IMPRESSION: No fracture or dislocation is seen.

## 2016-04-20 IMAGING — CR DG KNEE COMPLETE 4+V*L*
1 series · 4 of 4 positions shown · non-contrast
Comparison: None.

CLINICAL DATA: Fall 2 weeks ago, left knee pain

EXAM:
LEFT KNEE - COMPLETE 4+ VIEW

[Series 1: dg knee complete 4 views left · 0.14mm/px · 4 of 4 slices shown]
[im 1/4]
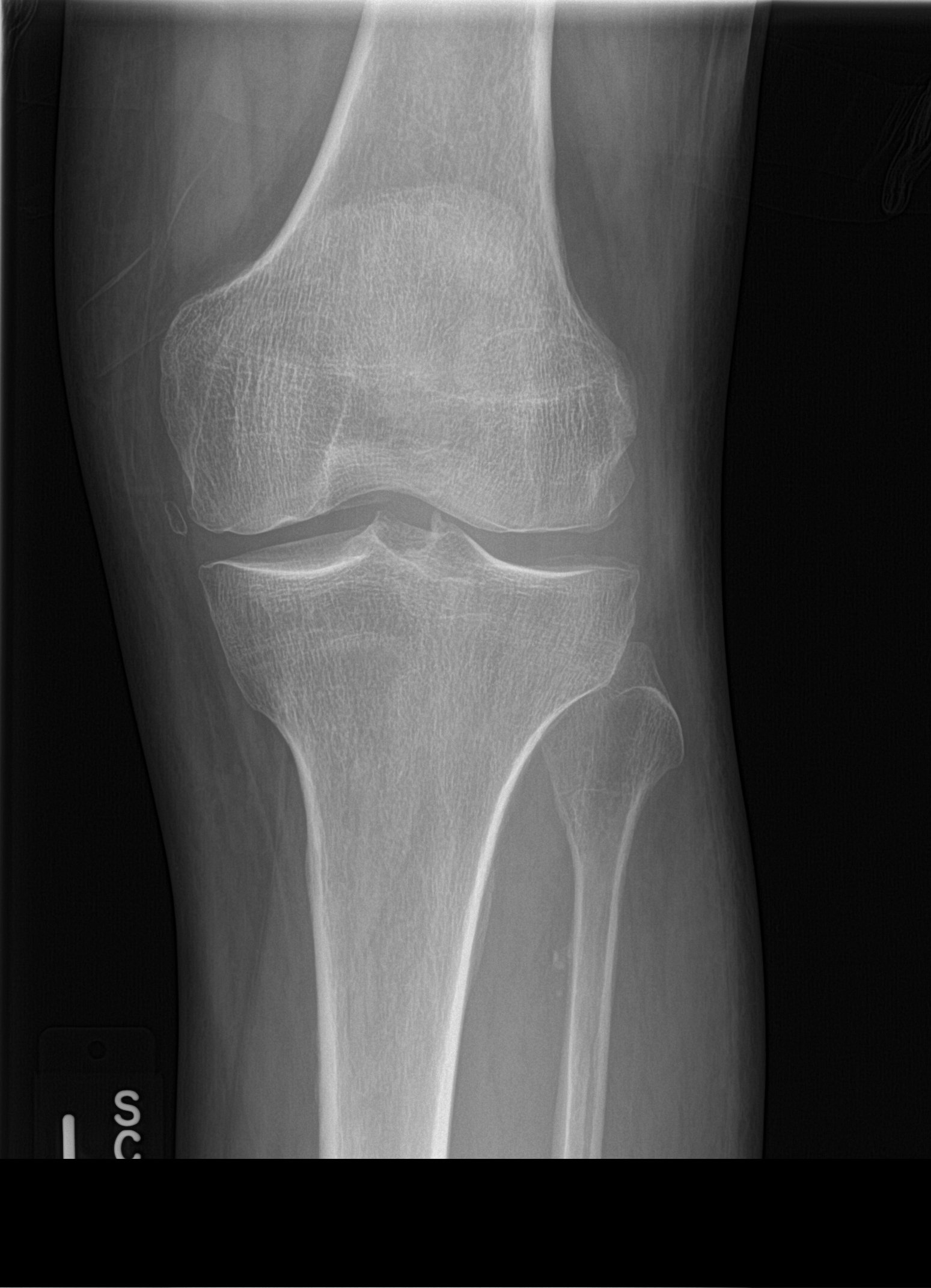
[im 2/4]
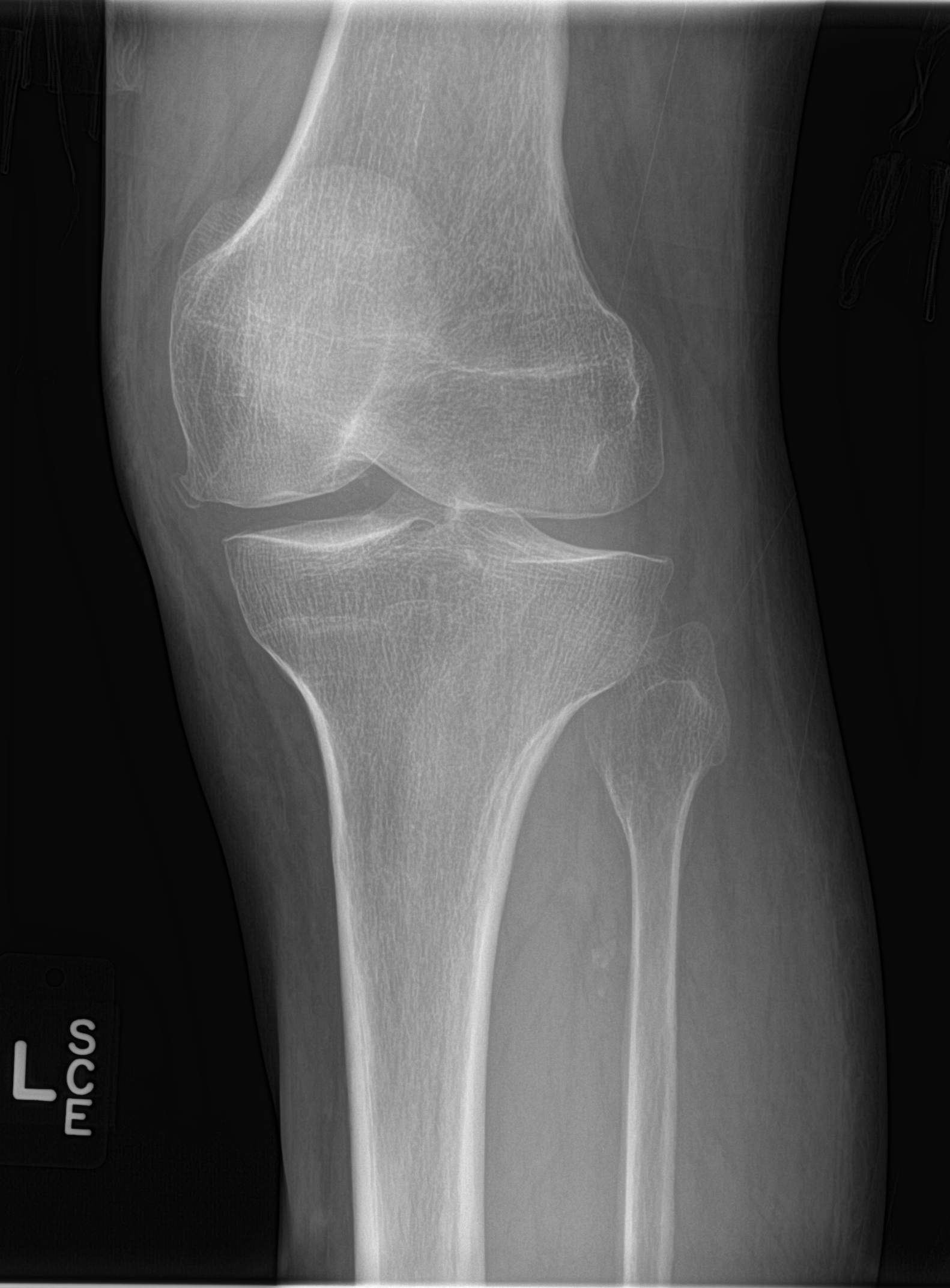
[im 3/4]
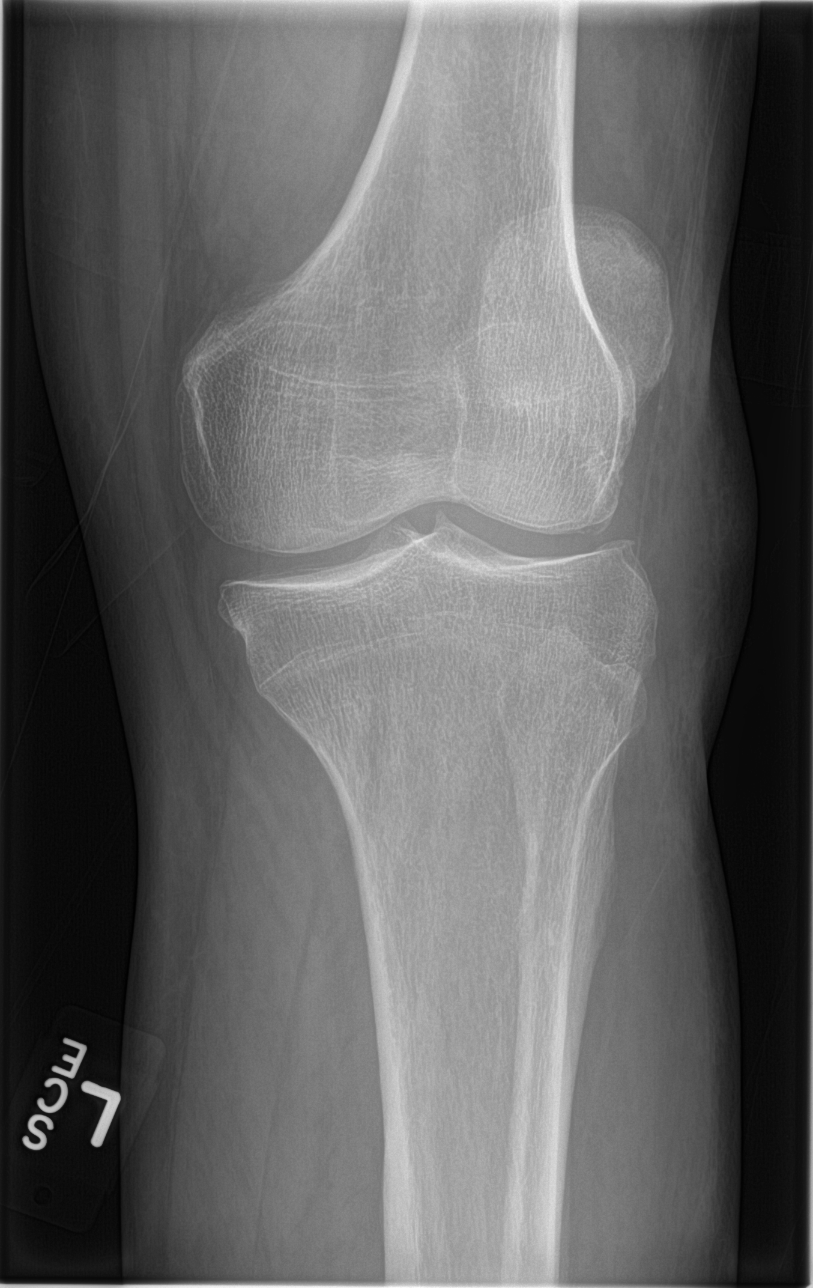
[im 4/4]
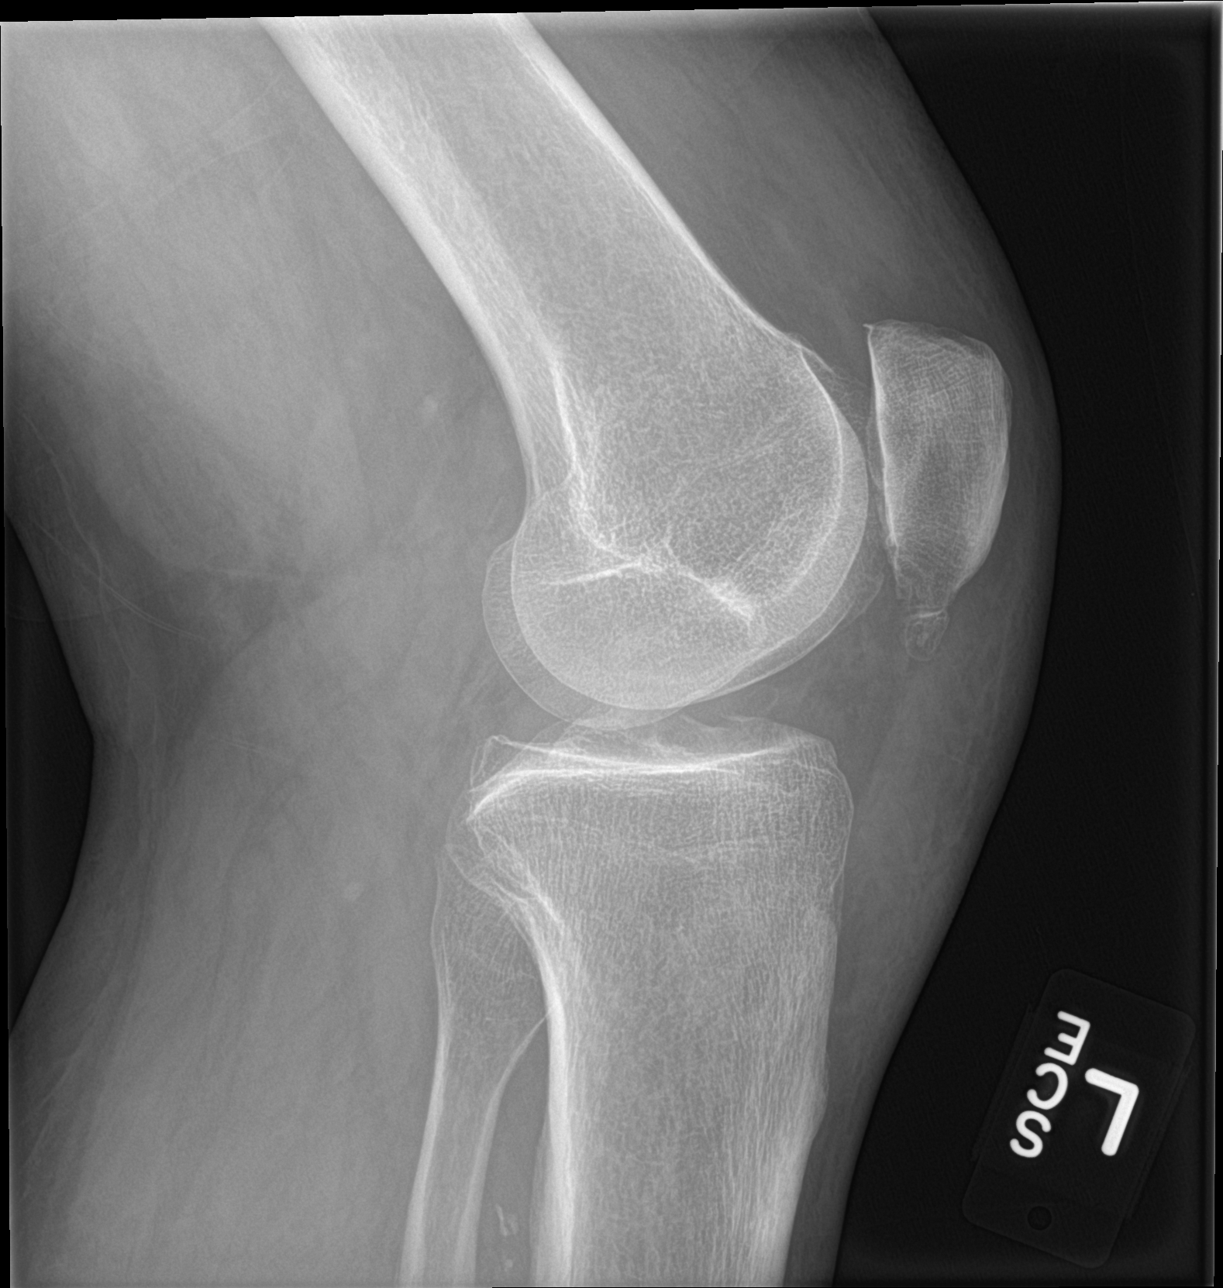

[4 of 4 positions shown; findings below may reference images not displayed]

FINDINGS: No fracture or dislocation is seen.

Mild degenerative changes with sharpening of the tibial spines and
lateral compartment/patellofemoral osteophytosis.

The visualized soft tissues are unremarkable.

No suprapatellar knee joint effusion.
IMPRESSION: No fracture or dislocation is seen.

Mild degenerative changes.

## 2016-04-20 NOTE — ED Triage Notes (Signed)
Pt arrived to ED with c/o of left leg pain, swelling and bruising. Pt fell 9 days ago on his knee and continues to have swelling and increased bruising that is spreading down to his toes. Pt takes blood thinners and travels frequently for work.
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
## 2016-04-20 NOTE — Discharge Instructions (Signed)
Your exam is reassuring here in the emergency department. You have been found to have a left-sided rib fracture which should heal up on its own.  Return to the emergency department for any worsening condition including fever, skin rash, worsening pain, trouble breathing, or any other symptoms concerning to you.

## 2016-05-31 ENCOUNTER — Emergency Department: Payer: Commercial Managed Care - PPO

## 2016-05-31 ENCOUNTER — Emergency Department
Admission: EM | Admit: 2016-05-31 | Discharge: 2016-05-31 | Payer: Commercial Managed Care - PPO | Attending: Emergency Medicine | Admitting: Emergency Medicine

## 2016-05-31 ENCOUNTER — Encounter: Payer: Self-pay | Admitting: Emergency Medicine

## 2016-05-31 DIAGNOSIS — Z79899 Other long term (current) drug therapy: Secondary | ICD-10-CM | POA: Diagnosis not present

## 2016-05-31 DIAGNOSIS — X04XXXA Exposure to ignition of highly flammable material, initial encounter: Secondary | ICD-10-CM | POA: Diagnosis not present

## 2016-05-31 DIAGNOSIS — Y939 Activity, unspecified: Secondary | ICD-10-CM | POA: Insufficient documentation

## 2016-05-31 DIAGNOSIS — T24201A Burn of second degree of unspecified site of right lower limb, except ankle and foot, initial encounter: Secondary | ICD-10-CM | POA: Insufficient documentation

## 2016-05-31 DIAGNOSIS — T2019XA Burn of first degree of multiple sites of head, face, and neck, initial encounter: Secondary | ICD-10-CM | POA: Diagnosis not present

## 2016-05-31 DIAGNOSIS — Y929 Unspecified place or not applicable: Secondary | ICD-10-CM | POA: Diagnosis not present

## 2016-05-31 DIAGNOSIS — I10 Essential (primary) hypertension: Secondary | ICD-10-CM | POA: Diagnosis not present

## 2016-05-31 DIAGNOSIS — Y999 Unspecified external cause status: Secondary | ICD-10-CM | POA: Insufficient documentation

## 2016-05-31 DIAGNOSIS — T300 Burn of unspecified body region, unspecified degree: Secondary | ICD-10-CM

## 2016-05-31 LAB — CBC
HCT: 46.1 % (ref 40.0–52.0)
HEMOGLOBIN: 15.9 g/dL (ref 13.0–18.0)
MCH: 31.4 pg (ref 26.0–34.0)
MCHC: 34.4 g/dL (ref 32.0–36.0)
MCV: 91.2 fL (ref 80.0–100.0)
PLATELETS: 219 10*3/uL (ref 150–440)
RBC: 5.06 MIL/uL (ref 4.40–5.90)
RDW: 12.7 % (ref 11.5–14.5)
WBC: 6.5 10*3/uL (ref 3.8–10.6)

## 2016-05-31 LAB — BASIC METABOLIC PANEL
Anion gap: 9 (ref 5–15)
BUN: 11 mg/dL (ref 6–20)
CHLORIDE: 103 mmol/L (ref 101–111)
CO2: 25 mmol/L (ref 22–32)
CREATININE: 0.71 mg/dL (ref 0.61–1.24)
Calcium: 9.4 mg/dL (ref 8.9–10.3)
Glucose, Bld: 107 mg/dL — ABNORMAL HIGH (ref 65–99)
POTASSIUM: 3.9 mmol/L (ref 3.5–5.1)
SODIUM: 137 mmol/L (ref 135–145)
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
## 2016-05-31 MED ORDER — SODIUM CHLORIDE 0.9 % IV BOLUS (SEPSIS)
1000.0000 mL | Freq: Once | INTRAVENOUS | Status: AC
  Administered 2016-05-31: 1000 mL via INTRAVENOUS

## 2016-05-31 MED ORDER — ONDANSETRON HCL 4 MG/2ML IJ SOLN
INTRAMUSCULAR | Status: AC
  Administered 2016-05-31: 4 mg via INTRAVENOUS
  Filled 2016-05-31: qty 2

## 2016-05-31 MED ORDER — HYDROMORPHONE HCL 1 MG/ML IJ SOLN
INTRAMUSCULAR | Status: AC
  Administered 2016-05-31: 1 mg via INTRAVENOUS
  Filled 2016-05-31: qty 1

## 2016-05-31 MED ORDER — HYDROMORPHONE HCL 1 MG/ML IJ SOLN
1.0000 mg | Freq: Once | INTRAMUSCULAR | Status: AC
  Administered 2016-05-31: 1 mg via INTRAVENOUS

## 2016-05-31 MED ORDER — ONDANSETRON HCL 4 MG/2ML IJ SOLN
4.0000 mg | Freq: Once | INTRAMUSCULAR | Status: AC
  Administered 2016-05-31: 4 mg via INTRAVENOUS

## 2016-05-31 NOTE — ED Notes (Signed)
Pt reports does not feel hoarse after having ginger ale.
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
## 2016-05-31 NOTE — ED Notes (Signed)
Pt left with [facility] transport 

## 2016-05-31 NOTE — ED Notes (Signed)
Pt c/o feeling like voice getting hoarse. Clearing throat while RN in room. Dr Kerman Passey notified and at bedside.

## 2016-05-31 NOTE — ED Notes (Signed)
Waiting on transport to [facility]
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
## 2016-05-31 NOTE — ED Notes (Signed)
Given gingerale per dr Kerman Passey

## 2016-06-01 DIAGNOSIS — T24221A Burn of second degree of right knee, initial encounter: Secondary | ICD-10-CM | POA: Insufficient documentation

## 2016-06-01 DIAGNOSIS — T2029XA Burn of second degree of multiple sites of head, face, and neck, initial encounter: Secondary | ICD-10-CM | POA: Insufficient documentation

## 2016-08-08 ENCOUNTER — Telehealth: Payer: Self-pay | Admitting: Family Medicine

## 2016-08-08 NOTE — Telephone Encounter (Signed)
Pt switched to Dr Lacinda Axon but does not need a appt at this time. Thank you!

## 2017-01-29 ENCOUNTER — Telehealth: Payer: Self-pay | Admitting: Family Medicine

## 2017-01-29 DIAGNOSIS — L509 Urticaria, unspecified: Secondary | ICD-10-CM | POA: Diagnosis not present

## 2017-01-29 DIAGNOSIS — R21 Rash and other nonspecific skin eruption: Secondary | ICD-10-CM | POA: Diagnosis not present

## 2017-01-29 NOTE — Telephone Encounter (Signed)
Patient stated he will go to [facility] today due to no appointments available today.

## 2017-01-29 NOTE — Telephone Encounter (Signed)
Pt called c/o rash all over body. He states that he was had this about 2 days, it gets worse in the sun and they are swollen. They are itchy, and he is also c/o fatigue. Pt does have a hx of a fib. Please advise, thank you!  Call pt @ [phone]

## 2017-03-08 ENCOUNTER — Emergency Department: Payer: Commercial Managed Care - PPO

## 2017-03-08 ENCOUNTER — Emergency Department
Admission: EM | Admit: 2017-03-08 | Discharge: 2017-03-08 | Disposition: A | Discharge status: Home / Self Care | Payer: Commercial Managed Care - PPO | Attending: Student in an Organized Health Care Education/Training Program | Admitting: Student in an Organized Health Care Education/Training Program

## 2017-03-08 ENCOUNTER — Encounter: Payer: Self-pay | Admitting: Emergency Medicine

## 2017-03-08 DIAGNOSIS — I10 Essential (primary) hypertension: Secondary | ICD-10-CM | POA: Insufficient documentation

## 2017-03-08 DIAGNOSIS — S3992XA Unspecified injury of lower back, initial encounter: Secondary | ICD-10-CM | POA: Diagnosis present

## 2017-03-08 DIAGNOSIS — S32020A Wedge compression fracture of second lumbar vertebra, initial encounter for closed fracture: Secondary | ICD-10-CM

## 2017-03-08 DIAGNOSIS — Z79899 Other long term (current) drug therapy: Secondary | ICD-10-CM | POA: Insufficient documentation

## 2017-03-08 DIAGNOSIS — Z7901 Long term (current) use of anticoagulants: Secondary | ICD-10-CM | POA: Insufficient documentation

## 2017-03-08 DIAGNOSIS — M545 Low back pain, unspecified: Secondary | ICD-10-CM

## 2017-03-08 DIAGNOSIS — I4891 Unspecified atrial fibrillation: Secondary | ICD-10-CM | POA: Insufficient documentation

## 2017-03-08 DIAGNOSIS — Y999 Unspecified external cause status: Secondary | ICD-10-CM | POA: Diagnosis not present

## 2017-03-08 DIAGNOSIS — X500XXA Overexertion from strenuous movement or load, initial encounter: Secondary | ICD-10-CM | POA: Diagnosis not present

## 2017-03-08 DIAGNOSIS — Y929 Unspecified place or not applicable: Secondary | ICD-10-CM | POA: Diagnosis not present

## 2017-03-08 DIAGNOSIS — Y9389 Activity, other specified: Secondary | ICD-10-CM | POA: Diagnosis not present
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
## 2017-03-08 MED ORDER — CYCLOBENZAPRINE HCL 5 MG PO TABS: 5.0000 mg | ORAL_TABLET | Freq: Three times a day (TID) | ORAL | 0 refills | Status: DC | PRN

## 2017-03-08 NOTE — ED Provider Notes (Signed)
[facility] Emergency Department Provider Note ____________________________________________  Time seen: 1153  I have reviewed the triage vital signs and the nursing notes.  HISTORY  Chief Complaint  Back Pain  HPI Jake Liu is a 56 y.o. male Presents to the ED for evaluation of acute low back pain with onset 2 days prior. The patient describes a lifting a large, heavy cooler, when he felt and heard a pop in his lower back.he denies any distal paresthesias, foot drop, or bladder bowel incontinence. He describes pain today is actually improved noting pain or across the lumbar sacral region. He describes driving from [facility] with his car seat teeters on, which allowed for normal range of motion of his back. He gives a remote history of previous back injury some 30 years prior. He describes the pain from that injury seemed much worse than that related to this current injury. He denies any other injury at this time and is without chest pain or shortness of breath.  Past Medical History:  Diagnosis Date  . Atrial fibrillation ([place])   . Cardiomyopathy ([place])    EF: 45%  . Sciatica     Patient Active Problem List   Diagnosis Date Noted  . Viral illness 03/24/2016  . Umbilical hernia 46/50/3546  . Hyperlipidemia 07/31/2014  . Obstructive sleep apnea 01/10/2014  . GERD (gastroesophageal reflux disease) 12/07/2013  . Cardiomyopathy ([place])   . Atrial fibrillation status post cardioversion ([place]) 01/12/2012  . Hypertension 01/12/2012  . Insomnia 01/12/2012    Past Surgical History:  Procedure Laterality Date  . KNEE SURGERY      Prior to Admission medications   Medication Sig Start Date End Date Taking? Authorizing Provider  atorvastatin (LIPITOR) 20 MG tablet Take 1 tablet (20 mg total) by mouth daily. 07/31/14   Jackolyn Confer, MD  cyclobenzaprine (FLEXERIL) 5 MG tablet Take 1 tablet (5 mg total) by mouth 3 (three) times daily as needed for muscle  spasms. 03/08/17   Tashana Haberl, Dannielle Karvonen, PA-C  metoprolol succinate (TOPROL-XL) 50 MG 24 hr tablet Take 1 tablet (50 mg total) by mouth daily. Take with or immediately following a meal. 01/14/13   Jackolyn Confer, MD  rivaroxaban (XARELTO) 20 MG TABS tablet Take by mouth. 04/01/13   [provider]    Allergies Bactrim [sulfamethoxazole-trimethoprim]; Lisinopril; and Sulfa antibiotics  Family History  Problem Relation Age of Onset  . Heart disease Mother 51  . Asthma Father     Social History Social History  Substance Use Topics  . Smoking status: Never Smoker  . Smokeless tobacco: Never Used  . Alcohol use 3.5 oz/week    7 drink(s) per week     Comment: drinks at night.  Drinks 10 cups coffee daily    Review of Systems  Constitutional: Negative for fever. Cardiovascular: Negative for chest pain. Respiratory: Negative for shortness of breath. Gastrointestinal: Negative for abdominal pain, vomiting and diarrhea. Genitourinary: Negative for dysuria. Musculoskeletal: Positive for back pain. Skin: Negative for rash. Neurological: Negative for headaches, focal weakness or numbness. ____________________________________________  PHYSICAL EXAM:  VITAL SIGNS: ED Triage Vitals  Enc Vitals Group     BP 03/08/17 1114 (!) 173/63     Pulse Rate 03/08/17 1114 71     Resp 03/08/17 1114 18     Temp 03/08/17 1114 98.1 F (36.7 C)     Temp Source 03/08/17 1114 Oral     SpO2 03/08/17 1114 99 %     Weight  03/08/17 1104 220 lb (99.8 kg)     Height 03/08/17 1104 6' (1.829 m)     Head Circumference --      Peak Flow --      Pain Score 03/08/17 1103 8     Pain Loc --      Pain Edu? --      Excl. in [place]? --     Constitutional: Alert and oriented. Well appearing and in no distress. Head: Normocephalic and atraumatic. Cardiovascular: Normal rate, regular rhythm. Normal distal pulses. Respiratory: Normal respiratory effort. No wheezes/rales/rhonchi. Gastrointestinal: Soft  and nontender. No distention. Musculoskeletal: normal spinal alignment without midline tenderness, spasm, deformity, or step-off. Patient transition from sit to stand without assistance. He is able to demonstrate normal lumbar flexion and extension range without difficulty. He is only mildly tender to palpation to the sacroiliac region on the right slightly greater than left. Negative seated straight leg raise bilaterally.Nontender with normal range of motion in all extremities.  Neurologic: cranial nerves II through XII grossly intact. Normal LE DTRs bilaterally.  Normal gait without ataxia. Normal speech and language. No gross focal neurologic deficits are appreciated. Skin:  Skin is warm, dry and intact. No rash noted. Psychiatric: Mood and affect are normal. Patient exhibits appropriate insight and judgment. ____________________________________________   RADIOLOGY  Lumbar Spine Complete  IMPRESSION: Acute superior endplate compression fracture of L2 vertebral body with approximately 25% anterior height loss.  Osseous demineralization.  I, Javonda Suh, Dannielle Karvonen, personally viewed and evaluated these images (plain radiographs) as part of my medical decision making, as well as reviewing the written report by the radiologist. ____________________________________________  INITIAL IMPRESSION / [place] / ED COURSE  Patient with ED evaluation of improved lower back pain on presentation. He exam is essentially normal. He has no acute neuromuscular deficits on exam. His lumbar films reveal a L2 compression fracture, but this is not consistent with his pain complaint. He is notified of the findngs, and a copy of his report is provided. He is discharged with a prescription for Flexeril to dose with Tylenol. He will follow-up with his provider or return as needed. ____________________________________________  FINAL CLINICAL IMPRESSION(S) / ED DIAGNOSES  Final diagnoses:  Acute  bilateral low back pain without sciatica  Closed compression fracture of second lumbar vertebra, initial encounter [facility])      Carmie End, Dannielle Karvonen, PA-C 03/08/17 1857    Merlyn Lot, MD 03/08/17 2100

## 2017-03-08 NOTE — ED Notes (Signed)
See triage note developed lower back pain after lifting something on Friday  Ambulates well states pain is non radiating

## 2017-03-08 NOTE — ED Triage Notes (Signed)
Pt was lifting cooler 2 days ago and felt like something popped in back. C/o lower back pain worse with ambulation. ambulatory to check in.  No loss bowel or bladder.

## 2017-03-08 NOTE — Discharge Instructions (Signed)
Your exam is essentially normal but your x-ray did reveal a compression fracture of your L2 vertebrae. It is not likely the cause of your current low back pain. It may have been from a previous injury. Your symptoms are consistent with a lumbar strain. Take the prescription muscle relaxant along with Extra Strength Tylenol for pain relief. Follow-up with Dr. Roland Rack or your provider as needed.

## 2017-03-13 DIAGNOSIS — M545 Low back pain: Secondary | ICD-10-CM | POA: Diagnosis not present

## 2017-03-13 DIAGNOSIS — S39012A Strain of muscle, fascia and tendon of lower back, initial encounter: Secondary | ICD-10-CM | POA: Diagnosis not present

## 2017-03-19 DIAGNOSIS — M5126 Other intervertebral disc displacement, lumbar region: Secondary | ICD-10-CM | POA: Diagnosis not present

## 2017-03-19 DIAGNOSIS — M545 Low back pain: Secondary | ICD-10-CM | POA: Diagnosis not present

## 2017-08-04 DIAGNOSIS — I340 Nonrheumatic mitral (valve) insufficiency: Secondary | ICD-10-CM | POA: Diagnosis not present

## 2017-08-04 DIAGNOSIS — Z8679 Personal history of other diseases of the circulatory system: Secondary | ICD-10-CM | POA: Diagnosis not present

## 2017-08-04 DIAGNOSIS — I481 Persistent atrial fibrillation: Secondary | ICD-10-CM | POA: Diagnosis not present

## 2017-08-04 DIAGNOSIS — Z9889 Other specified postprocedural states: Secondary | ICD-10-CM | POA: Diagnosis not present

## 2018-02-22 ENCOUNTER — Other Ambulatory Visit: Payer: Self-pay

## 2018-02-22 ENCOUNTER — Emergency Department: Payer: Commercial Managed Care - PPO

## 2018-02-22 ENCOUNTER — Emergency Department
Admission: EM | Admit: 2018-02-22 | Discharge: 2018-02-22 | Disposition: A | Discharge status: Home / Self Care | Payer: Commercial Managed Care - PPO | Attending: Emergency Medicine | Admitting: Emergency Medicine

## 2018-02-22 DIAGNOSIS — Z7901 Long term (current) use of anticoagulants: Secondary | ICD-10-CM | POA: Insufficient documentation

## 2018-02-22 DIAGNOSIS — B349 Viral infection, unspecified: Secondary | ICD-10-CM | POA: Insufficient documentation

## 2018-02-22 DIAGNOSIS — I10 Essential (primary) hypertension: Secondary | ICD-10-CM | POA: Insufficient documentation

## 2018-02-22 DIAGNOSIS — Z79899 Other long term (current) drug therapy: Secondary | ICD-10-CM | POA: Diagnosis not present

## 2018-02-22 DIAGNOSIS — R0981 Nasal congestion: Secondary | ICD-10-CM | POA: Diagnosis present

## 2018-02-22 DIAGNOSIS — J019 Acute sinusitis, unspecified: Secondary | ICD-10-CM | POA: Diagnosis not present

## 2018-02-22 LAB — BASIC METABOLIC PANEL
ANION GAP: 7 (ref 5–15)
BUN: 18 mg/dL (ref 6–20)
CALCIUM: 9.1 mg/dL (ref 8.9–10.3)
CHLORIDE: 103 mmol/L (ref 98–111)
CO2: 29 mmol/L (ref 22–32)
Creatinine, Ser: 0.69 mg/dL (ref 0.61–1.24)
GFR calc non Af Amer: >60 mL/min (ref >60)
GLUCOSE: 101 mg/dL — AB (ref 70–99)
POTASSIUM: 4 mmol/L (ref 3.5–5.1)
Sodium: 139 mmol/L (ref 135–145)

## 2018-02-22 LAB — CBC
HEMATOCRIT: 43.1 % (ref 40.0–52.0)
HEMOGLOBIN: 14.9 g/dL (ref 13.0–18.0)
MCH: 31.6 pg (ref 26.0–34.0)
MCHC: 34.7 g/dL (ref 32.0–36.0)
MCV: 91.3 fL (ref 80.0–100.0)
Platelets: 239 10*3/uL (ref 150–440)
RBC: 4.72 MIL/uL (ref 4.40–5.90)
RDW: 13.3 % (ref 11.5–14.5)
WBC: 6.5 10*3/uL (ref 3.8–10.6)

## 2018-02-22 LAB — TROPONIN I

## 2018-02-22 IMAGING — CR DG CHEST 2V
2 series · 2 of 2 positions shown · non-contrast
Comparison: Prior radiograph from [DATE].

CLINICAL DATA: Initial evaluation for acute chest pain.

EXAM:
CHEST - 2 VIEW

[chest pa]
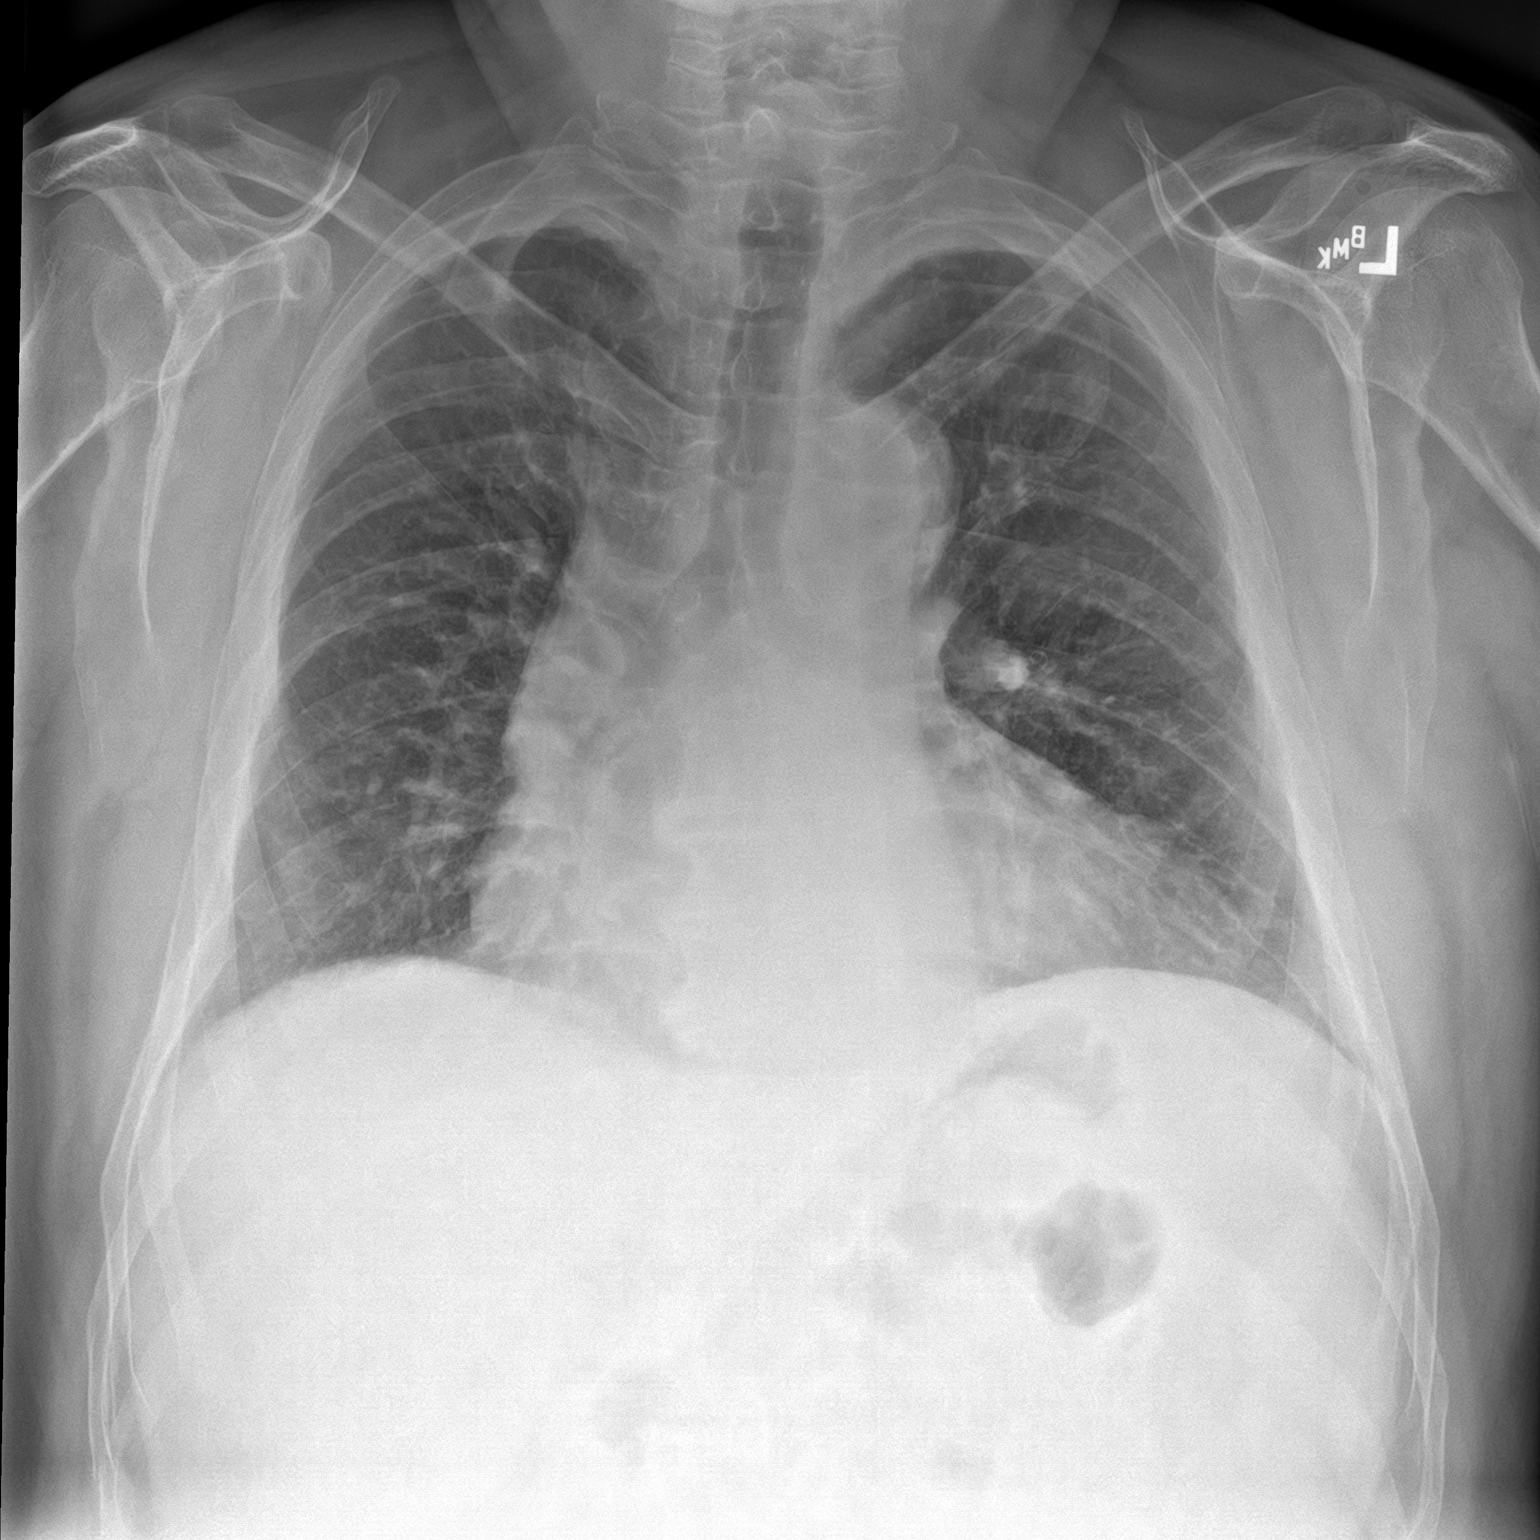

[chest lat]
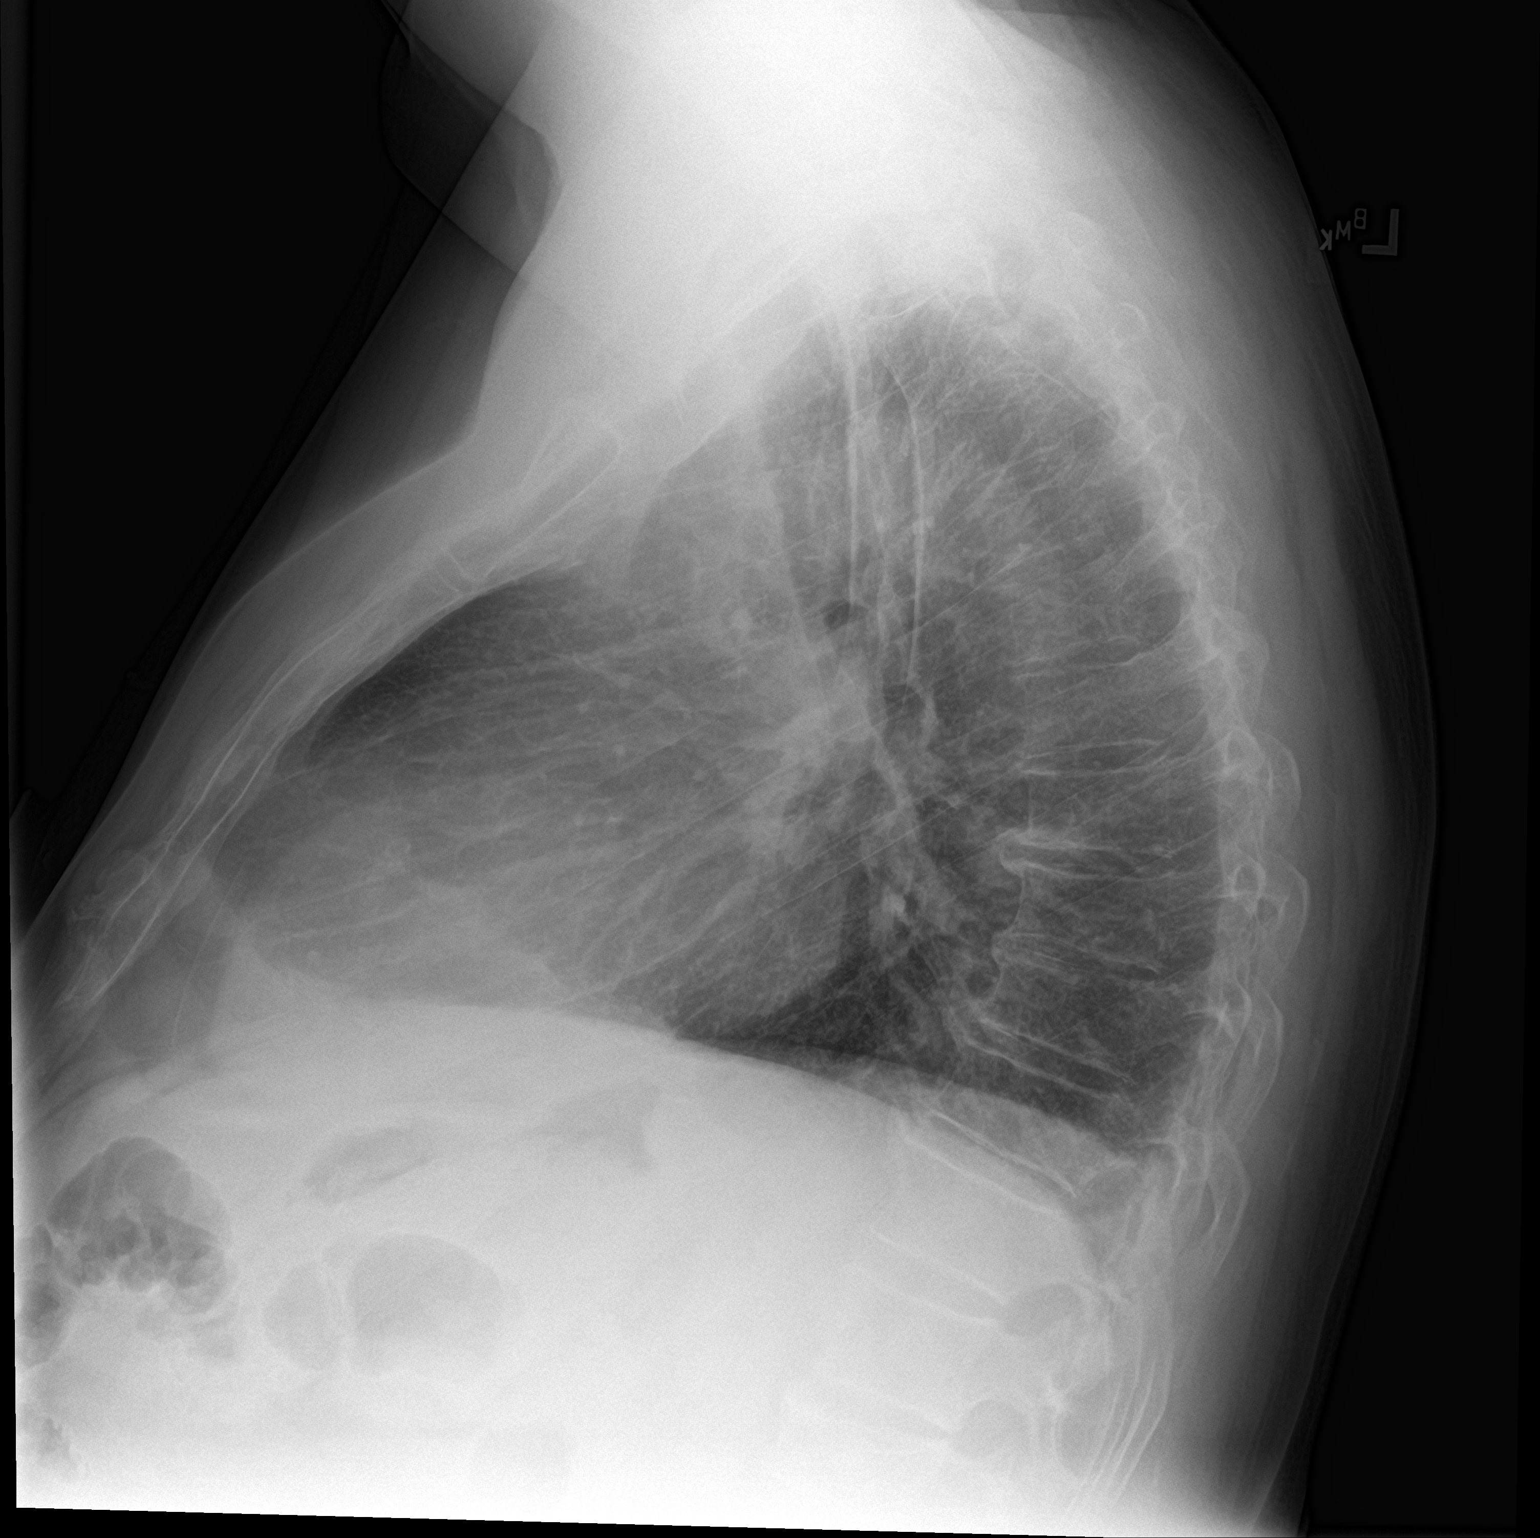

[2 of 2 positions shown; findings below may reference images not displayed]

FINDINGS: Moderate cardiomegaly, stable. Mediastinal silhouette within normal
limits.

Lungs are mildly hypoinflated. No focal infiltrates. No pulmonary
edema or pleural effusion. No pneumothorax.

No acute osseous abnormality.
IMPRESSION: 1. No active cardiopulmonary disease.
2. Stable cardiomegaly without edema.

## 2018-02-22 MED ORDER — AMLODIPINE BESYLATE 5 MG PO TABS: 5.0000 mg | ORAL_TABLET | Freq: Every day | ORAL | 1 refills | Status: DC

## 2018-02-22 MED ORDER — AMLODIPINE BESYLATE 5 MG PO TABS
5.0000 mg | ORAL_TABLET | Freq: Once | ORAL | Status: AC
  Administered 2018-02-22: 5 mg via ORAL
  Filled 2018-02-22: qty 1

## 2018-02-22 MED ORDER — OXYMETAZOLINE HCL 0.05 % NA SOLN
1.0000 | Freq: Once | NASAL | Status: AC
  Administered 2018-02-22: 1 via NASAL
  Filled 2018-02-22: qty 15

## 2018-02-22 NOTE — ED Provider Notes (Signed)
[facility] Emergency Department Provider Note  ____________________________________________  Time seen: Approximately 11:24 PM  I have reviewed the triage vital signs and the nursing notes.   HISTORY  Chief Complaint Chest Pain   HPI Jake Liu is a 57 y.o. male with a history of CHF, atrial fibrillation, hypertension and hyperlipidemia who presents for evaluation of flulike symptoms.  Patient reports 2 days of sinus congestion, nasal drainage, postnasal drip, generalized fatigue.  Triage note says patient is complaining of chest pain and shortness of breath however patient denied that.  He denies any chest pain or shortness of breath, no fever or chills.  He does endorse mild body aches. No sore throat, N/V/D, abdominal pain. He went to urgent care and he was found to have severely elevated blood pressure and therefore was sent here for evaluation.  Patient denies having a history of high blood pressure however is listed on his past medical history since 2013.  Patient is on Xarelto for atrial fibrillation.  He has remained in normal sinus rhythm since having an ablation 2 years ago.  He denies any palpitations.  Past Medical History:  Diagnosis Date  . Atrial fibrillation ([place])   . Cardiomyopathy ([place])    EF: 45%  . Sciatica     Patient Active Problem List   Diagnosis Date Noted  . Viral illness 03/24/2016  . Umbilical hernia 35/32/9924  . Hyperlipidemia 07/31/2014  . Obstructive sleep apnea 01/10/2014  . GERD (gastroesophageal reflux disease) 12/07/2013  . Cardiomyopathy ([place])   . Atrial fibrillation status post cardioversion ([place]) 01/12/2012  . Hypertension 01/12/2012  . Insomnia 01/12/2012    Past Surgical History:  Procedure Laterality Date  . KNEE SURGERY      Prior to Admission medications   Medication Sig Start Date End Date Taking? Authorizing Provider  amLODipine (NORVASC) 5 MG tablet Take 1 tablet (5 mg total) by mouth daily.  02/22/18 02/22/19  Rudene Re, MD  atorvastatin (LIPITOR) 20 MG tablet Take 1 tablet (20 mg total) by mouth daily. 07/31/14   Jackolyn Confer, MD  cyclobenzaprine (FLEXERIL) 5 MG tablet Take 1 tablet (5 mg total) by mouth 3 (three) times daily as needed for muscle spasms. 03/08/17   Menshew, Dannielle Karvonen, PA-C  metoprolol succinate (TOPROL-XL) 50 MG 24 hr tablet Take 1 tablet (50 mg total) by mouth daily. Take with or immediately following a meal. 01/14/13   Jackolyn Confer, MD  rivaroxaban (XARELTO) 20 MG TABS tablet Take by mouth. 04/01/13   [provider]    Allergies Bactrim [sulfamethoxazole-trimethoprim]; Lisinopril; and Sulfa antibiotics  Family History  Problem Relation Age of Onset  . Heart disease Mother 25  . Asthma Father     Social History Social History   Tobacco Use  . Smoking status: Never Smoker  . Smokeless tobacco: Never Used  Substance Use Topics  . Alcohol use: Yes    Alcohol/week: 4.2 oz    Types: 7 drink(s) per week    Comment: drinks at night.  Drinks 10 cups coffee daily  . Drug use: No    Review of Systems  Constitutional: Negative for fever. + fatigue Eyes: Negative for visual changes. ENT: Negative for sore throat. + Congestion, postnasal drip, sinus pressure Neck: No neck pain  Cardiovascular: Negative for chest pain. Respiratory: Negative for shortness of breath. Gastrointestinal: Negative for abdominal pain, vomiting or diarrhea. Genitourinary: Negative for dysuria. Musculoskeletal: Negative for back pain. Skin: Negative for rash. Neurological: Negative for  headaches, weakness or numbness. Psych: No SI or HI  ____________________________________________   PHYSICAL EXAM:  VITAL SIGNS: ED Triage Vitals [02/22/18 2050]  Enc Vitals Group     BP (!) 183/99     Pulse Rate 70     Resp 17     Temp 97.9 F (36.6 C)     Temp Source Oral     SpO2 96 %     Weight 235 lb (106.6 kg)     Height 6' (1.829 m)     Head  Circumference      Peak Flow      Pain Score 3     Pain Loc      Pain Edu?      Excl. in [place]?     Constitutional: Alert and oriented. Well appearing and in no apparent distress. HEENT:      Head: Normocephalic and atraumatic.         Eyes: Conjunctivae are normal. Sclera is non-icteric.       Mouth/Throat: Mucous membranes are moist.       Neck: Supple with no signs of meningismus. Cardiovascular: Regular rate and rhythm. No murmurs, gallops, or rubs. 2+ symmetrical distal pulses are present in all extremities. No JVD. Respiratory: Normal respiratory effort. Lungs are clear to auscultation bilaterally. No wheezes, crackles, or rhonchi.  Gastrointestinal: Soft, non tender, and non distended with positive bowel sounds. No rebound or guarding. Musculoskeletal: Nontender with normal range of motion in all extremities. No edema, cyanosis, or erythema of extremities. Neurologic: Normal speech and language. Face is symmetric. Moving all extremities. No gross focal neurologic deficits are appreciated. Skin: Skin is warm, dry and intact. No rash noted. Psychiatric: Mood and affect are normal. Speech and behavior are normal.  ____________________________________________   LABS (all labs ordered are listed, but only abnormal results are displayed)  Labs Reviewed  BASIC METABOLIC PANEL - Abnormal; Notable for the following components:      Result Value   Glucose, Bld 101 (*)    All other components within normal limits  CBC  TROPONIN I   ____________________________________________  EKG  ED ECG REPORT I, Rudene Re, the attending physician, personally viewed and interpreted this ECG.  Normal sinus rhythm, rate of 78, normal intervals, normal axis, LVH, no ST elevations or depressions.  Unchanged from prior from 2017 ____________________________________________  RADIOLOGY  I have personally reviewed the images performed during this visit and I agree with the Radiologist's  read.   Interpretation by Radiologist:  Dg Chest 2 View  Result Date: 02/22/2018 CLINICAL DATA:  Initial evaluation for acute chest pain. EXAM: CHEST - 2 VIEW COMPARISON:  Prior radiograph from 05/31/2016. FINDINGS: Moderate cardiomegaly, stable. Mediastinal silhouette within normal limits. Lungs are mildly hypoinflated. No focal infiltrates. No pulmonary edema or pleural effusion. No pneumothorax. No acute osseous abnormality. IMPRESSION: 1. No active cardiopulmonary disease. 2. Stable cardiomegaly without edema. Electronically Signed   By: Jeannine Boga M.D.   On: 02/22/2018 21:17     ____________________________________________   PROCEDURES  Procedure(s) performed: None Procedures Critical Care performed:  None ____________________________________________   INITIAL IMPRESSION / ASSESSMENT AND PLAN / ED COURSE   57 y.o. male with a history of CHF, atrial fibrillation, hypertension and hyperlipidemia who presents for evaluation of congestion, sinus pressure, postnasal drip, and fatigue for a few days.  Patient was sent here from urgent care for elevated blood pressure.  Review of epic shows the patient has had elevated blood pressure for 1.5 years.  He has not been on medications for it before.  Patient has no dizziness, no chest pain, no headache.  He has had no fever or chills.  No signs of sepsis.  Chest x-ray negative for pneumonia.  Labs are all within normal limits with normal troponin, normal CBC, no evidence of endorgan damage.  Since patient has had several elevated BP measurements including most recent one a few months ago by his cardiologist I will start patient on amlodipine for elevated blood pressure.  Recommend close follow-up with his primary care doctor or cardiologist for further management.  Discussed return precautions for signs of endorgan damage/stroke/chest pain.  For postnasal drip and sinus congestion I recommended Sudafed and Afrin.  No indication for  antibiotics.      As part of my medical decision making, I reviewed the following data within the [facility] notes reviewed and incorporated, Labs reviewed , EKG interpreted , Old EKG reviewed, Old chart reviewed, Radiograph reviewed , Notes from prior ED visits and  Controlled Substance Database    Pertinent labs & imaging results that were available during my care of the patient were reviewed by me and considered in my medical decision making (see chart for details).    ____________________________________________   FINAL CLINICAL IMPRESSION(S) / ED DIAGNOSES  Final diagnoses:  Acute sinusitis, recurrence not specified, unspecified location  Viral illness  Hypertension, unspecified type      NEW MEDICATIONS STARTED DURING THIS VISIT:  ED Discharge Orders        Ordered    amLODipine (NORVASC) 5 MG tablet  Daily     02/22/18 2331       Note:  This document was prepared using Dragon voice recognition software and may include unintentional dictation errors.    Alfred Levins, [place], MD 02/22/18 [phone]

## 2018-02-22 NOTE — ED Triage Notes (Signed)
Pt arrives to ED via POV from home with c/o chest pain and flu-like s/x's since last Friday. Pt reports returning from a business trip around [facility] before the s/x's started. Pt reports chest "tightness", fatigue, and SHOB. Pt states he went to Urgent Care PTA and sent here for evaluation. Pt denies N/V/D, no fever, no ABD pain. Pt has a h/x of A-fib, which is why Urgent Care sent him here.

## 2018-03-08 ENCOUNTER — Telehealth: Payer: Self-pay | Admitting: Family

## 2018-03-08 ENCOUNTER — Ambulatory Visit: Payer: Commercial Managed Care - PPO | Admitting: Family

## 2018-03-08 ENCOUNTER — Encounter: Payer: Self-pay | Admitting: Family

## 2018-03-08 VITALS — BP 120/85 | HR 83 | Temp 97.8°F | Resp 16 | Wt 232.5 lb

## 2018-03-08 DIAGNOSIS — I10 Essential (primary) hypertension: Secondary | ICD-10-CM

## 2018-03-08 DIAGNOSIS — I4891 Unspecified atrial fibrillation: Secondary | ICD-10-CM | POA: Diagnosis not present

## 2018-03-08 DIAGNOSIS — R079 Chest pain, unspecified: Secondary | ICD-10-CM

## 2018-03-08 DIAGNOSIS — Z0289 Encounter for other administrative examinations: Secondary | ICD-10-CM

## 2018-03-08 DIAGNOSIS — M545 Low back pain, unspecified: Secondary | ICD-10-CM | POA: Insufficient documentation

## 2018-03-08 DIAGNOSIS — F411 Generalized anxiety disorder: Secondary | ICD-10-CM | POA: Diagnosis not present

## 2018-03-08 DIAGNOSIS — N529 Male erectile dysfunction, unspecified: Secondary | ICD-10-CM | POA: Insufficient documentation

## 2018-03-08 MED ORDER — AMLODIPINE BESYLATE 5 MG PO TABS: 5.0000 mg | ORAL_TABLET | Freq: Every day | ORAL | 1 refills | Status: DC

## 2018-03-08 MED ORDER — SILDENAFIL CITRATE 100 MG PO TABS: 50.0000 mg | ORAL_TABLET | Freq: Every day | ORAL | 2 refills | Status: DC | PRN

## 2018-03-08 NOTE — Assessment & Plan Note (Signed)
Improved with the addition of amlodipine.  Will follow

## 2018-03-08 NOTE — Assessment & Plan Note (Signed)
Chronic. viagra given as needed.

## 2018-03-08 NOTE — Assessment & Plan Note (Signed)
Discussed at great length patient's stressful line of work, daily anxiety.  We also discussed at great length his drinking habits and family's concern.  I strongly encouraged him to cut back on his drinking for his overall health, quality of sleep, blood pressure control, weight management.  Patient very much understands this however he feels like it is culturally acceptable in his line of work.  We discussed today that we would discuss this at further at follow-up appointments.  I suspect stress is affecting his sleep and also contributory to his complaint of chest pain.  Suspect patient may be having underlying anxiety attacks based on when this symptom occurs.  See note under chest pain.  At follow-up, I plan to discuss an SSRI with patient such as Zoloft.  Will follow

## 2018-03-08 NOTE — Assessment & Plan Note (Deleted)
Declines EKG today. EKG 01/2018 reviewed. Chest pain has not changed since last EKG. Chest pain has been chronic. We discussed anxiety playing a role.Last stress test 5 years ago.  Close vigilance and referral to cardiology.

## 2018-03-08 NOTE — Assessment & Plan Note (Signed)
Asymptomatic.  Will follow.  

## 2018-03-08 NOTE — Telephone Encounter (Signed)
close

## 2018-03-08 NOTE — Assessment & Plan Note (Signed)
Benign abdominal exam.  Working diagnosis of chronic low back pain. Recent weight gain.   Advised patient to trial topical therapies at home including heat, salves such as icy hot.  Patient will also pay attention to when pain occurs.  We discussed at follow-up if this symptom is persistent discussing x-rays of low back.

## 2018-03-08 NOTE — Progress Notes (Signed)
Subjective:    Patient ID: Jake Liu, male    DOB: 1961-04-06, 57 y.o.   MRN: [id number]  CC: Jake Liu is a 57 y.o. male who presents today for follow up.   HPI: Atrial fib- diagnosed 8 years ago. On xarelto, metoprolol. S/p ablation 07/2015. Following with Dr [name]  HTN- had been on amlodipine. Has not been checking blood pressure.    Anxiety- stressful job. Notes aching pain on left side of chest, for years. Unchanged. 'nothing major.'  Notes when 'stressful things happen such as argument, conference call.' Has had multiple stress tests , normal per patient ( unable to see these). Last one 4-5 years ago. Denies exertional chest pain or pressure, numbness or tingling radiating to left arm or jaw, palpitations, dizziness, frequent headaches, changes in vision, or shortness of breath.   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 Notes a lot of competition at work. Doesn't sleep well. Feels very 'high strung' which is why drinks wine. Drinking bottle and half wine per night.  Feels the needs to cut down on drinking alcohol.  Children do feel annoyed by alcohol use. Denies feeling guilty for alcohol consumption or having an eye opener.    ED 10 days ago for sinus infection however blood pressure was very elevated. 189/110. Started on amlodipine 5mg  .   07/2017 - reviewed note from Dr Jake Liu  Compains of left bilateral side pain for 5, comes and goes. None today. Worse with long walking. No changes in bowel habits. No constipation. No pain in groin, numbness in legs.  H/o sciatica in the past.   No concerns with trouble with erection for years. Has been on viagra in the past, Would like it again today. This has not occurred while drinking. No concerns for STDs  ED 02/22/18.   Echo 09/2017  HISTORY:  Past Medical History:  Diagnosis Date  . Atrial fibrillation ([place])   . Cardiomyopathy ([place])    EF: 45%  . Sciatica    Past Surgical History:   Procedure Laterality Date  . KNEE SURGERY     Family History  Problem Relation Age of Onset  . Heart disease Mother 8  . Asthma Father     Allergies: Sulfa antibiotics; Sulfasalazine; Bactrim [sulfamethoxazole-trimethoprim]; and Lisinopril Current Outpatient Medications on File Prior to Visit  Medication Sig Dispense Refill  . metoprolol succinate (TOPROL-XL) 50 MG 24 hr tablet Take 1 tablet (50 mg total) by mouth daily. Take with or immediately following a meal. 30 tablet 3  . rivaroxaban (XARELTO) 20 MG TABS tablet Take by mouth.    Marland Kitchen atorvastatin (LIPITOR) 20 MG tablet Take 1 tablet (20 mg total) by mouth daily. (Patient not taking: Reported on 03/08/2018) 30 tablet 2   No current facility-administered medications on file prior to visit.     Social History   Tobacco Use  . Smoking status: Never Smoker  . Smokeless tobacco: Never Used  Substance Use Topics  . Alcohol use: Yes    Alcohol/week: 7.0 standard drinks    Types: 7 Standard drinks or equivalent per week    Comment: drinks at night., bottle and half of red wine;  Drinks 10 cups coffee daily  . Drug use: No    Review of Systems  Constitutional: Negative for chills and fever.  Respiratory: Negative for cough and shortness of breath.   Cardiovascular: Positive for chest pain. Negative for palpitations.  Gastrointestinal: Negative for nausea and vomiting.  Psychiatric/Behavioral: Positive for sleep disturbance. The patient is nervous/anxious.       Objective:    BP 120/85   Pulse 83   Temp 97.8 F (36.6 C) (Oral)   Resp 16   Wt 232 lb 8 oz (105.5 kg)   SpO2 97%   BMI 31.53 kg/m  BP Readings from Last 3 Encounters:  03/08/18 120/85  02/22/18 (!) 151/107  03/08/17 (!) 173/63   Wt Readings from Last 3 Encounters:  03/08/18 232 lb 8 oz (105.5 kg)  02/22/18 235 lb (106.6 kg)  03/08/17 220 lb (99.8 kg)    Physical Exam  Constitutional: He appears well-developed and well-nourished.  Cardiovascular:  Regular rhythm and normal heart sounds.  Pulmonary/Chest: Effort normal and breath sounds normal. No respiratory distress. He has no wheezes. He has no rhonchi. He has no rales.  Abdominal: Normal appearance and bowel sounds are normal. He exhibits no distension, no fluid wave and no mass. There is no tenderness. A hernia is present. Hernia confirmed positive in the ventral area.  Large reducible ventral hernia noted when patient was moving from upright to supine position. Nontender.   Musculoskeletal:       Lumbar back: He exhibits normal range of motion, no tenderness, no bony tenderness, no swelling, no pain and no spasm.  Full range of motion with flexion, extension, lateral side bends. No pain, numbness, tingling elicited with single leg raise bilaterally. No rash. Mild tenderness noted over SI joints biaterally.   Lymphadenopathy:       Head (left side): No submandibular and no preauricular adenopathy present.  Neurological: He is alert.  Skin: Skin is warm and dry.  Psychiatric: He has a normal mood and affect. His speech is normal and behavior is normal.  Vitals reviewed.      Assessment & Plan:   Problem List Items Addressed This Visit      Cardiovascular and Mediastinum   Atrial fibrillation status post cardioversion [facility])    Asymptomatic. Will follow.      Relevant Medications   sildenafil (VIAGRA) 100 MG tablet   amLODipine (NORVASC) 5 MG tablet   Hypertension    Improved with the addition of amlodipine.  Will follow      Relevant Medications   sildenafil (VIAGRA) 100 MG tablet   amLODipine (NORVASC) 5 MG tablet   Other Relevant Orders   Ambulatory referral to Cardiology     Genitourinary   Erectile dysfunction    Chronic. viagra given as needed.       Relevant Medications   sildenafil (VIAGRA) 100 MG tablet     Other   Chest pain    No CP today. Declines EKG today. EKG 01/2018 reviewed. No acute ischemia. LVH.   Chest pain has not changed since last EKG.  Chest pain has been chronic. We discussed anxiety playing a role.Last stress test 5 years ago.  Close vigilance and referral to cardiology.       GAD (generalized anxiety disorder) - Primary    Discussed at great length patient's stressful line of work, daily anxiety.  We also discussed at great length his drinking habits and family's concern.  I strongly encouraged him to cut back on his drinking for his overall health, quality of sleep, blood pressure control, weight management.  Patient very much understands this however he feels like it is culturally acceptable in his line of work.  We discussed today that we would discuss this at further at follow-up appointments.  I suspect  stress is affecting his sleep and also contributory to his complaint of chest pain.  Suspect patient may be having underlying anxiety attacks based on when this symptom occurs.  See note under chest pain.  At follow-up, I plan to discuss an SSRI with patient such as Zoloft.  Will follow      Bilateral low back pain without sciatica    Benign abdominal exam.  Working diagnosis of chronic low back pain. Recent weight gain.   Advised patient to trial topical therapies at home including heat, salves such as icy hot.  Patient will also pay attention to when pain occurs.  We discussed at follow-up if this symptom is persistent discussing x-rays of low back.          I have discontinued Treylen Jeanpaul's cyclobenzaprine. I am also having him start on sildenafil. Additionally, I am having him maintain his metoprolol succinate, rivaroxaban, atorvastatin, and amLODipine.   Meds ordered this encounter  Medications  . sildenafil (VIAGRA) 100 MG tablet    Sig: Take 0.5-1 tablets (50-100 mg total) by mouth daily as needed for erectile dysfunction.    Dispense:  5 tablet    Refill:  2    Order Specific Question:   Supervising Provider    Answer:   Derrel Nip, TERESA L [[id number]]  . amLODipine (NORVASC) 5 MG tablet    Sig: Take 1 tablet (5  mg total) by mouth daily.    Dispense:  90 tablet    Refill:  1    Order Specific Question:   Supervising Provider    Answer:   Crecencio Mc [[id number]]    Return precautions given.   Risks, benefits, and alternatives of the medications and treatment plan prescribed today were discussed, and patient expressed understanding.   Education regarding symptom management and diagnosis given to patient on AVS.  Continue to follow with Patient, No Pcp Per for routine health maintenance.   [name] and I agreed with plan.   [name], FNP

## 2018-03-08 NOTE — Assessment & Plan Note (Addendum)
No CP today. Declines EKG today. EKG 01/2018 reviewed. No acute ischemia. LVH.   Chest pain has not changed since last EKG. Chest pain has been chronic. We discussed anxiety playing a role.Last stress test 5 years ago.  Close vigilance and referral to cardiology.

## 2018-03-08 NOTE — Patient Instructions (Addendum)
Trial of heat, icy hot for suspected back pain.  Please pay attention to what make this pain come on, go away etc as we discussed.   Today we discussed referrals, orders. REFERRAL TO CARDIOLOGY   I have placed these orders in the system for you.  Please be sure to give [place] a call if you have not heard from our office regarding scheduling a test or regarding referral in a timely manner.  It is very important that you let me know as soon as possible.  Please stay very vigilant regarding chest pain. As discussed, it appears to be anxiety related. If recurs prior to seeing cardiology, please call 911 or go to closest ED.        Heart Attack A heart attack (myocardial infarction, MI) causes damage to the heart that cannot be fixed. A heart attack often happens when a blood clot or other blockage cuts blood flow to the heart. When this happens, certain areas of the heart begin to die. This causes the pain you feel during a heart attack. Follow these instructions at home:  Take medicine as told by your doctor. You may need medicine to: ? Keep your blood from clotting too easily. ? Control your blood pressure. ? Lower your cholesterol. ? Control abnormal heart rhythms.  Change certain behaviors as told by your doctor. This may include: ? Quitting smoking. ? Being active. ? Eating a heart-healthy diet. Ask your doctor for help with this diet. ? Keeping a healthy weight. ? Keeping your diabetes under control. ? Lessening stress. ? Limiting how much alcohol you drink. Do not take these medicines unless your doctor says that you can:  Nonsteroidal anti-inflammatory drugs (NSAIDs). These include: ? Ibuprofen. ? Naproxen. ? Celecoxib.  Vitamin supplements that have vitamin A, vitamin E, or both.  Hormone therapy that contains estrogen with or without progestin.  Get help right away if:  You have sudden chest discomfort.  You have sudden discomfort in  your: ? Arms. ? Back. ? Neck. ? Jaw.  You have shortness of breath at any time.  You have sudden sweating or clammy skin.  You feel sick to your stomach (nauseous) or throw up (vomit).  You suddenly get light-headed or dizzy.  You feel your heart beating fast or skipping beats. These symptoms may be an emergency. Do not wait to see if the symptoms will go away. Get medical help right away. Call your local emergency services (911 in the [place] Do not drive yourself to the hospital. This information is not intended to replace advice given to you by your health care provider. Make sure you discuss any questions you have with your health care provider. Document Released: 01/13/2012 Document Revised: 12/20/2015 Document Reviewed: 09/16/2013 Elsevier Interactive Patient Education  2017 [employer].

## 2018-03-22 ENCOUNTER — Encounter: Payer: Self-pay | Admitting: Family

## 2018-03-22 ENCOUNTER — Ambulatory Visit (INDEPENDENT_AMBULATORY_CARE_PROVIDER_SITE_OTHER): Payer: Commercial Managed Care - PPO | Admitting: Family

## 2018-03-22 ENCOUNTER — Telehealth: Payer: Self-pay | Admitting: Family

## 2018-03-22 VITALS — BP 124/80 | HR 87 | Temp 98.1°F | Resp 16 | Wt 229.8 lb

## 2018-03-22 DIAGNOSIS — K219 Gastro-esophageal reflux disease without esophagitis: Secondary | ICD-10-CM

## 2018-03-22 DIAGNOSIS — F411 Generalized anxiety disorder: Secondary | ICD-10-CM

## 2018-03-22 DIAGNOSIS — I10 Essential (primary) hypertension: Secondary | ICD-10-CM | POA: Diagnosis not present

## 2018-03-22 DIAGNOSIS — N529 Male erectile dysfunction, unspecified: Secondary | ICD-10-CM | POA: Diagnosis not present

## 2018-03-22 DIAGNOSIS — E785 Hyperlipidemia, unspecified: Secondary | ICD-10-CM | POA: Diagnosis not present

## 2018-03-22 MED ORDER — AMLODIPINE BESYLATE 5 MG PO TABS: 5.0000 mg | ORAL_TABLET | Freq: Every day | ORAL | 4 refills | Status: DC

## 2018-03-22 MED ORDER — SERTRALINE HCL 50 MG PO TABS: 50.0000 mg | ORAL_TABLET | Freq: Every day | ORAL | 0 refills | Status: DC

## 2018-03-22 MED ORDER — TADALAFIL 5 MG PO TABS: ORAL_TABLET | ORAL | 0 refills | Status: DC

## 2018-03-22 NOTE — Assessment & Plan Note (Signed)
Well-controlled.  Following up with [facility] regarding cardiology referral.  Patient again agrees to cardiology consult for risk stratification.

## 2018-03-22 NOTE — Assessment & Plan Note (Signed)
Symptoms most consistent with acid reflux.  Advised to trial over-the-counter Zantac.  Patient will let me know how he is doing.

## 2018-03-22 NOTE — Telephone Encounter (Signed)
-----   Message from Eustace Pen sent at 03/22/2018  1:38 PM EDT ----- They have lvm for him to return their call to schedule at [facility] in [place]. Thanks Melissa ----- Message ----- From: Burnard Hawthorne, FNP Sent: 03/22/2018  12:55 PM EDT To: Eustace Pen  Hi there,  Wanted to follow up on his cardiology referral?   Thank you!

## 2018-03-22 NOTE — Progress Notes (Signed)
Subjective:    Patient ID: Jake Liu, male    DOB: 02-18-1961, 57 y.o.   MRN: [id number]  CC: [name] is a 57 y.o. male who presents today for follow up.   HPI: Feels well today. No complaints.   Remains stressed at work and has trouble sleeping.   Never heard regarding cardiology. No chest pain since last OV. Mowed with push mower 1 acre without SOB, CP.   Noticed that abdominal pain occurs late at night after eating late and red wine ( typically half a bottle).   Also noted that it goes 'into hips at time' . Symptom has not changed. No pain today.   Erectile dysfunction- viagra for 5 pills cost $250. Would like to try another medication. Doesn't take nitrates.    HISTORY:  Past Medical History:  Diagnosis Date  . Atrial fibrillation ([place])   . Cardiomyopathy ([place])    EF: 45%  . Sciatica    Past Surgical History:  Procedure Laterality Date  . KNEE SURGERY     Family History  Problem Relation Age of Onset  . Heart disease Mother 23  . Asthma Father     Allergies: Sulfa antibiotics; Sulfasalazine; Bactrim [sulfamethoxazole-trimethoprim]; and Lisinopril Current Outpatient Medications on File Prior to Visit  Medication Sig Dispense Refill  . atorvastatin (LIPITOR) 20 MG tablet Take 1 tablet (20 mg total) by mouth daily. 30 tablet 2  . metoprolol succinate (TOPROL-XL) 50 MG 24 hr tablet Take 1 tablet (50 mg total) by mouth daily. Take with or immediately following a meal. 30 tablet 3  . rivaroxaban (XARELTO) 20 MG TABS tablet Take by mouth.     No current facility-administered medications on file prior to visit.     Social History   Tobacco Use  . Smoking status: Never Smoker  . Smokeless tobacco: Never Used  Substance Use Topics  . Alcohol use: Yes    Alcohol/week: 7.0 standard drinks    Types: 7 Standard drinks or equivalent per week    Comment: drinks at night., bottle and half of red wine;  Drinks 10 cups coffee daily  . Drug use: No    Review of  Systems  Constitutional: Negative for chills and fever.  Respiratory: Negative for cough.   Cardiovascular: Negative for chest pain and palpitations.  Gastrointestinal: Positive for abdominal pain (none today). Negative for abdominal distention, constipation, nausea and vomiting.  Psychiatric/Behavioral: Positive for sleep disturbance. The patient is nervous/anxious.       Objective:    BP 124/80 (BP Location: Left Arm, Patient Position: Sitting, Cuff Size: Large)   Pulse 87   Temp 98.1 F (36.7 C) (Oral)   Resp 16   Wt 229 lb 12 oz (104.2 kg)   SpO2 98%   BMI 31.16 kg/m  BP Readings from Last 3 Encounters:  03/22/18 124/80  03/08/18 120/85  02/22/18 (!) 151/107   Wt Readings from Last 3 Encounters:  03/22/18 229 lb 12 oz (104.2 kg)  03/08/18 232 lb 8 oz (105.5 kg)  02/22/18 235 lb (106.6 kg)    Physical Exam  Constitutional: He appears well-developed and well-nourished.  Cardiovascular: Regular rhythm and normal heart sounds.  Pulmonary/Chest: Effort normal and breath sounds normal. No respiratory distress. He has no wheezes. He has no rhonchi. He has no rales.  Lymphadenopathy:       Head (left side): No submandibular and no preauricular adenopathy present.  Neurological: He is alert.  Skin: Skin is warm and dry.  Psychiatric: He has a normal mood and affect. His speech is normal and behavior is normal.  Vitals reviewed.      Assessment & Plan:   Problem List Items Addressed This Visit      Cardiovascular and Mediastinum   Hypertension - Primary    Well-controlled.  Following up with [facility] regarding cardiology referral.  Patient again agrees to cardiology consult for risk stratification.      Relevant Medications   amLODipine (NORVASC) 5 MG tablet   tadalafil (CIALIS) 5 MG tablet     Digestive   GERD (gastroesophageal reflux disease)    Symptoms most consistent with acid reflux.  Advised to trial over-the-counter Zantac.  Patient will let me know how he  is doing.        Genitourinary   Erectile dysfunction    Trial cialis to see if insurance will cover.       Relevant Medications   tadalafil (CIALIS) 5 MG tablet     Other   Hyperlipidemia   Relevant Medications   amLODipine (NORVASC) 5 MG tablet   tadalafil (CIALIS) 5 MG tablet   Other Relevant Orders   Comprehensive metabolic panel   Lipid panel   PSA   CBC with Differential/Platelet   TSH   VITAMIN D 25 Hydroxy (Vit-D Deficiency, Fractures)   Hepatitis C antibody   GAD (generalized anxiety disorder)    Trial zoloft. Advised to cut back on alcohol use.       Relevant Medications   sertraline (ZOLOFT) 50 MG tablet       I have discontinued Hilding Gunkel's sildenafil. I am also having him start on sertraline and tadalafil. Additionally, I am having him maintain his metoprolol succinate, rivaroxaban, atorvastatin, and amLODipine.   Meds ordered this encounter  Medications  . amLODipine (NORVASC) 5 MG tablet    Sig: Take 1 tablet (5 mg total) by mouth daily.    Dispense:  90 tablet    Refill:  4    Order Specific Question:   Supervising Provider    Answer:   Deborra Medina L [[id number]]  . sertraline (ZOLOFT) 50 MG tablet    Sig: Take 1 tablet (50 mg total) by mouth at bedtime.    Dispense:  90 tablet    Refill:  0    Order Specific Question:   Supervising Provider    Answer:   Deborra Medina L [[id number]]  . tadalafil (CIALIS) 5 MG tablet    Sig: Take 5mg  PO once; Max: 20mg /dose up to 1dose/24h    Dispense:  10 tablet    Refill:  0    Order Specific Question:   Supervising Provider    Answer:   Crecencio Mc [[id number]]    Return precautions given.   Risks, benefits, and alternatives of the medications and treatment plan prescribed today were discussed, and patient expressed understanding.   Education regarding symptom management and diagnosis given to patient on AVS.  Continue to follow with Burnard Hawthorne, FNP for routine health maintenance.   [name]  and I agreed with plan.   [name], FNP

## 2018-03-22 NOTE — Assessment & Plan Note (Signed)
Trial zoloft. Advised to cut back on alcohol use.

## 2018-03-22 NOTE — Assessment & Plan Note (Signed)
Trial cialis to see if insurance will cover.

## 2018-03-22 NOTE — Patient Instructions (Addendum)
Lab appt when fasting; please make at front desk  Trial zantac twice per day , an hour before lunch and dinner.  Trial of zoloft   LIMIT alcohol as suspect this is worsening your anxiety, sleep  For FUTURE reference, NEVER take cialis or viagra with a nitrate. Please let cardiology know you are on this medication.

## 2018-03-22 NOTE — Telephone Encounter (Signed)
Call patient Apparently cardiology has been try to call him.   Please give him the following [phone] And ask him to call

## 2018-03-26 ENCOUNTER — Other Ambulatory Visit: Payer: Commercial Managed Care - PPO

## 2018-03-30 NOTE — Telephone Encounter (Signed)
Spoke with patient advised that cardiology had been trying to contact him. He requested that I call him back and leave voicemail with number . I called back and left voicemail with cardiology number.

## 2018-05-25 NOTE — Progress Notes (Signed)
Cardiology Office Note  Date:  05/26/2018   ID:  Jake Liu, DOB 10/18/1960, MRN [id number]  PCP:  Burnard Hawthorne, FNP   Chief Complaint  Patient presents with  . New Patient (Initial Visit)    HTN and A-FIB. Medications reviewed verbally.     HPI:  [name] is a 57 year old gentleman with past medical history of Atrial fibrillation, cardioversion June 2013, repeat July 2014, Normal ejection fraction on transesophageal echo July 2014 History of ablation, 2017 Smokes cigars Hypertension TIA Stress at work/anxiety, drinks wine Obstructive sleep apnea Referred by [name] for consultation of his hypertension, cardiac risk factors  Previously managed by [name] at [facility] for atrial fibrillation Reports he has done well since his ablation  Prior records reviewed with him Echo 09/2017, outside facility Normal LV function, no significant valvular disease Dilated aortic root, 3.9 cm  Carotid Doppler, normal 2014  Lots of travel, meeting "wine and dine" Buys oil, often on very stressful conference calls Does spin class, very occasionally, not as much as he would like Weight is up 30+ pounds over the past several years  No recent lipid panel available Reports having prior stress test, no problems One in our system from 2013, size Myoview showing no ischemia  Was previously on a statin, this was held  EKG personally reviewed by myself on todays visit Shows normal sinus rhythm rate 62 bpm no significant ST or T wave changes   PMH:   has a past medical history of Atrial fibrillation ([place]), Cardiomyopathy ([place]), and Sciatica.  PSH:    Past Surgical History:  Procedure Laterality Date  . KNEE SURGERY      Current Outpatient Medications  Medication Sig Dispense Refill  . amLODipine (NORVASC) 5 MG tablet Take 1 tablet (5 mg total) by mouth daily. 90 tablet 4  . metoprolol succinate (TOPROL-XL) 50 MG 24 hr tablet Take 1 tablet (50 mg total) by mouth  daily. Take with or immediately following a meal. 30 tablet 3  . rivaroxaban (XARELTO) 20 MG TABS tablet Take by mouth.    Marland Kitchen atorvastatin (LIPITOR) 20 MG tablet Take 1 tablet (20 mg total) by mouth daily. (Patient not taking: Reported on 05/26/2018) 30 tablet 2   No current facility-administered medications for this visit.     Allergies:   Sulfa antibiotics; Sulfasalazine; Bactrim [sulfamethoxazole-trimethoprim]; and Lisinopril   Social History:  The patient  reports that he has never smoked. He has never used smokeless tobacco. He reports that he drinks about 14.0 standard drinks of alcohol per week. He reports that he does not use drugs.   Family History:   family history includes Asthma in his father; Heart disease (age of onset: 3) in his mother.    Review of Systems: Review of Systems  Constitutional: Negative.   Respiratory: Negative.   Cardiovascular: Negative.   Gastrointestinal: Negative.   Musculoskeletal: Negative.   Neurological: Negative.   Psychiatric/Behavioral: Negative.   All other systems reviewed and are negative.    PHYSICAL EXAM: VS:  BP (!) 146/84 (BP Location: Right Arm, Patient Position: Sitting, Cuff Size: Normal)   Pulse 69   Ht 6' (1.829 m)   Wt 236 lb 8 oz (107.3 kg)   BMI 32.08 kg/m  , BMI Body mass index is 32.08 kg/m. GEN: Well nourished, well developed, in no acute distress , obese HEENT: normal  Neck: no JVD, carotid bruits, or masses Cardiac: RRR; no murmurs, rubs, or gallops,no edema  Respiratory:  clear  to auscultation bilaterally, normal work of breathing GI: soft, nontender, nondistended, + BS MS: no deformity or atrophy  Skin: warm and dry, no rash Neuro:  Strength and sensation are intact Psych: euthymic mood, full affect  Recent Labs: 02/22/2018: BUN 18; Creatinine, Ser 0.69; Hemoglobin 14.9; Platelets 239; Potassium 4.0; Sodium 139   Lipid Panel Lab Results  Component Value Date   CHOL 192 01/05/2015   HDL 51.00 01/05/2015    LDLCALC 125 (H) 01/05/2015   TRIG 82.0 01/05/2015      Wt Readings from Last 3 Encounters:  05/26/18 236 lb 8 oz (107.3 kg)  03/22/18 229 lb 12 oz (104.2 kg)  03/08/18 232 lb 8 oz (105.5 kg)      ASSESSMENT AND PLAN:  Atrial fibrillation status post cardioversion [facility]) Denies any recurrent symptoms Recommend he moderate his alcohol intake Also recommend he closely monitor blood pressure at home Discussed association between hypertension and atrial fibrillation  Essential hypertension He will monitor blood pressure at home, does not like taking medications in general Blood pressure borderline elevated today but is stressed, has to go to a meeting  Obstructive sleep apnea Documented in the notes, recommended weight loss  GAD (generalized anxiety disorder) Very stressful job Discussed stress relaxation techniques, deep breathing, getting away from work, exercise  Mixed hyperlipidemia He will have fasting lipid panel done through primary care then we can review his numbers Full risk stratification we will order CT coronary calcium scoring Discussed the study in detail, what the results would mean one way or another  Disposition:   F/U as needed   Total encounter time more than 60 minutes  Greater than 50% was spent in counseling and coordination of care with the patient Patient seen in consultation for [name] and will be referred back to her office for ongoing care of the issues detailed above  No orders of the defined types were placed in this encounter.    Signed, Esmond Plants, M.D., Ph.D. 05/26/2018  [place], [place]

## 2018-05-26 ENCOUNTER — Encounter: Payer: Self-pay | Admitting: Cardiovascular Disease

## 2018-05-26 ENCOUNTER — Ambulatory Visit: Payer: Commercial Managed Care - PPO | Admitting: Cardiovascular Disease

## 2018-05-26 VITALS — BP 146/84 | HR 69 | Ht 72.0 in | Wt 236.5 lb

## 2018-05-26 DIAGNOSIS — I10 Essential (primary) hypertension: Secondary | ICD-10-CM | POA: Diagnosis not present

## 2018-05-26 DIAGNOSIS — G4733 Obstructive sleep apnea (adult) (pediatric): Secondary | ICD-10-CM | POA: Diagnosis not present

## 2018-05-26 DIAGNOSIS — F411 Generalized anxiety disorder: Secondary | ICD-10-CM

## 2018-05-26 DIAGNOSIS — I4891 Unspecified atrial fibrillation: Secondary | ICD-10-CM | POA: Diagnosis not present

## 2018-05-26 DIAGNOSIS — E782 Mixed hyperlipidemia: Secondary | ICD-10-CM

## 2018-05-26 DIAGNOSIS — Z8249 Family history of ischemic heart disease and other diseases of the circulatory system: Secondary | ICD-10-CM

## 2018-05-26 NOTE — Addendum Note (Signed)
Addended by: Alba Destine on: 05/26/2018 03:24 PM   Modules accepted: Orders

## 2018-05-26 NOTE — Patient Instructions (Addendum)
Medication Instructions:  No changes  If you need a refill on your cardiac medications before your next appointment, please call your pharmacy.    Lab work: No new labs needed   If you have labs (blood work) drawn today and your tests are completely normal, you will receive your results only by: Marland Kitchen MyChart Message (if you have MyChart) OR . A paper copy in the mail If you have any lab test that is abnormal or we need to change your treatment, we will call you to review the results.   Testing/Procedures: We will order a CT coronary calcium score for family hx of CAD   Follow-Up: At [facility], you and your health needs are our priority.  As part of our continuing mission to provide you with exceptional heart care, we have created designated Provider Care Teams.  These Care Teams include your primary Cardiologist (physician) and Advanced Practice Providers (APPs -  Physician Assistants and Nurse Practitioners) who all work together to provide you with the care you need, when you need it.  . You will need a follow up appointment as needed .   Please call our office 2 months in advance to schedule this appointment.    . Providers on your designated Care Team:   . Murray Hodgkins, NP . Christell Faith, PA-C . Marrianne Mood, PA-C  Any Other Special Instructions Will Be Listed Below (If Applicable).  For educational health videos Log in to : [email or web address] Or : [email or web address], password : [email or web address]

## 2018-05-28 ENCOUNTER — Ambulatory Visit: Payer: Commercial Managed Care - PPO | Admitting: Family

## 2018-06-09 ENCOUNTER — Telehealth: Payer: Self-pay

## 2018-06-09 ENCOUNTER — Other Ambulatory Visit: Payer: Self-pay | Admitting: Family

## 2018-06-09 DIAGNOSIS — F411 Generalized anxiety disorder: Secondary | ICD-10-CM

## 2018-06-09 NOTE — Telephone Encounter (Signed)
I called patient to ask if he needed refill of zoloft that pharmacy requested. It was d/c off med list. He stated that he no longer needed prescription.

## 2018-06-11 ENCOUNTER — Inpatient Hospital Stay: Admission: RE | Admit: 2018-06-11 | Payer: Commercial Managed Care - PPO | Source: Ambulatory Visit

## 2018-08-10 DIAGNOSIS — Z7901 Long term (current) use of anticoagulants: Secondary | ICD-10-CM | POA: Insufficient documentation

## 2018-08-13 ENCOUNTER — Telehealth: Payer: Self-pay | Admitting: Radiology

## 2018-08-13 ENCOUNTER — Other Ambulatory Visit (INDEPENDENT_AMBULATORY_CARE_PROVIDER_SITE_OTHER): Payer: Commercial Managed Care - PPO

## 2018-08-13 DIAGNOSIS — E785 Hyperlipidemia, unspecified: Secondary | ICD-10-CM | POA: Diagnosis not present

## 2018-08-13 DIAGNOSIS — R300 Dysuria: Secondary | ICD-10-CM

## 2018-08-13 NOTE — Addendum Note (Signed)
Addended by: Arby Barrette on: 08/13/2018 02:47 PM   Modules accepted: Orders

## 2018-08-13 NOTE — Telephone Encounter (Signed)
I spoke with patient & he stated that he can urinate he just has a lot of pressure as well as urgency. He has some intermittent pain that's been going on the past month or so. He wanted to have his urine checked due to the urine frequency up to 5x per night & the pressure he feels. He said that if his belt is too tight or anything he is very uncomfortable. Patient confirmed he will be in on Wednesday & I told him we should have lab results beforehand.

## 2018-08-13 NOTE — Telephone Encounter (Signed)
Pt came in for labs today and stated he was having pain when urinating. Pt asked if he could leave urine specimen today since he has an appt Wednesday. Advised pt I would ask PCP but could not guarantee it would be ran without being seen.

## 2018-08-13 NOTE — Telephone Encounter (Signed)
Okay to collect urine  [name],  Call pt Let pt know. He needs to keep his appt. Triage pt. Ensure he can actually urinate. He may need to be seen over weekend

## 2018-08-13 NOTE — Addendum Note (Signed)
Addended by: Burnard Hawthorne on: 08/13/2018 03:49 PM   Modules accepted: Orders

## 2018-08-14 LAB — CBC WITH DIFFERENTIAL/PLATELET
Absolute Monocytes: 621 cells/uL (ref 200–950)
BASOS ABS: 52 {cells}/uL (ref 0–200)
Basophils Relative: 0.9 %
EOS PCT: 4 %
Eosinophils Absolute: 232 cells/uL (ref 15–500)
HEMATOCRIT: 45.3 % (ref 38.5–50.0)
Hemoglobin: 15.7 g/dL (ref 13.2–17.1)
LYMPHS ABS: 2187 {cells}/uL (ref 850–3900)
MCH: 30.7 pg (ref 27.0–33.0)
MCHC: 34.7 g/dL (ref 32.0–36.0)
MCV: 88.6 fL (ref 80.0–100.0)
MPV: 11.5 fL (ref 7.5–12.5)
Monocytes Relative: 10.7 %
NEUTROS PCT: 46.7 %
Neutro Abs: 2709 cells/uL (ref 1500–7800)
Platelets: 290 10*3/uL (ref 140–400)
RBC: 5.11 10*6/uL (ref 4.20–5.80)
RDW: 12.2 % (ref 11.0–15.0)
Total Lymphocyte: 37.7 %
WBC: 5.8 10*3/uL (ref 3.8–10.8)

## 2018-08-14 LAB — COMPREHENSIVE METABOLIC PANEL
AG Ratio: 1.3 (calc) (ref 1.0–2.5)
ALBUMIN MSPROF: 4.3 g/dL (ref 3.6–5.1)
ALKALINE PHOSPHATASE (APISO): 54 U/L (ref 40–115)
ALT: 22 U/L (ref 9–46)
AST: 20 U/L (ref 10–35)
BILIRUBIN TOTAL: 0.4 mg/dL (ref 0.2–1.2)
BUN: 10 mg/dL (ref 7–25)
CALCIUM: 10.1 mg/dL (ref 8.6–10.3)
CHLORIDE: 104 mmol/L (ref 98–110)
CO2: 25 mmol/L (ref 20–32)
Creat: 0.74 mg/dL (ref 0.70–1.33)
GLOBULIN: 3.2 g/dL (ref 1.9–3.7)
Glucose, Bld: 91 mg/dL (ref 65–99)
POTASSIUM: 4.7 mmol/L (ref 3.5–5.3)
Sodium: 141 mmol/L (ref 135–146)
Total Protein: 7.5 g/dL (ref 6.1–8.1)

## 2018-08-14 LAB — TSH: TSH: 1.93 mIU/L (ref 0.40–4.50)

## 2018-08-14 LAB — LIPID PANEL
CHOLESTEROL: 237 mg/dL — AB (ref <200)
HDL: 68 mg/dL (ref >40)
LDL Cholesterol (Calc): 155 mg/dL (calc) — ABNORMAL HIGH
Non-HDL Cholesterol (Calc): 169 mg/dL (calc) — ABNORMAL HIGH (ref <130)
Total CHOL/HDL Ratio: 3.5 (calc) (ref <5.0)
Triglycerides: 45 mg/dL (ref <150)

## 2018-08-14 LAB — VITAMIN D 25 HYDROXY (VIT D DEFICIENCY, FRACTURES): VIT D 25 HYDROXY: 20 ng/mL — AB (ref 30–100)

## 2018-08-16 NOTE — Telephone Encounter (Signed)
I called patient & he will be leaving a urine specimen tomorrow. Then he has an appointment on Wednesday.

## 2018-08-16 NOTE — Telephone Encounter (Signed)
Call pt My apologies as I did route note to appropriate person on Friday and his urine was not sent to lab.  Please apologize and offer appt with someone tomorrow or he can bring by urine today. If symptoms worse, he would need to go to urgent care today

## 2018-08-16 NOTE — Progress Notes (Signed)
Subjective:    Patient ID: Jake Liu, male    DOB: 1961/05/09, 58 y.o.   MRN: [id number]  CC: Jake Liu is a 58 y.o. male who presents today for follow up.   HPI: Sharp bilateral lower adbominal pain x 2 months, comes and goes.Today pain more localized to right lower quadrant.   Worse if bladder is full when voiding. No hesitancy. Not precipitated by meals.  Nauseated. No vomiting. Constipation, fever. No pain with bowel movement.   No new partners however would like to be tested.  No penile discharge  Drinking bottle and half of wine per night.   HLD- not on lipitor; not sure why not on the medication.   htn- compliant  Norvasc. No Cp.   GAD- never tried zoloft. No si/hi. Trouble sleeping and fatigue. No longer doing spin class.   Colonoscopy-  Utd; diverticulosis seen.  [place]- 04/2018.   HISTORY:  Past Medical History:  Diagnosis Date  . Atrial fibrillation ([place])   . Cardiomyopathy ([place])    EF: 45%  . Sciatica    Past Surgical History:  Procedure Laterality Date  . KNEE SURGERY     Family History  Problem Relation Age of Onset  . Heart disease Mother 45  . Asthma Father     Allergies: Sulfa antibiotics; Sulfasalazine; Bactrim [sulfamethoxazole-trimethoprim]; and Lisinopril Current Outpatient Medications on File Prior to Visit  Medication Sig Dispense Refill  . amLODipine (NORVASC) 5 MG tablet Take 1 tablet (5 mg total) by mouth daily. 90 tablet 4  . metoprolol succinate (TOPROL-XL) 50 MG 24 hr tablet Take 1 tablet (50 mg total) by mouth daily. Take with or immediately following a meal. 30 tablet 3  . rivaroxaban (XARELTO) 20 MG TABS tablet Take by mouth.     No current facility-administered medications on file prior to visit.     Social History   Tobacco Use  . Smoking status: Never Smoker  . Smokeless tobacco: Never Used  Substance Use Topics  . Alcohol use: Yes    Alcohol/week: 14.0 standard drinks    Types: 7 Standard drinks or equivalent, 7  Glasses of wine per week    Comment: drinks at night., bottle and half of red wine;  Drinks 10 cups coffee daily  . Drug use: No    Review of Systems  Constitutional: Negative for chills and fever.  Respiratory: Negative for cough.   Cardiovascular: Negative for chest pain and palpitations.  Gastrointestinal: Positive for abdominal pain and nausea. Negative for abdominal distention, constipation, diarrhea and vomiting.  Genitourinary: Positive for frequency (nocturia). Negative for difficulty urinating, discharge, dysuria, penile pain and testicular pain.  Psychiatric/Behavioral: Positive for sleep disturbance. Negative for suicidal ideas. The patient is nervous/anxious.       Objective:    BP 132/76 (BP Location: Left Arm, Patient Position: Sitting, Cuff Size: Large)   Pulse 71   Temp (!) 97.4 F (36.3 C)   Wt 236 lb (107 kg)   SpO2 96%   BMI 32.01 kg/m  BP Readings from Last 3 Encounters:  08/18/18 132/76  05/26/18 (!) 146/84  03/22/18 124/80   Wt Readings from Last 3 Encounters:  08/18/18 236 lb (107 kg)  05/26/18 236 lb 8 oz (107.3 kg)  03/22/18 229 lb 12 oz (104.2 kg)    Physical Exam Vitals signs reviewed.  Constitutional:      Appearance: Normal appearance. He is well-developed.  Cardiovascular:     Rate and Rhythm: Regular rhythm.  Heart sounds: Normal heart sounds.  Pulmonary:     Effort: Pulmonary effort is normal. No respiratory distress.     Breath sounds: Normal breath sounds. No wheezing or rales.  Abdominal:     General: Bowel sounds are normal. There is no distension.     Palpations: Abdomen is soft. Abdomen is not rigid. There is no mass.     Tenderness: There is abdominal tenderness in the suprapubic area. There is no right CVA tenderness, left CVA tenderness, guarding or rebound. Negative signs include Murphy's sign.       Comments: No suprapubic tenderness.  Obese, large ventral and umbilical hernia  Genitourinary:    Penis: Normal. No  tenderness, discharge or lesions.      Prostate: Enlarged. Not tender.     Rectum: Normal. No mass, tenderness or external hemorrhoid.     Comments: No masses or nodules appreciated during DRE lobes of prostate. Prostate symmetrically enlarged. Not boggy nor tender.   Skin:    General: Skin is warm and dry.  Neurological:     Mental Status: He is alert.  Psychiatric:        Speech: Speech normal.        Behavior: Behavior normal.        Assessment & Plan:   Problem List Items Addressed This Visit      Other   Hyperlipidemia    Advised to resume lipitor. rx sent.       Relevant Medications   atorvastatin (LIPITOR) 20 MG tablet   GAD (generalized anxiety disorder)    Declines medication at this time. Will continue to discuss.       Right lower quadrant abdominal pain    Pain appears to migrate. Today more suprapubic. Low clinical suspicion for prostatitis based on urine (  Lack of pyuria)  and lack of pain on prostate exam. Pending urology referral for pain with urination.  In the interim, pending CT abdomen and pelvis as considering atypical presentation of diverticulitis. Pending labs as well. Close vigilance advised to patient while we await results of all.       Relevant Orders   Lipase   Amylase   PSA   Hepatitis C antibody   RPR   HIV Antibody (routine testing w rflx)   Ambulatory referral to Urology   CT ABDOMEN PELVIS W CONTRAST   Urine cytology ancillary only([place])       I am having Jake Liu maintain his metoprolol succinate, rivaroxaban, amLODipine, and atorvastatin.   Meds ordered this encounter  Medications  . atorvastatin (LIPITOR) 20 MG tablet    Sig: Take 1 tablet (20 mg total) by mouth daily.    Dispense:  90 tablet    Refill:  2    Order Specific Question:   Supervising Provider    Answer:   Crecencio Mc [[id number]]    Return precautions given.   Risks, benefits, and alternatives of the medications and treatment plan prescribed  today were discussed, and patient expressed understanding.   Education regarding symptom management and diagnosis given to patient on AVS.  Continue to follow with Burnard Hawthorne, FNP for routine health maintenance.   Jake Liu and I agreed with plan.   [name], FNP

## 2018-08-17 ENCOUNTER — Other Ambulatory Visit (INDEPENDENT_AMBULATORY_CARE_PROVIDER_SITE_OTHER): Payer: Commercial Managed Care - PPO

## 2018-08-17 DIAGNOSIS — R300 Dysuria: Secondary | ICD-10-CM | POA: Diagnosis not present

## 2018-08-17 LAB — URINALYSIS, MICROSCOPIC ONLY
RBC / HPF: NONE SEEN (ref 0–?)
WBC, UA: NONE SEEN (ref 0–?)

## 2018-08-18 ENCOUNTER — Ambulatory Visit: Payer: Commercial Managed Care - PPO | Admitting: Family

## 2018-08-18 ENCOUNTER — Encounter: Payer: Self-pay | Admitting: Family

## 2018-08-18 ENCOUNTER — Ambulatory Visit
Admission: RE | Admit: 2018-08-18 | Discharge: 2018-08-18 | Disposition: A | Discharge status: Home / Self Care | Payer: Commercial Managed Care - PPO | Source: Ambulatory Visit | Attending: Family | Admitting: Family

## 2018-08-18 ENCOUNTER — Other Ambulatory Visit (HOSPITAL_COMMUNITY)
Admission: RE | Admit: 2018-08-18 | Discharge: 2018-08-18 | Disposition: A | Discharge status: Home / Self Care | Payer: Commercial Managed Care - PPO | Source: Ambulatory Visit | Attending: Family | Admitting: Family

## 2018-08-18 DIAGNOSIS — F411 Generalized anxiety disorder: Secondary | ICD-10-CM

## 2018-08-18 DIAGNOSIS — R1031 Right lower quadrant pain: Secondary | ICD-10-CM | POA: Insufficient documentation

## 2018-08-18 DIAGNOSIS — E785 Hyperlipidemia, unspecified: Secondary | ICD-10-CM | POA: Diagnosis not present

## 2018-08-18 LAB — URINE CULTURE
MICRO NUMBER:: 82960
SPECIMEN QUALITY:: ADEQUATE

## 2018-08-18 IMAGING — CT CT ABD-PELV W/ CM
2 of 5 series · 16 of 46 positions shown, 18 images · IV contrast (APPLIED)
Comparison: None.

CLINICAL DATA: Lower abdominal pain and dysuria for 2 months.
Suspected diverticulitis.

EXAM:
CT ABDOMEN AND PELVIS WITH CONTRAST
TECHNIQUE: Multidetector CT imaging of the abdomen and pelvis was performed
using the standard protocol following bolus administration of
intravenous contrast.
CONTRAST:  100mL OMNIPAQUE IOHEXOL 300 MG/ML  SOLN

[Series 5: coronal st · coronal · 0.86mm/px · 3 of 119 slices shown]
[im 40/119  soft-tissue]
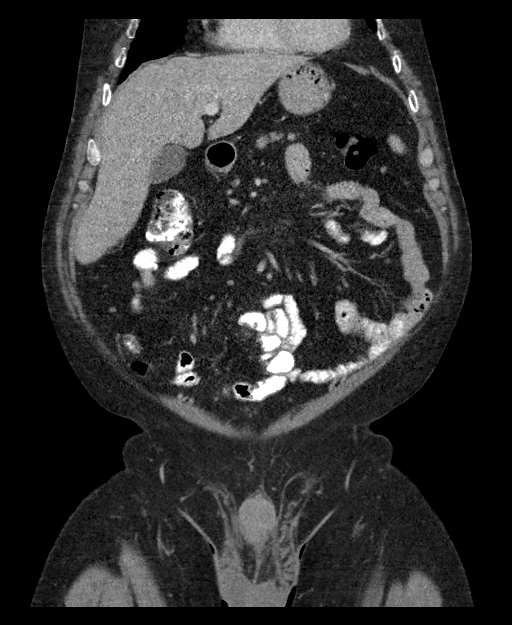
[im 53/119  soft-tissue]
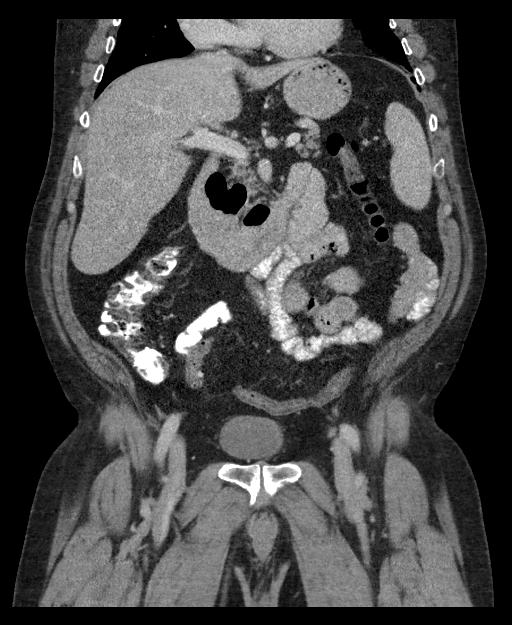
[im 66/119  soft-tissue]
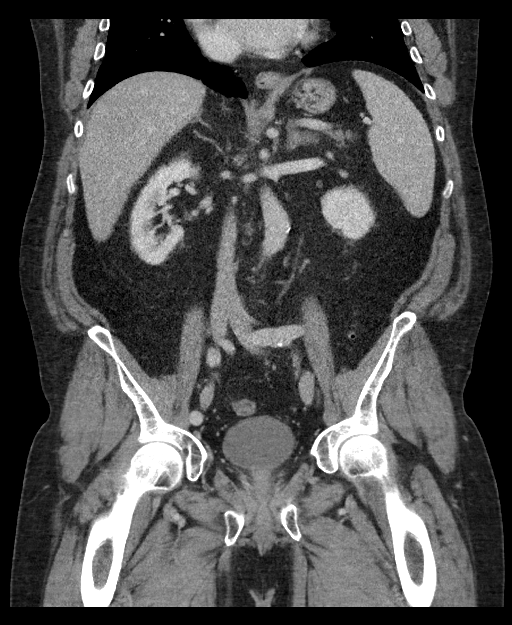

[Series 8: axial st 2 · axial · 0.76mm/px · z∈[-573,-123]mm · 13 of 102 slices shown, 15 images]
[im 6/102  soft-tissue]
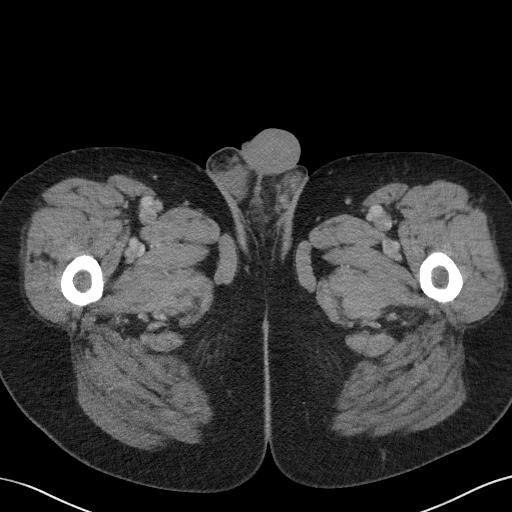
[im 6/102  bone]
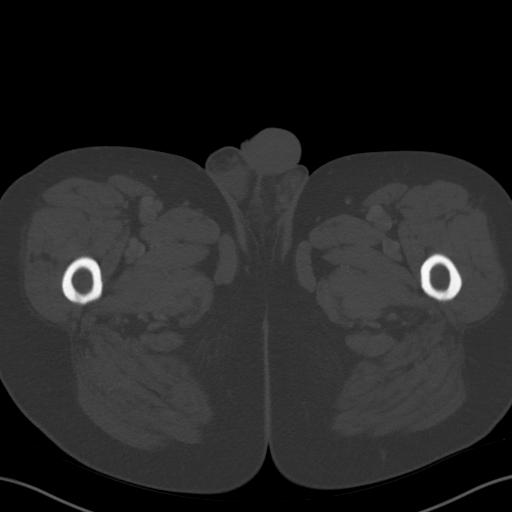
[im 12/102  soft-tissue]
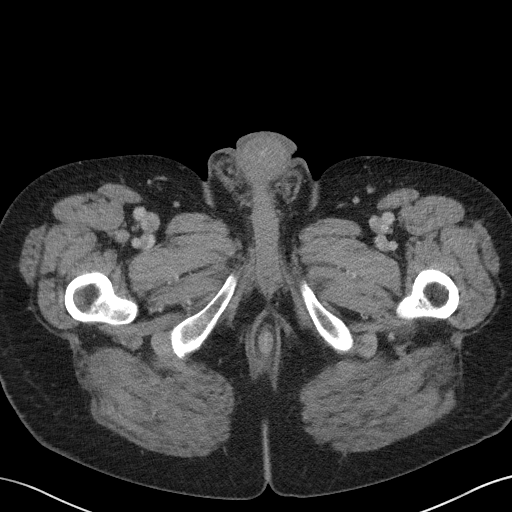
[im 23/102  soft-tissue]
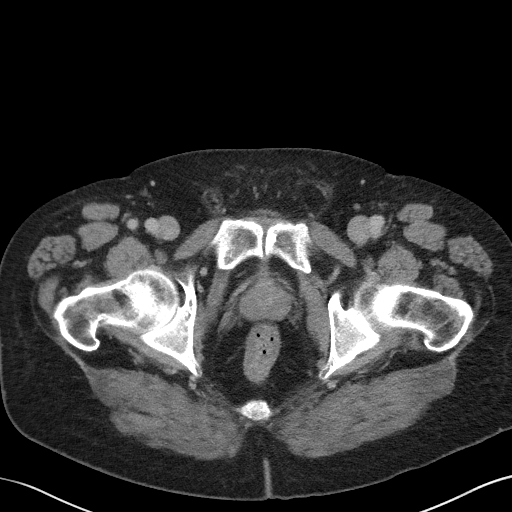
[im 29/102  soft-tissue]
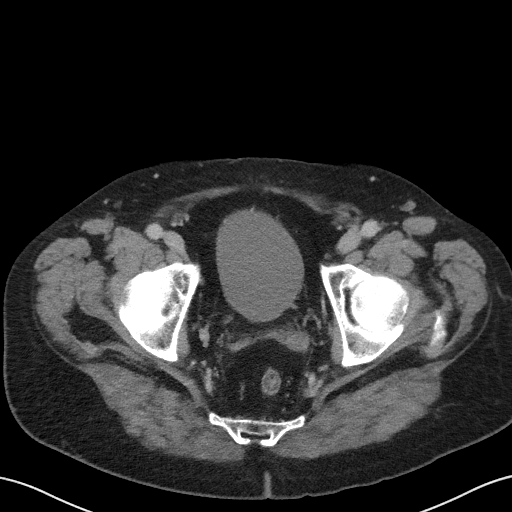
[im 34/102  soft-tissue]
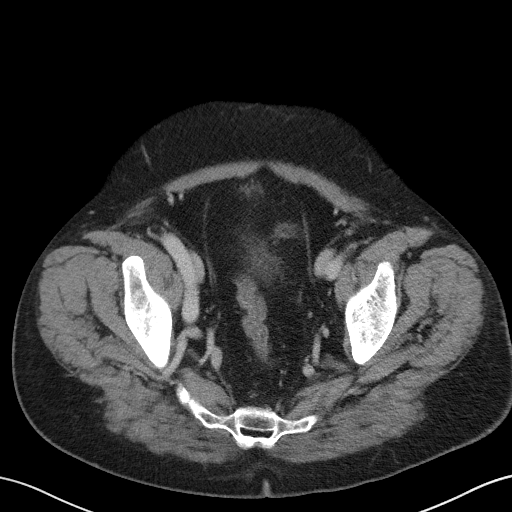
[im 45/102  soft-tissue]
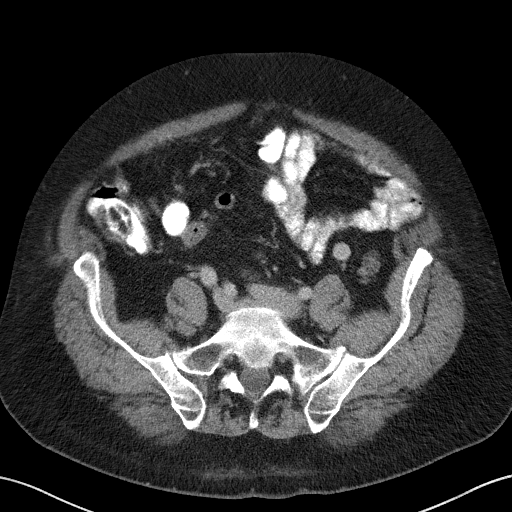
[im 51/102  soft-tissue]
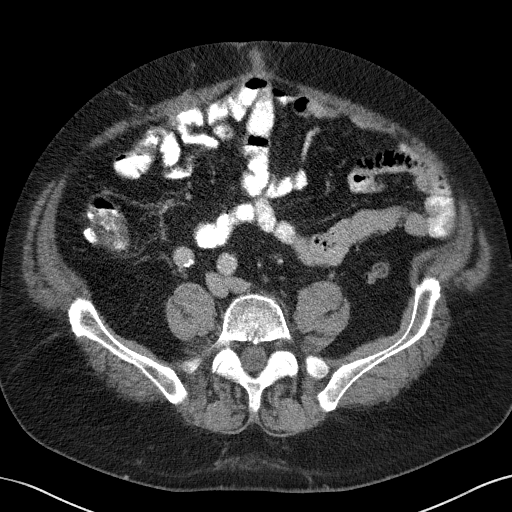
[im 57/102  soft-tissue]
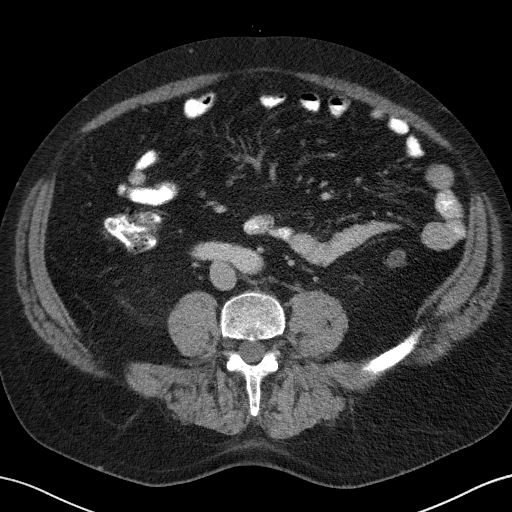
[im 68/102  soft-tissue]
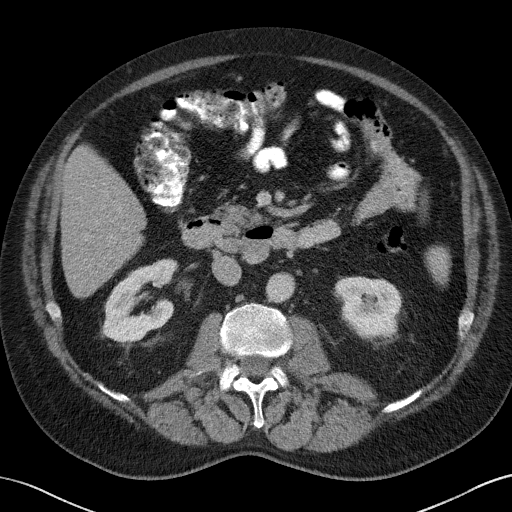
[im 68/102  bone]
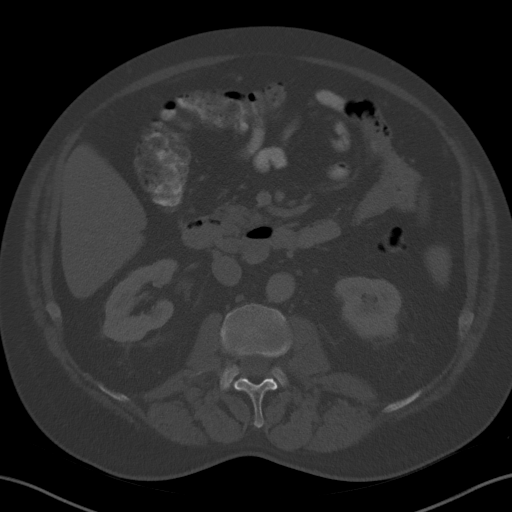
[im 73/102  soft-tissue]
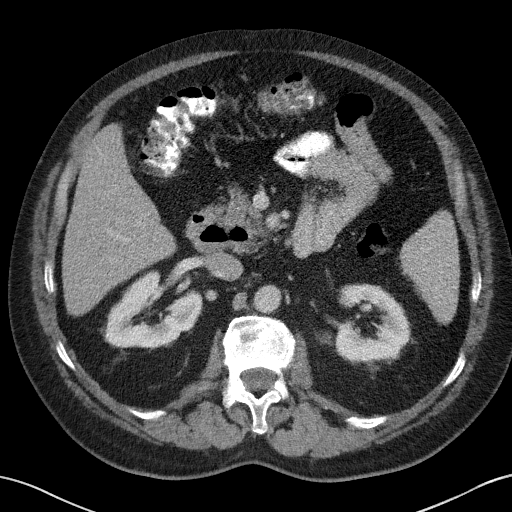
[im 79/102  soft-tissue]
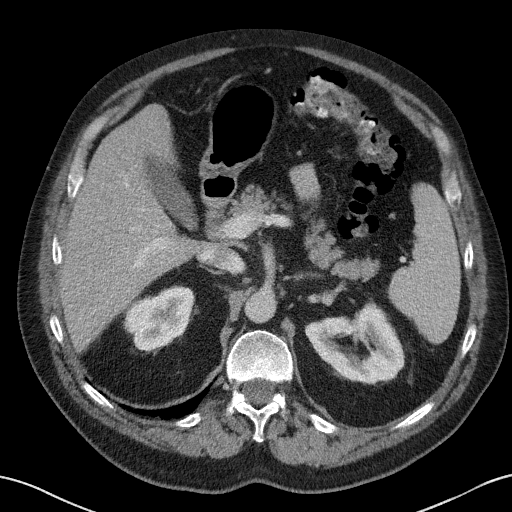
[im 90/102  soft-tissue]
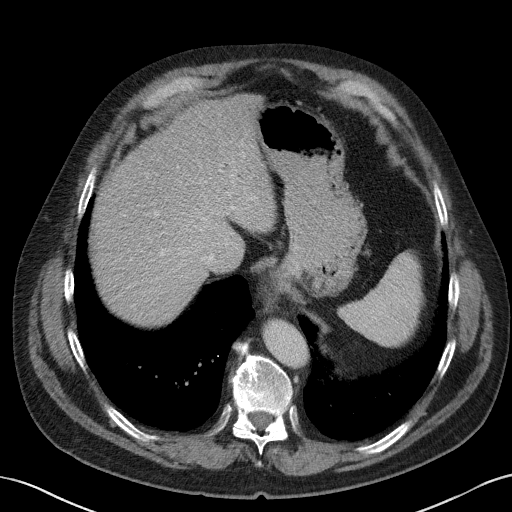
[im 96/102  soft-tissue]
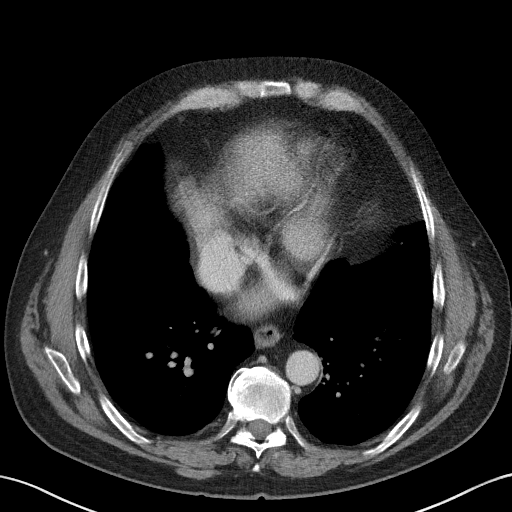

[16 of 46 positions shown; findings below may reference images not displayed]

FINDINGS: Lower Chest: No acute findings.

Hepatobiliary: No hepatic masses identified. Mild diffuse hepatic
steatosis. Gallbladder is unremarkable.

Pancreas:  No mass or inflammatory changes.

Spleen: Within normal limits in size and appearance.

Adrenals/Urinary Tract: No masses identified. No evidence of
hydronephrosis.

Stomach/Bowel: No evidence of obstruction, inflammatory process or
abnormal fluid collections. Diverticulosis is seen mainly involving
the sigmoid colon, however there is no evidence of diverticulitis.

Vascular/Lymphatic: No pathologically enlarged lymph nodes. No
abdominal aortic aneurysm. Aortic atherosclerosis.

Reproductive:  No mass or other significant abnormality.

Other:  None.

Musculoskeletal: No suspicious bone lesions identified. Old
compression fracture deformity of the L2 vertebral body noted.
IMPRESSION: 1. Colonic diverticulosis, without radiographic evidence of
diverticulitis or other acute findings.
2. Mild hepatic steatosis.

## 2018-08-18 MED ORDER — IOHEXOL 300 MG/ML  SOLN
100.0000 mL | Freq: Once | INTRAMUSCULAR | Status: AC | PRN
  Administered 2018-08-18: 100 mL via INTRAVENOUS

## 2018-08-18 MED ORDER — ATORVASTATIN CALCIUM 20 MG PO TABS: 20.0000 mg | ORAL_TABLET | Freq: Every day | ORAL | 2 refills | Status: DC

## 2018-08-18 NOTE — Assessment & Plan Note (Signed)
Advised to resume lipitor. rx sent.

## 2018-08-18 NOTE — Assessment & Plan Note (Addendum)
Pain appears to migrate. Today more suprapubic. Low clinical suspicion for prostatitis based on urine (  Lack of pyuria)  and lack of pain on prostate exam. Pending urology referral for pain with urination.  In the interim, pending CT abdomen and pelvis as considering atypical presentation of diverticulitis. Pending labs as well. Close vigilance advised to patient while we await results of all.

## 2018-08-18 NOTE — Assessment & Plan Note (Signed)
Declines medication at this time. Will continue to discuss.

## 2018-08-18 NOTE — Patient Instructions (Addendum)
Please start lipitor again.   Vitamin D is slightly low. Please start cholecalciferol 800 units daily for next 3 to 4 months. You may find this over the counter in the drug store. Please call to make an appointment for 3 to 4 months out so we can recheck. Also, please ensure you are following a diet high in calcium -- research shows better outcomes with dietary sources including kale, yogurt, broccolii, cheese, okra, almonds- to name a few.    Lets await on labs, Ct abdomen and pelvis. We will call tonight if anything critical  Today we discussed referrals, orders. urology   I have placed these orders in the system for you.  Please be sure to give [place] a call if you have not heard from our office regarding this. We should hear from [place] within ONE week with information regarding your appointment. If not, please let me know immediately.   Lets be in touch

## 2018-08-19 ENCOUNTER — Other Ambulatory Visit: Payer: Self-pay

## 2018-08-19 ENCOUNTER — Telehealth: Payer: Self-pay | Admitting: Family

## 2018-08-19 ENCOUNTER — Encounter: Payer: Self-pay | Admitting: Family

## 2018-08-19 DIAGNOSIS — K760 Fatty (change of) liver, not elsewhere classified: Secondary | ICD-10-CM | POA: Insufficient documentation

## 2018-08-19 DIAGNOSIS — E785 Hyperlipidemia, unspecified: Secondary | ICD-10-CM

## 2018-08-19 LAB — AMYLASE: Amylase: 44 U/L (ref 27–131)

## 2018-08-19 LAB — HEPATITIS C ANTIBODY
Hepatitis C Ab: NONREACTIVE
SIGNAL TO CUT-OFF: 0.02 (ref <1.00)

## 2018-08-19 LAB — LIPASE: Lipase: 5 U/L — ABNORMAL LOW (ref 11.0–59.0)

## 2018-08-19 LAB — RPR: RPR: NONREACTIVE

## 2018-08-19 LAB — HIV ANTIBODY (ROUTINE TESTING W REFLEX): HIV 1&2 Ab, 4th Generation: NONREACTIVE

## 2018-08-19 LAB — PSA: PSA: 0.33 ng/mL (ref 0.10–4.00)

## 2018-08-19 MED ORDER — ATORVASTATIN CALCIUM 20 MG PO TABS: 20.0000 mg | ORAL_TABLET | Freq: Every day | ORAL | 2 refills | Status: DC

## 2018-08-19 NOTE — Telephone Encounter (Signed)
Call patient Please inform him that in no acute findings were found on his CT abdomen and pelvis.  No diverticulitis however again diverticulosis was noted on the image. We will await further labs, however at this time I do not see any obvious reason for his pain.  Certainly if his symptoms worsen or new symptoms present, he needs to let us know  right away or go to the emergency room. He was also noted that he had fatty liver disease. Please advise that he make a follow-up appointment so we can discuss implications of fatty liver disease, education etc.  Some patients we do end up pursuing a liver ultrasound which is a dedicated image of the liver.  This is more of a reason that it is important that he picks up and starts the Lipitor and stays on this medication.  Most important, abstaining from alcohol can prevent further progression of fatty liver disease.  Oftentimes we do sending patient to gastroenterologist for consult, surveillance is fatty liver disease can progress to liver cancer, cirrhosis of the liver.  Again, please ensure his appointment with me so he can discuss this.

## 2018-08-20 ENCOUNTER — Ambulatory Visit: Payer: Commercial Managed Care - PPO | Admitting: Family

## 2018-08-20 ENCOUNTER — Other Ambulatory Visit (HOSPITAL_COMMUNITY)
Admission: RE | Admit: 2018-08-20 | Discharge: 2018-08-20 | Disposition: A | Discharge status: Home / Self Care | Payer: Commercial Managed Care - PPO | Source: Ambulatory Visit | Attending: Family | Admitting: Family

## 2018-08-20 ENCOUNTER — Encounter: Payer: Self-pay | Admitting: Family

## 2018-08-20 VITALS — BP 124/82 | HR 71 | Temp 97.9°F | Wt 236.8 lb

## 2018-08-20 DIAGNOSIS — R109 Unspecified abdominal pain: Secondary | ICD-10-CM | POA: Insufficient documentation

## 2018-08-20 DIAGNOSIS — Z23 Encounter for immunization: Secondary | ICD-10-CM | POA: Diagnosis not present

## 2018-08-20 DIAGNOSIS — I10 Essential (primary) hypertension: Secondary | ICD-10-CM | POA: Diagnosis not present

## 2018-08-20 DIAGNOSIS — G4733 Obstructive sleep apnea (adult) (pediatric): Secondary | ICD-10-CM

## 2018-08-20 DIAGNOSIS — K760 Fatty (change of) liver, not elsewhere classified: Secondary | ICD-10-CM | POA: Diagnosis not present

## 2018-08-20 LAB — URINE CYTOLOGY ANCILLARY ONLY
Chlamydia: NEGATIVE
Neisseria Gonorrhea: NEGATIVE

## 2018-08-20 NOTE — Assessment & Plan Note (Signed)
At goal today, no changes made to regimen.

## 2018-08-20 NOTE — Progress Notes (Signed)
Subjective:    Patient ID: Jake Liu, male    DOB: [birth year], 59 y.o.   MRN: [id number]  CC: Jake Liu is a 58 y.o. male who presents today for follow up.   HPI: Follow-up on results this week, also here to discuss fatty  liver disease.  He reports he stopped drinking all alcohol 2 days ago.  He states the flank pain that he was having on the sides has not recurred since.  The lower abdominal pressure with urination has also improved since abstaining from alcohol.  His epigastric burning is also not recurred.  He is not on medication for this.    He states earlier this week he was in [place] for work, admits to drinking "way more than he ever should been drinking.'  States drinking about a bottle and half of wine, a couple servings of scotch.    Fatty liver disease-mild steatosis seen CT abdomen pelvis this week.  Resumed lipitor again.   Has gained 30 lbs in past year. Used to go to [employer].   OSA- told borderline OSA.  Never treated with CPAP machine is chronically has trouble breathing through his nose.  Snores.  Endorses fatigue.  No headaches.     HISTORY:  Past Medical History:  Diagnosis Date  . Atrial fibrillation ([place])   . Cardiomyopathy ([place])    EF: 45%  . Sciatica    Past Surgical History:  Procedure Laterality Date  . KNEE SURGERY     Family History  Problem Relation Age of Onset  . Heart disease Mother 3  . Asthma Father     Allergies: Sulfa antibiotics; Sulfasalazine; Bactrim [sulfamethoxazole-trimethoprim]; and Lisinopril Current Outpatient Medications on File Prior to Visit  Medication Sig Dispense Refill  . amLODipine (NORVASC) 5 MG tablet Take 1 tablet (5 mg total) by mouth daily. 90 tablet 4  . atorvastatin (LIPITOR) 20 MG tablet Take 1 tablet (20 mg total) by mouth daily. 90 tablet 2  . metoprolol succinate (TOPROL-XL) 50 MG 24 hr tablet Take 1 tablet (50 mg total) by mouth daily. Take with or immediately following a meal. 30 tablet 3    . rivaroxaban (XARELTO) 20 MG TABS tablet Take by mouth.     No current facility-administered medications on file prior to visit.     Social History   Tobacco Use  . Smoking status: Never Smoker  . Smokeless tobacco: Never Used  Substance Use Topics  . Alcohol use: Yes    Alcohol/week: 14.0 standard drinks    Types: 7 Standard drinks or equivalent, 7 Glasses of wine per week    Comment: drinks at night., bottle and half of red wine;  Drinks 10 cups coffee daily  . Drug use: No    Review of Systems  Constitutional: Negative for chills and fever.  Respiratory: Negative for cough.   Cardiovascular: Negative for chest pain and palpitations.  Gastrointestinal: Negative for abdominal distention, abdominal pain (resolved), nausea and vomiting.      Objective:    BP 124/82 (BP Location: Left Arm, Patient Position: Sitting, Cuff Size: Large)   Pulse 71   Temp 97.9 F (36.6 C)   Wt 236 lb 12.8 oz (107.4 kg)   SpO2 96%   BMI 32.12 kg/m  BP Readings from Last 3 Encounters:  08/20/18 124/82  08/18/18 132/76  05/26/18 (!) 146/84   Wt Readings from Last 3 Encounters:  08/20/18 236 lb 12.8 oz (107.4 kg)  08/18/18 236 lb (107 kg)  05/26/18 236 lb 8 oz (107.3 kg)    Physical Exam Vitals signs reviewed.  Constitutional:      Appearance: He is well-developed.  Cardiovascular:     Rate and Rhythm: Regular rhythm.     Heart sounds: Normal heart sounds.  Pulmonary:     Effort: Pulmonary effort is normal. No respiratory distress.     Breath sounds: Normal breath sounds. No wheezing, rhonchi or rales.  Skin:    General: Skin is warm and dry.  Neurological:     Mental Status: He is alert.  Psychiatric:        Speech: Speech normal.        Behavior: Behavior normal.        Assessment & Plan:   Problem List Items Addressed This Visit      Cardiovascular and Mediastinum   Hypertension    At goal today, no changes made to regimen.        Respiratory   Obstructive  sleep apnea    Will discuss with dentist in terms of oral appliance.  Politely declines referral to ear nose and throat for difficulty breathing through nose.        Digestive   Fatty liver disease, nonalcoholic    Discussed fatty liver disease at length with patient today.  Discussed alcohol, treating his sleep apnea.  Advised he stay on his Lipitor.  Discussed weight loss at great length as well.  Discussed progression, surveillance thereof we jointly agreed that based on nonspecific flank pain that he can that he had earlier this week, in the setting of a normal CT abdomen and pelvis, labs, we agreed consult with gastroenterologist, Dr. Vira Agar appropriate.   In the interim as I explained to patient that I do not know etiology for his flank pain, although at this time it has not recurred since abstaining alcohol, he will stay hypervigilant and let me know any new or worsening symptoms.  He will also stay away from alcohol for now ( advised to do this long term).  Close follow-up        Other   Flank pain - Primary    Discussed all results with patient today.  Etiology for pain is unclear at this time.  He does not think it is musculoskeletal, low back. some consideration for interstitial cystitis.  Reassured by normal PSA, lipase, amylase, no acute findings on abdominal CT .  Again close vigilance advised.      Relevant Orders   Ambulatory referral to Gastroenterology   Urine cytology ancillary only([place])    Other Visit Diagnoses    Need for immunization against influenza       Relevant Orders   Flu Vaccine QUAD 36+ mos IM (Completed)       I am having [name] maintain his metoprolol succinate, rivaroxaban, amLODipine, and atorvastatin.   No orders of the defined types were placed in this encounter.   Return precautions given.   Risks, benefits, and alternatives of the medications and treatment plan prescribed today were discussed, and patient expressed  understanding.   Education regarding symptom management and diagnosis given to patient on AVS.  Continue to follow with Burnard Hawthorne, FNP for routine health maintenance.   [name] and I agreed with plan.   [name], FNP

## 2018-08-20 NOTE — Assessment & Plan Note (Signed)
Discussed all results with patient today.  Etiology for pain is unclear at this time.  He does not think it is musculoskeletal, low back. some consideration for interstitial cystitis.  Reassured by normal PSA, lipase, amylase, no acute findings on abdominal CT .  Again close vigilance advised.

## 2018-08-20 NOTE — Patient Instructions (Addendum)
Today we discussed referrals, orders. gastroenterelogy   I have placed these orders in the system for you.  Please be sure to give [place] a call if you have not heard from our office regarding this. We should hear from [place] within ONE week with information regarding your appointment. If not, please let me know immediately.    Please speak to dentist about oral appliance for sleep apnea.   Your ultrasound shows fatty infiltrate, most likely indicative non alcoholic fatty liver disease.   Lifestyle modifications are the treatment for this -  including low trans fat diet, maintaining a healthy weight as well as keeping cholesterol, blood pressure, and diabetes at goal.   Fatty liver disease is something that can progress to more worrisome conditions including cirrhosis, liver cancer.  I always recommend a GI consult for further evaluation, surveillance of disease process.  I have placed a referral for you to Dr Vira Agar.   Please let me of any new or worsening symptoms; we need to stay in touch in regards to your symptoms particularly since etiology is unknown.  Please abstain from alcohol.   Nonalcoholic Fatty Liver Disease Diet Introduction Nonalcoholic fatty liver disease is a condition that causes fat to accumulate in and around the liver. The disease makes it harder for the liver to work the way that it should. Following a healthy diet can help to keep nonalcoholic fatty liver disease under control. It can also help to prevent or improve conditions that are associated with the disease, such as heart disease, diabetes, high blood pressure, and abnormal cholesterol levels. Along with regular exercise, this diet:  Promotes weight loss.  Helps to control blood sugar levels.  Helps to improve the way that the body uses insulin. What do I need to know about this diet?  Use the glycemic index (GI) to plan your meals. The index tells you how quickly a food will raise your blood sugar. Choose low-GI  foods. These foods take a longer time to raise blood sugar.  Keep track of how many calories you take in. Eating the right amount of calories will help you to achieve a healthy weight.  You may want to follow a Mediterranean diet. This diet includes a lot of vegetables, lean meats or fish, whole grains, fruits, and healthy oils and fats. What foods can I eat? Grains  Whole grains, such as whole-wheat or whole-grain breads, crackers, tortillas, cereals, and pasta. Stone-ground whole wheat. Pumpernickel bread. Unsweetened oatmeal. Bulgur. Barley. Quinoa. Brown or wild rice. Corn or whole-wheat flour tortillas. Vegetables  Lettuce. Spinach. Peas. Beets. Cauliflower. Cabbage. Broccoli. Carrots. Tomatoes. Squash. Eggplant. Herbs. Peppers. Onions. Cucumbers. Brussels sprouts. Yams and sweet potatoes. Beans. Lentils. Fruits  Bananas. Apples. Oranges. Grapes. Papaya. Mango. Pomegranate. Kiwi. Grapefruit. Cherries. Meats and Other Protein Sources  Seafood and shellfish. Lean meats. Poultry. Tofu. Dairy  Low-fat or fat-free dairy products, such as yogurt, cottage cheese, and cheese. Beverages  Water. Sugar-free drinks. Tea. Coffee. Low-fat or skim milk. Milk alternatives, such as soy or almond milk. Real fruit juice. Condiments  Mustard. Relish. Low-fat, low-sugar ketchup and barbecue sauce. Low-fat or fat-free mayonnaise. Sweets and Desserts  Sugar-free sweets. Fats and Oils  Avocado. Canola or olive oil. Nuts and nut butters. Seeds. The items listed above may not be a complete list of recommended foods or beverages. Contact your dietitian for more options.  What foods are not recommended? Palm oil and coconut oil. Processed foods. Fried foods. Sweetened drinks, such as sweet tea, milkshakes,  snow cones, iced sweet drinks, and sodas. Alcohol. Sweets. Foods that contain a lot of salt or sodium. The items listed above may not be a complete list of foods and beverages to avoid. Contact your dietitian  for more information.  This information is not intended to replace advice given to you by your health care provider. Make sure you discuss any questions you have with your health care provider. Document Released: 11/28/2014 Document Revised: 12/20/2015 Document Reviewed: 08/08/2014  2017 Elsevier  Fatty Liver Introduction  Fatty liver, also called hepatic steatosis or steatohepatitis, is a condition in which too much fat has built up in your liver cells. The liver removes harmful substances from your bloodstream. It produces fluids your body needs. It also helps your body use and store energy from the food you eat. In many cases, fatty liver does not cause symptoms or problems. It is often diagnosed when tests are being done for other reasons. However, over time, fatty liver can cause inflammation that may lead to more serious liver problems, such as scarring of the liver (cirrhosis). What are the causes? Causes of fatty liver may include:  Drinking too much alcohol.  Poor nutrition.  Obesity.  Cushing syndrome.  Diabetes.  Hyperlipidemia.  Pregnancy.  Certain drugs.  Poisons.  Some viral infections. What increases the risk? You may be more likely to develop fatty liver if you:  Abuse alcohol.  Are pregnant.  Are overweight.  Have diabetes.  Have hepatitis.  Have a high triglyceride level. What are the signs or symptoms? Fatty liver often does not cause any symptoms. In cases where symptoms develop, they can include:  Fatigue.  Weakness.  Weight loss.  Confusion.  Abdominal pain.  Yellowing of your skin and the white parts of your eyes (jaundice).  Nausea and vomiting. How is this diagnosed? Fatty liver may be diagnosed by:  Physical exam and medical history.  Blood tests.  Imaging tests, such as an ultrasound, CT scan, or MRI.  Liver biopsy. A small sample of liver tissue is removed using a needle. The sample is then looked at under a  microscope. How is this treated? Fatty liver is often caused by other health conditions. Treatment for fatty liver may involve medicines and lifestyle changes to manage conditions such as:  Alcoholism.  High cholesterol.  Diabetes.  Being overweight or obese. Follow these instructions at home:  Eat a healthy diet as directed by your health care provider.  Exercise regularly. This can help you lose weight and control your cholesterol and diabetes. Talk to your health care provider about an exercise plan and which activities are best for you.  Do not drink alcohol.  Take medicines only as directed by your health care provider. Contact a health care provider if: You have difficulty controlling your:  Blood sugar.  Cholesterol.  Alcohol consumption. Get help right away if:  You have abdominal pain.  You have jaundice.  You have nausea and vomiting. This information is not intended to replace advice given to you by your health care provider. Make sure you discuss any questions you have with your health care provider.

## 2018-08-20 NOTE — Assessment & Plan Note (Signed)
Discussed fatty liver disease at length with patient today.  Discussed alcohol, treating his sleep apnea.  Advised he stay on his Lipitor.  Discussed weight loss at great length as well.  Discussed progression, surveillance thereof we jointly agreed that based on nonspecific flank pain that he can that he had earlier this week, in the setting of a normal CT abdomen and pelvis, labs, we agreed consult with gastroenterologist, Dr. Vira Agar appropriate.   In the interim as I explained to patient that I do not know etiology for his flank pain, although at this time it has not recurred since abstaining alcohol, he will stay hypervigilant and let me know any new or worsening symptoms.  He will also stay away from alcohol for now ( advised to do this long term).  Close follow-up

## 2018-08-20 NOTE — Assessment & Plan Note (Signed)
Will discuss with dentist in terms of oral appliance.  Politely declines referral to ear nose and throat for difficulty breathing through nose.

## 2018-08-23 NOTE — Progress Notes (Signed)
I spoke with patient & she stated that he did feel some better. He said he still did have flank pain from time to time, but not as bad. He has refrained from alcohol for 6 days as well. He felt the pain some this morning after drinking coffee & I advised maybe try decaf coffee for a day or so, but he stated that would be very difficult. He said that he would call back if he did not hear about appointment to see gastro & I told him I would call back when we got results from his urine on Friday.

## 2018-08-24 LAB — URINE CYTOLOGY ANCILLARY ONLY
Chlamydia: NEGATIVE
Neisseria Gonorrhea: NEGATIVE
Trichomonas: NEGATIVE

## 2018-08-31 ENCOUNTER — Ambulatory Visit (INDEPENDENT_AMBULATORY_CARE_PROVIDER_SITE_OTHER)
Admission: RE | Admit: 2018-08-31 | Discharge: 2018-08-31 | Disposition: A | Discharge status: Home / Self Care | Payer: Self-pay | Source: Ambulatory Visit | Attending: Cardiovascular Disease | Admitting: Cardiovascular Disease

## 2018-08-31 ENCOUNTER — Telehealth: Payer: Self-pay | Admitting: Cardiovascular Disease

## 2018-08-31 DIAGNOSIS — Z8249 Family history of ischemic heart disease and other diseases of the circulatory system: Secondary | ICD-10-CM

## 2018-08-31 IMAGING — CT CT HEART SCORING
2 series · 16 of 20 positions shown, 18 images · non-contrast
Comparison: None.

Addendum:
EXAM:
OVER-READ INTERPRETATION  CT CHEST

The following report is an over-read performed by radiologist Dr.
HASSANO [REDACTED] on [DATE]. This over-read
does not include interpretation of cardiac or coronary anatomy or
pathology. The coronary calcium score interpretation by the
cardiologist is attached.
CLINICAL DATA: Risk stratification
Coronary Calcium Score
TECHNIQUE: The patient was scanned on a Siemens Force scanner. Axial
non-contrast 3 mm slices were carried out through the heart. The
data set was analyzed on a dedicated work station and scored using
the Agatson method.

[Series 3: casc 3.0 i36f 2 bestdiast 71 % · axial · 0.38mm/px · z∈[+1211,+1310]mm · 8 of 43 slices shown, 10 images]
[im 5/43  vessel]
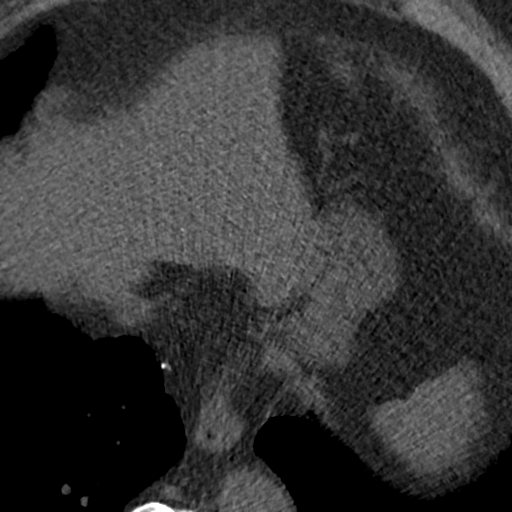
[im 5/43  lung]
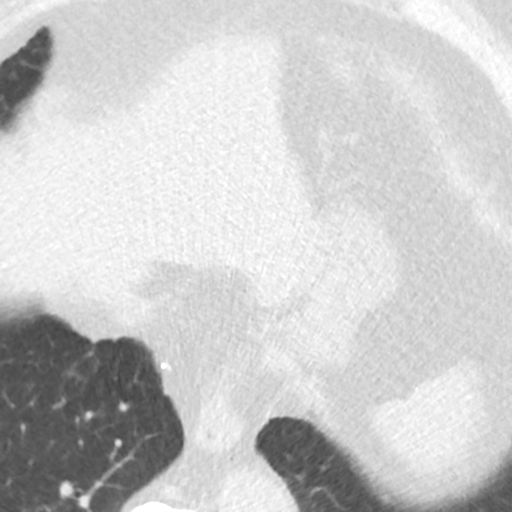
[im 10/43  vessel]
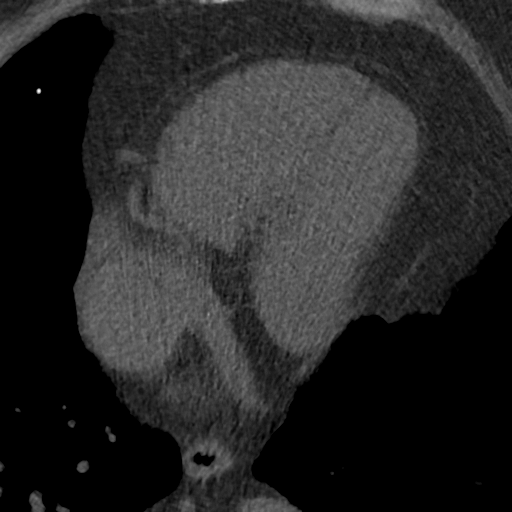
[im 15/43  vessel]
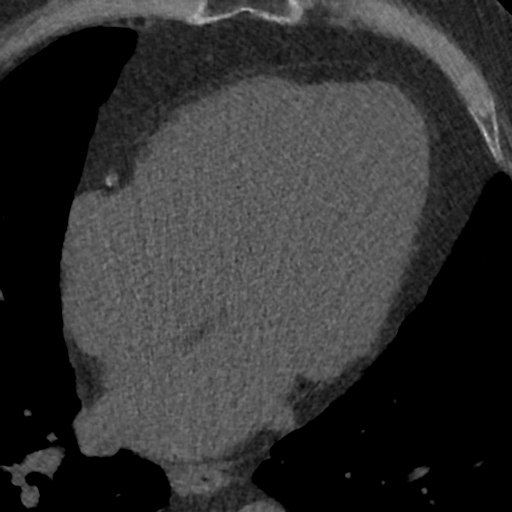
[im 19/43  vessel]
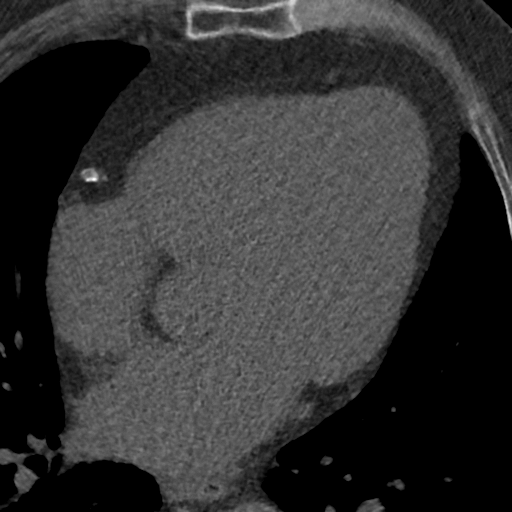
[im 24/43  vessel]
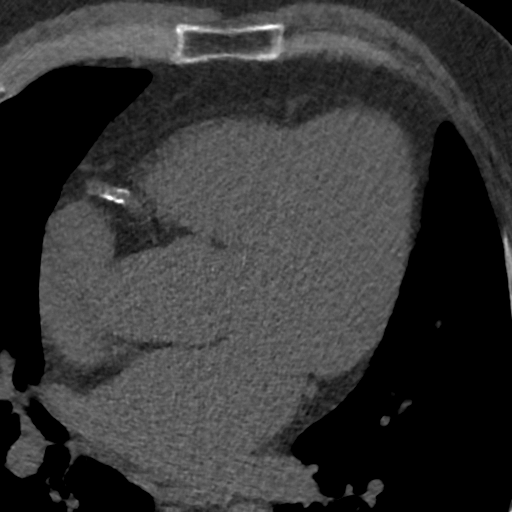
[im 24/43  lung]
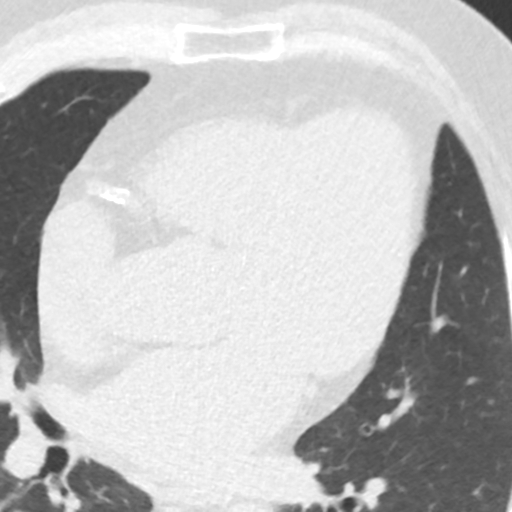
[im 29/43  vessel]
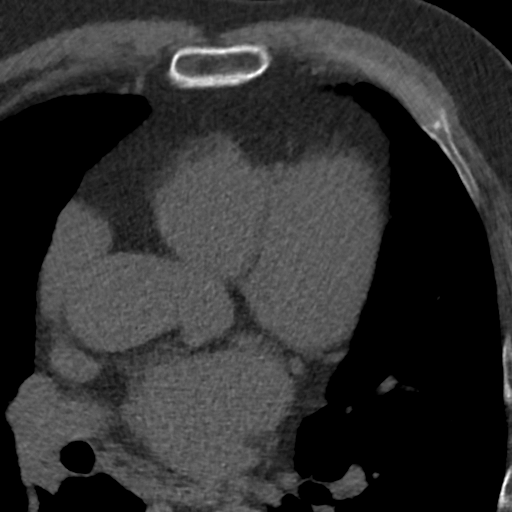
[im 33/43  vessel]
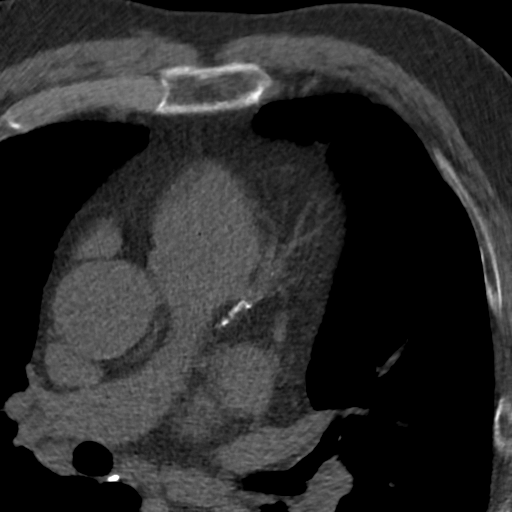
[im 38/43  vessel]
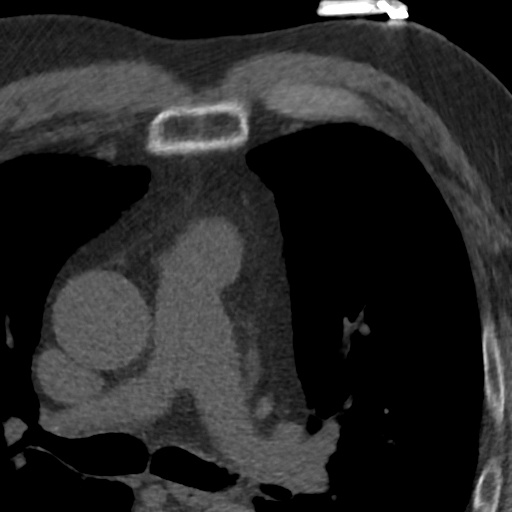

[Series 5: lung st 71 % · axial · 0.64mm/px · z∈[+1212,+1310]mm · 8 of 43 slices shown]
[im 5/43  lung]
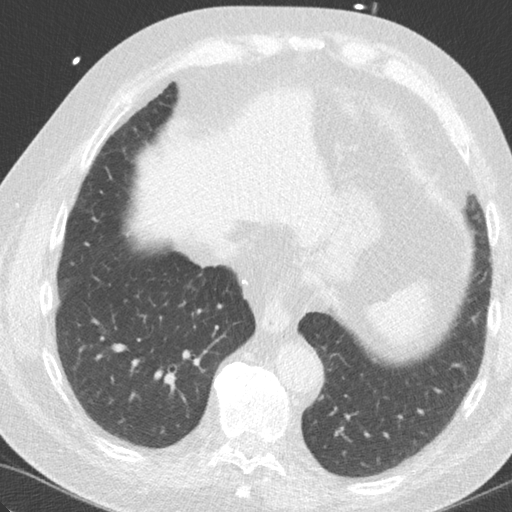
[im 10/43  lung]
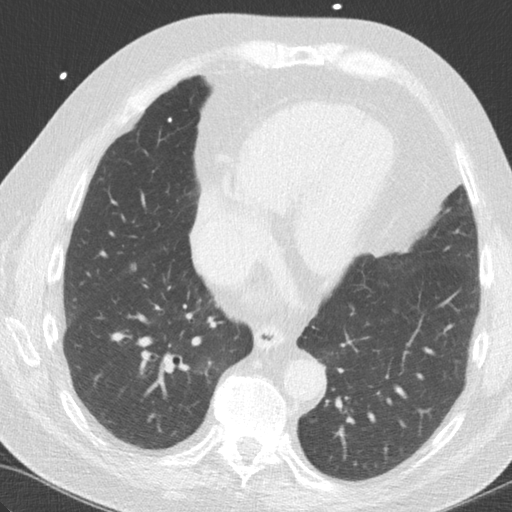
[im 15/43  lung]
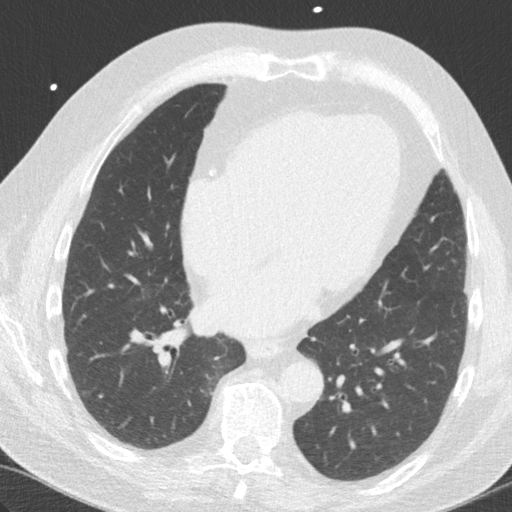
[im 19/43  lung]
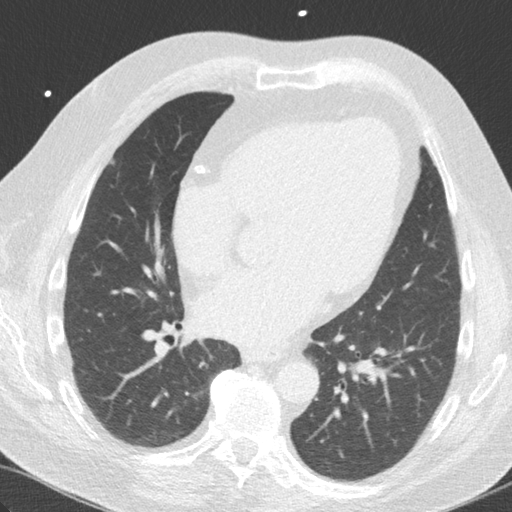
[im 24/43  lung]
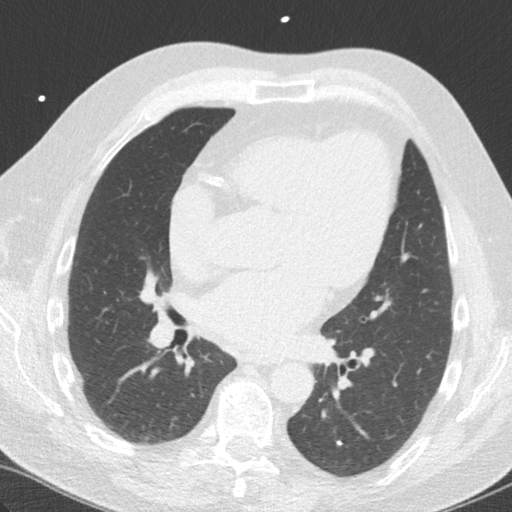
[im 29/43  lung]
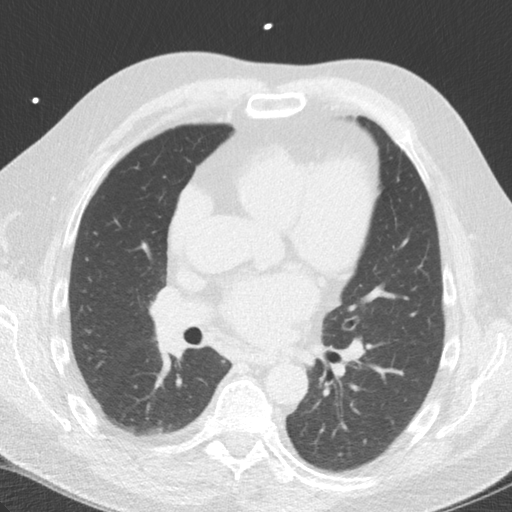
[im 33/43  lung]
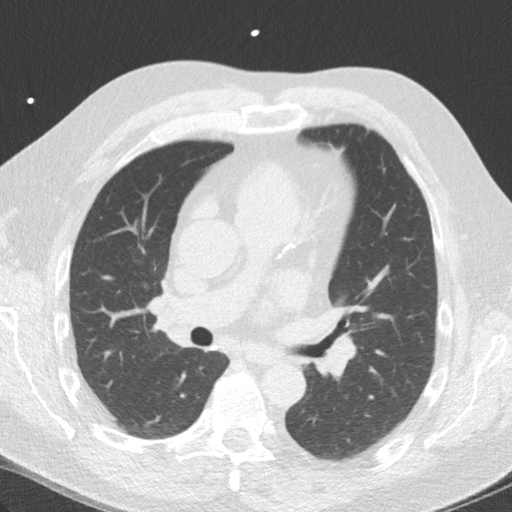
[im 38/43  lung]
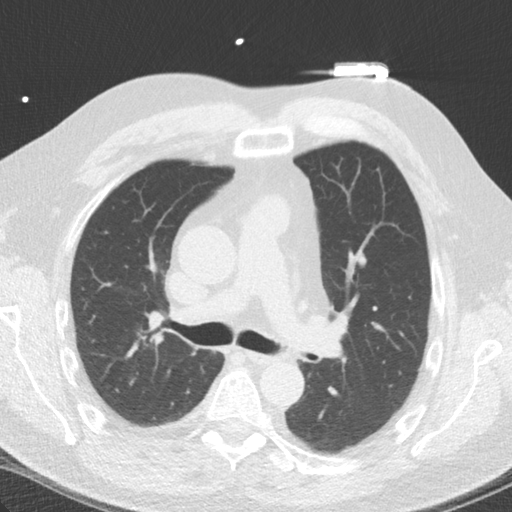

[16 of 20 positions shown; findings below may reference images not displayed]

FINDINGS: Vascular: Heart is upper limits normal in size. Aorta is normal
caliber.

Mediastinum/Nodes: No adenopathy in the lower mediastinum or hila.

Lungs/Pleura: No confluent airspace opacities or effusions. Small
calcified granuloma in the right lower lobe.

Upper Abdomen: Imaging into the upper abdomen shows no acute
findings.

Musculoskeletal: Chest wall soft tissues are unremarkable. No acute
bony abnormality.
IMPRESSION: No acute or significant extracardiac abnormality.
FINDINGS: Non-cardiac: See separate report from [REDACTED].

Ascending Aorta: Normal size.  No calcifications.

Pericardium: Normal.

Coronary arteries: Normal origin.
IMPRESSION: Coronary calcium score of [KA]. This was 98 percentile for age and
sex matched control.

*** End of Addendum ***

## 2018-08-31 NOTE — Telephone Encounter (Signed)
° °  Jake Liu Pre-operative Risk Assessment    Request for surgical clearance:  1. What type of surgery is being performed? Colonoscopy and/or upper endoscopy    2. When is this surgery scheduled? 09/08/2018   3. What type of clearance is required (medical clearance vs. Pharmacy clearance to hold med vs. Both)? Pharmacy   4. Are there any medications that need to be held prior to surgery and how long? Xarelto x 3 days    5. Practice name and name of physician performing surgery? [facility], Jake Klippel, PA    6. What is your office phone number [phone]    7.   What is your office fax number [phone]  8.   Anesthesia type (None, local, MAC, general) ? Monitored    Jake Liu 08/31/2018, 4:55 PM  _________________________________________________________________   (provider comments below)

## 2018-09-01 NOTE — Telephone Encounter (Signed)
Pharm please address xarelto

## 2018-09-02 NOTE — Telephone Encounter (Signed)
Pharm please address 

## 2018-09-03 ENCOUNTER — Telehealth: Payer: Self-pay | Admitting: *Deleted

## 2018-09-03 DIAGNOSIS — E782 Mixed hyperlipidemia: Secondary | ICD-10-CM

## 2018-09-03 MED ORDER — ATORVASTATIN CALCIUM 80 MG PO TABS: 80.0000 mg | ORAL_TABLET | Freq: Every day | ORAL | 3 refills | Status: DC

## 2018-09-03 NOTE — Telephone Encounter (Deleted)
-----   Message from Minna Merritts, MD sent at 09/02/2018  9:57 PM EST ----- Calcium score markedly elevated for his age, >66 98% percentile Would have fasting lipids done Will likely need lipitor 80 daily Goal LDL in the low 60s Will cc M.A.

## 2018-09-03 NOTE — Telephone Encounter (Signed)
[facility] calling to check on status Needs this completed today - offices closes at 4pm Please review and complete

## 2018-09-03 NOTE — Telephone Encounter (Signed)
Patient calling  States that [facility] has made several attempts to find out status of clearance Will need clearance completed by 4pm today or surgery will be cancelled Contacted [facility] to get a hold of who is working preop today States they will be reviewing and getting in touch with [facility] ASAP

## 2018-09-03 NOTE — Telephone Encounter (Signed)
Will call when staff schedule for may available to schedule

## 2018-09-03 NOTE — Telephone Encounter (Signed)
Reviewed results and recommendations with patient and discussed medication increase. Also encouraged him to review diets that will help as well. He verbalized understanding of our conversation with no further questions at this time.

## 2018-09-03 NOTE — Telephone Encounter (Signed)
[facility] calling back to check status, states they need to know by 4 pm today.

## 2018-09-03 NOTE — Addendum Note (Signed)
Addended by: Valora Corporal on: 09/03/2018 10:37 AM   Modules accepted: Orders

## 2018-09-03 NOTE — Telephone Encounter (Signed)
-----   Message from Minna Merritts, MD sent at 09/02/2018  9:57 PM EST ----- Calcium score markedly elevated for his age, >20 98% percentile Would have fasting lipids done Will likely need lipitor 80 daily Goal LDL in the low 60s Will cc M.A.

## 2018-09-03 NOTE — Telephone Encounter (Signed)
   [facility] Pre-operative Risk Assessment    Request for surgical clearance:  1. What type of surgery is being performed?  Colonoscopy and upper endoscopy   2. When is this surgery scheduled?  09/08/18  3. What type of clearance is required (medical clearance vs. Pharmacy clearance to hold med vs. Both)? pharmacy  4. Are there any medications that need to be held prior to surgery and how long? Xarelto x 3 days  5. Practice name and name of physician performing surgery? [facility]  6. What is your office phone number [phone]    7.   What is your office fax number [phone]  8.   Anesthesia type (None, local, MAC, general) ? Not listed   Lucienne Minks 09/03/2018, 10:39 AM  _________________________________________________________________   (provider comments below)

## 2018-09-03 NOTE — Telephone Encounter (Signed)
[facility] spoke with Jake Liu will fax over the clearance

## 2018-09-03 NOTE — Telephone Encounter (Signed)
Patient with diagnosis of afib on xarelto for anticoagulation.    Procedure: Colonoscopy and/or upper endoscopy   Date of procedure: 09/08/2018  CHADS2-VASc score of  3 (CHF, HTN, AGE, DM2, stroke/tia x 2, CAD, AGE, male) Patient had a questionable TIA is 2013  Due to patients hx of possible TIA and low bleeding risk of procedure, would recommend holding Xarelto for 1 day prior to procedure.

## 2018-09-03 NOTE — Telephone Encounter (Signed)
   Primary Cardiologist: Dr. [name]  Chart reviewed as part of pre-operative protocol coverage. Requesting provider has requested pharmacy clearance only, not medical clearance.   As per our pharmacy staff: Patient with diagnosis of afib on xarelto for anticoagulation.    Procedure: Colonoscopy and/or upper endoscopy Date of procedure: 09/08/2018  CHADS2-VASc score of  3 (CHF, HTN, AGE, DM2, stroke/tia x 2, CAD, AGE, male) Patient had a questionable TIA is 2013  Due to patients hx of possible TIA and low bleeding risk of procedure, would recommend holding Xarelto for 1 day prior to procedure.   I will route this recommendation to the requesting party via Epic fax function and remove from pre-op pool.  Please call with questions.  Tami Lin Macy Lingenfelter, PA 09/03/2018, 3:17 PM

## 2018-09-07 ENCOUNTER — Ambulatory Visit: Payer: Self-pay

## 2018-09-07 ENCOUNTER — Telehealth: Payer: Self-pay

## 2018-09-07 NOTE — Telephone Encounter (Signed)
I called patient as a courtesy to see how he was doing & let him know that message was routed to [place], but she was not back in the office until tomorrow. I left detailed VM.

## 2018-09-07 NOTE — Telephone Encounter (Signed)
Pt called to to say he ate 2 dulcolax suppository. He says that they caused him to vomit . He called his GI doctor who canceled his appointment for tomorrow. NT contacted Poison Control and Mr Hilgers was able to speak to the nurse there [place]. Per beth the suppositories are not toxic.  They will still cause bowel action. Pt is aware of this and she also recommended that he drink plenty of fluids.Pt verbalized understanding of all information.Marland Kitchen He is concerned because the GI doctor cancelled his procedure tomorrow. He is anxious for [name] NP to know about this incident. Will route to office.  Reason for Disposition . All OTHER POTENTIALLY HARMFUL SUBSTANCES (e.g., nearly all chemicals, plants, more than a double dose of a drug, took someone else's medicine)  Answer Assessment - Initial Assessment Questions 1. SUBSTANCE: "What was swallowed?" If necessary, have the caller look at the label on the container.      dulcolax suppository 2. AMOUNT: "How much was swallowed?" (Err on the side of recording the maximal amount that is missing)      swallowed 2  3. ONSET: "When was it probably swallowed?" (Minutes or hours ago)      2.5 hours ago 4. SYMPTOMS: "Do you have any symptoms?" If so, ask: "What are they?" (e.g., abdominal pain, vomiting, weakness)      Abdominal pain vomiting 2times 5. SUICIDAL: "Did you take this to hurt or kill yourself?"     no 6. PREGNANCY: "Is there any chance you are pregnant?" "When was your last menstrual period?"     N/A  Protocols used: POISONING-A-AH

## 2018-09-08 NOTE — Telephone Encounter (Signed)
Call pt He did the right thing in contacting poison control  As for procedure, what procedure is he referring to?   Is this colonoscopy with Vira Agar? Has he seen GI yet for consult?

## 2018-09-08 NOTE — Telephone Encounter (Signed)
Procedure was upper & lower GI. They will not be able to reschedule until 4/15. He said that he is till having pains in his sides intermittently, but nothing is worse. He said "things are what they are". He just doesn't want anything to get any worse.

## 2018-09-08 NOTE — Telephone Encounter (Signed)
Advise pt to call elliott's office and see if earlier office visit for elliott to discuss symptoms.   April is a long way away; he needs to share this concern with Elliott's office as well as they likely have cancellation list

## 2018-09-20 ENCOUNTER — Encounter: Payer: Self-pay | Admitting: Urology

## 2018-09-20 ENCOUNTER — Ambulatory Visit: Payer: Self-pay | Admitting: Urology

## 2018-09-27 NOTE — Telephone Encounter (Signed)
Call patient circle back in regards to his colonoscopy, EGD.  Was able to move this up? Does he need anything from our office?

## 2018-09-27 NOTE — Telephone Encounter (Signed)
LM that I had alled [facility] to see if he could be worked in [facility] than April. He is on wait list for any cancellations.

## 2018-09-27 NOTE — Telephone Encounter (Signed)
Please call over to [facility] GI, and asked to speak to Jewell.  Please convey the patient's concern for colonoscopy, EGD which was postponed.  Please asked there is a cancellation list, and certainly make them aware of patient's concern.

## 2018-09-29 ENCOUNTER — Ambulatory Visit: Payer: Self-pay | Admitting: *Deleted

## 2018-09-29 NOTE — Telephone Encounter (Signed)
Patient called to report he "thinks he has lost approximately 15 pounds over the last one-two weeks". His sharp intermittent pains in his left side has continued. Has occasional nausea after coffee and some meals. No vomiting. Denies bloating/belching/blood/mucous in stool. BM once daily that is a combo of liquid and formed stool. No difficulty voiding.  Reporting he feels week with all the weight loss. He has appointment with [name], NP on Friday 3/6. He would like for his stool to be tested at that visit, if possible. Routing to PCP for any advice.

## 2018-09-30 NOTE — Telephone Encounter (Signed)
Call pt Very concerned that symptoms may suggest a peptic ulcer disease ( stomach ulcer). He must stop drinking alcohol. No Nsaids which includes Advil, ibuprofen , Motrin, Aleve etc.  I don't see Prilosec on his chart. Advise that he start OTC Prilosec 40mg  daily.   Please ask him to call [facility] and make a follow up appt ASAP . He needs to be seen ahead of his colonoscopy/EGD.

## 2018-10-01 ENCOUNTER — Encounter: Payer: Self-pay | Admitting: Family

## 2018-10-01 ENCOUNTER — Ambulatory Visit: Payer: Commercial Managed Care - PPO | Admitting: Family

## 2018-10-01 VITALS — BP 127/79 | HR 73 | Temp 97.7°F | Wt 218.0 lb

## 2018-10-01 DIAGNOSIS — R109 Unspecified abdominal pain: Secondary | ICD-10-CM

## 2018-10-01 DIAGNOSIS — K219 Gastro-esophageal reflux disease without esophagitis: Secondary | ICD-10-CM | POA: Diagnosis not present

## 2018-10-01 DIAGNOSIS — K429 Umbilical hernia without obstruction or gangrene: Secondary | ICD-10-CM | POA: Diagnosis not present

## 2018-10-01 MED ORDER — OMEPRAZOLE 40 MG PO CPDR: 40.0000 mg | DELAYED_RELEASE_CAPSULE | Freq: Every day | ORAL | 3 refills | Status: DC

## 2018-10-01 NOTE — Patient Instructions (Addendum)
Start prilosec 40mg .   Please discuss Barrett's esophagus with Dr Alice Reichert and ensure you have a game plan.   Let me know how you are you doing. Let me know if any new or worsening symptoms.

## 2018-10-01 NOTE — Assessment & Plan Note (Signed)
Reducible, low clinical suspicion for incarceration.  patient politely declines referral surgery consult at this time. Will follow.

## 2018-10-01 NOTE — Assessment & Plan Note (Addendum)
Improved. Non toxic in appearance. Benign exam. Significant weight loss.  Suspect PUD, GERD; worsened by caffeine.  Trial of Prilosec 40 mg.  Also discussed with patient at length his history of potentially Barrett esophagus ( last EGD) .  Strongly encouraged him to stay on a PPI and to further discuss this with his gastroenterologist.  Patient verbalized understanding of all.

## 2018-10-01 NOTE — Progress Notes (Signed)
Subjective:    Patient ID: Jake Liu, male    DOB: April 24, 1961, 58 y.o.   MRN: [id number]  CC: Jake Liu is a 58 y.o. male who presents today for follow up.   HPI: Bilateral flank side pain. No pain today. Had felt like a knife in sides. Thinks caffeine is trigger, decreased pain 'big difference' since limited caffeine. Drinks 'very strong' coffee.   Has been exercising everyday, prior had done being any exercising. Normal BMs. No vomiting, nausea, fever. Has changed diet, eating less cheese, procesed meat.  Used to have acid reflux when drinking red wine, noticed when laying supine.   No alcohol for past week. Has not been on prilosec as no epigastric pain. No NSAIDs.   Drinking Milk of Magnesia without relief.   Has lost 18 pounds in past 2 weeks.   H/o barrett's esophagus.     Colonoscopy scheduled Dr Alice Reichert 11/10/18; original was canceled  Ct ab and pelvis 08/18/18. No acute findings. Hepatic steatosis.  HISTORY:  Past Medical History:  Diagnosis Date  . Atrial fibrillation ([place])   . Cardiomyopathy ([place])    EF: 45%  . Sciatica    Past Surgical History:  Procedure Laterality Date  . KNEE SURGERY     Family History  Problem Relation Age of Onset  . Heart disease Mother 60  . Asthma Father     Allergies: Sulfa antibiotics; Sulfasalazine; Bactrim [sulfamethoxazole-trimethoprim]; and Lisinopril Current Outpatient Medications on File Prior to Visit  Medication Sig Dispense Refill  . amLODipine (NORVASC) 5 MG tablet Take 1 tablet (5 mg total) by mouth daily. 90 tablet 4  . atorvastatin (LIPITOR) 80 MG tablet Take 1 tablet (80 mg total) by mouth daily. 90 tablet 3  . metoprolol succinate (TOPROL-XL) 50 MG 24 hr tablet Take 1 tablet (50 mg total) by mouth daily. Take with or immediately following a meal. 30 tablet 3  . rivaroxaban (XARELTO) 20 MG TABS tablet Take by mouth.     No current facility-administered medications on file prior to visit.     Social History     Tobacco Use  . Smoking status: Never Smoker  . Smokeless tobacco: Never Used  Substance Use Topics  . Alcohol use: Yes    Alcohol/week: 14.0 standard drinks    Types: 7 Standard drinks or equivalent, 7 Glasses of wine per week    Comment: drinks at night., bottle and half of red wine;  Drinks 10 cups coffee daily  . Drug use: No    Review of Systems  Constitutional: Negative for chills and fever.  Respiratory: Negative for cough.   Cardiovascular: Negative for chest pain and palpitations.  Gastrointestinal: Positive for abdominal pain. Negative for blood in stool, constipation, diarrhea, nausea and vomiting.      Objective:    BP 127/79 (BP Location: Left Arm, Patient Position: Sitting, Cuff Size: Large)   Pulse 73   Temp 97.7 F (36.5 C)   Wt 218 lb (98.9 kg)   SpO2 98%   BMI 29.57 kg/m  BP Readings from Last 3 Encounters:  10/01/18 127/79  08/20/18 124/82  08/18/18 132/76   Wt Readings from Last 3 Encounters:  10/01/18 218 lb (98.9 kg)  08/20/18 236 lb 12.8 oz (107.4 kg)  08/18/18 236 lb (107 kg)    Physical Exam Vitals signs reviewed.  Constitutional:      Appearance: Normal appearance. He is well-developed.  Cardiovascular:     Rate and Rhythm: Regular rhythm.  Heart sounds: Normal heart sounds.  Pulmonary:     Effort: Pulmonary effort is normal. No respiratory distress.     Breath sounds: Normal breath sounds. No wheezing or rales.  Abdominal:     General: Bowel sounds are normal. There is no distension.     Palpations: Abdomen is soft. Abdomen is not rigid. There is no fluid wave or mass.     Tenderness: There is no abdominal tenderness. There is no guarding or rebound. Negative signs include Murphy's sign and McBurney's sign.       Comments: Large reducible ventral hernia. Umbilical hernia present, reducible as well. Non tender.   Skin:    General: Skin is warm and dry.  Neurological:     Mental Status: He is alert.  Psychiatric:         Speech: Speech normal.        Behavior: Behavior normal.        Assessment & Plan:   Problem List Items Addressed This Visit      Digestive   GERD (gastroesophageal reflux disease) - Primary   Relevant Medications   omeprazole (PRILOSEC) 40 MG capsule     Other   Umbilical hernia    Reducible, low clinical suspicion for incarceration.  patient politely declines referral surgery consult at this time. Will follow.       Flank pain    Improved. Non toxic in appearance. Benign exam. Significant weight loss.  Suspect PUD, GERD; worsened by caffeine.  Trial of Prilosec 40 mg.  Also discussed with patient at length his history of potentially Barrett esophagus ( last EGD) .  Strongly encouraged him to stay on a PPI and to further discuss this with his gastroenterologist.  Patient verbalized understanding of all.          I am having Jake Liu start on omeprazole. I am also having him maintain his metoprolol succinate, rivaroxaban, amLODipine, and atorvastatin.   Meds ordered this encounter  Medications  . omeprazole (PRILOSEC) 40 MG capsule    Sig: Take 1 capsule (40 mg total) by mouth daily.    Dispense:  30 capsule    Refill:  3    Order Specific Question:   Supervising Provider    Answer:   Crecencio Mc [[id number]]    Return precautions given.   Risks, benefits, and alternatives of the medications and treatment plan prescribed today were discussed, and patient expressed understanding.   Education regarding symptom management and diagnosis given to patient on AVS.  Continue to follow with Burnard Hawthorne, FNP for routine health maintenance.   Jake Liu and I agreed with plan.   [name], FNP

## 2018-10-12 ENCOUNTER — Encounter: Payer: Self-pay | Admitting: *Deleted

## 2018-10-13 ENCOUNTER — Ambulatory Visit: Payer: Commercial Managed Care - PPO | Admitting: Anesthesiology

## 2018-10-13 ENCOUNTER — Encounter
Admission: RE | Disposition: A | Discharge status: Home / Self Care | Payer: Self-pay | Source: Home / Self Care | Attending: Internal Medicine

## 2018-10-13 ENCOUNTER — Encounter: Payer: Self-pay | Admitting: Anesthesiology

## 2018-10-13 ENCOUNTER — Ambulatory Visit
Admission: RE | Admit: 2018-10-13 | Discharge: 2018-10-13 | Disposition: A | Discharge status: Home / Self Care | Payer: Commercial Managed Care - PPO | Attending: Internal Medicine | Admitting: Internal Medicine

## 2018-10-13 ENCOUNTER — Other Ambulatory Visit: Payer: Self-pay

## 2018-10-13 DIAGNOSIS — Z8673 Personal history of transient ischemic attack (TIA), and cerebral infarction without residual deficits: Secondary | ICD-10-CM | POA: Insufficient documentation

## 2018-10-13 DIAGNOSIS — M1990 Unspecified osteoarthritis, unspecified site: Secondary | ICD-10-CM | POA: Insufficient documentation

## 2018-10-13 DIAGNOSIS — I509 Heart failure, unspecified: Secondary | ICD-10-CM | POA: Diagnosis not present

## 2018-10-13 DIAGNOSIS — Z8371 Family history of colonic polyps: Secondary | ICD-10-CM | POA: Insufficient documentation

## 2018-10-13 DIAGNOSIS — R1013 Epigastric pain: Secondary | ICD-10-CM | POA: Diagnosis not present

## 2018-10-13 DIAGNOSIS — K228 Other specified diseases of esophagus: Secondary | ICD-10-CM | POA: Insufficient documentation

## 2018-10-13 DIAGNOSIS — K2950 Unspecified chronic gastritis without bleeding: Secondary | ICD-10-CM | POA: Insufficient documentation

## 2018-10-13 DIAGNOSIS — I429 Cardiomyopathy, unspecified: Secondary | ICD-10-CM | POA: Diagnosis not present

## 2018-10-13 DIAGNOSIS — I4891 Unspecified atrial fibrillation: Secondary | ICD-10-CM | POA: Insufficient documentation

## 2018-10-13 DIAGNOSIS — Z888 Allergy status to other drugs, medicaments and biological substances status: Secondary | ICD-10-CM | POA: Insufficient documentation

## 2018-10-13 DIAGNOSIS — G4730 Sleep apnea, unspecified: Secondary | ICD-10-CM | POA: Diagnosis not present

## 2018-10-13 DIAGNOSIS — K640 First degree hemorrhoids: Secondary | ICD-10-CM | POA: Diagnosis not present

## 2018-10-13 DIAGNOSIS — Z87891 Personal history of nicotine dependence: Secondary | ICD-10-CM | POA: Insufficient documentation

## 2018-10-13 DIAGNOSIS — Z1211 Encounter for screening for malignant neoplasm of colon: Secondary | ICD-10-CM | POA: Insufficient documentation

## 2018-10-13 DIAGNOSIS — K5730 Diverticulosis of large intestine without perforation or abscess without bleeding: Secondary | ICD-10-CM | POA: Diagnosis not present

## 2018-10-13 DIAGNOSIS — I110 Hypertensive heart disease with heart failure: Secondary | ICD-10-CM | POA: Diagnosis not present

## 2018-10-13 DIAGNOSIS — Z79899 Other long term (current) drug therapy: Secondary | ICD-10-CM | POA: Diagnosis not present

## 2018-10-13 DIAGNOSIS — K219 Gastro-esophageal reflux disease without esophagitis: Secondary | ICD-10-CM | POA: Insufficient documentation

## 2018-10-13 DIAGNOSIS — Z881 Allergy status to other antibiotic agents status: Secondary | ICD-10-CM | POA: Diagnosis not present

## 2018-10-13 DIAGNOSIS — Z882 Allergy status to sulfonamides status: Secondary | ICD-10-CM | POA: Diagnosis not present

## 2018-10-13 DIAGNOSIS — Z7901 Long term (current) use of anticoagulants: Secondary | ICD-10-CM | POA: Insufficient documentation

## 2018-10-13 HISTORY — DX: Personal history of transient ischemic attack (TIA), and cerebral infarction without residual deficits: Z86.73

## 2018-10-13 HISTORY — DX: Unspecified osteoarthritis, unspecified site: M19.90

## 2018-10-13 HISTORY — DX: Gastro-esophageal reflux disease without esophagitis: K21.9

## 2018-10-13 HISTORY — DX: Angina pectoris, unspecified: I20.9

## 2018-10-13 HISTORY — DX: Essential (primary) hypertension: I10

## 2018-10-13 HISTORY — DX: Heart failure, unspecified: I50.9

## 2018-10-13 HISTORY — PX: ESOPHAGOGASTRODUODENOSCOPY (EGD) WITH PROPOFOL: SHX5813

## 2018-10-13 HISTORY — DX: Insomnia, unspecified: G47.00

## 2018-10-13 HISTORY — PX: COLONOSCOPY WITH PROPOFOL: SHX5780

## 2018-10-13 HISTORY — DX: Sleep apnea, unspecified: G47.30

## 2018-10-13 LAB — HM COLONOSCOPY

## 2018-10-13 SURGERY — COLONOSCOPY WITH PROPOFOL
Anesthesia: General

## 2018-10-13 MED ORDER — PROPOFOL 10 MG/ML IV BOLUS
INTRAVENOUS | Status: DC | PRN
  Administered 2018-10-13 (×9): 50 mg via INTRAVENOUS

## 2018-10-13 MED ORDER — SODIUM CHLORIDE 0.9 % IV SOLN
INTRAVENOUS | Status: DC
  Administered 2018-10-13: 13:00:00 via INTRAVENOUS

## 2018-10-13 MED ORDER — GLYCOPYRROLATE 0.2 MG/ML IJ SOLN
INTRAMUSCULAR | Status: DC | PRN
  Administered 2018-10-13: 0.2 mg via INTRAVENOUS

## 2018-10-13 NOTE — Transfer of Care (Signed)
Immediate Anesthesia Transfer of Care Note  Patient: Apollo Timothy  Procedure(s) Performed: COLONOSCOPY WITH PROPOFOL (N/A ) ESOPHAGOGASTRODUODENOSCOPY (EGD) WITH PROPOFOL (N/A )  Patient Location: Endoscopy Unit  Anesthesia Type:General  Level of Consciousness: drowsy and responds to stimulation  Airway & Oxygen Therapy: Patient Spontanous Breathing and Patient connected to face mask oxygen  Post-op Assessment: Report given to RN and Post -op Vital signs reviewed and stable  Post vital signs: Reviewed and stable  Last Vitals:  Vitals Value Taken Time  BP 122/82 10/13/2018  1:08 PM  Temp 36.1 C 10/13/2018  1:08 PM  Pulse 75 10/13/2018  1:10 PM  Resp 18 10/13/2018  1:10 PM  SpO2 100 % 10/13/2018  1:10 PM  Vitals shown include unvalidated device data.  Last Pain:  Vitals:   10/13/18 1308  TempSrc: Tympanic  PainSc: Asleep         Complications: No apparent anesthesia complications

## 2018-10-13 NOTE — Interval H&P Note (Signed)
History and Physical Interval Note:  10/13/2018 12:20 PM  Jake Liu  has presented today for surgery, with the diagnosis of UPPER ABD PAIN F HX POLYPS.  The various methods of treatment have been discussed with the patient and family. After consideration of risks, benefits and other options for treatment, the patient has consented to  Procedure(s): COLONOSCOPY WITH PROPOFOL (N/A) ESOPHAGOGASTRODUODENOSCOPY (EGD) WITH PROPOFOL (N/A) as a surgical intervention.  The patient's history has been reviewed, patient examined, no change in status, stable for surgery.  I have reviewed the patient's chart and labs.  Questions were answered to the patient's satisfaction.     [place], [place]

## 2018-10-13 NOTE — Op Note (Signed)
[facility] Gastroenterology Patient Name: Jake Liu Procedure Date: 10/13/2018 12:43 PM MRN: [id number] Account #: [id number] Date of Birth: 12/04/1960 Admit Type: Outpatient Age: 58 Room: [facility] ENDO ROOM 3 Gender: Male Note Status: Finalized Procedure:            Colonoscopy Indications:          Colon cancer screening in patient at increased risk:                        Family history of 1st-degree relative with colon polyps Providers:            [name]. Alice Reichert MD, MD Referring MD:         Yvetta Coder. Arnett (Referring MD) Medicines:            Propofol per Anesthesia Complications:        No immediate complications. Procedure:            Pre-Anesthesia Assessment:                       - The risks and benefits of the procedure and the                        sedation options and risks were discussed with the                        patient. All questions were answered and informed                        consent was obtained.                       - Patient identification and proposed procedure were                        verified prior to the procedure by the nurse. The                        procedure was verified in the procedure room.                       - ASA Grade Assessment: III - A patient with severe                        systemic disease.                       - After reviewing the risks and benefits, the patient                        was deemed in satisfactory condition to undergo the                        procedure.                       After obtaining informed consent, the colonoscope was                        passed under direct vision. Throughout the procedure,  the patient's blood pressure, pulse, and oxygen                        saturations were monitored continuously. The                        Colonoscope was introduced through the anus and                        advanced to the the cecum, identified by  appendiceal                        orifice and ileocecal valve. The colonoscopy was                        performed without difficulty. The patient tolerated the                        procedure well. The quality of the bowel preparation                        was excellent. The ileocecal valve, appendiceal                        orifice, and rectum were photographed. Findings:      The perianal and digital rectal examinations were normal. Pertinent       negatives include normal sphincter tone and no palpable rectal lesions.      Many small-mouthed diverticula were found in the sigmoid colon.      Non-bleeding internal hemorrhoids were found during retroflexion. The       hemorrhoids were Grade I (internal hemorrhoids that do not prolapse).      The exam was otherwise without abnormality. Impression:           - Diverticulosis in the sigmoid colon.                       - Non-bleeding internal hemorrhoids.                       - The examination was otherwise normal.                       - No specimens collected. Recommendation:       - Await pathology results from EGD, also performed                        today.                       - Patient has a contact number available for                        emergencies. The signs and symptoms of potential                        delayed complications were discussed with the patient.                        Return to normal activities tomorrow. Written discharge  instructions were provided to the patient.                       - Resume previous diet.                       - Continue present medications.                       - Repeat colonoscopy in 5 years for screening purposes.                       - Return to physician assistant in 2 months.                       - The findings and recommendations were discussed with                        the patient and their family. Procedure Code(s):    --- Professional ---                        R0076, Colorectal cancer screening; colonoscopy on                        individual at high risk Diagnosis Code(s):    --- Professional ---                       K57.30, Diverticulosis of large intestine without                        perforation or abscess without bleeding                       K64.0, First degree hemorrhoids                       Z83.71, Family history of colonic polyps CPT copyright 2018 American Medical Association. All rights reserved. The codes documented in this report are preliminary and upon coder review may  be revised to meet current compliance requirements. Efrain Sella MD, MD 10/13/2018 1:08:39 PM This report has been signed electronically. Number of Addenda: 0 Note Initiated On: 10/13/2018 12:43 PM Scope Withdrawal Time: 0 hours 6 minutes 2 seconds  Total Procedure Duration: 0 hours 8 minutes 2 seconds       [facility]

## 2018-10-13 NOTE — Anesthesia Post-op Follow-up Note (Signed)
Anesthesia QCDR form completed.        

## 2018-10-13 NOTE — Anesthesia Preprocedure Evaluation (Signed)
Anesthesia Evaluation  Patient identified by MRN, date of birth, ID band Patient awake    Reviewed: Allergy & Precautions, NPO status , Patient's Chart, lab work & pertinent test results, reviewed documented beta blocker date and time   Airway Mallampati: II  TM Distance: >3 FB     Dental  (+) Chipped   Pulmonary sleep apnea , former smoker,           Cardiovascular hypertension, Pt. on medications and Pt. on home beta blockers + angina +CHF  + dysrhythmias Atrial Fibrillation      Neuro/Psych PSYCHIATRIC DISORDERS Anxiety TIA Neuromuscular disease    GI/Hepatic GERD  ,  Endo/Other    Renal/GU      Musculoskeletal  (+) Arthritis ,   Abdominal   Peds  Hematology   Anesthesia Other Findings EF 45. EKG ok.  Reproductive/Obstetrics                             Anesthesia Physical Anesthesia Plan  ASA: III  Anesthesia Plan: General   Post-op Pain Management:    Induction: Intravenous  PONV Risk Score and Plan:   Airway Management Planned:   Additional Equipment:   Intra-op Plan:   Post-operative Plan:   Informed Consent: I have reviewed the patients History and Physical, chart, labs and discussed the procedure including the risks, benefits and alternatives for the proposed anesthesia with the patient or authorized representative who has indicated his/her understanding and acceptance.       Plan Discussed with: CRNA  Anesthesia Plan Comments:         Anesthesia Quick Evaluation

## 2018-10-13 NOTE — Op Note (Signed)
[facility] Gastroenterology Patient Name: Jake Liu Procedure Date: 10/13/2018 12:43 PM MRN: [id number] Account #: [id number] Date of Birth: 1961/07/20 Admit Type: Outpatient Age: 58 Room: [facility] ENDO ROOM 3 Gender: Male Note Status: Finalized Procedure:            Upper GI endoscopy Indications:          Epigastric abdominal pain, Suspected esophageal reflux Providers:            [name]. Alice Reichert MD, MD Referring MD:         Yvetta Coder. Arnett (Referring MD) Medicines:            Propofol per Anesthesia Complications:        No immediate complications. Procedure:            Pre-Anesthesia Assessment:                       - The risks and benefits of the procedure and the                        sedation options and risks were discussed with the                        patient. All questions were answered and informed                        consent was obtained.                       - Patient identification and proposed procedure were                        verified prior to the procedure by the nurse. The                        procedure was verified in the procedure room.                       - ASA Grade Assessment: III - A patient with severe                        systemic disease.                       - After reviewing the risks and benefits, the patient                        was deemed in satisfactory condition to undergo the                        procedure.                       After obtaining informed consent, the endoscope was                        passed under direct vision. Throughout the procedure,                        the patient's blood pressure, pulse, and oxygen  saturations were monitored continuously. The Endoscope                        was introduced through the mouth, and advanced to the                        third part of duodenum. The upper GI endoscopy was                        accomplished without  difficulty. The patient tolerated                        the procedure well. Findings:      The Z-line was irregular and was found at the gastroesophageal junction.       Mucosa was biopsied with a cold forceps for histology. One specimen       bottle was sent to pathology.      Localized minimal inflammation characterized by erythema was found in       the gastric antrum. Biopsies were taken with a cold forceps for       Helicobacter pylori testing.      The cardia and gastric fundus were normal on retroflexion.      The examined duodenum was normal.      The exam was otherwise without abnormality. Impression:           - Z-line irregular, at the gastroesophageal junction.                        Biopsied.                       - Gastritis. Biopsied.                       - Normal examined duodenum.                       - The examination was otherwise normal. Recommendation:       - Await pathology results.                       - Proceed with colonoscopy Procedure Code(s):    --- Professional ---                       [phone], Esophagogastroduodenoscopy, flexible, transoral;                        with biopsy, single or multiple Diagnosis Code(s):    --- Professional ---                       R10.13, Epigastric pain                       K29.70, Gastritis, unspecified, without bleeding                       K22.8, Other specified diseases of esophagus CPT copyright 2018 American Medical Association. All rights reserved. The codes documented in this report are preliminary and upon coder review may  be revised to meet current compliance requirements. Efrain Sella MD, MD 10/13/2018 12:55:47 PM This report has been signed electronically. Number of  Addenda: 0 Note Initiated On: 10/13/2018 12:43 PM      [facility]

## 2018-10-13 NOTE — H&P (Addendum)
Outpatient short stay form Pre-procedure 10/13/2018 12:11 PM Teodoro K. Alice Reichert, M.D.  Primary Physician:  [name], FNP  Reason for visit:  Epigastric pain, GERD  History of present illness: Patient with typical GERD symptoms worsened with spicy foods, caffeine, etc. Has refused PPI + carafate therapy. Has intermittent epigastric pain.                            Patient presents for colonoscopy for a family hx of colon polyps. The patient denies abnormal weight loss or rectal bleeding.     Current Facility-Administered Medications:  .  0.9 %  sodium chloride infusion, , Intravenous, Continuous, [place], [name], MD  Medications Prior to Admission  Medication Sig Dispense Refill Last Dose  . amLODipine (NORVASC) 5 MG tablet Take 1 tablet (5 mg total) by mouth daily. 90 tablet 4 Taking  . atorvastatin (LIPITOR) 80 MG tablet Take 1 tablet (80 mg total) by mouth daily. 90 tablet 3 Taking  . metoprolol succinate (TOPROL-XL) 50 MG 24 hr tablet Take 1 tablet (50 mg total) by mouth daily. Take with or immediately following a meal. 30 tablet 3 Taking  . omeprazole (PRILOSEC) 40 MG capsule Take 1 capsule (40 mg total) by mouth daily. 30 capsule 3   . rivaroxaban (XARELTO) 20 MG TABS tablet Take by mouth.   Taking     Allergies  Allergen Reactions  . Sulfa Antibiotics Hives    rash   . Sulfasalazine Hives  . Bactrim [Sulfamethoxazole-Trimethoprim]     Sulfa/Rash  . Lisinopril     cough     Past Medical History:  Diagnosis Date  . Anginal pain ([place])   . Arthritis   . Atrial fibrillation ([place])   . Cardiomyopathy ([place])    EF: 45%  . CHF (congestive heart failure) ([place])   . GERD (gastroesophageal reflux disease)   . History of TIAs   . Hypertension   . Insomnia   . Sciatica   . Sleep apnea     Review of systems:  Otherwise negative.    Physical Exam  Gen: Alert, oriented. Appears stated age.  HEENT: [place]/AT. PERRLA. Lungs: CTA, no wheezes. CV: RR nl S1, S2. Abd:  soft, benign, no masses. BS+ Ext: No edema. Pulses 2+    Planned procedures: Proceed with EGD and colonoscopy. The patient understands the nature of the planned procedure, indications, risks, alternatives and potential complications including but not limited to bleeding, infection, perforation, damage to internal organs and possible oversedation/side effects from anesthesia. The patient agrees and gives consent to proceed.  Please refer to procedure notes for findings, recommendations and patient disposition/instructions.     Teodoro K. Alice Reichert, M.D. Gastroenterology 10/13/2018  12:11 PM

## 2018-10-13 NOTE — Anesthesia Postprocedure Evaluation (Signed)
Anesthesia Post Note  Patient: Jake Liu  Procedure(s) Performed: COLONOSCOPY WITH PROPOFOL (N/A ) ESOPHAGOGASTRODUODENOSCOPY (EGD) WITH PROPOFOL (N/A )  Patient location during evaluation: Endoscopy Anesthesia Type: General Level of consciousness: awake and alert Pain management: pain level controlled Vital Signs Assessment: post-procedure vital signs reviewed and stable Respiratory status: spontaneous breathing, nonlabored ventilation, respiratory function stable and patient connected to nasal cannula oxygen Cardiovascular status: blood pressure returned to baseline and stable Postop Assessment: no apparent nausea or vomiting Anesthetic complications: no     Last Vitals:  Vitals:   10/13/18 1318 10/13/18 1328  BP: 113/75 118/77  Pulse: 75 69  Resp: 16 15  Temp:    SpO2: 99% 98%    Last Pain:  Vitals:   10/13/18 1328  TempSrc:   PainSc: 0-No pain                 Mikell Kazlauskas S

## 2018-10-14 LAB — SURGICAL PATHOLOGY

## 2019-01-18 NOTE — Telephone Encounter (Signed)
Please change order to [facility] for collection .    Attempted to contacted patient . LMOV

## 2019-01-24 ENCOUNTER — Other Ambulatory Visit: Payer: Self-pay | Admitting: Family

## 2019-01-24 DIAGNOSIS — K219 Gastro-esophageal reflux disease without esophagitis: Secondary | ICD-10-CM

## 2019-01-27 NOTE — Telephone Encounter (Signed)
lmov for patient to call office

## 2019-01-27 NOTE — Addendum Note (Signed)
Addended by: Valora Corporal on: 01/27/2019 02:49 PM   Modules accepted: Orders

## 2019-02-01 ENCOUNTER — Encounter: Payer: Self-pay | Admitting: Cardiovascular Disease

## 2019-02-01 NOTE — Telephone Encounter (Signed)
3rd attempt to schedule labs.  Removing from scheduling pool.  Will mail letter.

## 2019-02-01 NOTE — Telephone Encounter (Signed)
Not ready will call back in August

## 2019-03-09 ENCOUNTER — Other Ambulatory Visit: Payer: Self-pay | Admitting: Family

## 2019-03-09 NOTE — Telephone Encounter (Signed)
Called pt to inform him that refill that was sent in to pharmacy on 01/24/19 had 3 refills.  Patient said that pharmacy told him that there were no refills left on omeprazole (Prilosec) Rx.  Called and spoke to pharmacy staff to confirm no refills on Rx.  Pharmacy staff stated that there are 3 refills left on omeprazole Rx.  Called pt and made aware.

## 2019-03-09 NOTE — Telephone Encounter (Signed)
Medication Refill - Medication: omeprazole (PRILOSEC) 40 MG capsule Pt is out of medication   Has the patient contacted their pharmacy? No. (Agent: If no, request that the patient contact the pharmacy for the refill.) (Agent: If yes, when and what did the pharmacy advise?)  Preferred Pharmacy (with phone number or street name):  [id number] - [place], [place] - [place] [phone] (Phone) [phone] (Fax)     Agent: Please be advised that RX refills may take up to 3 business days. We ask that you follow-up with your pharmacy.

## 2019-03-11 ENCOUNTER — Ambulatory Visit: Payer: Commercial Managed Care - PPO | Admitting: Family

## 2019-03-25 ENCOUNTER — Other Ambulatory Visit: Payer: Self-pay | Admitting: Family

## 2019-03-25 DIAGNOSIS — I10 Essential (primary) hypertension: Secondary | ICD-10-CM

## 2019-07-05 ENCOUNTER — Other Ambulatory Visit: Payer: Self-pay | Admitting: Family

## 2019-07-05 DIAGNOSIS — K219 Gastro-esophageal reflux disease without esophagitis: Secondary | ICD-10-CM

## 2019-07-20 ENCOUNTER — Telehealth: Payer: Self-pay | Admitting: Family

## 2019-07-20 DIAGNOSIS — M25569 Pain in unspecified knee: Secondary | ICD-10-CM

## 2019-07-20 NOTE — Telephone Encounter (Signed)
Call pt Did he have an xray to ensure NO fracture?  Please explained that I think he would better served if he was just seen by orthopedic.  Even if we had the MRI, in regards to treating the etiology of his pain, that  would be under the guidance of ortho.   After an injury, I am concerned about meniscal tear or ligament tear  Have placed an urgent orthopedic referral.  May also inform about the orthopedic walk-in at [facility].

## 2019-07-20 NOTE — Telephone Encounter (Signed)
Can you order MRI or dose pt need to have an appt with you first?

## 2019-07-20 NOTE — Telephone Encounter (Signed)
Noted  

## 2019-07-20 NOTE — Telephone Encounter (Signed)
Spoke with pt and he stated that he would go to [facility] walk in tomorrow morning.

## 2019-07-20 NOTE — Telephone Encounter (Signed)
Pt states that he injured his knee on 07/15/2019. Went to the ER and they told him that they could not do a MRI unless his PCP approves it. Pt is in a lot of pain. Pt was last seen 09/2018.

## 2019-07-20 NOTE — Telephone Encounter (Signed)
LMTCB

## 2019-07-20 NOTE — Addendum Note (Signed)
Addended by: Burnard Hawthorne on: 07/20/2019 04:05 PM   Modules accepted: Orders

## 2019-07-25 NOTE — Telephone Encounter (Signed)
Did patient go to [facility]?

## 2019-07-25 NOTE — Telephone Encounter (Signed)
I tried to call patient but was unable to leave message due to VM being full.

## 2019-07-25 NOTE — Telephone Encounter (Signed)
Pt  Called returning your call

## 2019-07-25 NOTE — Telephone Encounter (Signed)
I spoke with patient & he was able to see someone at [facility] this morning. They drained two cups of fluid off his knee & gave him a cortisone shot. He said that he is currently icing it with ice machine & he feels almost back to normal. He wanted to say thanks for following up with him.

## 2019-08-06 ENCOUNTER — Encounter: Payer: Self-pay | Admitting: Emergency Medicine

## 2019-08-06 ENCOUNTER — Other Ambulatory Visit: Payer: Self-pay

## 2019-08-06 ENCOUNTER — Emergency Department: Payer: Commercial Managed Care - PPO

## 2019-08-06 ENCOUNTER — Emergency Department
Admission: EM | Admit: 2019-08-06 | Discharge: 2019-08-06 | Disposition: A | Discharge status: Home / Self Care | Payer: Commercial Managed Care - PPO | Attending: Emergency Medicine | Admitting: Emergency Medicine

## 2019-08-06 DIAGNOSIS — Z87891 Personal history of nicotine dependence: Secondary | ICD-10-CM | POA: Diagnosis not present

## 2019-08-06 DIAGNOSIS — Z7901 Long term (current) use of anticoagulants: Secondary | ICD-10-CM | POA: Insufficient documentation

## 2019-08-06 DIAGNOSIS — I10 Essential (primary) hypertension: Secondary | ICD-10-CM | POA: Diagnosis not present

## 2019-08-06 DIAGNOSIS — Z79899 Other long term (current) drug therapy: Secondary | ICD-10-CM | POA: Diagnosis not present

## 2019-08-06 DIAGNOSIS — I4891 Unspecified atrial fibrillation: Secondary | ICD-10-CM | POA: Diagnosis not present

## 2019-08-06 DIAGNOSIS — M25462 Effusion, left knee: Secondary | ICD-10-CM | POA: Insufficient documentation

## 2019-08-06 DIAGNOSIS — R0602 Shortness of breath: Secondary | ICD-10-CM | POA: Diagnosis present

## 2019-08-06 LAB — CBC WITH DIFFERENTIAL/PLATELET
Abs Immature Granulocytes: 0.07 10*3/uL (ref 0.00–0.07)
Basophils Absolute: 0.1 10*3/uL (ref 0.0–0.1)
Basophils Relative: 1 %
Eosinophils Absolute: 0.1 10*3/uL (ref 0.0–0.5)
Eosinophils Relative: 1 %
HCT: 45.4 % (ref 39.0–52.0)
Hemoglobin: 15.1 g/dL (ref 13.0–17.0)
Immature Granulocytes: 1 %
Lymphocytes Relative: 11 %
Lymphs Abs: 1.4 10*3/uL (ref 0.7–4.0)
MCH: 30.4 pg (ref 26.0–34.0)
MCHC: 33.3 g/dL (ref 30.0–36.0)
MCV: 91.3 fL (ref 80.0–100.0)
Monocytes Absolute: 1.4 10*3/uL — ABNORMAL HIGH (ref 0.1–1.0)
Monocytes Relative: 11 %
Neutro Abs: 9.8 10*3/uL — ABNORMAL HIGH (ref 1.7–7.7)
Neutrophils Relative %: 75 %
Platelets: 355 10*3/uL (ref 150–400)
RBC: 4.97 MIL/uL (ref 4.22–5.81)
RDW: 12.2 % (ref 11.5–15.5)
WBC: 12.8 10*3/uL — ABNORMAL HIGH (ref 4.0–10.5)
nRBC: 0 % (ref 0.0–0.2)

## 2019-08-06 LAB — SYNOVIAL CELL COUNT + DIFF, W/ CRYSTALS
Crystals, Fluid: NONE SEEN
Eosinophils-Synovial: 0 %
Lymphocytes-Synovial Fld: 14 %
Monocyte-Macrophage-Synovial Fluid: 20 %
Neutrophil, Synovial: 66 %
WBC, Synovial: 173 /mm3 (ref 0–200)

## 2019-08-06 LAB — COMPREHENSIVE METABOLIC PANEL
ALT: 66 U/L — ABNORMAL HIGH (ref 0–44)
AST: 37 U/L (ref 15–41)
Albumin: 3.5 g/dL (ref 3.5–5.0)
Alkaline Phosphatase: 87 U/L (ref 38–126)
Anion gap: 10 (ref 5–15)
BUN: 16 mg/dL (ref 6–20)
CO2: 24 mmol/L (ref 22–32)
Calcium: 9.1 mg/dL (ref 8.9–10.3)
Chloride: 101 mmol/L (ref 98–111)
Creatinine, Ser: 0.68 mg/dL (ref 0.61–1.24)
GFR calc Af Amer: >60 mL/min (ref >60)
GFR calc non Af Amer: >60 mL/min (ref >60)
Glucose, Bld: 108 mg/dL — ABNORMAL HIGH (ref 70–99)
Potassium: 4.1 mmol/L (ref 3.5–5.1)
Sodium: 135 mmol/L (ref 135–145)
Total Bilirubin: 0.9 mg/dL (ref 0.3–1.2)
Total Protein: 7.4 g/dL (ref 6.5–8.1)

## 2019-08-06 LAB — PROTIME-INR
INR: 1 (ref 0.8–1.2)
Prothrombin Time: 13.2 seconds (ref 11.4–15.2)

## 2019-08-06 LAB — APTT: aPTT: 34 seconds (ref 24–36)

## 2019-08-06 LAB — BRAIN NATRIURETIC PEPTIDE: B Natriuretic Peptide: 398 pg/mL — ABNORMAL HIGH (ref 0.0–100.0)

## 2019-08-06 LAB — TSH: TSH: 1.986 u[IU]/mL (ref 0.350–4.500)

## 2019-08-06 LAB — MAGNESIUM: Magnesium: 1.8 mg/dL (ref 1.7–2.4)

## 2019-08-06 LAB — TROPONIN I (HIGH SENSITIVITY): Troponin I (High Sensitivity): 3 ng/L (ref <18)

## 2019-08-06 LAB — T4, FREE: Free T4: 1.26 ng/dL — ABNORMAL HIGH (ref 0.61–1.12)

## 2019-08-06 IMAGING — DX DG KNEE 1-2V*L*
2 series · 2 of 2 positions shown · non-contrast
Comparison: None.

CLINICAL DATA: Knee pain.  No injury.

EXAM:
LEFT KNEE - 1-2 VIEW

[knee ap]
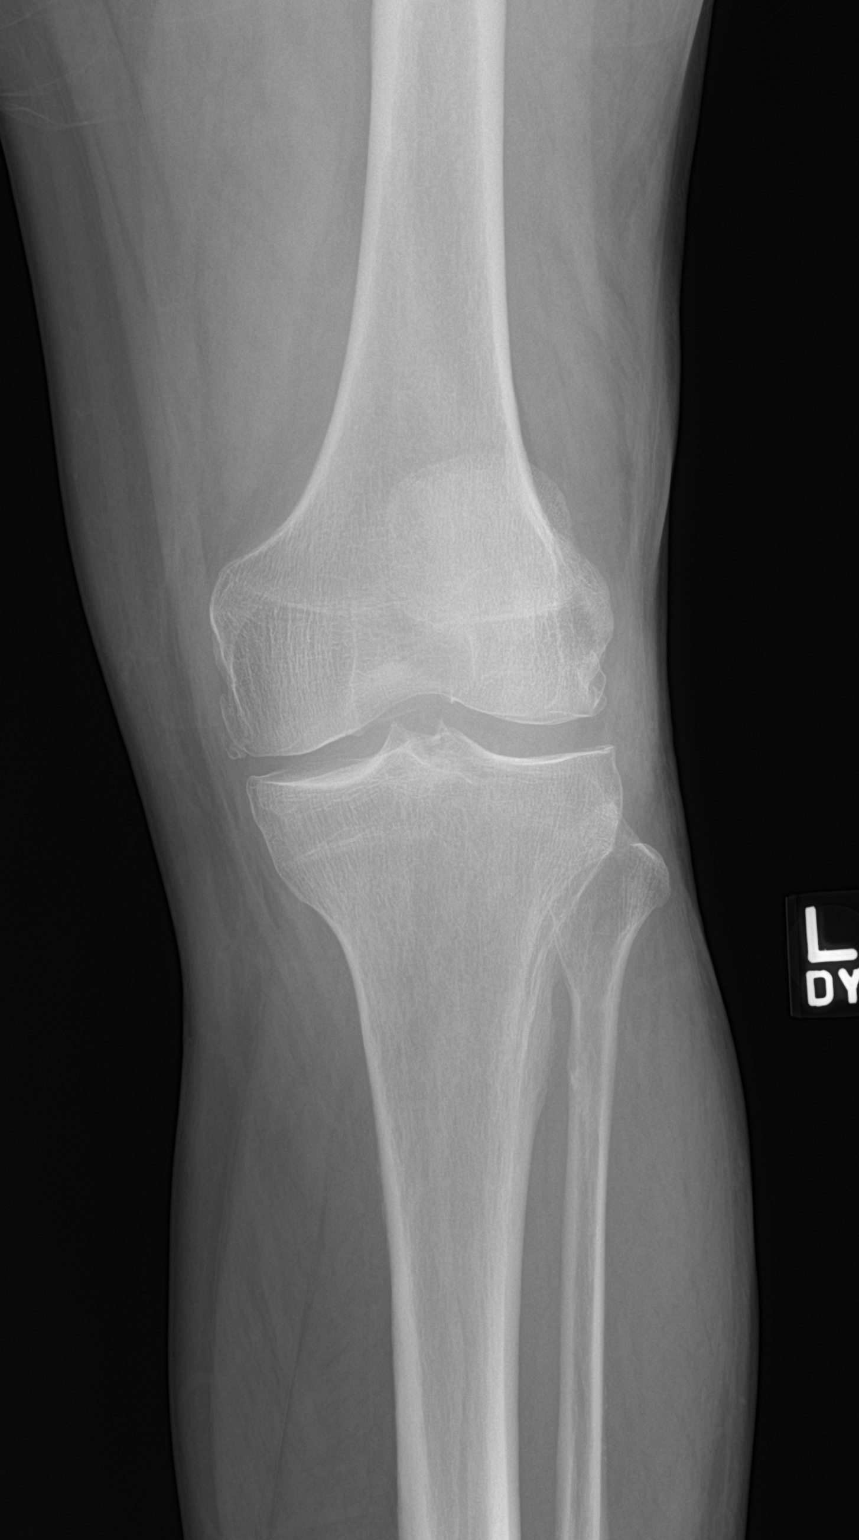

[knee lat]
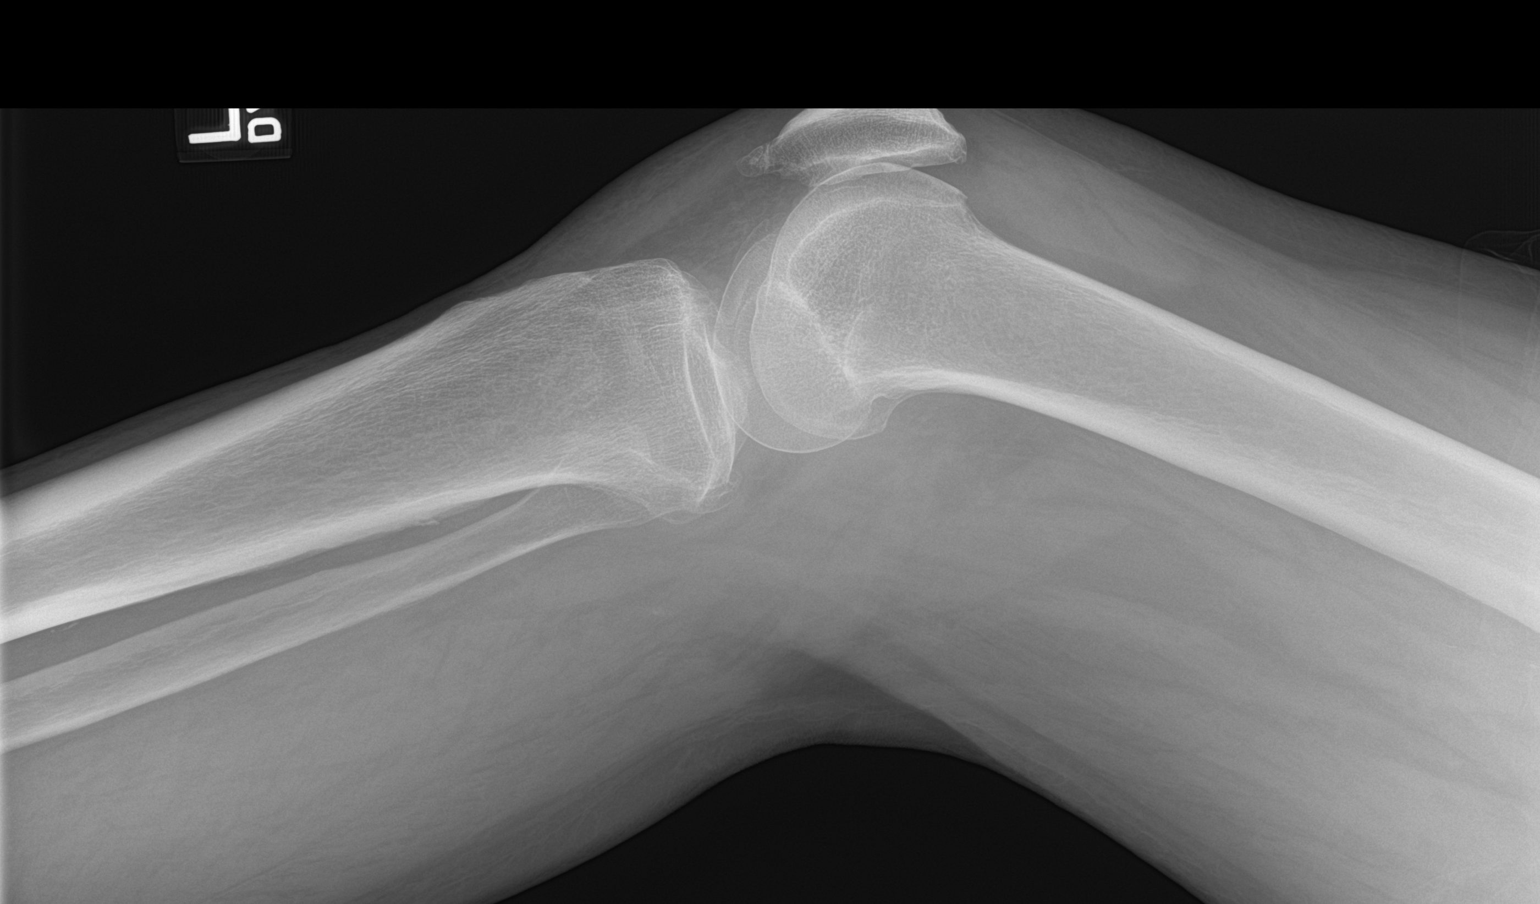

[2 of 2 positions shown; findings below may reference images not displayed]

FINDINGS: There is a small to moderate joint effusion. Tricompartmental
degenerative changes are identified. No other acute abnormalities.
IMPRESSION: Small to moderate joint effusion. Tricompartmental degenerative
changes.

## 2019-08-06 IMAGING — US US EXTREM LOW VENOUS*L*
1 series · 13 of 24 positions shown · non-contrast
Comparison: [DATE]

CLINICAL DATA: Left lower extremity pain and



[Series 1: us extrem low venous*left* · 53 acquisitions, 13 frames shown]
[im 1/53]
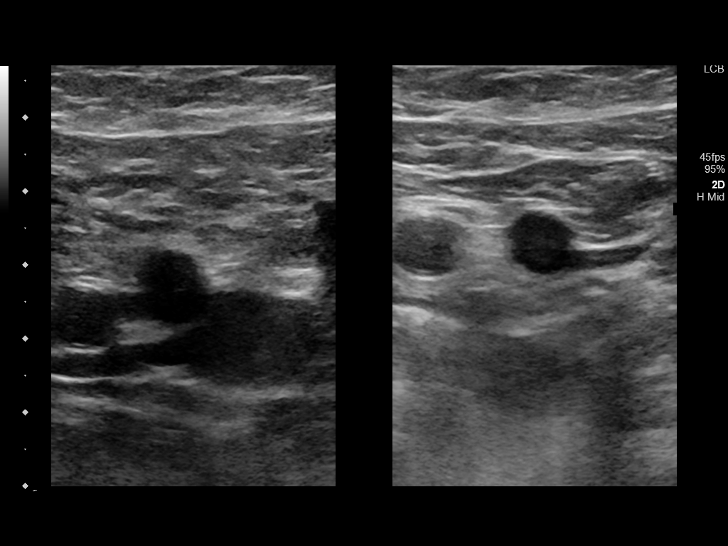
[im 5/53]
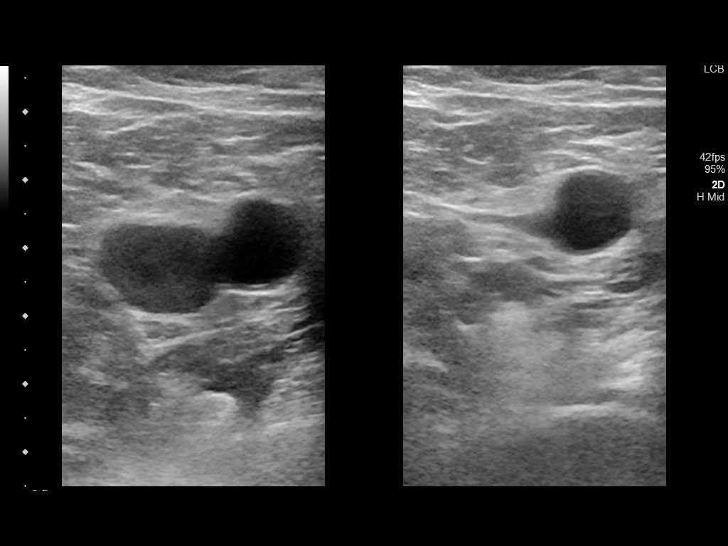
[im 10/53]
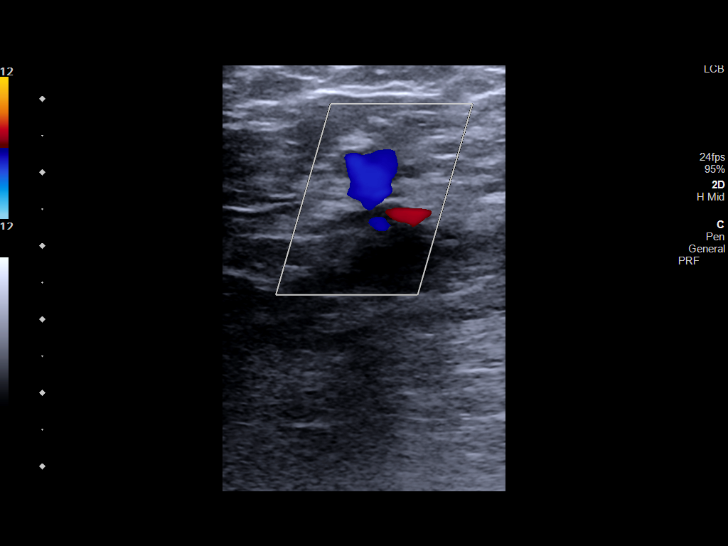
[im 14/53]
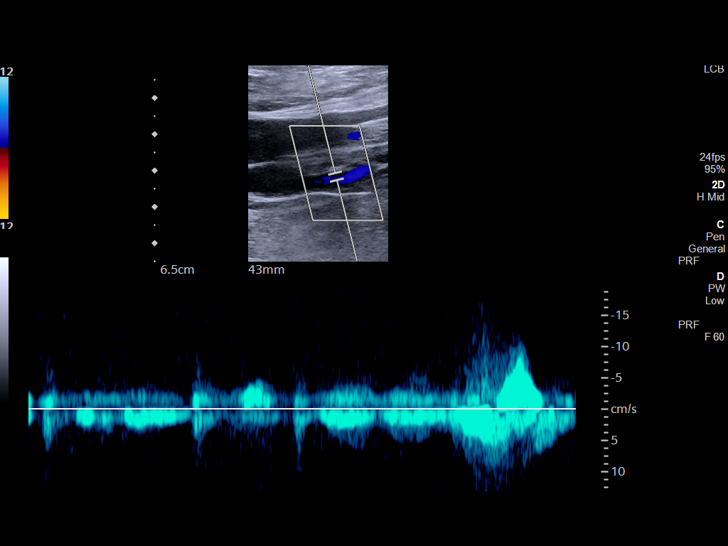
[im 19/53]
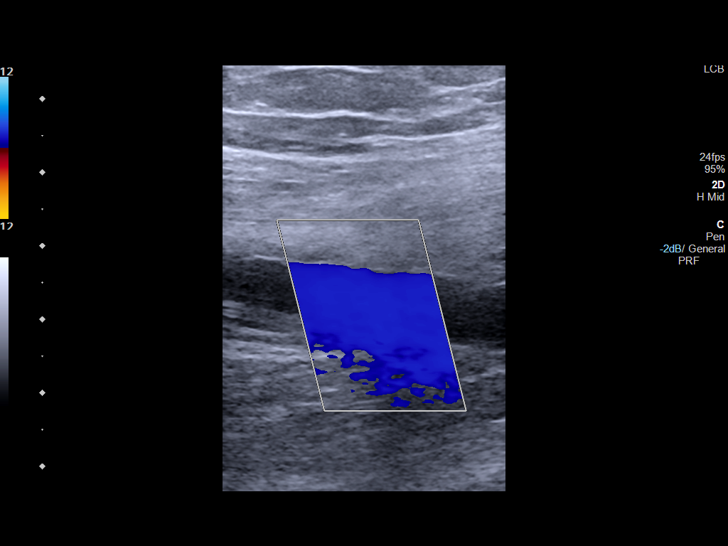
[im 23/53]
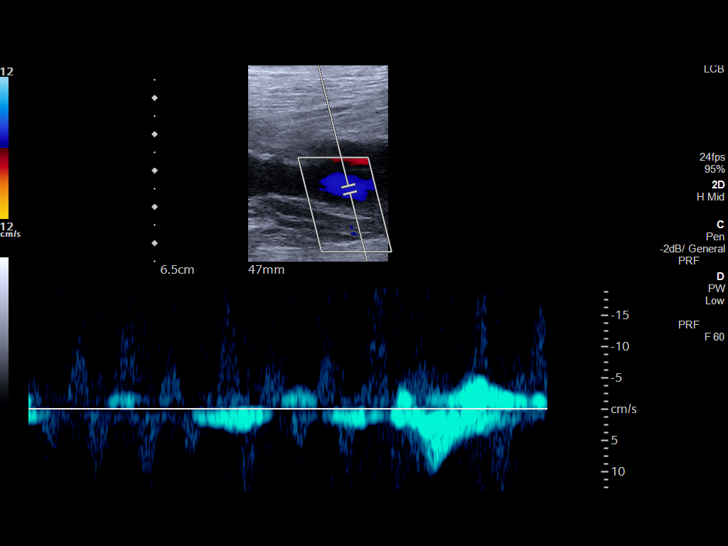
[im 30/53]
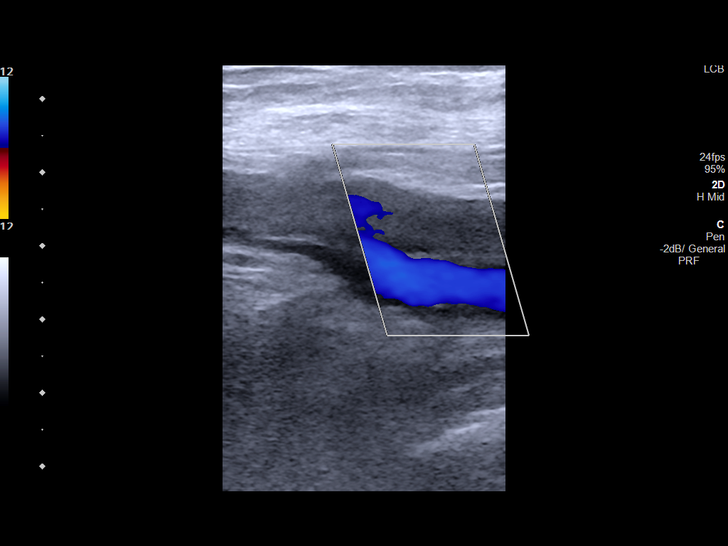
[im 32/53]
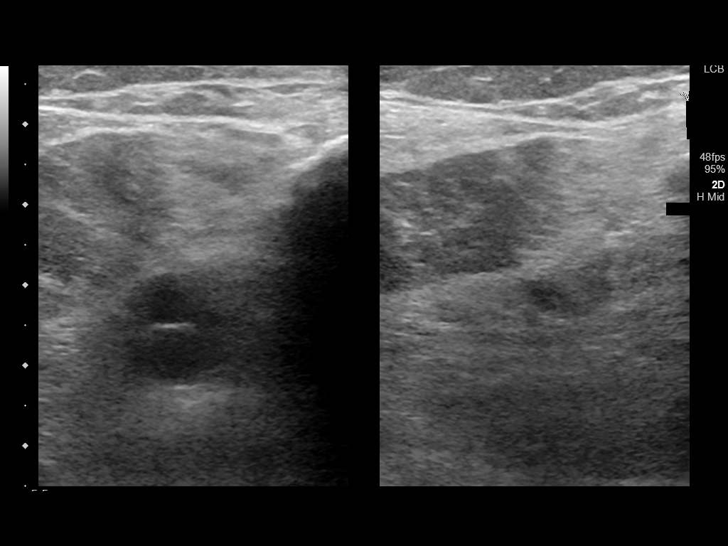
[im 37/53]
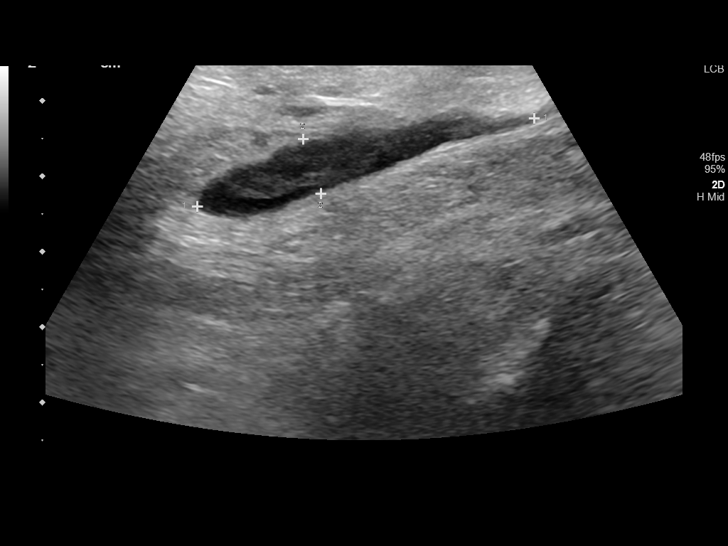
[im 41/53]
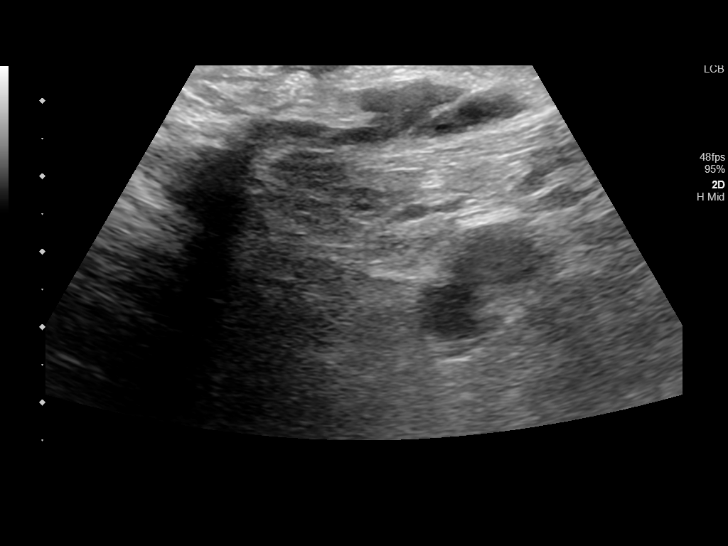
[im 43/53]
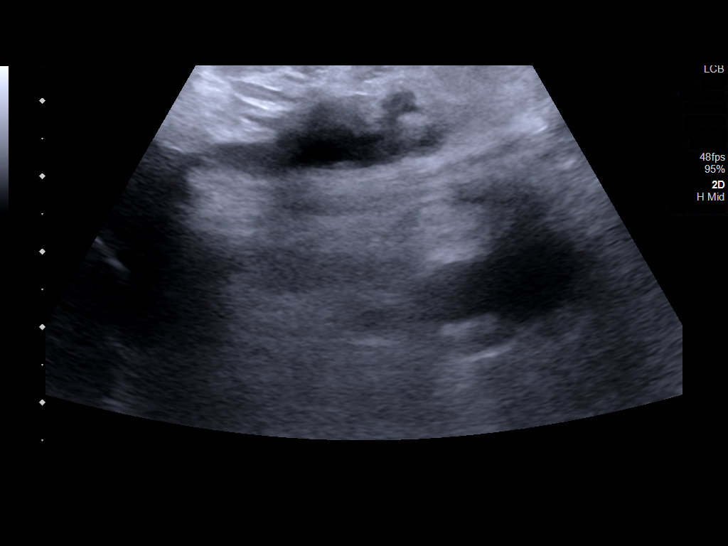
[im 48/53]
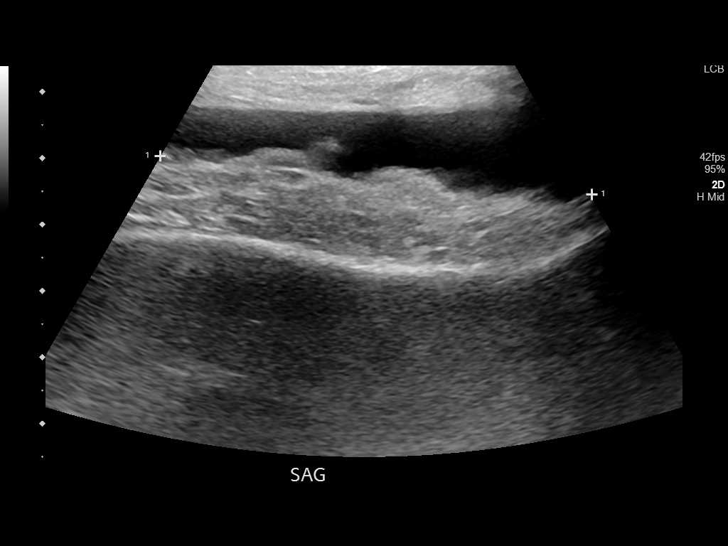
[im 53/53]
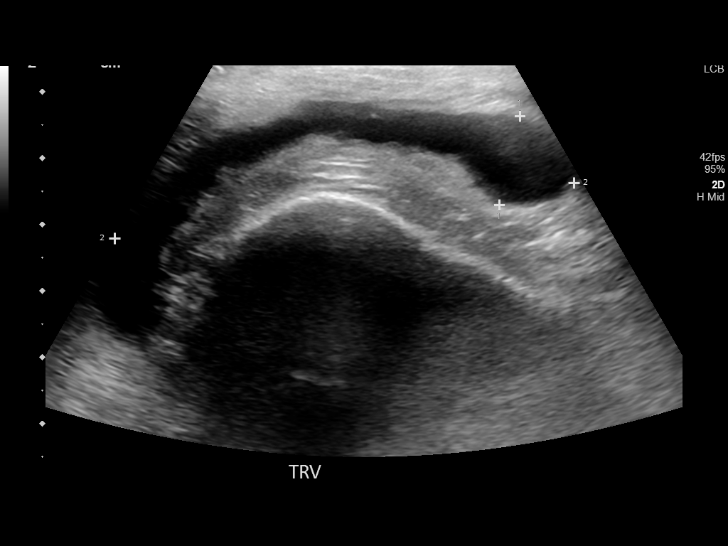

[13 of 24 positions shown; findings below may reference images not displayed]

FINDINGS: Contralateral Common Femoral Vein: Respiratory phasicity is normal
and symmetric with the symptomatic side. No evidence of thrombus.
Normal compressibility.

Common Femoral Vein: No evidence of thrombus. Normal
compressibility, respiratory phasicity and response to augmentation.

Saphenofemoral Junction: No evidence of thrombus. Normal
compressibility and flow on color Doppler imaging.

Profunda Femoral Vein: No evidence of thrombus. Normal
compressibility and flow on color Doppler imaging.

Femoral Vein: No evidence of thrombus. Normal compressibility,
respiratory phasicity and response to augmentation.

Popliteal Vein: No evidence of thrombus. Normal compressibility,
respiratory phasicity and response to augmentation.

Calf Veins: No evidence of thrombus. Normal compressibility and flow
on color Doppler imaging.

Other Findings: Left popliteal fossa complex thick-walled Baker's
cyst measures 4.6 x 0.8 x 2.7 cm. Moderate to large left knee
effusion also evident.
IMPRESSION: Negative for significant left lower extremity DVT.

4.6 cm chronic Baker's cyst

Left knee joint effusion.

## 2019-08-06 IMAGING — DX DG CHEST 1V PORT
1 series · 1 of 1 positions shown · non-contrast
Comparison: [DATE]

CLINICAL DATA: Shortness of breath

EXAM:
PORTABLE CHEST 1 VIEW

[chest ap]
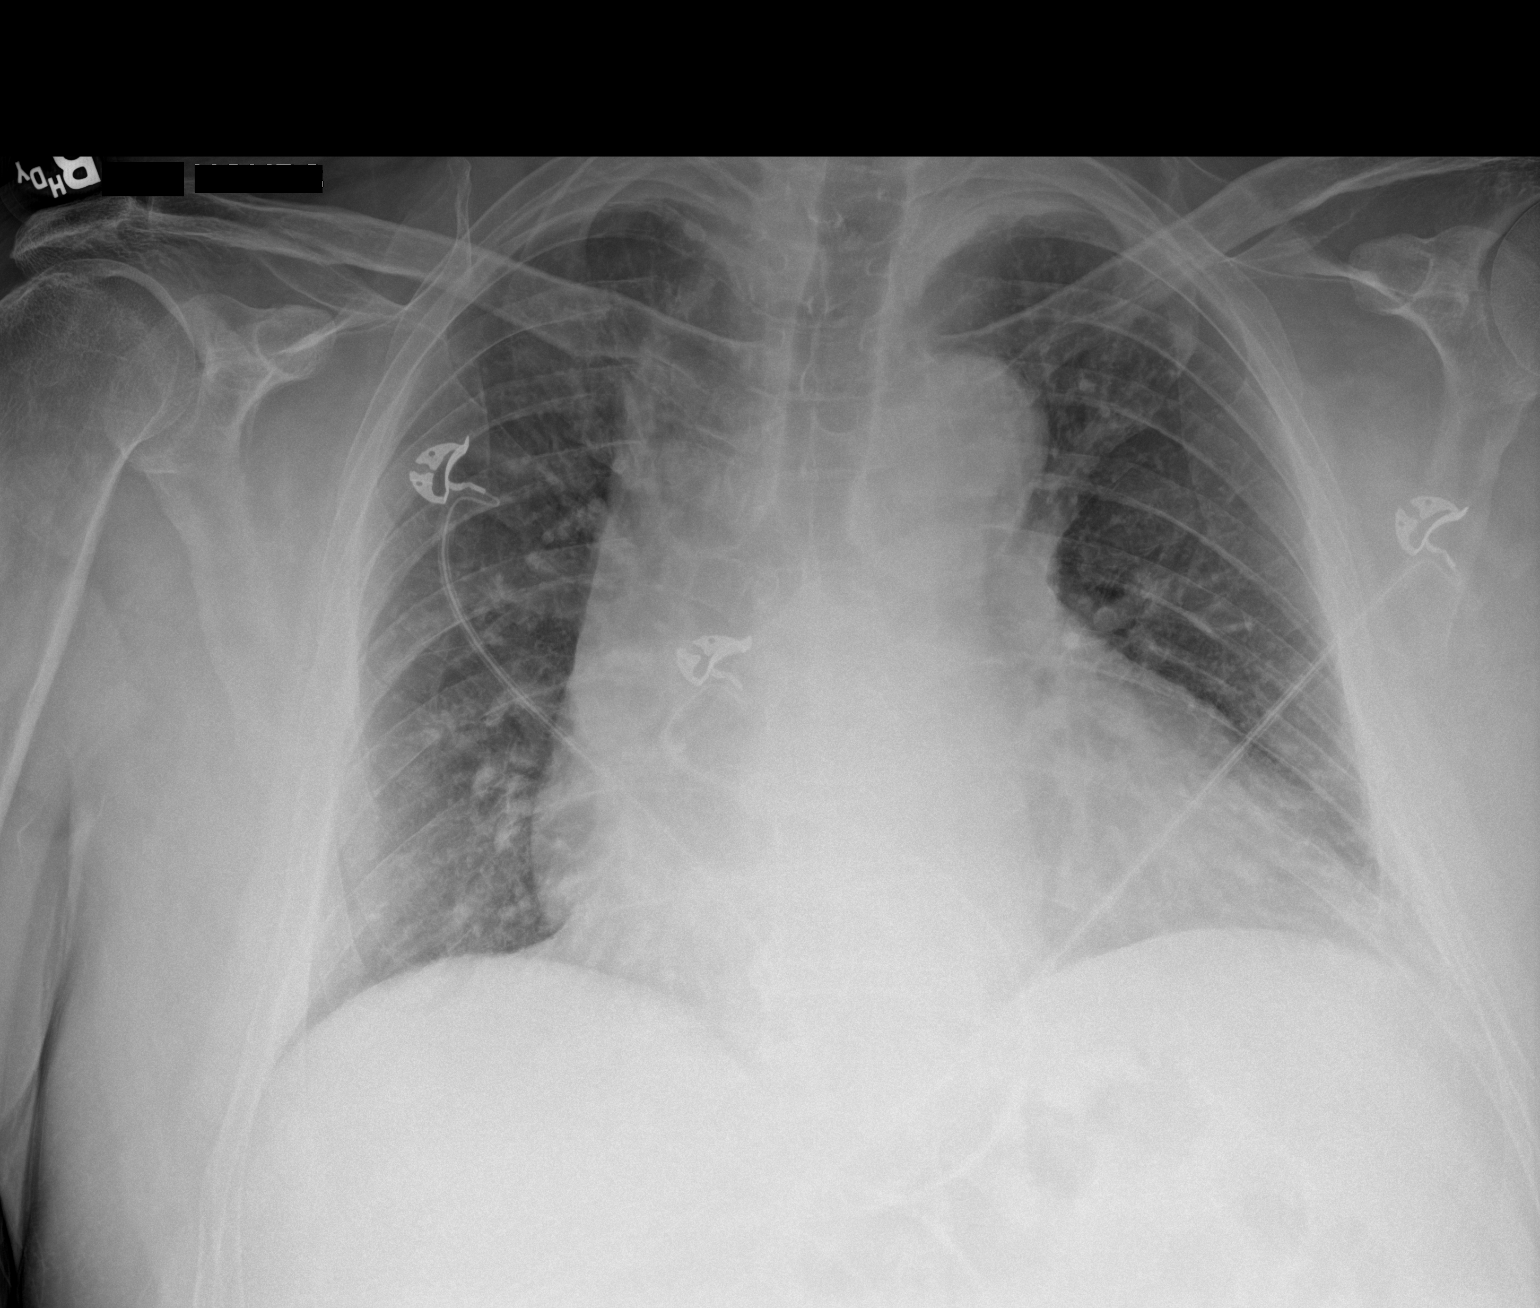

[1 of 1 positions shown; findings below may reference images not displayed]

FINDINGS: No edema or consolidation. There is cardiomegaly with pulmonary
vascularity normal. No adenopathy. No bone lesions.
IMPRESSION: Cardiomegaly.  No edema or consolidation.

## 2019-08-06 MED ORDER — OXYCODONE-ACETAMINOPHEN 5-325 MG PO TABS
1.0000 | ORAL_TABLET | Freq: Once | ORAL | Status: AC
  Administered 2019-08-06: 1 via ORAL
  Filled 2019-08-06: qty 1

## 2019-08-06 MED ORDER — ONDANSETRON HCL 4 MG/2ML IJ SOLN
4.0000 mg | Freq: Once | INTRAMUSCULAR | Status: AC
  Administered 2019-08-06: 4 mg via INTRAVENOUS
  Filled 2019-08-06: qty 2

## 2019-08-06 MED ORDER — ETOMIDATE 2 MG/ML IV SOLN
INTRAVENOUS | Status: AC
  Administered 2019-08-06: 10 mg via INTRAVENOUS
  Filled 2019-08-06: qty 10

## 2019-08-06 MED ORDER — OXYCODONE HCL 5 MG PO TABS: 5.0000 mg | ORAL_TABLET | Freq: Three times a day (TID) | ORAL | 0 refills | Status: DC | PRN

## 2019-08-06 MED ORDER — ETOMIDATE 2 MG/ML IV SOLN: 10.0000 mg | Freq: Once | INTRAVENOUS | Status: AC

## 2019-08-06 MED ORDER — LIDOCAINE HCL (PF) 1 % IJ SOLN: 15.0000 mL | Freq: Once | INTRAMUSCULAR | Status: AC

## 2019-08-06 MED ORDER — HYDROMORPHONE HCL 1 MG/ML IJ SOLN
1.0000 mg | Freq: Once | INTRAMUSCULAR | Status: AC
  Administered 2019-08-06: 13:00:00 1 mg via INTRAVENOUS
  Filled 2019-08-06: qty 1

## 2019-08-06 MED ORDER — ETOMIDATE 2 MG/ML IV SOLN
INTRAVENOUS | Status: AC | PRN
  Administered 2019-08-06: 5 mg via INTRAVENOUS

## 2019-08-06 MED ORDER — LIDOCAINE HCL (PF) 1 % IJ SOLN
INTRAMUSCULAR | Status: AC
  Administered 2019-08-06: 15 mL
  Filled 2019-08-06: qty 15

## 2019-08-06 NOTE — ED Provider Notes (Addendum)
[facility] Emergency Department Provider Note  ____________________________________________   First MD Initiated Contact with Patient 08/06/19 1034     (approximate)  I have reviewed the triage vital signs and the nursing notes.   HISTORY  Chief Complaint Shortness of Breath and Leg Swelling    HPI Jake Liu is a 59 y.o. male with atrial fibrillation on Xarelto, cardiomyopathy EF of 45% who comes in for leg swelling and shortness of breath.  Patient is status post cardioversion on 12/17 at [facility].  Patient states that he is mostly coming in for left leg pain.  Patient has had pain behind his left knee for approximately 2 weeks.  He states that he was seen by [facility] on 12/28 where they did arthrocentesis and then injected steroids.  Initially he felt better but it seems to have flared up again.  He states that he was helping build a new house for his daughter and was kneeling on it for a while so he thinks that could have flared it up.  He states that he came in today due to noticing more swelling in his ankle which is new from prior.  The pain is severe, constant, worse with moving, better at rest, located behind the knee.  He has a little swelling in ankle and wanted to make sure no evidence of DVT. He does endorse maybe a little bit of shortness of breath and want to make sure that he was not back in atrial fibrillation.  He had an ablation 5 years ago but had a cardioversion done in December.  He states that the cardioversion he is able to easily convert out of the A. fib.  He states that he has been compliant with his metoprolol and his blood thinner.  He has not missed any doses.  He states that he was not able to cardiovert with amiodarone in the past.          Past Medical History:  Diagnosis Date  . Anginal pain ([place])   . Arthritis   . Atrial fibrillation ([place])   . Cardiomyopathy ([place])    EF: 45%  . CHF (congestive heart failure)  ([place])   . GERD (gastroesophageal reflux disease)   . History of TIAs   . Hypertension   . Insomnia   . Sciatica   . Sleep apnea     Patient Active Problem List   Diagnosis Date Noted  . Flank pain 08/20/2018  . Fatty liver disease, nonalcoholic [id number]  . Right lower quadrant abdominal pain 08/18/2018  . Erectile dysfunction 03/08/2018  . GAD (generalized anxiety disorder) 03/08/2018  . Bilateral low back pain without sciatica 03/08/2018  . Viral illness 03/24/2016  . Umbilical hernia 0000000  . Hyperlipidemia 07/31/2014  . Obstructive sleep apnea 01/10/2014  . GERD (gastroesophageal reflux disease) 12/07/2013  . Chest pain 01/25/2013  . Atrial fibrillation status post cardioversion ([place]) 01/12/2012  . Hypertension 01/12/2012  . Insomnia 01/12/2012    Past Surgical History:  Procedure Laterality Date  . ABLATION N/A    for af  . COLONOSCOPY    . COLONOSCOPY WITH PROPOFOL N/A 10/13/2018   Procedure: COLONOSCOPY WITH PROPOFOL;  Surgeon: Toledo, [name], MD;  Location: [facility] ENDOSCOPY;  Service: Gastroenterology;  Laterality: N/A;  . ESOPHAGOGASTRODUODENOSCOPY    . ESOPHAGOGASTRODUODENOSCOPY (EGD) WITH PROPOFOL N/A 10/13/2018   Procedure: ESOPHAGOGASTRODUODENOSCOPY (EGD) WITH PROPOFOL;  Surgeon: Toledo, [name], MD;  Location: [facility] ENDOSCOPY;  Service: Gastroenterology;  Laterality: N/A;  . HERNIA  REPAIR    . KNEE SURGERY    . ROTATOR CUFF REPAIR      Prior to Admission medications   Medication Sig Start Date End Date Taking? Authorizing Provider  amLODipine (NORVASC) 5 MG tablet TAKE 1 TABLET(5 MG) BY MOUTH DAILY 03/28/19   Burnard Hawthorne, FNP  atorvastatin (LIPITOR) 80 MG tablet Take 1 tablet (80 mg total) by mouth daily. 09/03/18 12/02/18  Minna Merritts, MD  metoprolol succinate (TOPROL-XL) 50 MG 24 hr tablet Take 1 tablet (50 mg total) by mouth daily. Take with or immediately following a meal. 01/14/13   Jackolyn Confer, MD  omeprazole (PRILOSEC) 40 MG  capsule TAKE 1 CAPSULE(40 MG) BY MOUTH DAILY 07/06/19   Burnard Hawthorne, FNP  rivaroxaban (XARELTO) 20 MG TABS tablet Take by mouth. 04/01/13   [provider]    Allergies Sulfa antibiotics, Sulfasalazine, Bactrim [sulfamethoxazole-trimethoprim], and Lisinopril  Family History  Problem Relation Age of Onset  . Heart disease Mother 6  . Asthma Father     Social History Social History   Tobacco Use  . Smoking status: Former Research scientist (life sciences)  . Smokeless tobacco: Never Used  Substance Use Topics  . Alcohol use: Yes    Alcohol/week: 14.0 standard drinks    Types: 7 Glasses of wine, 7 Standard drinks or equivalent per week    Comment: drinks at night., bottle and half of red wine;  Drinks 10 cups coffee daily  . Drug use: No      Review of Systems Constitutional: No fever/chills Eyes: No visual changes. ENT: No sore throat. Cardiovascular: No chest pain Respiratory: Positive for SOB Gastrointestinal: No abdominal pain.  No nausea, no vomiting.  No diarrhea.  No constipation. Genitourinary: Negative for dysuria. Musculoskeletal: Negative for back pain. + knee pain, ankle swelling  Skin: Negative for rash. Neurological: Negative for headaches, focal weakness or numbness. All other ROS negative ____________________________________________   PHYSICAL EXAM:  VITAL SIGNS: ED Triage Vitals [08/06/19 1019]  Enc Vitals Group     BP (!) 152/92     Pulse Rate 71     Resp 18     Temp (!) 97.5 F (36.4 C)     Temp Source Oral     SpO2 98 %     Weight      Height      Head Circumference      Peak Flow      Pain Score      Pain Loc      Pain Edu?      Excl. in [place]?     Constitutional: Alert and oriented. Well appearing and in no acute distress. Eyes: Conjunctivae are normal. EOMI. Head: Atraumatic. Nose: No congestion/rhinnorhea. Mouth/Throat: Mucous membranes are moist.   Neck: No stridor. Trachea Midline. FROM Cardiovascular: Irregular and fast rhythm grossly  normal heart sounds.  Good peripheral circulation. Respiratory: No increased work of breathing, clear lungs Gastrointestinal: Soft and nontender. No distention. No abdominal bruits.  Musculoskeletal: Tenderness of the left knee.  No warmth or erythema.  Decreased range of motion secondary to pain.  Most the pain is underneath the knee.  2+ distal pulse.  Soft tissue Swelling around his ankle. No warmth no erythema  Neurologic:  Normal speech and language. No gross focal neurologic deficits are appreciated.  Skin:  Skin is warm, dry and intact. No rash noted. Psychiatric: Mood and affect are normal. Speech and behavior are normal. GU: Deferred   ____________________________________________   LABS (all  labs ordered are listed, but only abnormal results are displayed)  Labs Reviewed  CBC WITH DIFFERENTIAL/PLATELET - Abnormal; Notable for the following components:      Result Value   WBC 12.8 (*)    Neutro Abs 9.8 (*)    Monocytes Absolute 1.4 (*)    All other components within normal limits  COMPREHENSIVE METABOLIC PANEL - Abnormal; Notable for the following components:   Glucose, Bld 108 (*)    ALT 66 (*)    All other components within normal limits  T4, FREE - Abnormal; Notable for the following components:   Free T4 1.26 (*)    All other components within normal limits  BRAIN NATRIURETIC PEPTIDE - Abnormal; Notable for the following components:   B Natriuretic Peptide 398.0 (*)    All other components within normal limits  SYNOVIAL CELL COUNT + DIFF, W/ CRYSTALS - Abnormal; Notable for the following components:   Color, Synovial RED (*)    Appearance-Synovial CLOUDY (*)    All other components within normal limits  BODY FLUID CULTURE  MAGNESIUM  TSH  PROTIME-INR  APTT  GLUCOSE, BODY FLUID OTHER  PROTEIN, BODY FLUID (OTHER)  TROPONIN I (HIGH SENSITIVITY)   ____________________________________________   ED ECG REPORT I, Vanessa Loomis, the attending physician,  personally viewed and interpreted this ECG.  EKG shows what looks like an atrial fibrillation with rate of 159, no ST elevation, T wave inversion in aVL, normal interval  ____________________________________________  RADIOLOGY Robert Bellow, personally viewed and evaluated these images (plain radiographs) as part of my medical decision making, as well as reviewing the written report by the radiologist.  ED MD interpretation:  Moderate joint effusion   Official radiology report(s): DG Knee 2 Views Left  Result Date: 08/06/2019 CLINICAL DATA:  Knee pain.  No injury. EXAM: LEFT KNEE - 1-2 VIEW COMPARISON:  None. FINDINGS: There is a small to moderate joint effusion. Tricompartmental degenerative changes are identified. No other acute abnormalities. IMPRESSION: Small to moderate joint effusion. Tricompartmental degenerative changes. Electronically Signed   By: Dorise Bullion III M.D   On: 08/06/2019 12:39   US Venous Img Lower Unilateral Left  Result Date: 08/06/2019 CLINICAL DATA:  Left lower extremity pain and EXAM: LEFT LOWER EXTREMITY VENOUS DOPPLER ULTRASOUND TECHNIQUE: Gray-scale sonography with graded compression, as well as color Doppler and duplex ultrasound were performed to evaluate the lower extremity deep venous systems from the level of the common femoral vein and including the common femoral, femoral, profunda femoral, popliteal and calf veins including the posterior tibial, peroneal and gastrocnemius veins when visible. The superficial great saphenous vein was also interrogated. Spectral Doppler was utilized to evaluate flow at rest and with distal augmentation maneuvers in the common femoral, femoral and popliteal veins. COMPARISON:  04/20/2016 FINDINGS: Contralateral Common Femoral Vein: Respiratory phasicity is normal and symmetric with the symptomatic side. No evidence of thrombus. Normal compressibility. Common Femoral Vein: No evidence of thrombus. Normal compressibility,  respiratory phasicity and response to augmentation. Saphenofemoral Junction: No evidence of thrombus. Normal compressibility and flow on color Doppler imaging. Profunda Femoral Vein: No evidence of thrombus. Normal compressibility and flow on color Doppler imaging. Femoral Vein: No evidence of thrombus. Normal compressibility, respiratory phasicity and response to augmentation. Popliteal Vein: No evidence of thrombus. Normal compressibility, respiratory phasicity and response to augmentation. Calf Veins: No evidence of thrombus. Normal compressibility and flow on color Doppler imaging. Other Findings: Left popliteal fossa complex thick-walled Baker's cyst measures 4.6 x 0.8  x 2.7 cm. Moderate to large left knee effusion also evident. IMPRESSION: Negative for significant left lower extremity DVT. 4.6 cm chronic Baker's cyst Left knee joint effusion. Electronically Signed   By: Jerilynn Mages.  Shick M.D.   On: 08/06/2019 12:24   DG Chest Portable 1 View  Result Date: 08/06/2019 CLINICAL DATA:  Shortness of breath EXAM: PORTABLE CHEST 1 VIEW COMPARISON:  February 22, 2018 FINDINGS: No edema or consolidation. There is cardiomegaly with pulmonary vascularity normal. No adenopathy. No bone lesions. IMPRESSION: Cardiomegaly.  No edema or consolidation. Electronically Signed   By: Lowella Grip III M.D.   On: 08/06/2019 11:34    ____________________________________________   PROCEDURES  Procedure(s) performed (including Critical Care):  .Sedation  Date/Time: 08/06/2019 5:32 PM Performed by: Vanessa [place], MD Authorized by: Vanessa [place], MD   Consent:    Consent obtained:  Verbal   Consent given by:  Patient   Risks discussed:  Allergic reaction, dysrhythmia, inadequate sedation, nausea, prolonged hypoxia resulting in organ damage, prolonged sedation necessitating reversal, respiratory compromise necessitating ventilatory assistance and intubation and vomiting   Alternatives discussed:  Analgesia without sedation,  anxiolysis and regional anesthesia Universal protocol:    Procedure explained and questions answered to patient or proxy's satisfaction: yes     Relevant documents present and verified: yes     Test results available and properly labeled: yes     Imaging studies available: yes     Required blood products, implants, devices, and special equipment available: yes     Site/side marked: yes     Immediately prior to procedure a time out was called: yes     Patient identity confirmation method:  Verbally with patient Indications:    Procedure performed:  Cardioversion   Procedure necessitating sedation performed by:  Physician performing sedation Pre-sedation assessment:    Time since last food or drink:  8AM   ASA classification: class 1 - normal, healthy patient     Neck mobility: normal     Mouth opening:  3 or more finger widths   Thyromental distance:  4 finger widths   Mallampati score:  I - soft palate, uvula, fauces, pillars visible   Pre-sedation assessments completed and reviewed: airway patency, cardiovascular function, hydration status, mental status, nausea/vomiting, pain level, respiratory function and temperature     Pre-sedation assessment completed:  08/06/2019 1:33 PM Immediate pre-procedure details:    Reassessment: Patient reassessed immediately prior to procedure     Reviewed: vital signs, relevant labs/tests and NPO status     Verified: bag valve mask available, emergency equipment available, intubation equipment available, IV patency confirmed, oxygen available and suction available   Procedure details (see MAR for exact dosages):    Preoxygenation:  Nasal cannula   Sedation:  Etomidate   Intended level of sedation: deep   Intra-procedure monitoring:  Blood pressure monitoring, cardiac monitor, continuous pulse oximetry, frequent LOC assessments, frequent vital sign checks and continuous capnometry   Intra-procedure events: none     Total Provider sedation time  (minutes):  15 Post-procedure details:    Attendance: Constant attendance by certified staff until patient recovered     Recovery: Patient returned to pre-procedure baseline     Post-sedation assessments completed and reviewed: airway patency, cardiovascular function, hydration status, mental status, nausea/vomiting, pain level, respiratory function and temperature     Patient is stable for discharge or admission: yes     Patient tolerance:  Tolerated well, no immediate complications .Cardioversion  Date/Time: 08/06/2019 5:33  PM Performed by: Vanessa [place], MD Authorized by: Vanessa [place], MD   Consent:    Consent obtained:  Written   Consent given by:  Patient   Risks discussed:  Cutaneous burn, death, induced arrhythmia and pain   Alternatives discussed:  No treatment Pre-procedure details:    Cardioversion basis:  Elective   Rhythm:  Atrial fibrillation   Electrode placement:  Anterior-lateral Patient sedated: Yes. Refer to sedation procedure documentation for details of sedation.  Attempt one:    Cardioversion mode:  Synchronous   Waveform:  Biphasic   Shock (Joules):  200   Shock outcome:  Conversion to normal sinus rhythm Post-procedure details:    Patient status:  Awake   Patient tolerance of procedure:  Tolerated well, no immediate complications .Joint Aspiration/Arthrocentesis  Date/Time: 08/06/2019 5:35 PM Performed by: Vanessa [place], MD Authorized by: Vanessa [place], MD   Consent:    Consent obtained:  Written   Consent given by:  Patient   Risks discussed:  Bleeding, incomplete drainage, infection, nerve damage, poor cosmetic result and pain   Alternatives discussed:  No treatment Location:    Location:  Knee Anesthesia (see MAR for exact dosages):    Anesthesia method:  None Procedure details:    Needle gauge:  20 G   Ultrasound guidance: no     Approach:  Lateral   Aspirate amount:  40   Aspirate characteristics:  Clear and blood-tinged   Steroid  injected: no     Specimen collected: yes   Post-procedure details:    Dressing:  Adhesive bandage and gauze roll   Patient tolerance of procedure:  Tolerated well, no immediate complications .Critical Care Performed by: Vanessa [place], MD Authorized by: Vanessa [place], MD   Critical care provider statement:    Critical care time (minutes):  35   Critical care was necessary to treat or prevent imminent or life-threatening deterioration of the following conditions: afib with rvr.   Critical care was time spent personally by me on the following activities:  Discussions with consultants, evaluation of patient's response to treatment, examination of patient, ordering and performing treatments and interventions, ordering and review of laboratory studies, ordering and review of radiographic studies, pulse oximetry, re-evaluation of patient's condition, obtaining history from patient or surrogate and review of old charts     ____________________________________________   INITIAL IMPRESSION / [place] / ED COURSE   Jerime Gonya was evaluated in Emergency Department on 08/06/2019 for the symptoms described in the history of present illness. He was evaluated in the context of the global COVID-19 pandemic, which necessitated consideration that the patient might be at risk for infection with the SARS-CoV-2 virus that causes COVID-19. Institutional protocols and algorithms that pertain to the evaluation of patients at risk for COVID-19 are in a state of rapid change based on information released by regulatory bodies including the CDC and federal and state organizations. These policies and algorithms were followed during the patient's care in the ED.    Patient presents with most notable left leg pain and swelling.  Patient has good distal pulse I doubt that this is secondary to ischemic limb given it is very warm in nature with no color changes and his pain has been going on 2 weeks and more  around the knee that had recent arthrocentesis.  Will get ultrasound to evaluate for DVT although again patient is on a blood thinner which is reassuring.  Possible the swelling is secondary  to him being back in A. fib.  Patient was seen by [facility] 10 days ago and already did arthrocentesis.  I cannot see if there was any evidence of sample sent to evaluate for septic knee.  The knee does not appear septic although he is having limited range of motion secondary to the pain.  Patient was incidentally found to be in new A. fib with RVR.  He is unsure exactly when he went back into it.  We discussed options including rate controlling versus rhythm control.  Patient states that the amiodarone was not effective and that the most effective was the cardioversion that he had done back in December.  Patient's been very compliant with his blood thinner.  Did not require TEE last time what I can tell.  We discussed the risk including stroke and patient was okay with proceeding with cardioversion.  We will get labs first to make sure there is no signs of electrolyte abnormalities, AKI, ACS.  Labs are reassuring slightly elevated t4 but no signs fo thyroid storm do not think cause of afib can f/u with PCP.  DVT ultrasound was negative for blood clot.  X-ray does show a large effusion.  Discussed with [facility] Dr. Mack Guise who agrees with attempting to tap the knee for relief in pain since it helped last time and having him follow-up with the clinic on Monday.  Cardioversion was performed under etomidate sedation patient on back into normal sinus.  Knee was tapped.  Patient handed off to oncoming team pending knee aspiration results.  If negative will plan to discharge home with a short course of oxycodone to take for follow-up with Ortho.  Given crutches to use as needed.     ____________________________________________   FINAL CLINICAL IMPRESSION(S) / ED DIAGNOSES   Final diagnoses:  Knee effusion, left    Atrial fibrillation with RVR ([place])     MEDICATIONS GIVEN DURING THIS VISIT:  Medications  HYDROmorphone (DILAUDID) injection 1 mg (1 mg Intravenous Given 08/06/19 1256)  ondansetron (ZOFRAN) injection 4 mg (4 mg Intravenous Given 08/06/19 1256)  etomidate (AMIDATE) injection 10 mg (10 mg Intravenous Given by Other 08/06/19 1432)  lidocaine (PF) (XYLOCAINE) 1 % injection 15 mL (15 mLs Other Given by Other 08/06/19 1425)  etomidate (AMIDATE) injection (5 mg Intravenous Given 08/06/19 1436)  oxyCODONE-acetaminophen (PERCOCET/ROXICET) 5-325 MG per tablet 1 tablet (1 tablet Oral Given 08/06/19 1537)     ED Discharge Orders         Ordered    oxyCODONE (ROXICODONE) 5 MG immediate release tablet  Every 8 hours PRN     08/06/19 1523           Note:  This document was prepared using Dragon voice recognition software and may include unintentional dictation errors.   Vanessa [place], MD 08/07/19 [id number]    Vanessa [place], MD 08/07/19 [phone]

## 2019-08-06 NOTE — Sedation Documentation (Signed)
Cardioversion done at 200J, synced.

## 2019-08-06 NOTE — ED Triage Notes (Signed)
Pt to ED via POV c/o left lower leg swelling and shortness of breath. Pt having difficulty walking. Pt has significant swelling and pain in the left leg. Pt states that pain has been there for 3 weeks, he had fluid drained from knee recently. Pt is tachy on triage

## 2019-08-06 NOTE — Discharge Instructions (Addendum)
We cardioverted you back into atrial fibrillation.  You should follow-up with your cardiologist to let them know that this happened again.  For your knee pain take Tylenol 1 g every 8 hours.  Take the oxycodone for breakthrough pain.  [facility] to [facility] for your follow-up.  Return to the ER for worsening pain, fevers, redness on your knee or any other concerns

## 2019-08-06 NOTE — ED Notes (Signed)
Pt give dc instructions, verbalized understanding.  IV d/c,assisted into home clothes.  pt given crutches with instructions and demonstrated understanding.  Assisted into wc and take out of ed without incident.

## 2019-08-06 NOTE — ED Provider Notes (Signed)
Synovial fluid testing preliminarily resulted.  No crystals seen.  Less than 200 WBC count, neutrophil percentage 66%, likely inflammation 2/2 arthritis, definitively less than 90% - no indication of septic joint.  Updated patient on his results. Remains in NSR. He will be discharged with crutches, follow-up with his cardiologist and orthopedics.  Patient voices understanding and is comfortable with the plan and discharge.   Lilia Pro., MD 08/06/19 1719

## 2019-08-07 LAB — PROTEIN, BODY FLUID (OTHER): Total Protein, Body Fluid Other: 2.9 g/dL

## 2019-08-07 LAB — GLUCOSE, BODY FLUID OTHER: Glucose, Body Fluid Other: 94 mg/dL

## 2019-08-09 LAB — BODY FLUID CULTURE: Culture: NO GROWTH

## 2019-08-16 DIAGNOSIS — M7122 Synovial cyst of popliteal space [Baker], left knee: Secondary | ICD-10-CM | POA: Insufficient documentation

## 2019-08-23 ENCOUNTER — Telehealth: Payer: Self-pay | Admitting: Family

## 2019-08-23 NOTE — Telephone Encounter (Signed)
Pt wants to speak with PCP about frustrations with orthopedic urgent care. He would like clarification about a few things. Please advise.

## 2019-08-23 NOTE — Telephone Encounter (Signed)
I spoke with patient & he was very concerned. He had a Baker's cyst that burst on his knee right before Christmas. He has been to see Dr. Janace Hoard at [facility] wasn't particularly pleased. He has been to the ED on 1/9 because he was in so much pain from his knee & it also threw him into aFib. He is very concerned also about the pain he is having in his groin area. He really wants to see you in person & have labs done. He has lost over 25lbs in two weeks which also has scared him. The only appointment I have & will put him in is @ 12p on 2/1. I just need to know if screening is passed that he can come in office?

## 2019-08-24 NOTE — Telephone Encounter (Signed)
Call pt Noted all  Yes he can come in

## 2019-08-24 NOTE — Telephone Encounter (Signed)
I called and spoke with patient again to let him know that he could come into the office 2/1 as long as screening is still passed. He is going to [facility] walk-in [facility] today because Dr. Harlow Mares has basically said that there is nothing else that can be done for him there. He also had blood work done with them yesterday but has has not heard back yet. He said that Dr. Harlow Mares also has him seeing rheumatology next week. He is beside himself at this point & said that his knee is more swollen than it's ever been. He can't move his shoulders & his groin pain is awful. He said that luckily his daughter is there to help him up & down out of his chair at home. He said when he sets too  long he gets so stiff. He will call & update [place] after he sees [facility] today.

## 2019-08-25 ENCOUNTER — Telehealth: Payer: Self-pay | Admitting: Family

## 2019-08-25 NOTE — Telephone Encounter (Signed)
Pt states that his muscles are locking up and it feels like they are ripping. He has an appt with Rheumatology on Thursday but thinks that is too long to wait. He says the pain is making his heart race and his afib is acting up. Does not want to be triaged. Wants a call back from PCP

## 2019-08-26 ENCOUNTER — Telehealth: Payer: Self-pay | Admitting: Family

## 2019-08-26 ENCOUNTER — Encounter: Payer: Self-pay | Admitting: Family

## 2019-08-26 ENCOUNTER — Ambulatory Visit: Payer: Commercial Managed Care - PPO | Admitting: Family

## 2019-08-26 ENCOUNTER — Other Ambulatory Visit: Payer: Self-pay

## 2019-08-26 ENCOUNTER — Ambulatory Visit (INDEPENDENT_AMBULATORY_CARE_PROVIDER_SITE_OTHER): Payer: Commercial Managed Care - PPO | Admitting: Family

## 2019-08-26 VITALS — Ht 72.0 in | Wt 206.0 lb

## 2019-08-26 DIAGNOSIS — G8929 Other chronic pain: Secondary | ICD-10-CM | POA: Diagnosis not present

## 2019-08-26 DIAGNOSIS — M791 Myalgia, unspecified site: Secondary | ICD-10-CM | POA: Diagnosis not present

## 2019-08-26 DIAGNOSIS — I4891 Unspecified atrial fibrillation: Secondary | ICD-10-CM | POA: Diagnosis not present

## 2019-08-26 DIAGNOSIS — M25562 Pain in left knee: Secondary | ICD-10-CM | POA: Insufficient documentation

## 2019-08-26 NOTE — Assessment & Plan Note (Signed)
Recently appears to be NSR.  Symptoms are mild,however seem to be exacerbated by knee pain.  Patient continues to be on Xarelto, he is following with [place] cardiology Dr. [name] in regards to this.  Will follow

## 2019-08-26 NOTE — Assessment & Plan Note (Addendum)
Etiology unclear at this time . fever has resolved.  Advised patient to get Covid tested ASAP.  We will also do work-up rhabdomyolysis, electrolyte abnormalities. I have advised him to hold Lipitor. he will let me know how he is  doing; fu on Monday.

## 2019-08-26 NOTE — Telephone Encounter (Signed)
Call pt We may work in him with phone or video visit around 330 today

## 2019-08-26 NOTE — Telephone Encounter (Signed)
I called the patient and per Arnett I scheduled a phone visit today @ 3:30 pm.  Quindarius Cabello,cma

## 2019-08-26 NOTE — Telephone Encounter (Signed)
Patient was never tested for covid, I called and spoke with him and I informed him of the number to call to schedule an appointment to get tested for covid-19.  Joli Koob,cma

## 2019-08-26 NOTE — Telephone Encounter (Signed)
Call pt I meant to ensure that he is not having fever, respiratory symptoms Was he tested for covid at [facility] ED? The notes from their ED are hard to read/follow.  Does he have sxs of covid?

## 2019-08-26 NOTE — Progress Notes (Signed)
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  Started 2 months ago when putting together doll house for granddaughter for Christmas However today his chief complaint is diffuse crampy, muscle tightness which seems to be all over his body. More noticeable over the past several days. No trouble urinating or hematuria.  He continues to describe bilateral groin pain, shoulder pain. Hard to lift arms/hands. Muscles feel 'tight, cramping.' On 80mg   lipitor. No fever, cough , congesiton.   Has seen [facility] multiple times for arthrocentesis ( x3) , steroid injections Told he has a baker's cyst, arthritis from Dr Harlow Mares. Has recently had steroid injection and steroid taper with relief, temporary relief.   Saw Dr Harlow Mares 5 days ago, whom advised to see rheumatology ( appt next Thursday). Taking roxicodone for pain. Had hip xrays with Dr Marica Otter and told 'looks good'. ( unable to see these results in epic)   Doesn't know what blood pressure is. Heart is not racing today. Feels well from a 'heart perspective'/   H/o atrial fibrillation- follows with [facility],Dr [name]. Cardioversion 07/13/20. Occasional palpitations from atrial fib. Doesn't want to go off the xarelto until he figures out what is going on with his joint pain.   Denies exertional chest pain or pressure, numbness or tingling radiating to left arm or jaw, , dizziness, frequent headaches, changes in vision, or shortness of breath.    [facility] Dr Erby Pian as patient wanted a second opinion. He was sent to [facility] ED as concerned he was in a.fib at that time.  [facility] ED 08/24/19 and told had viral infection.  While there heartrate was elevated. EKG showed NSR.  Normal  electrolytes.  Elevated liver enzymes.  Negative troponin.  Elevated NT proBNP.  Elevated WBCs   No PE on CT angio.  Groundglass opacities.  No focal pulmonary disease seen on chest x-ray.  Stable cardiomediastinal silhouette.  Unfortunately unable to see assessment and plan for emergency room.  History, background, results pertinent:  H/o CHF Former smoker  A/P/next steps: Problem List Items Addressed This Visit      Cardiovascular and Mediastinum   Atrial fibrillation status post cardioversion [facility])    Recently appears to be NSR.  Symptoms are mild,however seem to be exacerbated by knee pain.  Patient continues to be on Xarelto, he is following with [place] cardiology Dr. [name] in regards to this.  Will follow        Other   Left knee pain    Acute on chronic.  Has been following Dr. Harlow Mares recently.  He has rheumatology appointment coming.  Will follow.      Relevant Orders   Uric acid   Myalgia - Primary    Etiology unclear at this time . fever has resolved.  Advised patient to get Covid tested ASAP.  We will also do work-up rhabdomyolysis, electrolyte abnormalities. I have advised him to hold Lipitor. he will let me know how he is  doing; fu on Monday.       Relevant Orders   CK (Creatine Kinase)   Comprehensive metabolic panel   Magnesium   C-reactive protein   Sedimentation rate   CBC with Differential/Platelet   Urinalysis, Routine w reflex microscopic  Uric acid      I spent 25 min  discussing plan of care over the phone.

## 2019-08-26 NOTE — Telephone Encounter (Signed)
Pt called again today, pt is requesting to speak with PCP today.

## 2019-08-26 NOTE — Telephone Encounter (Signed)
Pt states that his muscles are locking up and it feels like they are ripping. He has an appt with Rheumatology on Thursday but thinks that is too long to wait. He says the pain is making his heart race and his afib is acting up. Does not want to be triaged. Wants a call back from PCP

## 2019-08-26 NOTE — Assessment & Plan Note (Signed)
Acute on chronic.  Has been following Dr. Harlow Mares recently.  He has rheumatology appointment coming.  Will follow.

## 2019-08-27 ENCOUNTER — Other Ambulatory Visit: Payer: Self-pay

## 2019-08-27 ENCOUNTER — Emergency Department: Payer: Commercial Managed Care - PPO

## 2019-08-27 ENCOUNTER — Other Ambulatory Visit
Admission: RE | Admit: 2019-08-27 | Discharge: 2019-08-27 | Disposition: A | Discharge status: Home / Self Care | Payer: Commercial Managed Care - PPO | Source: Ambulatory Visit | Attending: Family | Admitting: Family

## 2019-08-27 ENCOUNTER — Emergency Department
Admission: EM | Admit: 2019-08-27 | Discharge: 2019-08-28 | Disposition: A | Discharge status: Home / Self Care | Payer: Commercial Managed Care - PPO | Attending: Emergency Medicine | Admitting: Emergency Medicine

## 2019-08-27 ENCOUNTER — Ambulatory Visit
Admission: EM | Admit: 2019-08-27 | Discharge: 2019-08-27 | Disposition: A | Discharge status: Other Acute Inpatient Hospital | Payer: Commercial Managed Care - PPO | Source: Home / Self Care | Attending: Family Medicine | Admitting: Family Medicine

## 2019-08-27 DIAGNOSIS — M130 Polyarthritis, unspecified: Secondary | ICD-10-CM | POA: Diagnosis not present

## 2019-08-27 DIAGNOSIS — M791 Myalgia, unspecified site: Secondary | ICD-10-CM | POA: Insufficient documentation

## 2019-08-27 DIAGNOSIS — M25562 Pain in left knee: Secondary | ICD-10-CM

## 2019-08-27 DIAGNOSIS — Z79899 Other long term (current) drug therapy: Secondary | ICD-10-CM | POA: Diagnosis not present

## 2019-08-27 DIAGNOSIS — M25462 Effusion, left knee: Secondary | ICD-10-CM | POA: Insufficient documentation

## 2019-08-27 DIAGNOSIS — Z20822 Contact with and (suspected) exposure to covid-19: Secondary | ICD-10-CM | POA: Insufficient documentation

## 2019-08-27 DIAGNOSIS — Z8673 Personal history of transient ischemic attack (TIA), and cerebral infarction without residual deficits: Secondary | ICD-10-CM | POA: Diagnosis not present

## 2019-08-27 DIAGNOSIS — R000 Tachycardia, unspecified: Secondary | ICD-10-CM

## 2019-08-27 DIAGNOSIS — G8929 Other chronic pain: Secondary | ICD-10-CM

## 2019-08-27 DIAGNOSIS — Z87891 Personal history of nicotine dependence: Secondary | ICD-10-CM | POA: Diagnosis not present

## 2019-08-27 DIAGNOSIS — I10 Essential (primary) hypertension: Secondary | ICD-10-CM | POA: Insufficient documentation

## 2019-08-27 DIAGNOSIS — R52 Pain, unspecified: Secondary | ICD-10-CM | POA: Insufficient documentation

## 2019-08-27 DIAGNOSIS — M2550 Pain in unspecified joint: Secondary | ICD-10-CM

## 2019-08-27 LAB — COMPREHENSIVE METABOLIC PANEL
ALT: 106 U/L — ABNORMAL HIGH (ref 0–44)
AST: 65 U/L — ABNORMAL HIGH (ref 15–41)
Albumin: 3.1 g/dL — ABNORMAL LOW (ref 3.5–5.0)
Alkaline Phosphatase: 109 U/L (ref 38–126)
Anion gap: 13 (ref 5–15)
BUN: 9 mg/dL (ref 6–20)
CO2: 26 mmol/L (ref 22–32)
Calcium: 9.1 mg/dL (ref 8.9–10.3)
Chloride: 98 mmol/L (ref 98–111)
Creatinine, Ser: 0.68 mg/dL (ref 0.61–1.24)
GFR calc Af Amer: >60 mL/min (ref >60)
GFR calc non Af Amer: >60 mL/min (ref >60)
Glucose, Bld: 121 mg/dL — ABNORMAL HIGH (ref 70–99)
Potassium: 3.8 mmol/L (ref 3.5–5.1)
Sodium: 137 mmol/L (ref 135–145)
Total Bilirubin: 0.8 mg/dL (ref 0.3–1.2)
Total Protein: 7.8 g/dL (ref 6.5–8.1)

## 2019-08-27 LAB — BASIC METABOLIC PANEL
Anion gap: 14 (ref 5–15)
BUN: 8 mg/dL (ref 6–20)
CO2: 21 mmol/L — ABNORMAL LOW (ref 22–32)
Calcium: 8.9 mg/dL (ref 8.9–10.3)
Chloride: 100 mmol/L (ref 98–111)
Creatinine, Ser: 0.67 mg/dL (ref 0.61–1.24)
GFR calc Af Amer: >60 mL/min (ref >60)
GFR calc non Af Amer: >60 mL/min (ref >60)
Glucose, Bld: 120 mg/dL — ABNORMAL HIGH (ref 70–99)
Potassium: 3.9 mmol/L (ref 3.5–5.1)
Sodium: 135 mmol/L (ref 135–145)

## 2019-08-27 LAB — CBC
HCT: 41.7 % (ref 39.0–52.0)
Hemoglobin: 13.9 g/dL (ref 13.0–17.0)
MCH: 29.4 pg (ref 26.0–34.0)
MCHC: 33.3 g/dL (ref 30.0–36.0)
MCV: 88.2 fL (ref 80.0–100.0)
Platelets: 476 10*3/uL — ABNORMAL HIGH (ref 150–400)
RBC: 4.73 MIL/uL (ref 4.22–5.81)
RDW: 11.9 % (ref 11.5–15.5)
WBC: 13.1 10*3/uL — ABNORMAL HIGH (ref 4.0–10.5)
nRBC: 0 % (ref 0.0–0.2)

## 2019-08-27 LAB — URINALYSIS, ROUTINE W REFLEX MICROSCOPIC
Bilirubin Urine: NEGATIVE
Glucose, UA: NEGATIVE mg/dL
Hgb urine dipstick: NEGATIVE
Ketones, ur: 5 mg/dL — AB
Leukocytes,Ua: NEGATIVE
Nitrite: NEGATIVE
Protein, ur: NEGATIVE mg/dL
Specific Gravity, Urine: 1.021 (ref 1.005–1.030)
pH: 6 (ref 5.0–8.0)

## 2019-08-27 LAB — CBC WITH DIFFERENTIAL/PLATELET
Abs Immature Granulocytes: 0.08 10*3/uL — ABNORMAL HIGH (ref 0.00–0.07)
Basophils Absolute: 0.1 10*3/uL (ref 0.0–0.1)
Basophils Relative: 1 %
Eosinophils Absolute: 0.2 10*3/uL (ref 0.0–0.5)
Eosinophils Relative: 1 %
HCT: 44.6 % (ref 39.0–52.0)
Hemoglobin: 14.6 g/dL (ref 13.0–17.0)
Immature Granulocytes: 1 %
Lymphocytes Relative: 11 %
Lymphs Abs: 1.5 10*3/uL (ref 0.7–4.0)
MCH: 29.4 pg (ref 26.0–34.0)
MCHC: 32.7 g/dL (ref 30.0–36.0)
MCV: 89.9 fL (ref 80.0–100.0)
Monocytes Absolute: 1.3 10*3/uL — ABNORMAL HIGH (ref 0.1–1.0)
Monocytes Relative: 9 %
Neutro Abs: 10.3 10*3/uL — ABNORMAL HIGH (ref 1.7–7.7)
Neutrophils Relative %: 77 %
Platelets: 465 10*3/uL — ABNORMAL HIGH (ref 150–400)
RBC: 4.96 MIL/uL (ref 4.22–5.81)
RDW: 12 % (ref 11.5–15.5)
WBC: 13.4 10*3/uL — ABNORMAL HIGH (ref 4.0–10.5)
nRBC: 0 % (ref 0.0–0.2)

## 2019-08-27 LAB — TROPONIN I (HIGH SENSITIVITY): Troponin I (High Sensitivity): 7 ng/L (ref <18)

## 2019-08-27 LAB — CK
Total CK: 11 U/L — ABNORMAL LOW (ref 49–397)
Total CK: 12 U/L — ABNORMAL LOW (ref 49–397)

## 2019-08-27 LAB — URIC ACID: Uric Acid, Serum: 3.5 mg/dL — ABNORMAL LOW (ref 3.7–8.6)

## 2019-08-27 LAB — C-REACTIVE PROTEIN: CRP: 6.7 mg/dL — ABNORMAL HIGH (ref <1.0)

## 2019-08-27 LAB — MAGNESIUM: Magnesium: 1.7 mg/dL (ref 1.7–2.4)

## 2019-08-27 LAB — SEDIMENTATION RATE: Sed Rate: 69 mm/hr — ABNORMAL HIGH (ref 0–20)

## 2019-08-27 LAB — SARS CORONAVIRUS 2 AG (30 MIN TAT): SARS Coronavirus 2 Ag: NEGATIVE

## 2019-08-27 LAB — LACTIC ACID, PLASMA: Lactic Acid, Venous: 1.2 mmol/L (ref 0.5–1.9)

## 2019-08-27 IMAGING — CR DG CHEST 2V
1 series · 2 of 2 positions shown · non-contrast
Comparison: [DATE]

CLINICAL DATA: Fever and body aches.

EXAM:
CHEST - 2 VIEW

[Series 1: dg chest 2 view · 0.14mm/px · 2 of 2 slices shown]
[im 1/2]
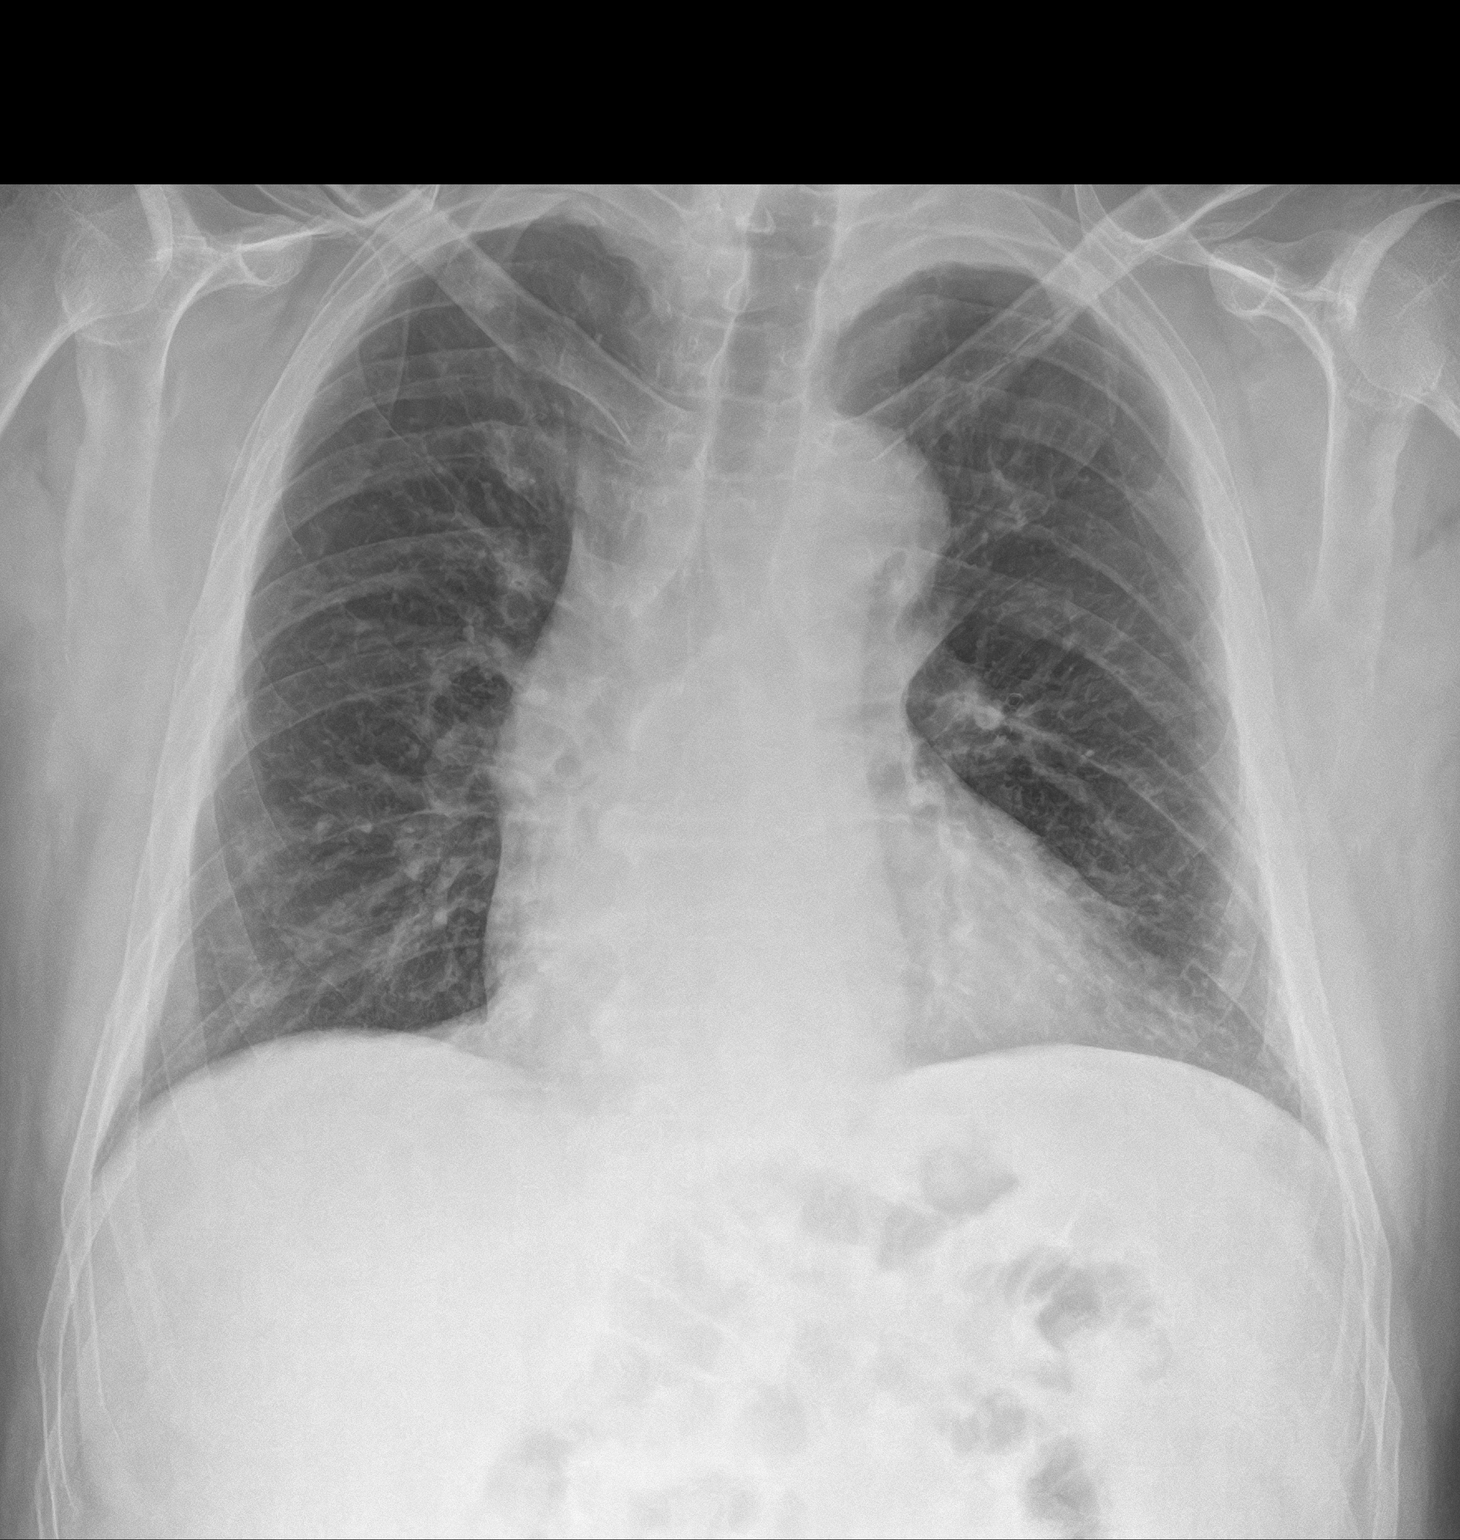
[im 2/2]
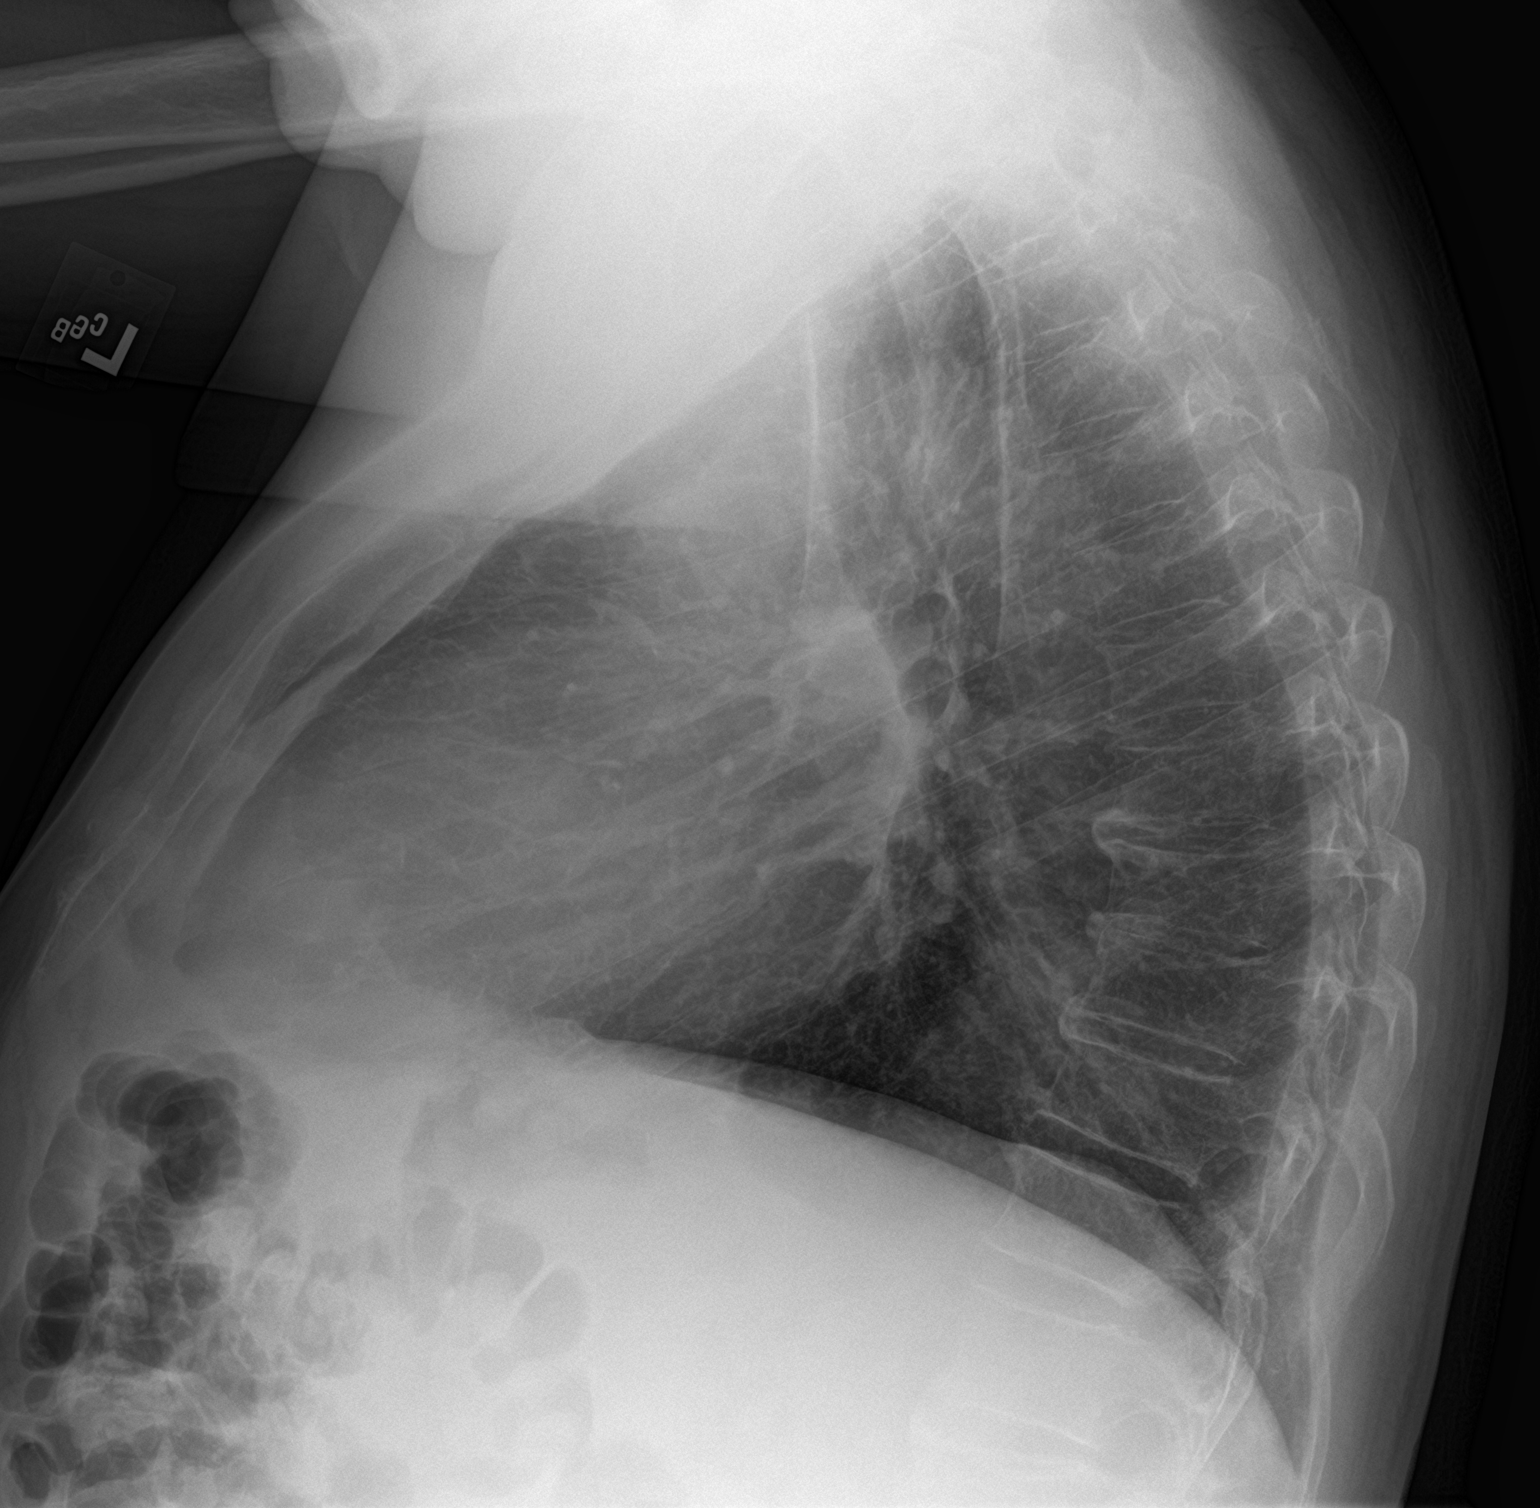

[2 of 2 positions shown; findings below may reference images not displayed]

FINDINGS: Stable cardiomegaly. The hila and mediastinum are normal. No
pneumothorax. No nodules or masses. No focal infiltrates.
IMPRESSION: No active cardiopulmonary disease.

## 2019-08-27 MED ORDER — OXYCODONE HCL 5 MG PO TABS
5.0000 mg | ORAL_TABLET | Freq: Once | ORAL | Status: AC
  Administered 2019-08-27: 5 mg via ORAL
  Filled 2019-08-27: qty 1

## 2019-08-27 MED ORDER — SODIUM CHLORIDE 0.9 % IV BOLUS
1000.0000 mL | Freq: Once | INTRAVENOUS | Status: AC
  Administered 2019-08-27: 1000 mL via INTRAVENOUS

## 2019-08-27 MED ORDER — HYDROMORPHONE HCL 1 MG/ML IJ SOLN
0.5000 mg | Freq: Once | INTRAMUSCULAR | Status: AC
  Administered 2019-08-28: 0.5 mg via INTRAVENOUS
  Filled 2019-08-27: qty 1

## 2019-08-27 NOTE — ED Triage Notes (Signed)
Pt presents with c/o left knee and leg pain. Pt has been diagnosed with Baker's cyst. Pt states it has been drained 3 times and is not improving. Pt states he has extensive muscle aches and overall pain. He has had multiple UC/ED visits for this. Pt would also like a COVID test due to muscle aches.

## 2019-08-27 NOTE — Discharge Instructions (Addendum)
Go directly to emergency room.  

## 2019-08-27 NOTE — ED Provider Notes (Signed)
[facility] Emergency Department Provider Note   ____________________________________________   First MD Initiated Contact with Patient 08/27/19 2353     (approximate)  I have reviewed the triage vital signs and the nursing notes.   HISTORY  Chief Complaint Knee Pain    HPI Jake Liu is a 59 y.o. male sent to the ED from his PCP for further evaluation of elevated inflammatory markers. Patient has been experiencing left knee pain since spending 4 hours on Christmas 2020 putting together dollhouse for his granddaughter. He was evaluated by Dr. Harlow Mares at [facility] who performed arthrocentesis of his knee. Reportedly at that time had recent ruptured Baker's cyst and meniscal tear based on MRI. He was evaluated in the ED at [facility] subsequently and left knee was drained again. He was in atrial fibrillation with rapid ventricular rate at that time and cardioverted. He does take Xarelto for atrial fibrillation. On 1/27 he sought a second opinion with [facility].  Tells me they refused to perform another arthrocentesis since he has had so many since December.  He was found to have low-grade temperature, tachycardic, tachypneic with chest pain at the time and sent to [facility] for further evaluation. Had a negative CT chest for PE. Followed up with his PCP today who found elevated WBC and ESR. Rapid antigen Covid was negative. Since the initial left knee injury, patient has developed polyarthralgia with pain in bilateral shoulders, hips and groin. He has a rheumatology appointment upcoming next week.       Past Medical History:  Diagnosis Date  . Anginal pain ([place])   . Arthritis   . Atrial fibrillation ([place])   . Cardiomyopathy ([place])    EF: 45%  . CHF (congestive heart failure) ([place])   . GERD (gastroesophageal reflux disease)   . History of TIAs   . Hypertension   . Insomnia   . Sciatica   . Sleep apnea     Patient Active Problem List   Diagnosis Date Noted  . Myalgia 08/26/2019  . Left knee pain 08/26/2019  . Flank pain 08/20/2018  . Fatty liver disease, nonalcoholic 44/07/270  . Right lower quadrant abdominal pain 08/18/2018  . Erectile dysfunction 03/08/2018  . GAD (generalized anxiety disorder) 03/08/2018  . Bilateral low back pain without sciatica 03/08/2018  . Viral illness 03/24/2016  . Umbilical hernia 53/66/4403  . Hyperlipidemia 07/31/2014  . Obstructive sleep apnea 01/10/2014  . GERD (gastroesophageal reflux disease) 12/07/2013  . Chest pain 01/25/2013  . Atrial fibrillation status post cardioversion ([place]) 01/12/2012  . Hypertension 01/12/2012  . Insomnia 01/12/2012    Past Surgical History:  Procedure Laterality Date  . ABLATION N/A    for af  . COLONOSCOPY    . COLONOSCOPY WITH PROPOFOL N/A 10/13/2018   Procedure: COLONOSCOPY WITH PROPOFOL;  Surgeon: Toledo, [name], MD;  Location: [facility] ENDOSCOPY;  Service: Gastroenterology;  Laterality: N/A;  . ESOPHAGOGASTRODUODENOSCOPY    . ESOPHAGOGASTRODUODENOSCOPY (EGD) WITH PROPOFOL N/A 10/13/2018   Procedure: ESOPHAGOGASTRODUODENOSCOPY (EGD) WITH PROPOFOL;  Surgeon: Toledo, [name], MD;  Location: [facility] ENDOSCOPY;  Service: Gastroenterology;  Laterality: N/A;  . HERNIA REPAIR    . KNEE SURGERY    . ROTATOR CUFF REPAIR      Prior to Admission medications   Medication Sig Start Date End Date Taking? Authorizing Provider  amLODipine (NORVASC) 5 MG tablet TAKE 1 TABLET(5 MG) BY MOUTH DAILY 03/28/19   Burnard Hawthorne, FNP  atorvastatin (LIPITOR) 80 MG tablet Take 1 tablet (  80 mg total) by mouth daily. 09/03/18 12/02/18  Minna Merritts, MD  lidocaine (LIDODERM) 5 % Place 1 patch onto the skin daily. Remove & Discard patch within 12 hours or as directed by MD 08/28/19   Paulette Blanch, MD  metoprolol succinate (TOPROL-XL) 50 MG 24 hr tablet Take 1 tablet (50 mg total) by mouth daily. Take with or immediately following a meal. 01/14/13   Jackolyn Confer, MD    omeprazole (PRILOSEC) 40 MG capsule TAKE 1 CAPSULE(40 MG) BY MOUTH DAILY 07/06/19   Burnard Hawthorne, FNP  oxyCODONE 10 MG TABS Take 1 tablet (10 mg total) by mouth every 6 (six) hours as needed for severe pain. 08/28/19   Paulette Blanch, MD  rivaroxaban (XARELTO) 20 MG TABS tablet Take by mouth. 04/01/13   [provider]    Allergies Sulfa antibiotics, Sulfasalazine, Bactrim [sulfamethoxazole-trimethoprim], and Lisinopril  Family History  Problem Relation Age of Onset  . Heart disease Mother 77  . Asthma Father     Social History Social History   Tobacco Use  . Smoking status: Former Research scientist (life sciences)  . Smokeless tobacco: Never Used  Substance Use Topics  . Alcohol use: Yes    Alcohol/week: 14.0 standard drinks    Types: 7 Glasses of wine, 7 Standard drinks or equivalent per week    Comment: drinks at night., bottle and half of red wine;  Drinks 10 cups coffee daily  . Drug use: No    Review of Systems  Constitutional: No fever/chills Eyes: No visual changes. ENT: No sore throat. Cardiovascular: Denies chest pain. Respiratory: Denies shortness of breath. Gastrointestinal: No abdominal pain.  No nausea, no vomiting.  No diarrhea.  No constipation. Genitourinary: Negative for dysuria. Musculoskeletal: Positive for left knee pain primarily. Positive for bilateral shoulder and hip pain. Negative for back pain. Skin: Negative for rash. Neurological: Negative for headaches, focal weakness or numbness.   ____________________________________________   PHYSICAL EXAM:  VITAL SIGNS: ED Triage Vitals  Enc Vitals Group     BP 08/27/19 1650 139/67     Pulse Rate 08/27/19 1650 (!) 136     Resp 08/27/19 1650 16     Temp 08/27/19 1650 98.1 F (36.7 C)     Temp Source 08/27/19 1650 Oral     SpO2 08/27/19 1650 93 %     Weight 08/27/19 1653 204 lb (92.5 kg)     Height --      Head Circumference --      Peak Flow --      Pain Score 08/27/19 1653 7     Pain Loc --      Pain  Edu? --      Excl. in [place]? --     Constitutional: Alert and oriented. Well appearing and in mild acute distress. Eyes: Conjunctivae are normal. PERRL. EOMI. Head: Atraumatic. Nose: No congestion/rhinnorhea. Mouth/Throat: Mucous membranes are moist.   Neck: No stridor.  No cervical spine tenderness to palpation. Cardiovascular: Normal rate, regular rhythm. Grossly normal heart sounds.  Good peripheral circulation. Respiratory: Normal respiratory effort.  No retractions. Lungs CTAB. Gastrointestinal: Soft and nontender. No distention. No abdominal bruits. No CVA tenderness. Musculoskeletal: Left knee in loose compressive stocking. Effusion noted. Decreased range of motion secondary to pain. Mild warmth and erythema. Supple calf. 2+ distal pulses. Shoulders and hips with full range of motion. Neurologic:  Normal speech and language. No gross focal neurologic deficits are appreciated.  Skin:  Skin is warm, dry and intact.  No rash noted. Psychiatric: Mood and affect are normal. Speech and behavior are normal.  ____________________________________________   LABS (all labs ordered are listed, but only abnormal results are displayed)  Labs Reviewed  CK - Abnormal; Notable for the following components:      Result Value   Total CK 12 (*)    All other components within normal limits  CBC - Abnormal; Notable for the following components:   WBC 13.1 (*)    Platelets 476 (*)    All other components within normal limits  BASIC METABOLIC PANEL - Abnormal; Notable for the following components:   CO2 21 (*)    Glucose, Bld 120 (*)    All other components within normal limits  HEPATIC FUNCTION PANEL - Abnormal; Notable for the following components:   Albumin 2.7 (*)    AST 42 (*)    ALT 88 (*)    All other components within normal limits  URINALYSIS, COMPLETE (UACMP) WITH MICROSCOPIC - Abnormal; Notable for the following components:   Color, Urine AMBER (*)    APPearance HAZY (*)    All  other components within normal limits  RESPIRATORY PANEL BY RT PCR (FLU A&B, COVID)  CULTURE, BLOOD (ROUTINE X 2)  CULTURE, BLOOD (ROUTINE X 2)  URINE CULTURE  LACTIC ACID, PLASMA  LIPASE, BLOOD  PROCALCITONIN  MONONUCLEOSIS SCREEN  TROPONIN I (HIGH SENSITIVITY)  TROPONIN I (HIGH SENSITIVITY)   ____________________________________________  EKG  ED ECG REPORT I, Adolphe Fortunato J, the attending physician, personally viewed and interpreted this ECG.   Date: 08/28/2019  EKG Time: 1716  Rate: 136  Rhythm: sinus tachycardia  Axis: Normal  Intervals:none  ST&T Change: Nonspecific  ____________________________________________  RADIOLOGY  ED MD interpretation: No focal infiltrate; verbal MRI report demonstrates joint effusion, no osteomyelitis, thickening of PCL without tear  Official radiology report(s): DG Chest 2 View  Result Date: 08/27/2019 CLINICAL DATA:  Fever and body aches. EXAM: CHEST - 2 VIEW COMPARISON:  August 06, 2019 FINDINGS: Stable cardiomegaly. The hila and mediastinum are normal. No pneumothorax. No nodules or masses. No focal infiltrates. IMPRESSION: No active cardiopulmonary disease. Electronically Signed   By: Dorise Bullion III M.D   On: 08/27/2019 18:40    ____________________________________________   PROCEDURES  Procedure(s) performed (including Critical Care):  Procedures   ____________________________________________   INITIAL IMPRESSION / ASSESSMENT AND PLAN / ED COURSE  As part of my medical decision making, I reviewed the following data within the [place] notes reviewed and incorporated, Labs reviewed, EKG interpreted, Old chart reviewed, Radiograph reviewed, Notes from prior ED visits and [place] Controlled Substance Database     Delmar Arriaga was evaluated in Emergency Department on 08/28/2019 for the symptoms described in the history of present illness. He was evaluated in the context of the global COVID-19 pandemic,  which necessitated consideration that the patient might be at risk for infection with the SARS-CoV-2 virus that causes COVID-19. Institutional protocols and algorithms that pertain to the evaluation of patients at risk for COVID-19 are in a state of rapid change based on information released by regulatory bodies including the CDC and federal and state organizations. These policies and algorithms were followed during the patient's care in the ED.    59 year old male presenting with left knee pain and effusion since 07/22/2019. Has had several visits with orthopedics status post arthrocentesis. Required cardioversion for A. fib with RVR. He is on Xarelto. Now with polyarthralgia, low-grade fevers, elevated inflammatory markers thus sent to  the ED for sepsis work-up.  Sepsis work-up here is reassuring with unremarkable lactic acid and negative procalcitonin. 4 Plex respiratory panel is negative. Chest x-ray and urinalysis are unremarkable. Patient initially was unsure whether or not he did have knee MRI. This was reported in the [place] clinic note but they were also unable to see the report in the computer. I reviewed patient's chart and see the ultrasound result and his visits from [place] and [place] including CT chest. Will obtain MRI left knee to evaluate for inflammation, infection, bleeding. Pain is currently controlled after IV Dilaudid.   Clinical Course as of Aug 28 551  Sun Aug 28, 2019  2111 Updated patient on all laboratory and MRI result.  Has an upcoming rheumatology appointment on Thursday.  Currently taking oxycodone 5 mg every 8 hours.  Will increase dose and frequency.  Trial lidocaine patch and knee immobilizer over his stocking.  Walker and set of crutches for ambulation. Strict return precautions given.  Patient verbalizes understanding and agrees with plan of care.   [JS]    Clinical Course User Index [JS] Paulette Blanch, MD      ____________________________________________   FINAL CLINICAL IMPRESSION(S) / ED DIAGNOSES  Final diagnoses:  Effusion of left knee  Acute pain of left knee  Polyarthralgia     ED Discharge Orders         Ordered    oxyCODONE 10 MG TABS  Every 6 hours PRN     08/28/19 0343    lidocaine (LIDODERM) 5 %  Every 24 hours     08/28/19 0345           Note:  This document was prepared using Dragon voice recognition software and may include unintentional dictation errors.   Paulette Blanch, MD 08/28/19 [id number]

## 2019-08-27 NOTE — ED Triage Notes (Signed)
First RN Note: This Rn received call from Marylene Land, NP at [facility]. Per NP pt with elevated WBC w/ shift, elevated CRP, negative rapid Covid at UC. Per Marylene Land, NP pt with disproportionate pain to L knee and generalized body aches despite taking Tylenol, pt reports no Tylenol today but has been taking Oxycodone.

## 2019-08-27 NOTE — ED Provider Notes (Signed)
[facility] ____________________________________________  Time seen: Approximately 4:22 PM  I have reviewed the triage vital signs and the nursing notes.   HISTORY  Chief Complaint Knee Pain (left, Baker's cyst)   HPI Yoshi Leeb is a 59 y.o. male past medical history of atrial fibrillation post cardioversion, hypertension, cardiomyopathy presenting for diffuse pain as well as focal left knee pain.  States pain is predominantly in his left knee, shoulders, hips and groin.  Patient has been seen numerous times in various urgent care, orthopedic and ER in the last month.  Patient reports he has been seen by orthopedic both in [place] as well as [facility] for his left knee pain and has had x3 arthrocentesis that per patient was overall unremarkable.  Patient reports for the last 2 to 3 weeks he has been having increasing pain to the left knee as well as accompanying generalized pain.  States this is severe.  Also having accompanying tachycardic episodes.  Denies chest pain or shortness of breath.  No accompanying fevers, cough, congestion vomiting or diarrhea.  Denies rashes.  Denies dysuria, penile testicular pain or swelling.  Reports struggling to walk and weight-bear due to left knee pain.  Has been taking intermittent pain medicine oxycodone as well as Tylenol every 8 hours around-the-clock.  Reports he had laboratory studies outpatient this morning as well.  Coming to urgent care due to continued pain.  No previous Covid testing has been completed.  Burnard Hawthorne, FNP : PCP    Past Medical History:  Diagnosis Date  . Anginal pain ([place])   . Arthritis   . Atrial fibrillation ([place])   . Cardiomyopathy ([place])    EF: 45%  . CHF (congestive heart failure) ([place])   . GERD (gastroesophageal reflux disease)   . History of TIAs   . Hypertension   . Insomnia   . Sciatica   . Sleep apnea     Patient Active Problem List   Diagnosis Date Noted  . Myalgia 08/26/2019  . Left  knee pain 08/26/2019  . Flank pain 08/20/2018  . Fatty liver disease, nonalcoholic [id number]  . Right lower quadrant abdominal pain 08/18/2018  . Erectile dysfunction 03/08/2018  . GAD (generalized anxiety disorder) 03/08/2018  . Bilateral low back pain without sciatica 03/08/2018  . Viral illness 03/24/2016  . Umbilical hernia 0000000  . Hyperlipidemia 07/31/2014  . Obstructive sleep apnea 01/10/2014  . GERD (gastroesophageal reflux disease) 12/07/2013  . Chest pain 01/25/2013  . Atrial fibrillation status post cardioversion ([place]) 01/12/2012  . Hypertension 01/12/2012  . Insomnia 01/12/2012    Past Surgical History:  Procedure Laterality Date  . ABLATION N/A    for af  . COLONOSCOPY    . COLONOSCOPY WITH PROPOFOL N/A 10/13/2018   Procedure: COLONOSCOPY WITH PROPOFOL;  Surgeon: Toledo, [name], MD;  Location: [facility];  Service: Gastroenterology;  Laterality: N/A;  . ESOPHAGOGASTRODUODENOSCOPY    . ESOPHAGOGASTRODUODENOSCOPY (EGD) WITH PROPOFOL N/A 10/13/2018   Procedure: ESOPHAGOGASTRODUODENOSCOPY (EGD) WITH PROPOFOL;  Surgeon: Toledo, [name], MD;  Location: [facility];  Service: Gastroenterology;  Laterality: N/A;  . HERNIA REPAIR    . KNEE SURGERY    . ROTATOR CUFF REPAIR       No current facility-administered medications for this encounter.  Current Outpatient Medications:  .  amLODipine (NORVASC) 5 MG tablet, TAKE 1 TABLET(5 MG) BY MOUTH DAILY, Disp: 90 tablet, Rfl: 4 .  atorvastatin (LIPITOR) 80 MG tablet, Take 1 tablet (80 mg total) by mouth daily., Disp:  90 tablet, Rfl: 3 .  metoprolol succinate (TOPROL-XL) 50 MG 24 hr tablet, Take 1 tablet (50 mg total) by mouth daily. Take with or immediately following a meal., Disp: 30 tablet, Rfl: 3 .  omeprazole (PRILOSEC) 40 MG capsule, TAKE 1 CAPSULE(40 MG) BY MOUTH DAILY, Disp: 30 capsule, Rfl: 3 .  oxyCODONE (ROXICODONE) 5 MG immediate release tablet, Take 1 tablet (5 mg total) by mouth every 8 (eight) hours  as needed., Disp: 20 tablet, Rfl: 0 .  rivaroxaban (XARELTO) 20 MG TABS tablet, Take by mouth., Disp: , Rfl:   Allergies Sulfa antibiotics, Sulfasalazine, Bactrim [sulfamethoxazole-trimethoprim], and Lisinopril  Family History  Problem Relation Age of Onset  . Heart disease Mother 55  . Asthma Father     Social History Social History   Tobacco Use  . Smoking status: Former Research scientist (life sciences)  . Smokeless tobacco: Never Used  Substance Use Topics  . Alcohol use: Yes    Alcohol/week: 14.0 standard drinks    Types: 7 Glasses of wine, 7 Standard drinks or equivalent per week    Comment: drinks at night., bottle and half of red wine;  Drinks 10 cups coffee daily  . Drug use: No    Review of Systems Constitutional: No fever ENT: No sore throat. Cardiovascular: Denies chest pain. Respiratory: Denies shortness of breath. Gastrointestinal: No abdominal pain.  No vomiting.  No diarrhea.  No constipation. Genitourinary: Negative for dysuria. Musculoskeletal: Positive diffuse pain. Skin: Negative for rash. Neurological: Negative for focal weakness or numbness.   ____________________________________________   PHYSICAL EXAM:  VITAL SIGNS: ED Triage Vitals  Enc Vitals Group     BP 08/27/19 1523 (!) 143/106     Pulse Rate 08/27/19 1523 (!) 134     Resp -- 22     Temp 08/27/19 1523 98.4 F (36.9 C)     Temp Source 08/27/19 1523 Oral     SpO2 08/27/19 1523 97 %     Weight 08/27/19 1522 204 lb (92.5 kg)     Height 08/27/19 1522 6' (1.829 m)     Head Circumference --      Peak Flow --      Pain Score 08/27/19 1521 7     Pain Loc --      Pain Edu? --      Excl. in [place]? --     Constitutional: Alert and oriented.  Eyes: Conjunctivae are normal.  ENT      Head: Normocephalic and atraumatic. Cardiovascular: Tachycardic.  Regular rhythm.  Good peripheral circulation. Respiratory: Normal respiratory effort without tachypnea nor retractions. Breath sounds are clear and equal bilaterally.  No wheezes, rales, rhonchi. Musculoskeletal: Bilateral distal pedal pulses equal.  Left knee edematous, mildly warm to touch, no erythema, flexion and extension limited, moderate pain diffusely to anterior knee with effusion, mild posterior knee pain.  No lower calf pain. Neurologic:  Normal speech and language. No gross focal neurologic deficits are appreciated. Speech is normal.  Skin:  Skin is warm, dry and intact. No rash noted. Psychiatric: Mood and affect are normal. Speech and behavior are normal. Patient exhibits appropriate insight and judgment   ___________________________________________   LABS (all labs ordered are listed, but only abnormal results are displayed)  Labs Reviewed  SARS CORONAVIRUS 2 AG (30 MIN TAT)  NOVEL CORONAVIRUS, NAA (HOSP ORDER, SEND-OUT TO REF LAB; TAT 18-24 HRS)   ____________________________________________  EKG  ED ECG REPORT I, Marylene Land, the attending provider, personally viewed and interpreted this ECG.  Date: 08/27/2019  EKG Time: 1536  Rate: 138  Rhythm: Sinus tachycardia with premature atrial complexes  Axis: Normal  Intervals:none  ST&T Change: none   PROCEDURES Procedures    INITIAL IMPRESSION / ASSESSMENT AND PLAN / ED COURSE  Pertinent labs & imaging results that were available during my care of the patient were reviewed by me and considered in my medical decision making (see chart for details).  Patient appears to be in pain, uncomfortable..  Tachycardic but not in A. fib at this time.  Reviewed recent labs both at [facility] as well as [facility] from this past month.  Laboratory test from today also reviewed.  Noted WBC at 13 with left shift.  Inflammatory markers elevated.   In urgent care rapid Covid negative, PCR sent.  No clear etiology at this time to explain patient's symptoms and labs.  Multiple differentials with patient were discussed, including concern for potential septic knee, sepsis, and no previous blood cultures  have been obtained.  As patient pain has continued and patient is tachycardic at this time recommend further evaluation emergency room.  Patient declines EMS transport and states that his daughter will take him directly to [facility] at this time.  Megan RN triage nurse called and given report.  ____________________________________________   FINAL CLINICAL IMPRESSION(S) / ED DIAGNOSES  Final diagnoses:  Tachycardia  Diffuse pain     ED Discharge Orders    None       Note: This dictation was prepared with Dragon dictation along with smaller phrase technology. Any transcriptional errors that result from this process are unintentional.         Marylene Land, NP 08/27/19 1637

## 2019-08-27 NOTE — ED Triage Notes (Signed)
Pt presents via POV c/o left knee pain and swelling since Christmas. Also reports generalized muscle pain throughout body. Reports has been to ED and orthopedic surgeon several times. Reports has had fluid drained on left knee multiple times. Orders received from [facility] MD.

## 2019-08-28 ENCOUNTER — Emergency Department: Payer: Commercial Managed Care - PPO

## 2019-08-28 LAB — URINALYSIS, COMPLETE (UACMP) WITH MICROSCOPIC
Bacteria, UA: NONE SEEN
Bilirubin Urine: NEGATIVE
Glucose, UA: NEGATIVE mg/dL
Hgb urine dipstick: NEGATIVE
Ketones, ur: NEGATIVE mg/dL
Leukocytes,Ua: NEGATIVE
Nitrite: NEGATIVE
Protein, ur: NEGATIVE mg/dL
Specific Gravity, Urine: 1.025 (ref 1.005–1.030)
Squamous Epithelial / HPF: NONE SEEN (ref 0–5)
pH: 5 (ref 5.0–8.0)

## 2019-08-28 LAB — HEPATIC FUNCTION PANEL
ALT: 88 U/L — ABNORMAL HIGH (ref 0–44)
AST: 42 U/L — ABNORMAL HIGH (ref 15–41)
Albumin: 2.7 g/dL — ABNORMAL LOW (ref 3.5–5.0)
Alkaline Phosphatase: 89 U/L (ref 38–126)
Bilirubin, Direct: 0.1 mg/dL (ref 0.0–0.2)
Indirect Bilirubin: 0.5 mg/dL (ref 0.3–0.9)
Total Bilirubin: 0.6 mg/dL (ref 0.3–1.2)
Total Protein: 7 g/dL (ref 6.5–8.1)

## 2019-08-28 LAB — PROCALCITONIN: Procalcitonin: <0.1 ng/mL

## 2019-08-28 LAB — RESPIRATORY PANEL BY RT PCR (FLU A&B, COVID)
Influenza A by PCR: NEGATIVE
Influenza B by PCR: NEGATIVE
SARS Coronavirus 2 by RT PCR: NEGATIVE

## 2019-08-28 LAB — LIPASE, BLOOD: Lipase: 25 U/L (ref 11–51)

## 2019-08-28 LAB — TROPONIN I (HIGH SENSITIVITY): Troponin I (High Sensitivity): 8 ng/L (ref <18)

## 2019-08-28 IMAGING — MR MR KNEE*L* W/O CM
6 series · 40 of 40 positions shown · non-contrast
Comparison: Left knee x-ray [DATE]

CLINICAL DATA: Knee pain and swelling. Low-grade fever. History of
arthrocentesis

EXAM:
MRI OF THE LEFT KNEE WITHOUT CONTRAST
TECHNIQUE: Multiplanar, multisequence MR imaging of the knee was performed. No
intravenous contrast was administered.

[Series 23: T2 fat-sat · axial · left · 4.0mm · 0.56mm/px · z∈[-107,+17]mm · 6 of 26 slices shown (1 of 3)]
[im 1/26]
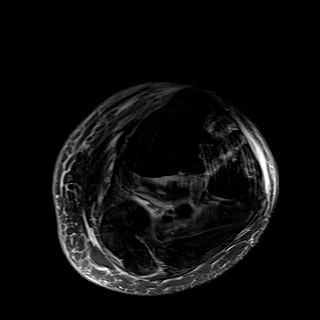
[im 6/26]
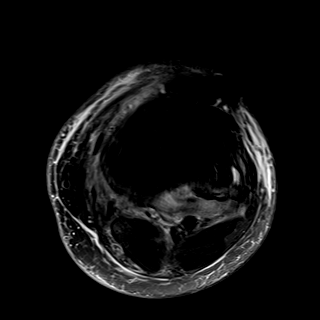
[im 11/26]
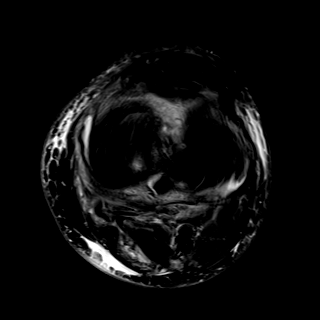
[im 16/26]
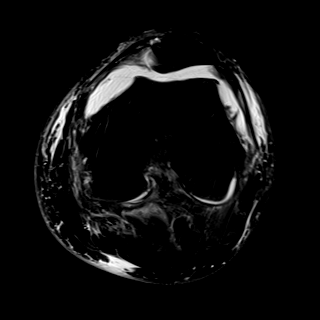
[im 21/26]
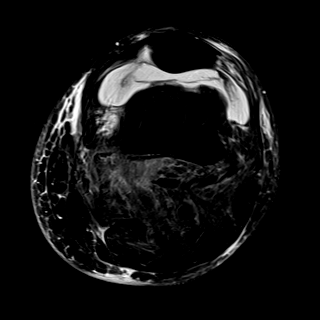
[im 26/26]
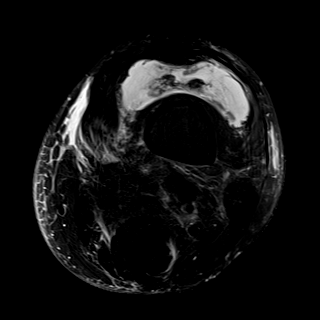

[Series 24: T2 fat-sat · coronal · left · 4.0mm · 0.70mm/px · 6 of 30 slices shown (2 of 3)]
[im 1/30]
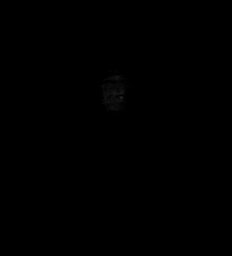
[im 6/30]
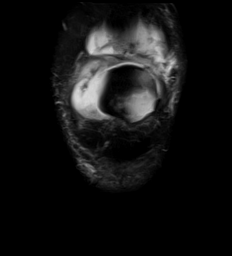
[im 12/30]
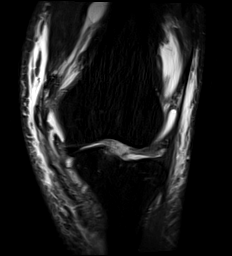
[im 18/30]
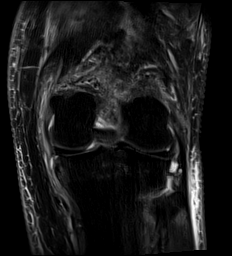
[im 24/30]
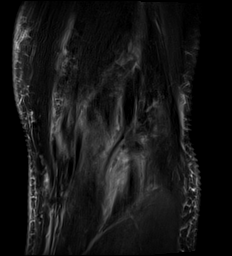
[im 30/30]
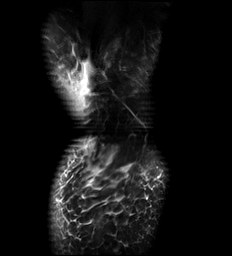

[Series 25: T1 · coronal · left · 4.0mm · 0.70mm/px · 6 of 30 slices shown]
[im 1/30]
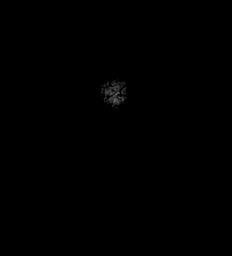
[im 6/30]
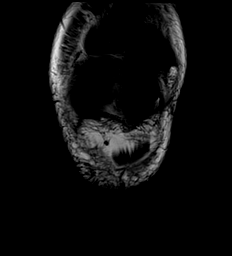
[im 12/30]
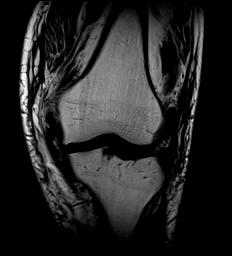
[im 18/30]
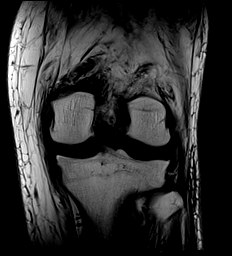
[im 24/30]
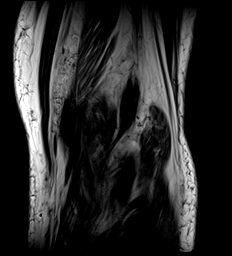
[im 30/30]
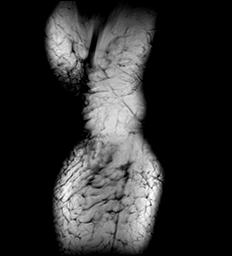

[Series 26: PD fat-sat · coronal · left · 4.0mm · 0.70mm/px · 6 of 30 slices shown (1 of 2)]
[im 1/30]
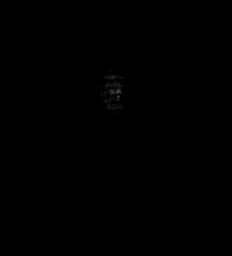
[im 6/30]
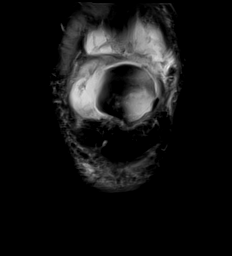
[im 12/30]
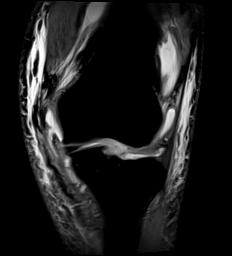
[im 18/30]
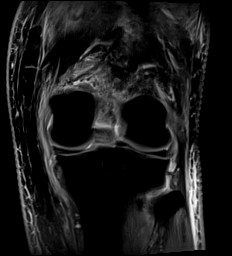
[im 24/30]
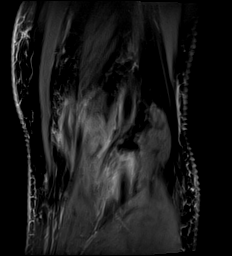
[im 30/30]
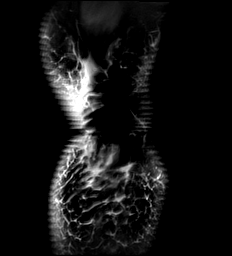

[Series 27: PD fat-sat · sagittal · left · 3.0mm · 0.62mm/px · 8 of 36 slices shown (2 of 2)]
[im 1/36]
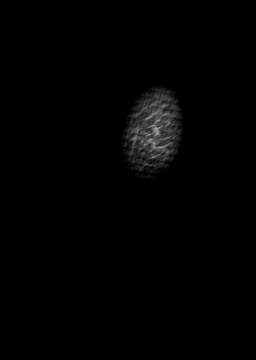
[im 6/36]
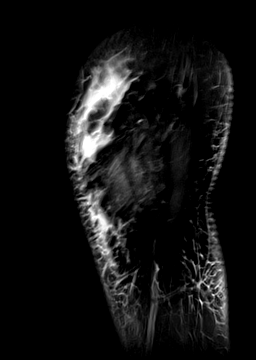
[im 11/36]
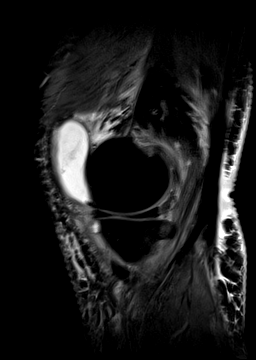
[im 16/36]
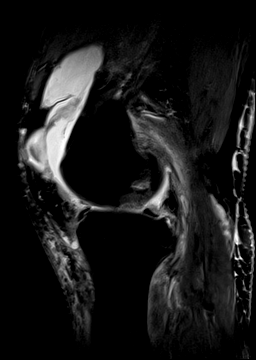
[im 21/36]
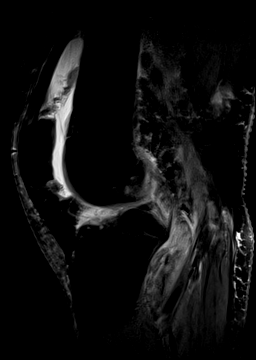
[im 26/36]
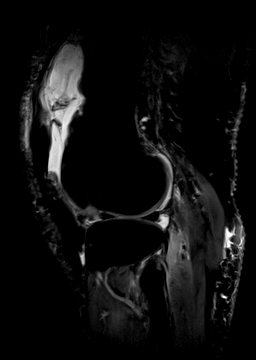
[im 31/36]
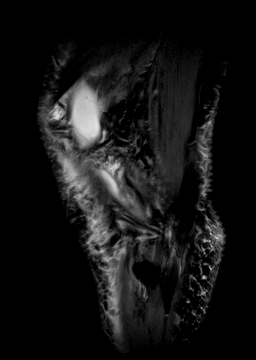
[im 36/36]
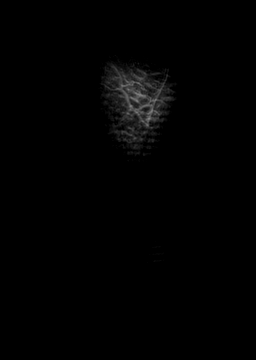

[Series 28: T2 fat-sat · sagittal · left · 3.0mm · 0.62mm/px · 8 of 36 slices shown (3 of 3)]
[im 1/36]
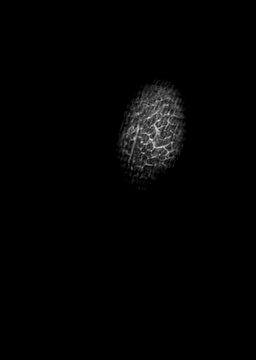
[im 6/36]
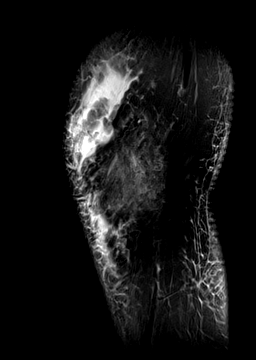
[im 11/36]
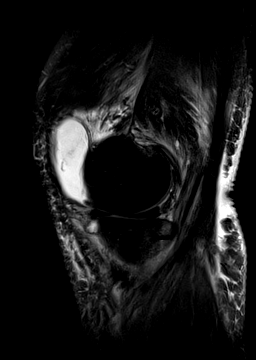
[im 16/36]
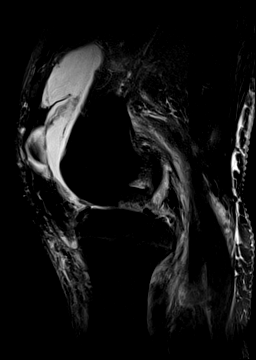
[im 21/36]
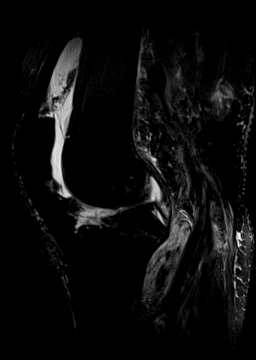
[im 26/36]
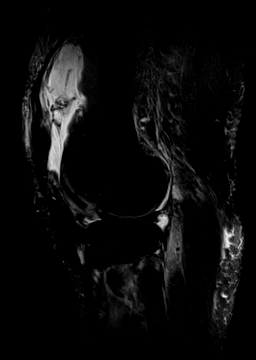
[im 31/36]
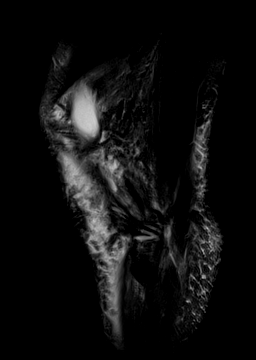
[im 36/36]
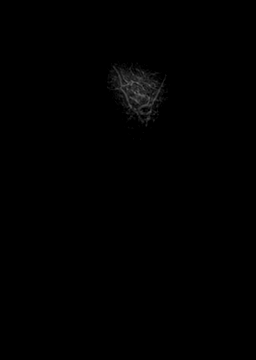

[40 of 40 positions shown; findings below may reference images not displayed]

FINDINGS: Technical note: Motion degraded examination.

MENISCI

Medial meniscus: Intrasubstance degeneration without discrete fluid
signal tear standing to an articular surface.

Lateral meniscus: Intrasubstance degeneration without discrete tear.

LIGAMENTS

Cruciates:  ACL and PCL intact with probable mucinous degeneration.

Collaterals: Medial collateral ligament is intact. Lateral
collateral ligament complex is intact.

CARTILAGE

Patellofemoral: Mild to moderate chondral thinning of the inferior
aspect of the lateral patellar facet and lateral trochlea.

Medial: Mild chondral thinning and surface irregularity of the
weight-bearing surfaces.

Lateral:  No chondral defect.

Joint: Moderate to large joint effusion with extensive synovitis.
Edema within Hoffa's fat.

Popliteal Fossa: No Baker's cyst. Popliteus tendon intact.
Intramuscular edema within the popliteus muscle.

Extensor Mechanism:  Intact quadriceps tendon and patellar tendon.

Bones: No bony erosion or cortical destruction. There are reactive
subchondral marrow changes within the medial and patellofemoral
compartments. Joint space narrowing and marginal osteophyte
formation. No fracture. No suspicious bone lesion.

Other: Extensive soft tissue edema about the knee. Mild-to-moderate
circumferential subcutaneous edema. No well-defined or drainable
soft tissue fluid collection.
IMPRESSION: 1. Moderate to large joint effusion with extensive synovitis. An
infectious or inflammatory arthropathy is the primary consideration
and arthrocentesis is recommended.
2. Intramuscular edema is most pronounced within the popliteus
muscle. Findings are nonspecific and could be due to muscle strain
or myositis. No well-defined or drainable soft tissue fluid
collection.
3. Mild to moderate patellofemoral and medial compartment
osteoarthritis.
4. Intrasubstance degeneration of the medial and lateral menisci
without discrete tear.
5. Intact cruciate and collateral ligaments.

These findings are in agreement with the initial preliminary report
provided in PACS by Dr. POUSTERLANDS.

These results will be called to the ordering clinician or
representative by the Radiologist Assistant, and communication
documented in the PACS or zVision Dashboard.

## 2019-08-28 MED ORDER — OXYCODONE HCL 10 MG PO TABS: 10.0000 mg | ORAL_TABLET | Freq: Four times a day (QID) | ORAL | 0 refills | Status: DC | PRN

## 2019-08-28 MED ORDER — OXYCODONE HCL 5 MG PO TABS
5.0000 mg | ORAL_TABLET | Freq: Once | ORAL | Status: AC
  Administered 2019-08-28: 5 mg via ORAL
  Filled 2019-08-28: qty 1

## 2019-08-28 MED ORDER — LIDOCAINE 5 % EX PTCH: 1.0000 | MEDICATED_PATCH | CUTANEOUS | 0 refills | Status: DC

## 2019-08-28 MED ORDER — LIDOCAINE 5 % EX PTCH
1.0000 | MEDICATED_PATCH | CUTANEOUS | Status: DC
  Administered 2019-08-28: 1 via TRANSDERMAL
  Filled 2019-08-28: qty 1

## 2019-08-28 NOTE — ED Notes (Signed)
Pt left leg elevated with use of stool due to swelling in left ankle per MD Beather Arbour verbal order.

## 2019-08-28 NOTE — ED Notes (Signed)
Pt signed paper discharge due to signature pad not being available

## 2019-08-28 NOTE — ED Notes (Signed)
Pt was given his credit card, id and cell phone that was removed during MRI. Pt verbalizes all his belongings were returned back to him

## 2019-08-28 NOTE — Discharge Instructions (Addendum)
1.  You can increase Oxycodone to 10 mg every 6 hours as needed for severe pain. 2.  Apply lidocaine patch every 12 hours. 3.  Use walker to help you balance as you walk. 4.  Use knee immobilizer while awake. 5.  Return to the ER for worsening symptoms, persistent vomiting, difficulty breathing or other concerns.

## 2019-08-28 NOTE — ED Notes (Addendum)
Pt to 19H declining to get into hallway bed preferring to wait for a room in the wheelchair - pt oriented to treatment will probably happen in this hall bed.  Pt reports this movement will likely make the pain worse.  Pt c/o pain in left knee since spending 4 hours on Christmas putting together a doll house.  Pt reports hx of arthritis and a Baker's cyst, "that knee has been drained three times", PCP has referred pt to a rheumatologist.  Pt was at Urgent care earlier today and told to come to ED and have cultures drawn.    Blood work done in triage.  Left foot appears red and swollen compared to right - +1 non-pitting. CMS intact to the extremity, pulses comparable moderate left and right.  Pt requesting pain meds - EDP notified

## 2019-08-28 NOTE — ED Provider Notes (Signed)
Radiology FYI ED regarding final MRI knee report.  In discussion with radiology, the final report is in agreement with the initial preliminary report that was verbally dictated to Dr. Beather Arbour.  No acute changes or actions are required.   Lilia Pro., MD 08/28/19 1014

## 2019-08-29 ENCOUNTER — Ambulatory Visit (INDEPENDENT_AMBULATORY_CARE_PROVIDER_SITE_OTHER): Payer: Commercial Managed Care - PPO | Admitting: Family

## 2019-08-29 ENCOUNTER — Telehealth: Payer: Self-pay | Admitting: Family

## 2019-08-29 ENCOUNTER — Other Ambulatory Visit: Payer: Commercial Managed Care - PPO

## 2019-08-29 ENCOUNTER — Encounter: Payer: Self-pay | Admitting: Family

## 2019-08-29 VITALS — Ht 72.0 in | Wt 206.0 lb

## 2019-08-29 DIAGNOSIS — M791 Myalgia, unspecified site: Secondary | ICD-10-CM

## 2019-08-29 LAB — URINE CULTURE: Culture: <10000 — AB

## 2019-08-29 LAB — NOVEL CORONAVIRUS, NAA (HOSP ORDER, SEND-OUT TO REF LAB; TAT 18-24 HRS): SARS-CoV-2, NAA: NOT DETECTED

## 2019-08-29 NOTE — Progress Notes (Signed)
Verbal consent for services obtained from patient prior to services given to TELEPHONE visit:   Location of call:  provider at work patient at home  Names of all persons present for services: [name], NP Chief complaint:  ED follow up CC: Polyarthralgia  Patient unable to come in person as he doesn't have ride.  Joints that are painful. Right groin hurts when goes to stands.  shoulders are the worse today in regards to pain. Left knee is swollen however in regards to pain, this is less bothersome. Has left knee raised on couch. No swelling of upper extremities. No numbness. No neck pain.   Continues to muscle pain, 'feels like they are tearing'. Even with movement and stretching, the pain is worse.   Feels weird on oxycodone and he feels sick on stomach, fatigue, tired. Has stopped tylenol. Given oxycodone from Dr Harlow Mares and Dr Beather Arbour ( ED). Last oxycodone, he took last night, and went to sleep.Daughter is picking up the rx for lidocaine and oxycodone given from the ED.   Feels warm however states in the ED no fever.  Elevated inflammatory markers   Rheumatology appointment in 3 days   over the weekend he went to [facility] days ago and then was sent to [facility] emergency room for work-up of sepsis, left knee effusion. In the emergency room, unremarkable lactic acid and negative pro calcitonin. Discharged on oxycodone 5 mg every 6 hours, lidocaine patch and knee immobilizer MRI left knee showed moderate to large joint effusion with extensive synovitis.  No Baker's cyst.  Reactive subchondral marrow changes.  Extensive soft tissue edema about the knee Chest x-ray showed no active cardiopulmonary disease UTD- no bacteria Troponin x 2 negative.  AST, ALT decreasing.   History, background, results pertinent:   [place] left left negative DVT.  A/P/next steps:  Problem List Items Addressed This Visit      Other   Myalgia - Primary    Etiology unclear. COVID negative. WBCs  and liver enzymes improving.  For now, I have advised biofreeze, icy hot as he is limited in what he can take in regards to pain.  He cannot take NSAIDs due to eliquis.  Advised him the tramadol is largely metabolized by liver and I would not advise that as his liver enzymes are elevated.  He certainly may not take acetaminophen with current liver enzyme elevation.  He has Oxycodone for severe pain. Marland KitchenReferral to zack smith for second opinion and I have also sent him a staff message. Pending repeat labs, XRs.        Relevant Orders   DG Shoulder Left   DG Shoulder Right   DG HIP UNILAT WITH PELVIS 2-3 VIEWS LEFT   DG HIP UNILAT WITH PELVIS 2-3 VIEWS RIGHT   Ambulatory referral to Sports Medicine   CBC with Differential/Platelet   Comprehensive metabolic panel   C-reactive protein   Sedimentation rate      I spent 25 min  discussing plan of care over the phone.

## 2019-08-29 NOTE — Telephone Encounter (Signed)
Called and spoke with patient regards to his elevated liver enzymes, CRP sed rate and WBCs.  He has no fever, nausea or vomiting.  He does complain of diffuse muscular pain.  No cough shortness of breath respiratory symptoms. left knee is very swollen and even feels "hot" today.   He has been taken Tylenol quite a bit handfuls.  He was on prednisone about 3 weeks ago.  He is currently on 5 mg of oxycodone every 8 hours.  Denies any alcohol use or NSAIDs as he is on Eliquis.  Advised him to stop Lipitor and acetaminophen due to his liver enzymes.  Advised that most likely he needs an evaluation for septic knee, also Covid test due to low-grade fever noted 2 days prior , and in the setting of elevated platelets, WBCs question whether there is a viral illness component. he will go to urgent care and medicine for this.

## 2019-08-29 NOTE — Assessment & Plan Note (Addendum)
Etiology unclear. COVID negative. WBCs and liver enzymes improving.  For now, I have advised biofreeze, icy hot as he is limited in what he can take in regards to pain.  He cannot take NSAIDs due to eliquis.  Advised him the tramadol is largely metabolized by liver and I would not advise that as his liver enzymes are elevated.  He certainly may not take acetaminophen with current liver enzyme elevation.  He has Oxycodone for severe pain. Marland KitchenReferral to zack smith for second opinion and I have also sent him a staff message. Pending repeat labs, XRs.

## 2019-08-29 NOTE — Progress Notes (Deleted)
Subjective:    Patient ID: Jake Liu, male    DOB: [birth year], 60 y.o.   MRN: [id number]  CC: Jake Liu is a 59 y.o. male who presents today for an acute visit.    HPI: HPI ED follow up  Polyarthralgia Fever Elevated inflammatory markers   Rheumatology appointment in 3 days   over the weekend he went to urgent care [facility] days ago and then was sent to [facility] emergency room for work-up of sepsis, left knee effusion. In the emergency room, unremarkable lactic acid and negative pro calcitonin. Discharged on oxycodone 5 mg every 6 hours, lidocaine patch and knee immobilizer MRI left knee showed moderate to large joint effusion with extensive synovitis.  No Baker's cyst.  Reactive subchondral marrow changes.  Extensive soft tissue edema about the knee Chest x-ray showed no active cardiopulmonary disease  HISTORY:  Past Medical History:  Diagnosis Date  . Anginal pain ([place])   . Arthritis   . Atrial fibrillation ([place])   . Cardiomyopathy ([place])    EF: 45%  . CHF (congestive heart failure) ([place])   . GERD (gastroesophageal reflux disease)   . History of TIAs   . Hypertension   . Insomnia   . Sciatica   . Sleep apnea    Past Surgical History:  Procedure Laterality Date  . ABLATION N/A    for af  . COLONOSCOPY    . COLONOSCOPY WITH PROPOFOL N/A 10/13/2018   Procedure: COLONOSCOPY WITH PROPOFOL;  Surgeon: Toledo, [name], MD;  Location: [facility] ENDOSCOPY;  Service: Gastroenterology;  Laterality: N/A;  . ESOPHAGOGASTRODUODENOSCOPY    . ESOPHAGOGASTRODUODENOSCOPY (EGD) WITH PROPOFOL N/A 10/13/2018   Procedure: ESOPHAGOGASTRODUODENOSCOPY (EGD) WITH PROPOFOL;  Surgeon: Toledo, [name], MD;  Location: [facility] END[facility]COPY;  Service: Gastroenterology;  Laterality: N/A;  . HERNIA REPAIR    . KNEE SURGERY    . ROTATOR CUFF REPAIR     Family History  Problem Relation Age of Onset  . Heart disease Mother 62  . Asthma Father     Allergies: Sulfa antibiotics, Sulfasalazine, Bactrim  [sulfamethoxazole-trimethoprim], and Lisinopril Current Outpatient Medications on File Prior to Visit  Medication Sig Dispense Refill  . amLODipine (NORVASC) 5 MG tablet TAKE 1 TABLET(5 MG) BY MOUTH DAILY 90 tablet 4  . atorvastatin (LIPITOR) 80 MG tablet Take 1 tablet (80 mg total) by mouth daily. 90 tablet 3  . lidocaine (LIDODERM) 5 % Place 1 patch onto the skin daily. Remove & Discard patch within 12 hours or as directed by MD 30 patch 0  . metoprolol succinate (TOPROL-XL) 50 MG 24 hr tablet Take 1 tablet (50 mg total) by mouth daily. Take with or immediately following a meal. 30 tablet 3  . omeprazole (PRILOSEC) 40 MG capsule TAKE 1 CAPSULE(40 MG) BY MOUTH DAILY 30 capsule 3  . oxyCODONE 10 MG TABS Take 1 tablet (10 mg total) by mouth every 6 (six) hours as needed for severe pain. 20 tablet 0  . rivaroxaban (XARELTO) 20 MG TABS tablet Take by mouth.     No current facility-administered medications on file prior to visit.    Social History   Tobacco Use  . Smoking status: Former Research scientist (life sciences)  . Smokeless tobacco: Never Used  Substance Use Topics  . Alcohol use: Yes    Alcohol/week: 14.0 standard drinks    Types: 7 Glasses of wine, 7 Standard drinks or equivalent per week    Comment: drinks at night., bottle and half of red wine;  Drinks 10  cups coffee daily  . Drug use: No    Review of Systems    Objective:    There were no vitals taken for this visit.   Physical Exam     Assessment & Plan:      I am having Jake Liu maintain his metoprolol succinate, rivaroxaban, atorvastatin, amLODipine, omeprazole, Oxycodone HCl, and lidocaine.   No orders of the defined types were placed in this encounter.   Return precautions given.   Risks, benefits, and alternatives of the medications and treatment plan prescribed today were discussed, and patient expressed understanding.   Education regarding symptom management and diagnosis given to patient on AVS.  Continue to follow  with Burnard Hawthorne, FNP for routine health maintenance.   Jake Liu and I agreed with plan.   [name], FNP

## 2019-08-30 ENCOUNTER — Telehealth: Payer: Self-pay | Admitting: Family

## 2019-08-30 ENCOUNTER — Other Ambulatory Visit: Payer: Self-pay | Admitting: Family

## 2019-08-30 DIAGNOSIS — M791 Myalgia, unspecified site: Secondary | ICD-10-CM

## 2019-08-30 NOTE — Telephone Encounter (Signed)
I spoke with patient & made him aware that I made him appointment for tomorrow at 10:15 with [facility] Carlynn Spry, PA. Patient stated that appointment was doable for him. He also confirmed that he would be keeping his appointment with rheumatology as well as Hulan Saas.

## 2019-08-30 NOTE — Telephone Encounter (Signed)
I spoke with patient & he could not make appointment today at Dr. Alvira Philips office due to his job & having confernece calls. He did say I could call to make him an appointment for tomorrow & I have done so. He has an appointment with Carlynn Spry, PA at 10:15. I called to make patient aware of this but had to leave a VM.

## 2019-08-30 NOTE — Telephone Encounter (Signed)
Pt called returning your call 

## 2019-08-30 NOTE — Telephone Encounter (Signed)
Jake Liu emerge ortho and ask to speak to Dr Harlow Mares or his CMA  I sent him this message via Epic chat:  Dr Harlow Mares, Sorry to bother you. I am concerned about this patient and feel terrible for him in regards to his left knee pain, swelling. He has been to urgent care and [facility] over the weekend for knee pain and concerns for infection. I know you have pulled fluid 3 times from this knee , and per patient ( I cannot see all in Epic), fluid has been normal. In ED, he did have elevated WBCs, Sed Rate- some this I wonder if chronic ( see rheum per your referral on Thursday). In ED, he did not appear septic. He was supposed to have [place] office visit with me yesterday as follow up but didnt have ride so we had to speak over the phone. Do you have any availability to see him or someone in your clinic? [phone]  Liu pt Advise him that we are working on this. If he thinks knee joint is infected, he needs a tap and IV antibiotics for a septic joint.  Does he have fever? Is the swelling more significant today? How are his shoulder pain- the same? Is he getting  Labs, xray at [facility] today?

## 2019-08-30 NOTE — Telephone Encounter (Signed)
Pt is needing to know which medications he should be taking? He said he was on so many that he is unsure which ones he should be taking. Please call patient for more information.

## 2019-08-30 NOTE — Telephone Encounter (Signed)
Call Dr Harlow Mares office They openings in their office after 1:00 TODAY.  Please schedule this for patient and call pt  Please advise him this is the most appropriate place for him to go if he has concern for infection in his knee.  Advised him that I have been in touch with Dr. Harlow Mares and told him of my concerns.  Please advise him to keep rheumatology and also appointment with Dr. Gardenia Phlegm in [place].

## 2019-08-30 NOTE — Telephone Encounter (Signed)
I called patient & he is very concerned that he isn't on an antibiotic to fight any infection. He said that his knee is still so swollen as well as his ankle. He stated the only thing that he has been given was oxycodone, which makes him feel sick. He wanted to know your opinion on him being on something to fight infection. He sees rheumatology on Thursday & Hulan Saas on 2/9.

## 2019-08-31 ENCOUNTER — Other Ambulatory Visit: Payer: Self-pay | Admitting: Orthopedic Surgery

## 2019-08-31 DIAGNOSIS — M7122 Synovial cyst of popliteal space [Baker], left knee: Secondary | ICD-10-CM

## 2019-09-01 DIAGNOSIS — M2550 Pain in unspecified joint: Secondary | ICD-10-CM | POA: Insufficient documentation

## 2019-09-01 DIAGNOSIS — R748 Abnormal levels of other serum enzymes: Secondary | ICD-10-CM | POA: Insufficient documentation

## 2019-09-01 DIAGNOSIS — M25462 Effusion, left knee: Secondary | ICD-10-CM | POA: Insufficient documentation

## 2019-09-01 DIAGNOSIS — F1010 Alcohol abuse, uncomplicated: Secondary | ICD-10-CM | POA: Insufficient documentation

## 2019-09-01 LAB — CULTURE, BLOOD (ROUTINE X 2)
Culture: NO GROWTH
Culture: NO GROWTH
Special Requests: ADEQUATE
Special Requests: ADEQUATE

## 2019-09-02 ENCOUNTER — Other Ambulatory Visit: Payer: Self-pay | Admitting: Radiology

## 2019-09-02 NOTE — Progress Notes (Signed)
Pt called and instructed not to take his Alen Blew Sunday and plan to resume Monday ofter his Baker Cyst aspirtation

## 2019-09-05 ENCOUNTER — Other Ambulatory Visit: Payer: Self-pay

## 2019-09-05 ENCOUNTER — Ambulatory Visit
Admission: RE | Admit: 2019-09-05 | Discharge: 2019-09-05 | Disposition: A | Discharge status: Home / Self Care | Payer: Commercial Managed Care - PPO | Source: Ambulatory Visit | Attending: Orthopedic Surgery | Admitting: Orthopedic Surgery

## 2019-09-05 ENCOUNTER — Telehealth: Payer: Self-pay | Admitting: Family

## 2019-09-05 DIAGNOSIS — M25462 Effusion, left knee: Secondary | ICD-10-CM | POA: Diagnosis not present

## 2019-09-05 DIAGNOSIS — M7122 Synovial cyst of popliteal space [Baker], left knee: Secondary | ICD-10-CM

## 2019-09-05 IMAGING — US US DRAIN/INJ LARGE JOINT/BURSA
1 series · 7 of 7 positions shown · non-contrast
Comparison: None.

CLINICAL DATA: Recurrent painful left knee effusion post
arthrocentesis x3

EXAM:
US DRAIN/INJ LARGE JOINT/BURSA
TECHNIQUE: Survey ultrasound of the popliteal fossa was performed and no
significant Baker's cyst identified. This was consistent with
findings of recent MR knee [DATE]. Findings were reviewed with
Dr. KAYY. Subsequently, the moderate effusion in the suprapatellar
bursa was localized and appropriate lateral skin entry point
determined and marked. Region prepped with Betadine, draped in usual
sterile fashion, infiltrated locally with 1% lidocaine. 18 gauge
spinal needle advanced into the suprapatellar bursa but scant fluid
returned. Needle removed and a 5 French multi sidehole KAYY
needle advanced. Approximately approximately 4ml of clear
straw-colored fluid were aspirated. This was sent for the requested
laboratory studies including culture and cell count. Patient
tolerated procedure and was discharged home in good condition.

[Series 1: us drain/inj large joint/bursa · 0.12mm/px · 7 of 7 slices shown]
[im 1/7]
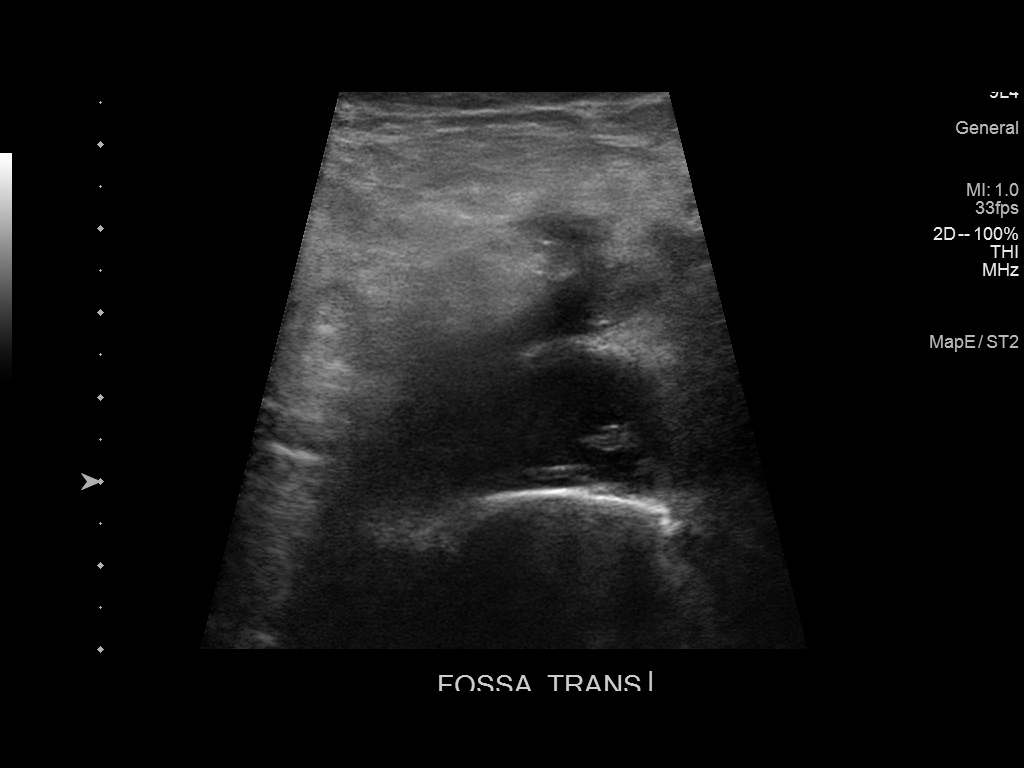
[im 2/7]
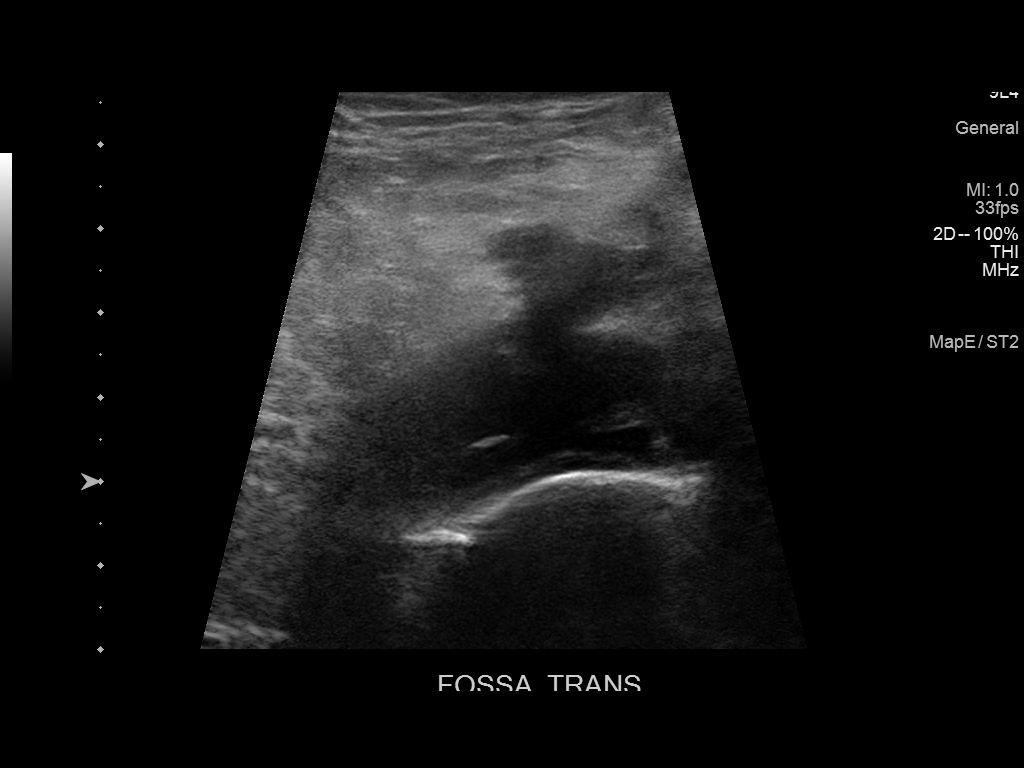
[im 3/7]
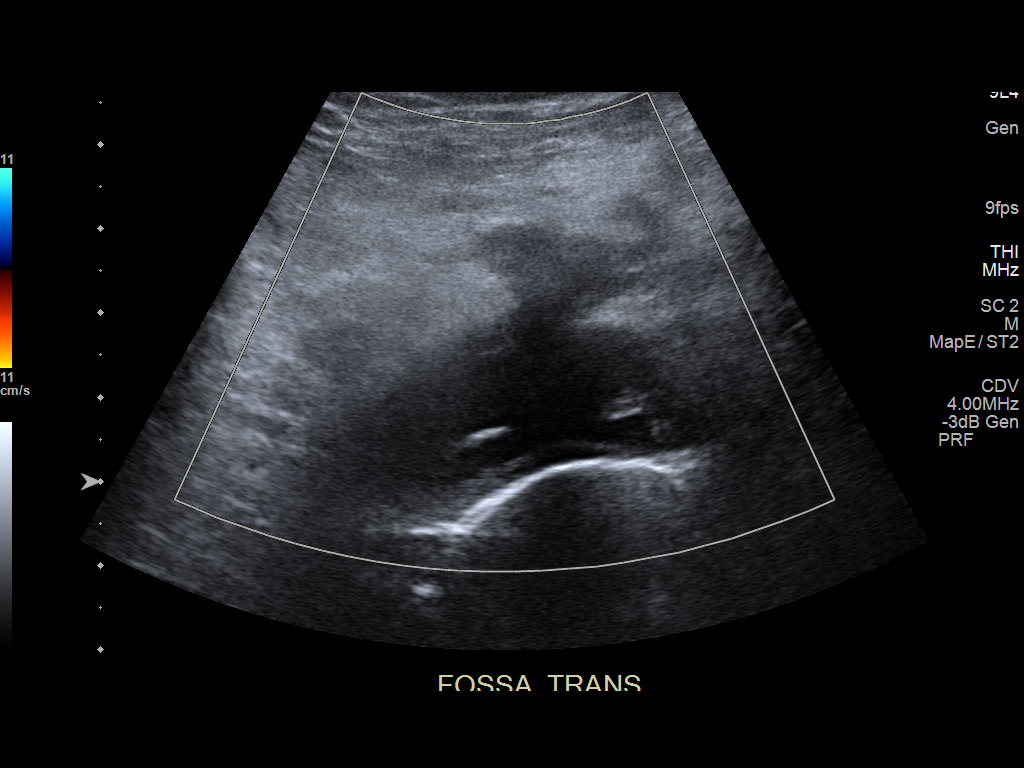
[im 4/7]
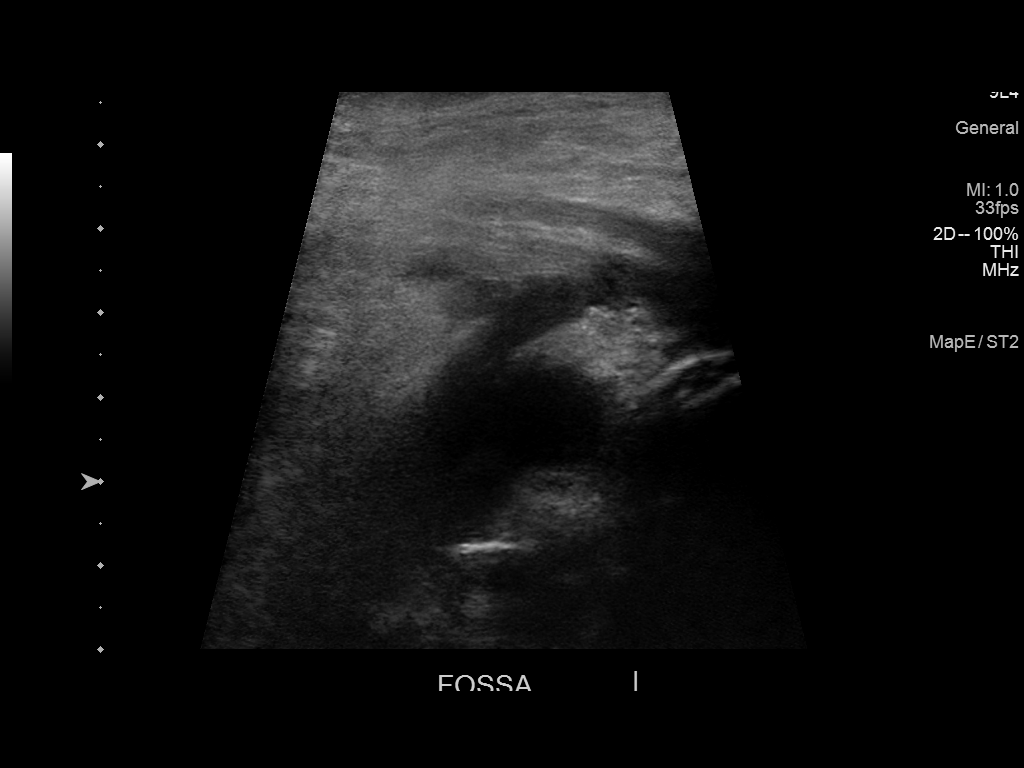
[im 5/7]
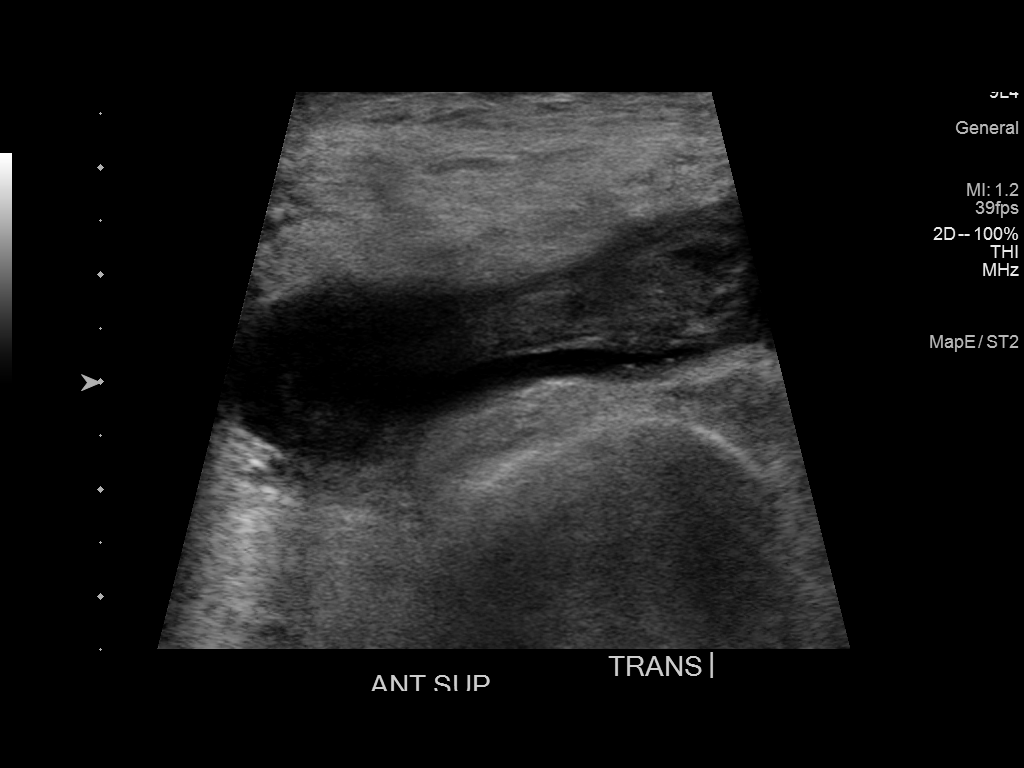
[im 6/7]
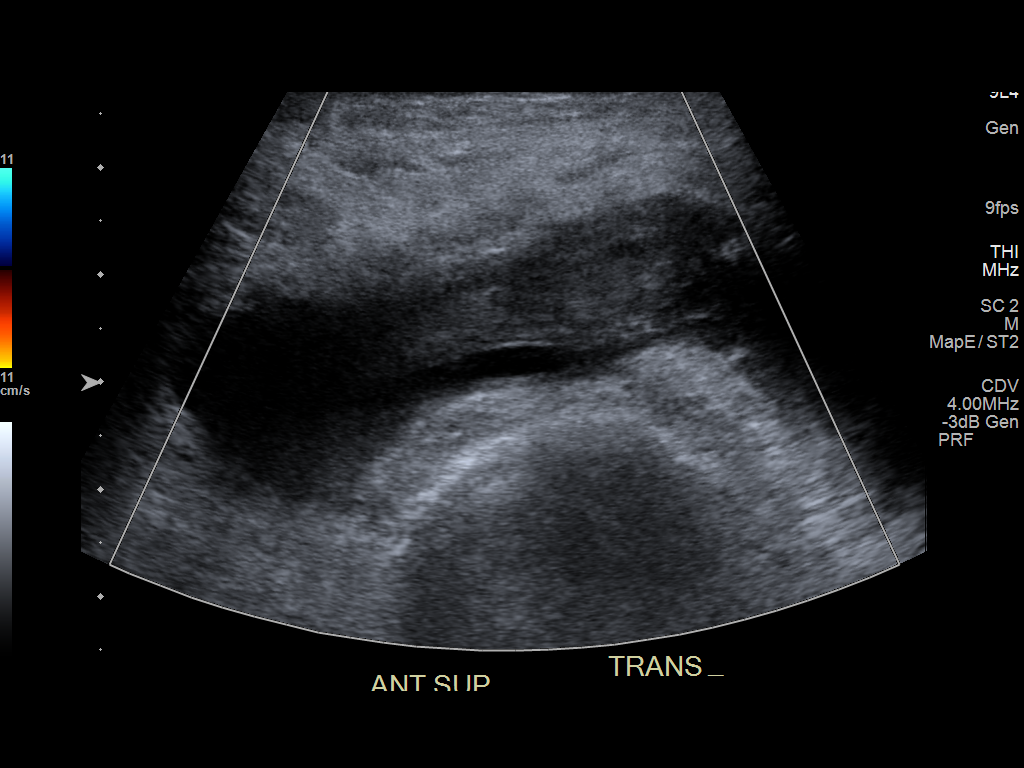
[im 7/7]
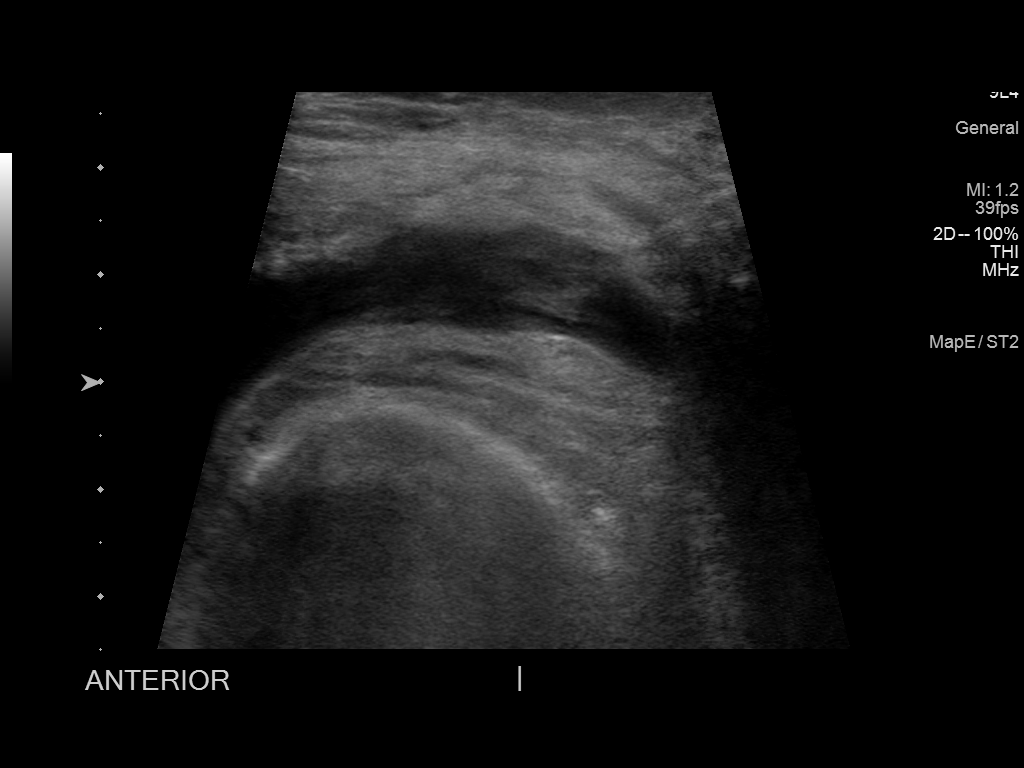

[7 of 7 positions shown; findings below may reference images not displayed]

IMPRESSION: 1. Technically successful aspiration left suprapatellar bursa.
2. No Baker's cyst identified.

## 2019-09-05 MED ORDER — METHYLPREDNISOLONE ACETATE 80 MG/ML IJ SUSP
INTRAMUSCULAR | Status: AC
  Filled 2019-09-05: qty 1

## 2019-09-05 NOTE — Progress Notes (Deleted)
Jake Liu [address] [place] Phone: [phone] Subjective:    I'm seeing this patient by the request  of:  Arnett, Yvetta Coder, FNP  CC:   [id number]  Jake Liu is a 59 y.o. male coming in with complaint of ***  Onset-  Location Duration-  Character- Aggravating factors- Reliving factors-  Therapies tried-  Severity-     Past Medical History:  Diagnosis Date  . Anginal pain ([place])   . Arthritis   . Atrial fibrillation ([place])   . Cardiomyopathy ([place])    EF: 45%  . CHF (congestive heart failure) ([place])   . GERD (gastroesophageal reflux disease)   . History of TIAs   . Hypertension   . Insomnia   . Sciatica   . Sleep apnea    Past Surgical History:  Procedure Laterality Date  . ABLATION N/A    for af  . COLONOSCOPY    . COLONOSCOPY WITH PROPOFOL N/A 10/13/2018   Procedure: COLONOSCOPY WITH PROPOFOL;  Surgeon: Toledo, [name], MD;  Location: [facility] ENDOSCOPY;  Service: Gastroenterology;  Laterality: N/A;  . ESOPHAGOGASTRODUODENOSCOPY    . ESOPHAGOGASTRODUODENOSCOPY (EGD) WITH PROPOFOL N/A 10/13/2018   Procedure: ESOPHAGOGASTRODUODENOSCOPY (EGD) WITH PROPOFOL;  Surgeon: Toledo, [name], MD;  Location: [facility] ENDOSCOPY;  Service: Gastroenterology;  Laterality: N/A;  . HERNIA REPAIR    . KNEE SURGERY    . ROTATOR CUFF REPAIR     Social History   Socioeconomic History  . Marital status: Single    Spouse name: Not on file  . Number of children: Not on file  . Years of education: Not on file  . Highest education level: Not on file  Occupational History  . Not on file  Tobacco Use  . Smoking status: Former Research scientist (life sciences)  . Smokeless tobacco: Never Used  Substance and Sexual Activity  . Alcohol use: Yes    Alcohol/week: 14.0 standard drinks    Types: 7 Glasses of wine, 7 Standard drinks or equivalent per week    Comment: drinks at night., bottle and half of red wine;  Drinks 10 cups coffee daily  . Drug use:  No  . Sexual activity: Not on file  Other Topics Concern  . Not on file  Social History Narrative   Sales, fuels.    Girlfriend   Children      Social Determinants of Health   Financial Resource Strain:   . Difficulty of Paying Living Expenses: Not on file  Food Insecurity:   . Worried About Charity fundraiser in the Last Year: Not on file  . Ran Out of Food in the Last Year: Not on file  Transportation Needs:   . Lack of Transportation (Medical): Not on file  . Lack of Transportation (Non-Medical): Not on file  Physical Activity:   . Days of Exercise per Week: Not on file  . Minutes of Exercise per Session: Not on file  Stress:   . Feeling of Stress : Not on file  Social Connections:   . Frequency of Communication with Friends and Family: Not on file  . Frequency of Social Gatherings with Friends and Family: Not on file  . Attends Religious Services: Not on file  . Active Member of Clubs or Organizations: Not on file  . Attends Archivist Meetings: Not on file  . Marital Status: Not on file   Allergies  Allergen Reactions  . Sulfa Antibiotics Hives    rash   .  Sulfasalazine Hives  . Bactrim [Sulfamethoxazole-Trimethoprim]     Sulfa/Rash  . Lisinopril     cough   Family History  Problem Relation Age of Onset  . Heart disease Mother 50  . Asthma Father      Current Outpatient Medications (Cardiovascular):  .  amLODipine (NORVASC) 5 MG tablet, TAKE 1 TABLET(5 MG) BY MOUTH DAILY .  metoprolol succinate (TOPROL-XL) 50 MG 24 hr tablet, Take 1 tablet (50 mg total) by mouth daily. Take with or immediately following a meal.    Current Outpatient Medications (Hematological):  .  rivaroxaban (XARELTO) 20 MG TABS tablet, Take by mouth.  Current Outpatient Medications (Other):  .  lidocaine (LIDODERM) 5 %, Place 1 patch onto the skin daily. Remove & Discard patch within 12 hours or as directed by MD .  omeprazole (PRILOSEC) 40 MG capsule, TAKE 1 CAPSULE(40  MG) BY MOUTH DAILY   Reviewed prior external information including notes and imaging from  primary care provider As well as notes that were available from care everywhere and other healthcare systems.  Past medical history, social, surgical and family history all reviewed in electronic medical record.  No pertanent information unless stated regarding to the chief complaint.   Review of Systems:  No headache, visual changes, nausea, vomiting, diarrhea, constipation, dizziness, abdominal pain, skin rash, fevers, chills, night sweats, weight loss, swollen lymph nodes, body aches, joint swelling, chest pain, shortness of breath, mood changes. POSITIVE muscle aches  Objective  There were no vitals taken for this visit.   General: No apparent distress alert and oriented x3 mood and affect normal, dressed appropriately.  HEENT: Pupils equal, extraocular movements intact  Respiratory: Patient's speak in full sentences and does not appear short of breath  Cardiovascular: No lower extremity edema, non tender, no erythema  Skin: Warm dry intact with no signs of infection or rash on extremities or on axial skeleton.  Abdomen: Soft nontender  Neuro: Cranial nerves II through XII are intact, neurovascularly intact in all extremities with 2+ DTRs and 2+ pulses.  Lymph: No lymphadenopathy of posterior or anterior cervical chain or axillae bilaterally.  Gait normal with good balance and coordination.  MSK:  Non tender with full range of motion and good stability and symmetric strength and tone of shoulders, elbows, wrist, hip, knee and ankles bilaterally.     Impression and Recommendations:     This case required medical decision making of moderate complexity. The above documentation has been reviewed and is accurate and complete Lyndal Pulley, DO       Note: This dictation was prepared with Dragon dictation along with smaller phrase technology. Any transcriptional errors that result from this  process are unintentional.

## 2019-09-05 NOTE — Telephone Encounter (Signed)
Patient needs to talk to [place]. Patient went into hospital to have his knee drained. Doctor at hospital stated that patient does not have a bakers cyst. Knee was drained.

## 2019-09-05 NOTE — Procedures (Signed)
  Procedure: [place] L knee aspiration   EBL:   minimal Complications:  none immediate  See full dictation in [facility].  Dillard Cannon MD Main # [phone] Pager  [phone]

## 2019-09-06 ENCOUNTER — Ambulatory Visit: Payer: Commercial Managed Care - PPO | Admitting: Family Medicine

## 2019-09-06 NOTE — Telephone Encounter (Signed)
Pt was returning your call.

## 2019-09-06 NOTE — Telephone Encounter (Signed)
Would you like pt to schedule an appt to discuss?

## 2019-09-06 NOTE — Telephone Encounter (Signed)
Call pt Get more information; ask if needs visit.  He can come in person if would like and you can use a same day appt this week.

## 2019-09-06 NOTE — Telephone Encounter (Signed)
LMTCB

## 2019-09-07 NOTE — Telephone Encounter (Signed)
Spoke with pt and he stated that Dr. Pleas Patricia the surgeon sent the pt to the hospital to have his baker's csyt drained. The doctor at the hospital called Dr. Pleas Patricia and told him that there was no fluid in the baker's csyt. The pt stated that the surgeon told the doctor to go ahead and drain it any ways. The doctor at the hospital tried draining it and the pt stated that he got a little bit off of the cyst but it was thick gel like. The pt is concerned because he is afraid of getting an infection. Pt is really wanting the speak with you to get your opinion on what to do because his knee is swelling now.

## 2019-09-07 NOTE — Telephone Encounter (Signed)
Please return call.

## 2019-09-07 NOTE — Telephone Encounter (Signed)
Seen by ( what appears to be Carlynn Spry) at [place]  2 days ago with US guided arthrocentesis, however unable to get any fluid off. Patient describes as 'gel'.  No growth seen on aerobic culture 09/05/19.  Has seen Dr Meda Coffee last week and waiting on results from labs. Given prednisone and had to stop taking it.   Able to walk on leg now. Pain is 'not bad'. No fever. Taking mobic. Not on narcotics.   Muscles in arms have gotten better. Shoulders have gotten better.  Has 'stopped locking up' since off the lipitor.   Plans to have labs ( ordered ) from me at [facility] in the next couple days.

## 2019-09-08 ENCOUNTER — Other Ambulatory Visit: Payer: Self-pay

## 2019-09-08 ENCOUNTER — Ambulatory Visit (INDEPENDENT_AMBULATORY_CARE_PROVIDER_SITE_OTHER): Payer: Commercial Managed Care - PPO | Admitting: Family Medicine

## 2019-09-08 ENCOUNTER — Encounter: Payer: Self-pay | Admitting: Family Medicine

## 2019-09-08 ENCOUNTER — Ambulatory Visit (INDEPENDENT_AMBULATORY_CARE_PROVIDER_SITE_OTHER): Payer: Commercial Managed Care - PPO

## 2019-09-08 VITALS — BP 124/78 | HR 125 | Ht 72.0 in | Wt 210.0 lb

## 2019-09-08 DIAGNOSIS — M25511 Pain in right shoulder: Secondary | ICD-10-CM

## 2019-09-08 DIAGNOSIS — M791 Myalgia, unspecified site: Secondary | ICD-10-CM | POA: Diagnosis not present

## 2019-09-08 DIAGNOSIS — M545 Low back pain: Secondary | ICD-10-CM

## 2019-09-08 DIAGNOSIS — K760 Fatty (change of) liver, not elsewhere classified: Secondary | ICD-10-CM | POA: Diagnosis not present

## 2019-09-08 DIAGNOSIS — G8929 Other chronic pain: Secondary | ICD-10-CM

## 2019-09-08 DIAGNOSIS — M25512 Pain in left shoulder: Secondary | ICD-10-CM

## 2019-09-08 IMAGING — DX DG SHOULDER 2+V*L*
3 series · 3 of 3 positions shown · non-contrast
Comparison: None.

CLINICAL DATA: Bilateral shoulder pain and lack of range of motion

EXAM:
LEFT SHOULDER - 2+ VIEW

[shoulder (grashey view) ap]
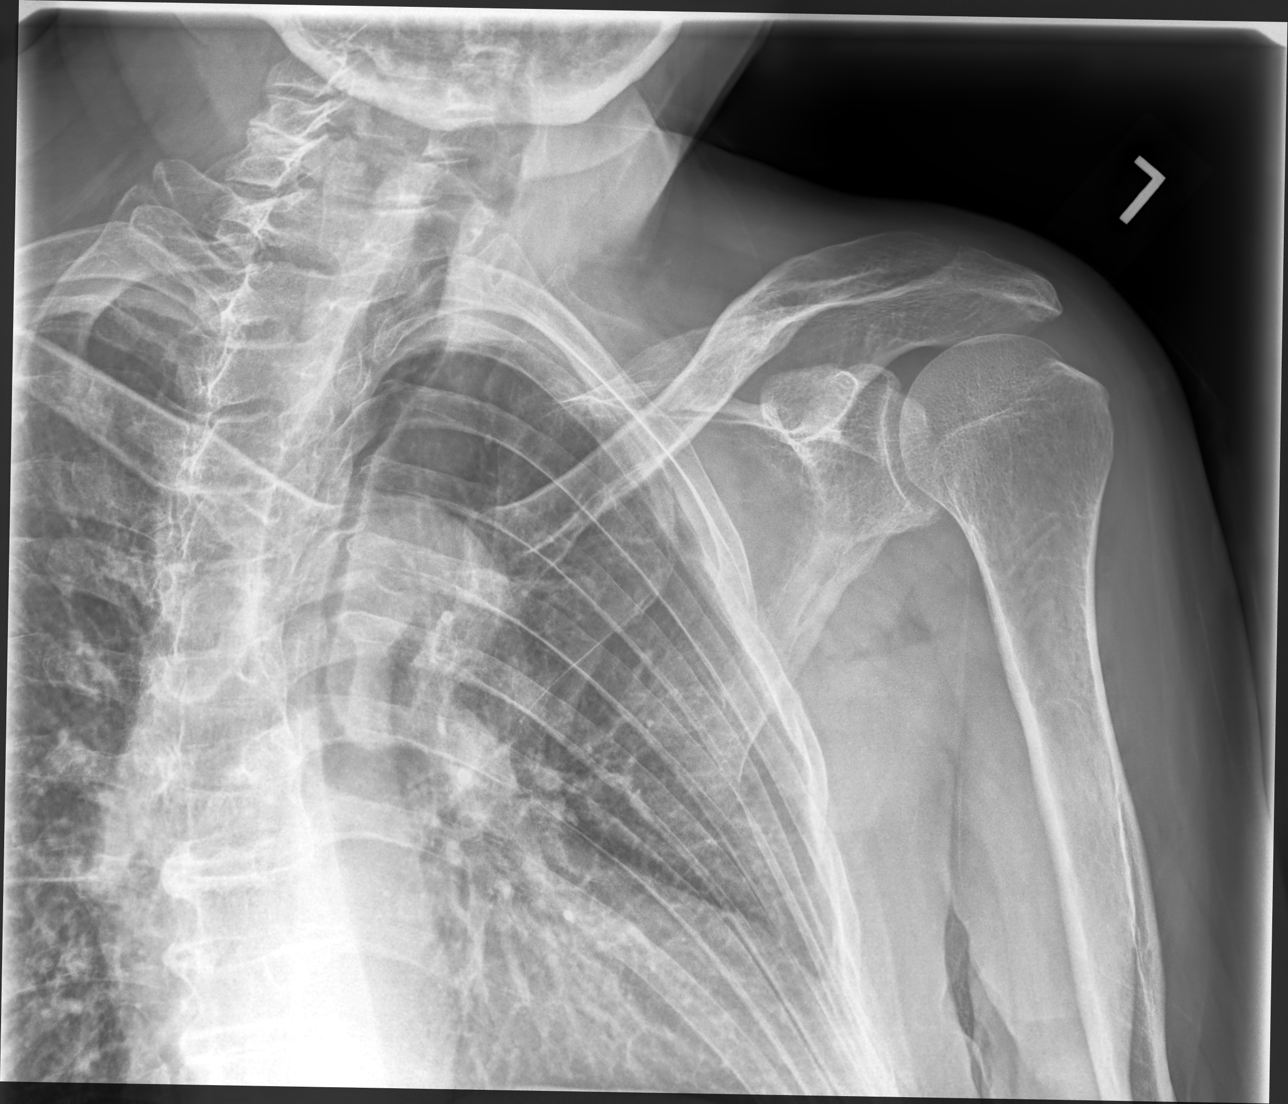

[shoulder (y view) lat]
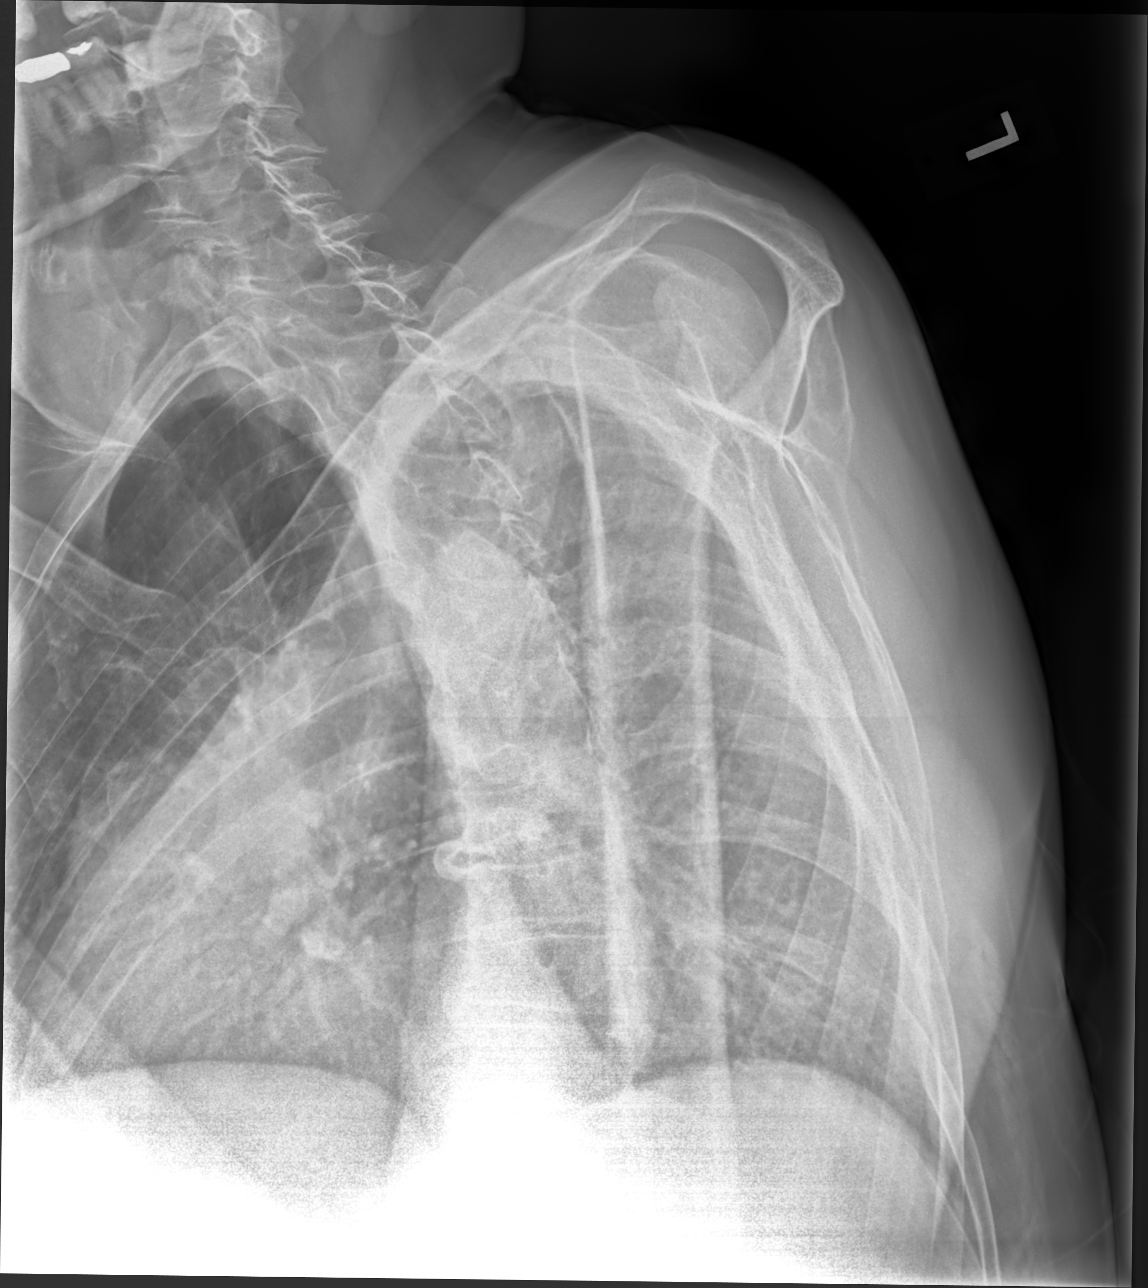

[shoulder axial 45°]
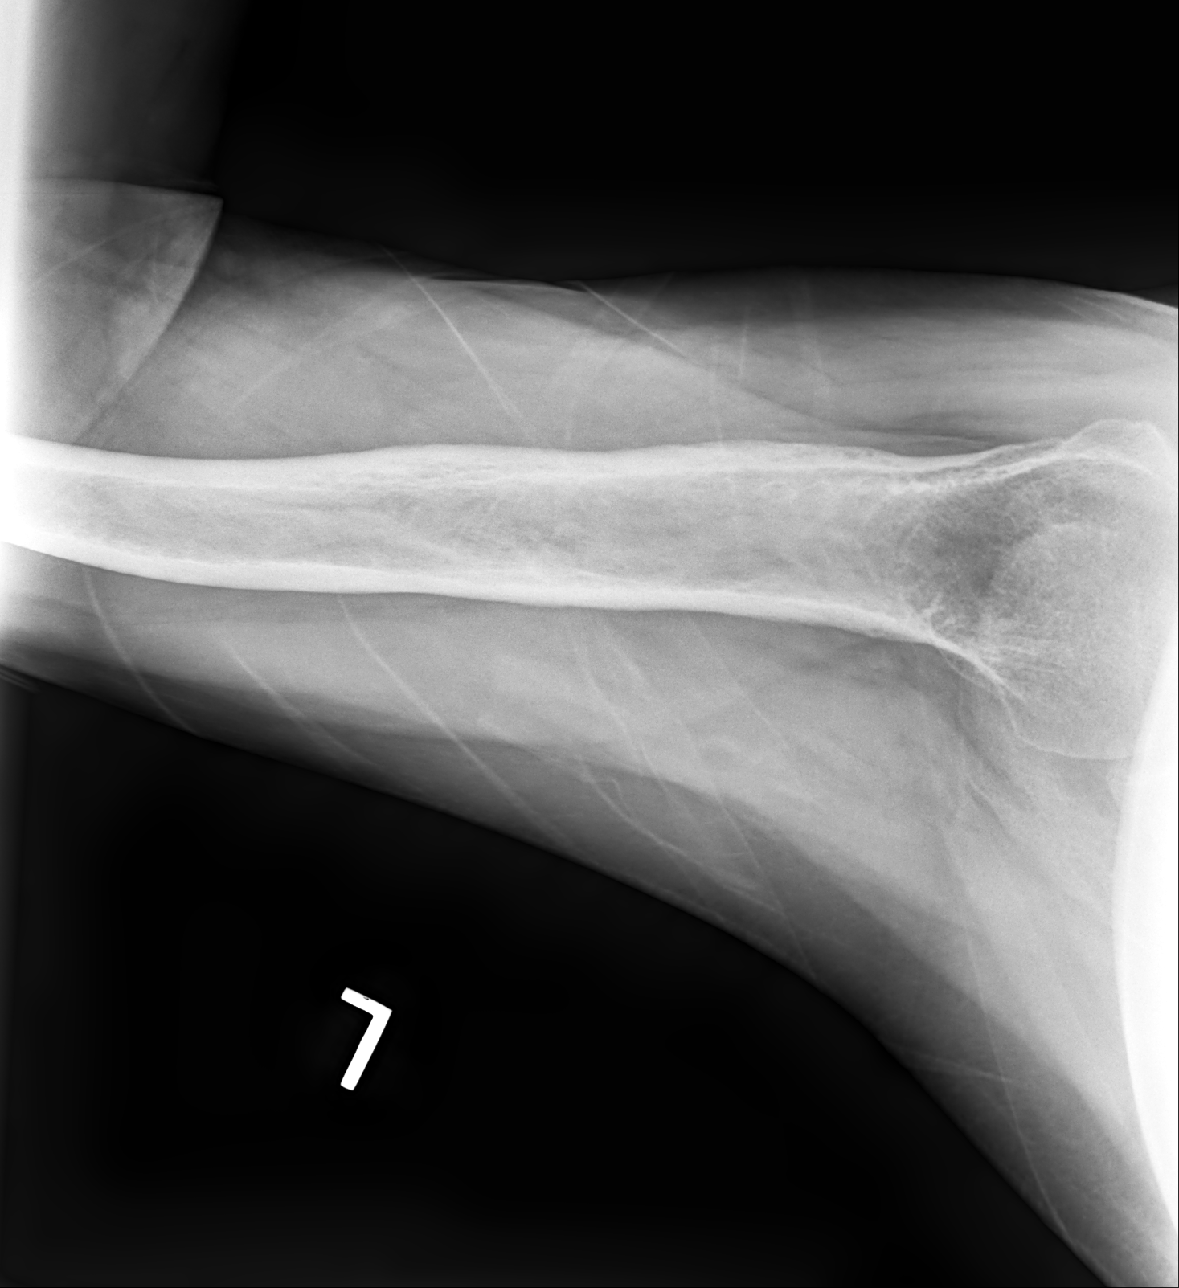

[3 of 3 positions shown; findings below may reference images not displayed]

FINDINGS: The osseous structures appear diffusely demineralized which may
limit detection of small or nondisplaced fractures. No acute bony
abnormality. Specifically, no fracture, subluxation, or dislocation.
Mild glenohumeral acromioclavicular arthrosis. Slightly high-riding
appearance of the humeral head.
IMPRESSION: No acute fracture or traumatic malalignment.

Mildly high-riding appearance of the humeral head may reflect
rotator cuff insufficiency.

## 2019-09-08 IMAGING — DX DG SHOULDER 2+V*R*
3 series · 3 of 3 positions shown · non-contrast
Comparison: Radiograph [DATE]

CLINICAL DATA: Bilateral shoulder pain with decreased range of
motion

EXAM:
RIGHT SHOULDER - 2+ VIEW

[shoulder (grashey view) ap]
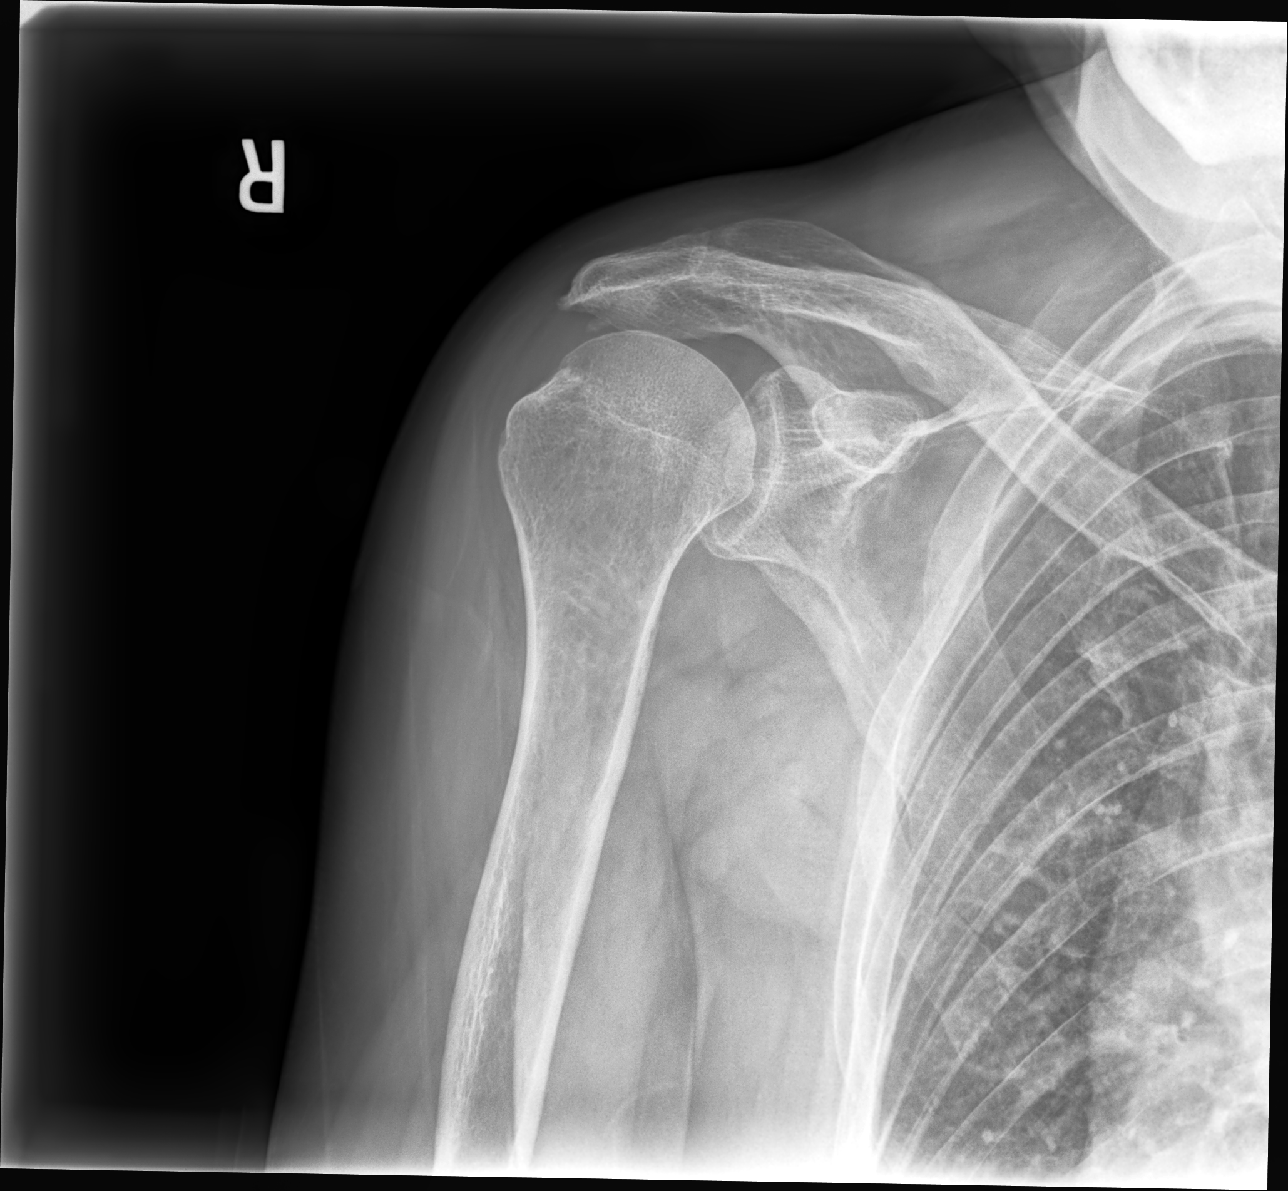

[shoulder (y view) lat]
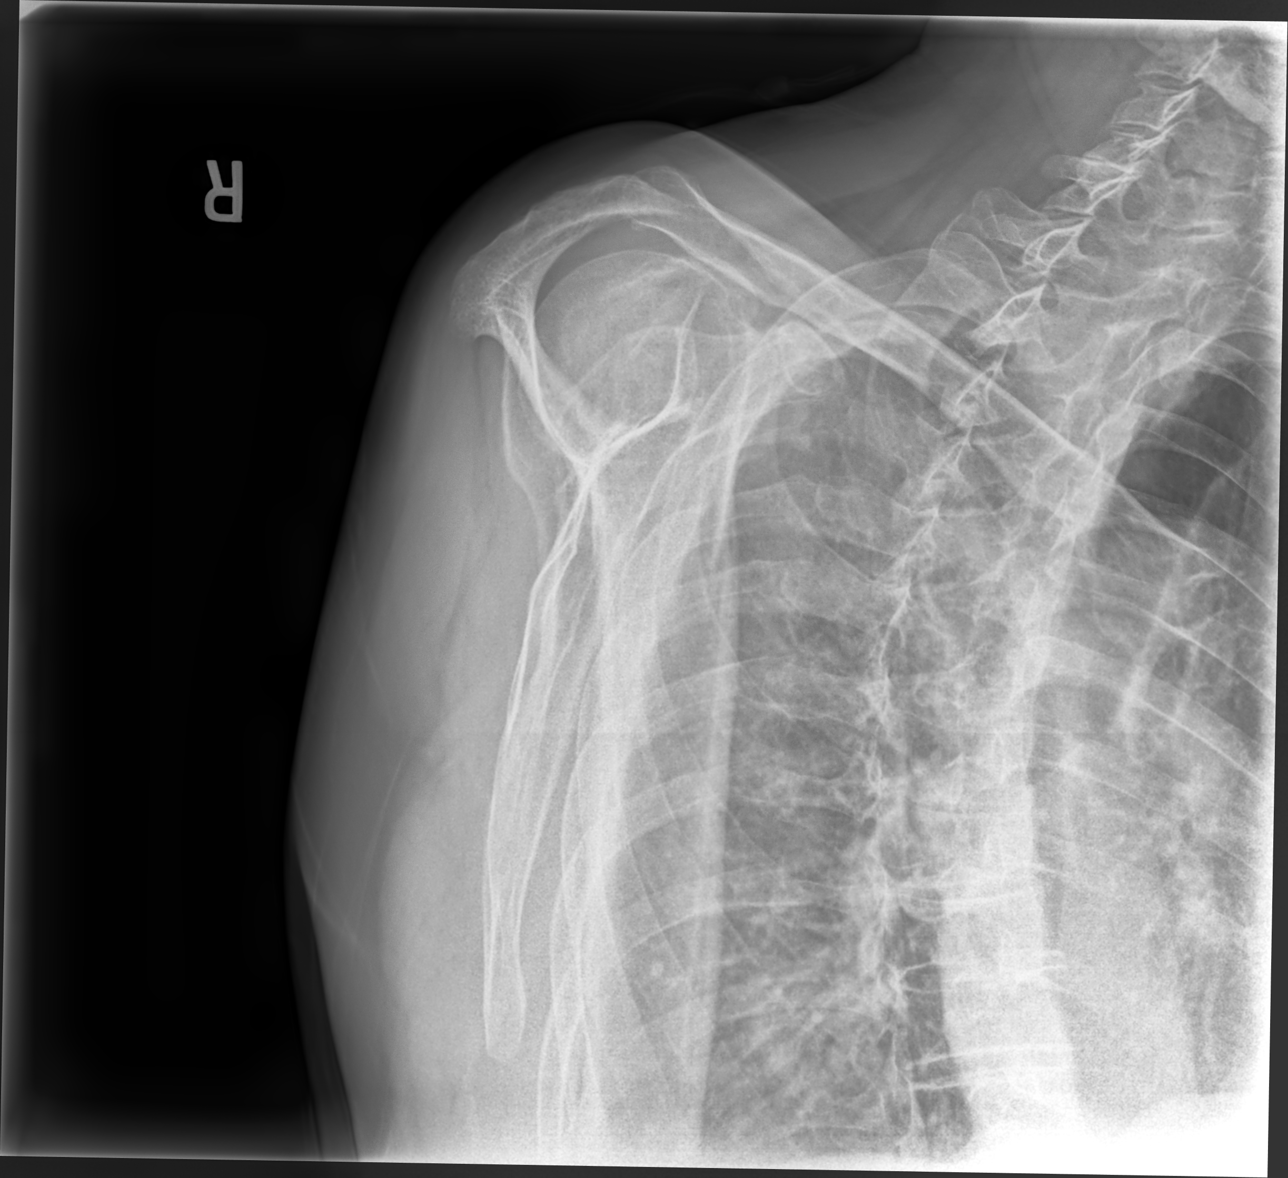

[shoulder axial 45°]
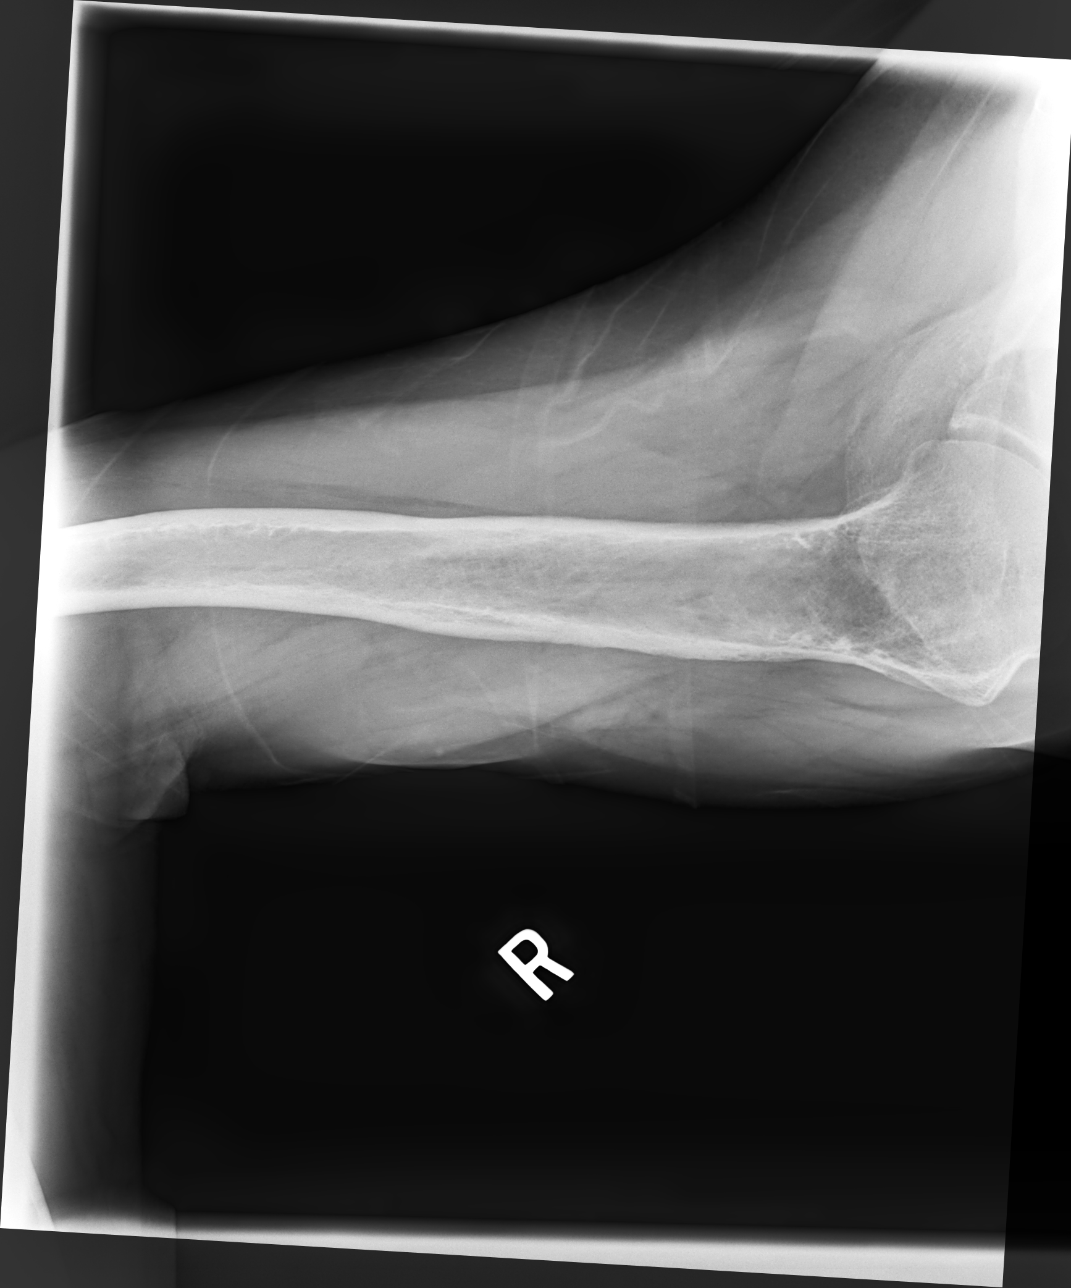

[3 of 3 positions shown; findings below may reference images not displayed]

FINDINGS: The osseous structures appear diffusely demineralized which may
limit detection of small or nondisplaced fractures. No acute osseous
abnormality or traumatic malalignment. Mild elevation of the humeral
head is present and a background mild to moderate acromioclavicular
and glenohumeral degenerative changes. Some undersurface spurring of
the acromion is present.
IMPRESSION: High-riding appearance of the humeral head with some undersurface
spurring of the acromion may reflect a constellation of rotator cuff
insufficiency and features of subacromial impingement given the mid
range of motion.

Background of mild to moderate glenohumeral acromioclavicular
arthrosis.

Diffuse bony demineralization.

## 2019-09-08 NOTE — Progress Notes (Signed)
Subjective:    I'm seeing this patient as a consultation for:  Jake Liu, [name]. Note will be routed back to referring provider/PCP.  CC: Polyarthralgia  I, Wendy Poet, LAT, ATC, am serving as scribe for Dr. Lynne Leader.  HPI: Pt is a 59 y/o male presenting w/ c/o polyarthralgia.  Per his OV note w/ his PCP on 08/29/19, he notes R groin pain when transitioning from sit-to-stand, B shoulder pain and L knee pain and swelling.  He states feeling like his muscles are "tearing."  Pt went to the ED on 08/27/19 for L effusion and had a L knee MRI on 08/28/19 that showed knee joint effusion and extensive synovitis and knee arthritis.  Pt has also been seen by rheumatology on 09/01/19.  He has tried Lidocaine patches, oxycodone and a steroid dose pack.  He presents today w/ chief c/o con't L knee pain and swelling.  His L knee pain has been drained 3x over the past 2 months.  Today his biggest c/o is B shoulder and arm pain and his L knee pain and swelling.  He states that he is having pain, weakness and decrased ROM in his B shoulders.  He denies any neck pain or numbness/tingling into his B UEs.  L knee: He is currently having pain in his L knee along the anterior-medial knee and L posterior distal thigh and calf.  He is also having knee swelling and cramping in his L leg.  He also has L ankle and foot swelling.  He has been wearing compression.  He has had extensive work-up so far with multiple different providers including orthopedic surgery, most recently rheumatology.  He recently had extensive rheumatology labs which showed mildly elevated CRP but normal sed rate another normal rheumatologic factors.  He also recently had left knee aspiration as above so far showing negative culture 3 days in.  He has had multiple tense steroid injections of his knee with little benefit.  His PCP recently advised him to stop taking atorvastatin.  He discontinued this medication 1.5 weeks ago which has helped some.  He  tried stopping the amlodipine which did help also.  He restarted amlodipine and the pain returned.     Past medical history, Surgical history, Family history, Social history, Allergies, and medications have been entered into the medical record, reviewed.   Review of Systems: No fever or chills  Objective:    Vitals:   09/08/19 1354  BP: 124/78  Pulse: (!) 125  SpO2: 94%   General: Well Developed, well nourished, and in no acute distress.  Neuro/Psych: Alert and oriented x3, extra-ocular muscles intact, able to move all 4 extremities, sensation grossly intact. Skin: Warm and dry, no rashes noted.  Respiratory: Not using accessory muscles, speaking in full sentences, trachea midline.  Cardiovascular: Pulses palpable, no extremity edema. Abdomen: Does not appear distended. MSK:  Left knee: Moderate effusion diffusely tender decreased motion. Right shoulder: Normal-appearing diffusely tender.  Significant lack of range of motion external rotation and abduction active and passive. Left shoulder: Normal.  Diffusely tender.  Significant lack range of motion external rotation and abduction active and passive. Decreased hip motion bilaterally. Diffusely tender across multiple muscle groups upper and lower extremities.   Lab and Radiology Results No results found for this or any previous visit (from the past 72 hour(s)). DG Shoulder Right  Result Date: 09/08/2019 CLINICAL DATA:  Bilateral shoulder pain with decreased range of motion EXAM: RIGHT SHOULDER - 2+ VIEW COMPARISON:  Radiograph March 04, 2014 FINDINGS: The osseous structures appear diffusely demineralized which may limit detection of small or nondisplaced fractures. No acute osseous abnormality or traumatic malalignment. Mild elevation of the humeral head is present and a background mild to moderate acromioclavicular and glenohumeral degenerative changes. Some undersurface spurring of the acromion is present. IMPRESSION:  High-riding appearance of the humeral head with some undersurface spurring of the acromion may reflect a constellation of rotator cuff insufficiency and features of subacromial impingement given the mid range of motion. Background of mild to moderate glenohumeral acromioclavicular arthrosis. Diffuse bony demineralization. Electronically Signed   By: Lovena Le M.D.   On: 09/08/2019 23:17   DG Shoulder Left  Result Date: 09/08/2019 CLINICAL DATA:  Bilateral shoulder pain and lack of range of motion EXAM: LEFT SHOULDER - 2+ VIEW COMPARISON:  None. FINDINGS: The osseous structures appear diffusely demineralized which may limit detection of small or nondisplaced fractures. No acute bony abnormality. Specifically, no fracture, subluxation, or dislocation. Mild glenohumeral acromioclavicular arthrosis. Slightly high-riding appearance of the humeral head. IMPRESSION: No acute fracture or traumatic malalignment. Mildly high-riding appearance of the humeral head may reflect rotator cuff insufficiency. Electronically Signed   By: Lovena Le M.D.   On: 09/08/2019 23:18   [place] DRAIN/INJ MAJOR JOINT/BURSA  Result Date: 09/05/2019 CLINICAL DATA:  Recurrent painful left knee effusion post arthrocentesis x3 EXAM: [place] DRAIN/INJ LARGE JOINT/BURSA COMPARISON:  None. TECHNIQUE: Survey ultrasound of the popliteal fossa was performed and no significant Baker's cyst identified. This was consistent with findings of recent MR knee 08/28/2019. Findings were reviewed with Dr. Harlow Mares. Subsequently, the moderate effusion in the suprapatellar bursa was localized and appropriate lateral skin entry point determined and marked. Region prepped with Betadine, draped in usual sterile fashion, infiltrated locally with 1% lidocaine. 18 gauge spinal needle advanced into the suprapatellar bursa but scant fluid returned. Needle removed and a 5 [place] multi sidehole Yueh sheath needle advanced. Approximately approximately 69m of clear straw-colored  fluid were aspirated. This was sent for the requested laboratory studies including culture and cell count. Patient tolerated procedure and was discharged home in good condition. IMPRESSION: 1. Technically successful aspiration left suprapatellar bursa. 2. No Baker's cyst identified. Electronically Signed   By: DLucrezia EuropeM.D.   On: 09/05/2019 15:07   CLINICAL DATA:  Knee pain and swelling. Low-grade fever. History of arthrocentesis  EXAM: MRI OF THE LEFT KNEE WITHOUT CONTRAST  TECHNIQUE: Multiplanar, multisequence MR imaging of the knee was performed. No intravenous contrast was administered.  COMPARISON:  Left knee x-ray 08/06/2019  FINDINGS: Technical note: Motion degraded examination.  MENISCI  Medial meniscus: Intrasubstance degeneration without discrete fluid signal tear standing to an articular surface.  Lateral meniscus: Intrasubstance degeneration without discrete tear.  LIGAMENTS  Cruciates:  ACL and PCL intact with probable mucinous degeneration.  Collaterals: Medial collateral ligament is intact. Lateral collateral ligament complex is intact.  CARTILAGE  Patellofemoral: Mild to moderate chondral thinning of the inferior aspect of the lateral patellar facet and lateral trochlea.  Medial: Mild chondral thinning and surface irregularity of the weight-bearing surfaces.  Lateral:  No chondral defect.  Joint: Moderate to large joint effusion with extensive synovitis. Edema within Hoffa's fat.  Popliteal Fossa: No Baker's cyst. Popliteus tendon intact. Intramuscular edema within the popliteus muscle.  Extensor Mechanism:  Intact quadriceps tendon and patellar tendon.  Bones: No bony erosion or cortical destruction. There are reactive subchondral marrow changes within the medial and patellofemoral compartments. Joint space narrowing and marginal osteophyte formation.  No fracture. No suspicious bone lesion.  Other: Extensive soft tissue edema  about the knee. Mild-to-moderate circumferential subcutaneous edema. No well-defined or drainable soft tissue fluid collection.  IMPRESSION: 1. Moderate to large joint effusion with extensive synovitis. An infectious or inflammatory arthropathy is the primary consideration and arthrocentesis is recommended. 2. Intramuscular edema is most pronounced within the popliteus muscle. Findings are nonspecific and could be due to muscle strain or myositis. No well-defined or drainable soft tissue fluid collection. 3. Mild to moderate patellofemoral and medial compartment osteoarthritis. 4. Intrasubstance degeneration of the medial and lateral menisci without discrete tear. 5. Intact cruciate and collateral ligaments.  These findings are in agreement with the initial preliminary report provided in PACS by Dr. Joelyn Oms.  These results will be called to the ordering clinician or representative by the Radiologist Assistant, and communication documented in the PACS or zVision Dashboard.   Electronically Signed   By: Davina Poke D.O.   On: 08/28/2019 09:15 I, Lynne Leader, personally (independently) visualized and performed the interpretation of the images attached in this note.  Labs from Rheumatology at [place] Result Report [place] Amplification - [facility]: 09/03/2019 9:36 AM [facility] Component Name Value Ref Range  Chlamydia trachomatis, NAA - [facility] Negative Negative   Neisseria gonorrhoeae by PCR - [facility] Negative Negative   Specimen Collected on  Genital - Urinary bladder structure (body structure) 09/01/2019 10:28 AM  Result Narrative  Performed at: [facility] [address] [place], [place] [id number] Lab Director: Rush Farmer MD, Phone: [phone]     Lab Results  Component Value Date  WBC 11.6 (H) 08/24/2019  HGB 13.3 (L) 08/24/2019  HCT 39.9 08/24/2019  MCV 88 08/24/2019  PLT 410 08/24/2019    Chemistry   Component Value Date/Time  NA 135 08/24/2019 1636  NA 140 01/26/2013 0630  K 3.7 08/24/2019 1636  K 4.3 01/26/2013 0630  CL 100 08/24/2019 1636  CL 108 01/26/2013 0630  CO2 27 08/24/2019 1636  BUN 7 08/24/2019 1636  CREATININE 0.7 08/24/2019 1636  CREATININE 0.9 01/26/2013 0630  GLUCOSE 105 08/24/2019 1636  GLUCOSE 106 01/26/2013 0630   Component Value Date/Time  CALCIUM 8.9 08/24/2019 1636  CALCIUM 9.0 01/26/2013 0630  ALKPHOS 89 08/24/2019 1636  ALKPHOS 47 01/26/2013 0630  AST 50 (H) 08/24/2019 1636  AST 21 01/26/2013 0630  ALT 81 (H) 08/24/2019 1636  ALT 33 01/26/2013 0630  TBILI 1.0 08/24/2019 1636  TBILI 0.9 01/26/2013 0630    Lab Results  Component Value Date  ALT 81 (H) 08/24/2019  AST 50 (H) 08/24/2019  ALKPHOS 89 08/24/2019     Sedimentation Rate-[place] - [facility] (09/01/2019 10:28 AM EST) Sedimentation Rate-[place] - [facility] (09/01/2019 10:28 AM EST)  Component Value Ref Range Performed At Pathologist Signature  Sed Rate - [facility] 22 0 - 30 mm/hr [facility]    Sedimentation Rate-[place] - [facility] (09/01/2019 10:28 AM EST)  Specimen  Blood   Sedimentation Rate-[place] - [facility] (09/01/2019 10:28 AM EST)  Narrative Performed At  Performed at: [address] [facility]  [address] [place], [place] [id number]  Lab Director: Rush Farmer MD, Phone: [phone]  Franchot Mimes    Sedimentation Rate-[place] - [facility] (09/01/2019 10:28 AM EST)  Performing Organization Address City/State/Zipcode Phone Number  [facility]       Back to top of Lab Results    Lyme, IgM, Early Test/Reflex - [facility] (09/01/2019 10:28 AM EST) Lyme, IgM, Early Test/Reflex - [facility] (09/01/2019 10:28 AM EST)  Component Value Ref Range Performed At Pathologist Signature  Lyme Disease Ab, Quant, IgM - [facility] <0.80 Comment:                 Negative     <0.80                Equivocal  0.80 - 1.19                Positive     >1.19 IgM levels may peak at 3-6 weeks post infection, then gradually decline. 0.00 - 0.79 index [facility]     Basic Metabolic Panel (BMP) (31/54/0086 10:28 AM EST) Basic Metabolic Panel (BMP) (76/19/5093 10:28 AM EST)  Component Value Ref Range Performed At Pathologist Signature  Glucose 106 70 - 110 mg/dL [facility]   Sodium 137 136 - 145 mmol/L [facility]   Potassium 4.2 3.6 - 5.1 mmol/L [facility]   Chloride 99 97 - 109 mmol/L [facility]   Carbon Dioxide (CO2) 30.6 22.0 - 32.0 mmol/L [facility]   Calcium 9.2 8.7 - 10.3 mg/dL [facility]   Urea Nitrogen (BUN) 10 7 - 25 mg/dL [facility]   Creatinine 0.6 (L) 0.7 - 1.3 mg/dL [facility]   Glomerular Filtration Rate (eGFR), MDRD Estimate 138 >60 mL/min/1.73sq m [facility]   BUN/Crea Ratio 16.7 6.0 - 20.0 [facility]   Anion Gap w/K 11.6 6.0 - 16.0 [facility]    Basic Metabolic Panel (BMP) (26/71/2458 10:28 AM EST)  Specimen  Blood   Basic Metabolic Panel (BMP) (09/98/3382 10:28 AM EST)  Performing Organization Address City/State/Zipcode Phone Number  [facility]  [place], [place] [address]     Back to top of Lab Results    Aldolase - [facility] (09/01/2019 10:28 AM EST) Aldolase - [facility] (09/01/2019 10:28 AM EST)  Component Value Ref Range Performed At Pathologist Signature  Aldolase - [facility] 5.4 3.3 - 10.3 U/L [facility]     Creatine Kinase (CK), Total (09/01/2019 10:28 AM EST) Creatine Kinase (CK), Total (09/01/2019 10:28 AM EST)  Component Value Ref Range Performed At Pathologist Signature  CK, Total (Creatine Kinase, Total) 21 (L) 49 - 397 U/L [facility]    Creatine Kinase (CK), Total (09/01/2019 10:28 AM EST)   Specimen  Blood   Creatine Kinase (CK), Total (09/01/2019 10:28 AM EST)  Performing Organization Address City/State/Zipcode Phone Number  [place]  [address], [place] [address]     Back to top of Lab Results    Thyroid Stimulating-Hormone (TSH) (09/01/2019 10:28 AM EST) Thyroid Stimulating-Hormone (TSH) (09/01/2019 10:28 AM EST)  Component Value Ref Range Performed At Pathologist Signature  Thyroid Stimulating Hormone (TSH) 1.514 0.450-5.330 uIU/ml uIU/mL [facility]    Thyroid Stimulating-Hormone (TSH) (09/01/2019 10:28 AM EST)  Specimen  Blood   Thyroid Stimulating-Hormone (TSH) (09/01/2019 10:28 AM EST)  Performing Organization Address City/State/Zipcode Phone Number  [place]  [place],  [phone]     Back to top of Lab Results    Hepatitis Panel (4) - [facility] (09/01/2019 10:28 AM EST) Hepatitis Panel (4) - [facility] (09/01/2019 10:28 AM EST)  Component Value Ref Range Performed  At Pathologist Signature  Hep A IgM - [facility] Negative Negative [facility]   Hepatitis B Surface Antigen - [facility] Negative Negative [facility]   Hepatitis B Core IgM Antibody - [facility] Negative Negative [facility]   Hep C Virus Ab - [facility] <0.1 Comment:                  Negative:   < 0.8               Indeterminate: 0.8 - 0.9                 Positive:   > 0.9 The CDC recommends that a positive HCV antibody result be followed up with a HCV Nucleic Acid Amplification test (654650). 0.0 - 0.9 s/co ratio [facility]     C-Reactive Protein, Quant - [facility] (09/01/2019 10:28 AM EST) C-Reactive Protein, Quant - [facility] (09/01/2019 10:28 AM EST)  Component Value Ref Range Performed At Pathologist Signature  C Reactive Protein - [facility] 25 (H) 0 - 10 mg/L [facility]     CBC w/auto Differential (5 Part)  (09/01/2019 10:28 AM EST) CBC w/auto Differential (5 Part) (09/01/2019 10:28 AM EST)  Component Value Ref Range Performed At Pathologist Signature  WBC (White Blood Cell Count) 9.8 4.1 - 10.2 103/uL [facility]   RBC (Red Blood Cell Count) 4.42 (L) 4.69 - 6.13 106/uL [facility]   Hemoglobin 13.0 (L) 14.1 - 18.1 gm/dL [facility]   Hematocrit 40.1 40.0 - 52.0 % [facility]   MCV (Mean Corpuscular Volume) 90.7 80.0 - 100.0 fl [facility]   MCH (Mean Corpuscular Hemoglobin) 29.4 27.0 - 31.2 pg [facility]   MCHC (Mean Corpuscular Hemoglobin Concentration) 32.4 32.0 - 36.0 gm/dL [facility]   Platelet Count 441 150 - 450 103/uL [place] [facility]   RDW-CV (Red Cell Distribution Width) 11.8 11.6 - 14.8 % [facility]   MPV (Mean Platelet Volume) 9.5 9.4 - 12.4 fl [facility]   Neutrophils 7.32 1.50 - 7.80 103/uL [facility]   Lymphocytes 1.08 1.00 - 3.60 103/uL [facility]   Monocytes 1.08 0.00 - 1.50 103/uL [facility]   Eosinophils 0.19 0.00 - 0.55 103/uL [facility]   Basophils 0.06 0.00 - 0.09 103/uL [facility]   Neutrophil % 75.1 (H) 32.0 - 70.0 % [facility]   Lymphocyte % 11.1 10.0 - 50.0 % [facility]   Monocyte % 11.1 4.0 - 13.0 % [facility]   Eosinophil % 1.9 1.0 - 5.0 % [facility]   Basophil% 0.6 0.0 - 2.0 % [facility]   Immature Granulocyte % 0.2 <=0.7 % [facility]   Immature Granulocyte Count 0.02 <=0.06 10^3/L [place] [facility]    CBC w/auto Differential (5 Part) (09/01/2019 10:28 AM EST)  Specimen  Blood   CBC w/auto Differential (5 Part) (09/01/2019 10:28 AM EST)  Performing Organization Address City/State/Zipcode Phone Number    [facility]  [place], [place] [address]     Back to top of Lab Results    Uric Acid (09/01/2019 10:28 AM  EST) Uric Acid (09/01/2019 10:28 AM EST)  Component Value Ref Range Performed At Pathologist Signature  Uric Acid 3.5 (L) 4.4 - 7.6 mg/dL [facility]    Uric Acid (09/01/2019 10:28 AM EST)  Specimen  Blood   Uric Acid (09/01/2019 10:28 AM EST)  Performing Organization Address City/State/Zipcode Phone Number  [place]  [place], [place] [address]     Back to top of Lab Results    Rheumatoid Arthritis Factor - [facility] (09/01/2019 10:28 AM EST) Rheumatoid Arthritis Factor - [facility] (09/01/2019 10:28 AM EST)  Component Value Ref Range Performed At Pathologist Signature  RA Latex Turbid. - [facility] <10.0 0.0 - 13.9 IU/mL [facility]    CCP Antibodies IgG/IgA - [facility] (09/01/2019 10:28 AM EST) CCP Antibodies IgG/IgA - [facility] (09/01/2019 10:28 AM EST)  Component Value Ref Range Performed At Pathologist Signature  CCP Antibodies IgG/IgA - [facility] 3 Comment:              Negative        <20             Weak positive   20 - 39             Moderate positive 40 - 59             Strong positive    >59 0 - 19 units        Impression and Recommendations:    Assessment and Plan: 59 y.o. male with  Diffuse myalgias with persistent left knee effusion bilateral shoulder pain lack of motion bilateral hip pain bilateral hand pain. Patient has had extensive work-up so far including multiple imaging tests, joint aspiration multiple labs also for being largely normal.  Clearly something is wrong however is not obvious at this point what it is. Plan to proceed with further work-up.  Will obtain nerve conduction study to assess for possible neuropathy as cause of persistent pain.  Additionally will obtain CT scan of the  abdomen and pelvis.  Is possible that he has dermatomyositis and some occult malignancy.  This is the most likely next place to look.  Additionally patient has a follow-up appointment scheduled with rheumatology soon.  He will benefit from further lab assessment including possible HLA-B27.  CC: PCP: Burnard Hawthorne, FNP  CC: Rheumatology: Marlowe Sax, [place] [address] [place]  [address]  [place], [place] [address]  [phone]  [phone] (Fax)   PDMP not reviewed this encounter. Orders Placed This Encounter  Procedures   DG Shoulder Right    Standing Status:   Future    Number of Occurrences:   1    Standing Expiration Date:   11/05/2020    Order Specific Question:   Reason for Exam (SYMPTOM  OR DIAGNOSIS REQUIRED)    Answer:   BL shoulder pain and lack ROM    Order Specific Question:   Preferred imaging location?    Answer:   [facility]    Order Specific Question:   Radiology Contrast Protocol - do NOT remove file path    Answer:   \[email or web address]   DG Shoulder Left    Standing Status:   Future    Number of Occurrences:   1    Standing Expiration Date:   11/05/2020    Order Specific Question:   Reason for Exam (SYMPTOM  OR DIAGNOSIS REQUIRED)    Answer:   eval BL shoulder pain and  lack ROM    Order Specific Question:   Preferred imaging location?    Answer:   [facility]    Order Specific Question:   Radiology Contrast Protocol - do NOT remove file path    Answer:   \[email or web address]   CT ABDOMEN PELVIS W CONTRAST    Standing Status:   Future    Standing Expiration Date:   12/05/2020    Order Specific Question:   ** REASON FOR EXAM (FREE TEXT)    Answer:   Diffuse myalgia/arthralgia. Concern malignanay and dermatomyositis    Order Specific Question:   If indicated for the ordered procedure, I authorize the administration of contrast media per  Radiology protocol    Answer:   Yes    Order Specific Question:   Preferred imaging location?    Answer:   [facility]    Order Specific Question:   Is Oral Contrast requested for this exam?    Answer:   Yes, Per Radiology protocol    Order Specific Question:   Radiology Contrast Protocol - do NOT remove file path    Answer:   \[email or web address]   Ambulatory referral to Neurology    Referral Priority:   Routine    Referral Type:   Consultation    Referral Reason:   Specialty Services Required    Requested Specialty:   Neurology    Number of Visits Requested:   1   NCV with EMG(electromyography)    Standing Status:   Future    Standing Expiration Date:   09/07/2020   No orders of the defined types were placed in this encounter.   Discussed warning signs or symptoms. Please see discharge instructions. Patient expresses understanding.   The above documentation has been reviewed and is accurate and complete Lynne Leader

## 2019-09-08 NOTE — Patient Instructions (Addendum)
Thank you for coming in today. Try stopping the amlodipine for a few weeks.  I think getting xray of the shoulders and possibly a CT scan of the belly is a good idea.  I will talk with your doctors and place and order for [place] if needed.  Keep me updated.  I expect you will get more labs with Rheumatology in the next week or two.   I will also arrange for a nerve study to be done either here in [place] or in [place].   Recheck with me in about 1 month but keep me updated.

## 2019-09-09 ENCOUNTER — Encounter: Payer: Self-pay | Admitting: Neurology

## 2019-09-09 ENCOUNTER — Encounter: Payer: Self-pay | Admitting: Family Medicine

## 2019-09-09 NOTE — Progress Notes (Signed)
X-ray left shoulder shows some arthritis and possible chronic rotator cuff tear.  This does not appear to be a new acute thing.  Based on your physical exam I think frozen shoulder is most likely.

## 2019-09-09 NOTE — Progress Notes (Signed)
X-ray left shoulder shows some arthritis.  No acute findings.  Based on your physical exam frozen shoulder is most likely.

## 2019-09-10 LAB — AEROBIC/ANAEROBIC CULTURE W GRAM STAIN (SURGICAL/DEEP WOUND): Culture: NO GROWTH

## 2019-09-11 ENCOUNTER — Telehealth: Payer: Self-pay | Admitting: Family

## 2019-09-11 NOTE — Telephone Encounter (Signed)
Call pt I had ordered labs 08/30/19 and had really wanted him to go to [place] and have these done. Is he able to do so this week?   When does he go back to rheumatology, Dr Meda Coffee?  Also, looks like he reached out to his [place] cardiologist, Dr [name]- does he have an appointment scheduled?  Dr Lynne Leader recommended that we have an echocardiogram done due to the swelling he is having and I think that is VERY appropriate.   Since he is following with cardiology, it would be best to have done through their office since his last echo was 2019.   Judson Roch, would you mind calling [phone] [facility] and telling Dr [name] nurse that we are concerned with LE swelling, and left knee swelling. I noticed last echo was 2019. Would they be agreeable to calling pt to sch follow up appt AND echocardiogram?   Patient is now following with rheumatology and sports medicine due to the swelling and pain that he is having.

## 2019-09-12 ENCOUNTER — Other Ambulatory Visit: Payer: Self-pay | Admitting: Family

## 2019-09-12 DIAGNOSIS — G8929 Other chronic pain: Secondary | ICD-10-CM

## 2019-09-12 DIAGNOSIS — I4891 Unspecified atrial fibrillation: Secondary | ICD-10-CM

## 2019-09-12 NOTE — Telephone Encounter (Signed)
Noted sarah; thank you for calling patient  Jake Liu and Jake Liu,   I have placed urgent cardiology referral to [facility] for this patient. He currently sees electrophysiology there and also needs a regular cardiologist. Please let me know any updates in regards to this.

## 2019-09-12 NOTE — Telephone Encounter (Signed)
Call pt again-  Looks like he had seen dr [name] in the past For some reason, I wasn't clear if for insurance reasons he went to [facility]?  Please confirm with patient in he can back to [facility] so that I can get him an appt there  Also, please ensure you review other telephone note below in its entirely with patient. There are several pieces here. Thank you!

## 2019-09-12 NOTE — Telephone Encounter (Signed)
When I called the nurse line to send a message back to Dr. Si Gaul nurse she informed me that they currently only see patient for afib & that Dr. [name] actually is an electrophysiologist. She said that patient really needs to establish with cardiology first. She said that she can send his OV note to Dr. [name] as a FYI so to speak? Do you want to place referral for cardiology?  Also I tried to call patient to advise on please completing labs that are needed & was unable to reach. VM box was full so I was unable to leave message.

## 2019-09-12 NOTE — Telephone Encounter (Signed)
Pt sch Dr Marisue [place] assistant 10/13/19

## 2019-09-12 NOTE — Telephone Encounter (Signed)
I spoke with patient & he really wants to be referred to cardiology within [facility]. Someone who works with Dr. [name] preferably. He wanted to keep his cardiologist or any specialist regarding heart in same network.   Patient will continue to see [name] & does have appointment. He does have an appointment with Dr. [name] in March.

## 2019-09-13 ENCOUNTER — Ambulatory Visit: Payer: Commercial Managed Care - PPO | Admitting: Family Medicine

## 2019-09-14 DIAGNOSIS — M7502 Adhesive capsulitis of left shoulder: Secondary | ICD-10-CM | POA: Insufficient documentation

## 2019-09-14 DIAGNOSIS — M7501 Adhesive capsulitis of right shoulder: Secondary | ICD-10-CM | POA: Insufficient documentation

## 2019-09-15 ENCOUNTER — Ambulatory Visit: Payer: Commercial Managed Care - PPO | Admitting: Family Medicine

## 2019-09-16 ENCOUNTER — Emergency Department
Admission: EM | Admit: 2019-09-16 | Discharge: 2019-09-16 | Disposition: A | Discharge status: Home / Self Care | Payer: Commercial Managed Care - PPO | Attending: Student in an Organized Health Care Education/Training Program | Admitting: Student in an Organized Health Care Education/Training Program

## 2019-09-16 ENCOUNTER — Other Ambulatory Visit: Payer: Self-pay

## 2019-09-16 ENCOUNTER — Telehealth: Payer: Self-pay | Admitting: Family

## 2019-09-16 ENCOUNTER — Encounter: Payer: Self-pay | Admitting: Emergency Medicine

## 2019-09-16 DIAGNOSIS — Z87891 Personal history of nicotine dependence: Secondary | ICD-10-CM | POA: Insufficient documentation

## 2019-09-16 DIAGNOSIS — M791 Myalgia, unspecified site: Secondary | ICD-10-CM

## 2019-09-16 DIAGNOSIS — I509 Heart failure, unspecified: Secondary | ICD-10-CM | POA: Diagnosis not present

## 2019-09-16 DIAGNOSIS — Z79899 Other long term (current) drug therapy: Secondary | ICD-10-CM | POA: Insufficient documentation

## 2019-09-16 DIAGNOSIS — I110 Hypertensive heart disease with heart failure: Secondary | ICD-10-CM | POA: Insufficient documentation

## 2019-09-16 LAB — CBC WITH DIFFERENTIAL/PLATELET
Abs Immature Granulocytes: 0.03 10*3/uL (ref 0.00–0.07)
Basophils Absolute: 0.1 10*3/uL (ref 0.0–0.1)
Basophils Relative: 1 %
Eosinophils Absolute: 0.1 10*3/uL (ref 0.0–0.5)
Eosinophils Relative: 1 %
HCT: 36 % — ABNORMAL LOW (ref 39.0–52.0)
Hemoglobin: 11.8 g/dL — ABNORMAL LOW (ref 13.0–17.0)
Immature Granulocytes: 0 %
Lymphocytes Relative: 13 %
Lymphs Abs: 1.3 10*3/uL (ref 0.7–4.0)
MCH: 28.3 pg (ref 26.0–34.0)
MCHC: 32.8 g/dL (ref 30.0–36.0)
MCV: 86.3 fL (ref 80.0–100.0)
Monocytes Absolute: 1 10*3/uL (ref 0.1–1.0)
Monocytes Relative: 10 %
Neutro Abs: 7.2 10*3/uL (ref 1.7–7.7)
Neutrophils Relative %: 75 %
Platelets: 402 10*3/uL — ABNORMAL HIGH (ref 150–400)
RBC: 4.17 MIL/uL — ABNORMAL LOW (ref 4.22–5.81)
RDW: 12.3 % (ref 11.5–15.5)
WBC: 9.7 10*3/uL (ref 4.0–10.5)
nRBC: 0 % (ref 0.0–0.2)

## 2019-09-16 LAB — COMPREHENSIVE METABOLIC PANEL
ALT: 48 U/L — ABNORMAL HIGH (ref 0–44)
AST: 31 U/L (ref 15–41)
Albumin: 2.7 g/dL — ABNORMAL LOW (ref 3.5–5.0)
Alkaline Phosphatase: 75 U/L (ref 38–126)
Anion gap: 13 (ref 5–15)
BUN: 11 mg/dL (ref 6–20)
CO2: 26 mmol/L (ref 22–32)
Calcium: 9.3 mg/dL (ref 8.9–10.3)
Chloride: 95 mmol/L — ABNORMAL LOW (ref 98–111)
Creatinine, Ser: 0.58 mg/dL — ABNORMAL LOW (ref 0.61–1.24)
GFR calc Af Amer: >60 mL/min (ref >60)
GFR calc non Af Amer: >60 mL/min (ref >60)
Glucose, Bld: 104 mg/dL — ABNORMAL HIGH (ref 70–99)
Potassium: 4 mmol/L (ref 3.5–5.1)
Sodium: 134 mmol/L — ABNORMAL LOW (ref 135–145)
Total Bilirubin: 0.7 mg/dL (ref 0.3–1.2)
Total Protein: 6.9 g/dL (ref 6.5–8.1)

## 2019-09-16 LAB — CK: Total CK: 8 U/L — ABNORMAL LOW (ref 49–397)

## 2019-09-16 LAB — C-REACTIVE PROTEIN: CRP: 5.3 mg/dL — ABNORMAL HIGH

## 2019-09-16 LAB — SEDIMENTATION RATE: Sed Rate: 67 mm/h — ABNORMAL HIGH (ref 0–20)

## 2019-09-16 MED ORDER — PREDNISONE 10 MG PO TABS: 10.0000 mg | ORAL_TABLET | Freq: Every day | ORAL | 0 refills | Status: DC

## 2019-09-16 MED ORDER — ONDANSETRON HCL 4 MG/2ML IJ SOLN
4.0000 mg | Freq: Once | INTRAMUSCULAR | Status: AC
  Administered 2019-09-16: 4 mg via INTRAVENOUS
  Filled 2019-09-16: qty 2

## 2019-09-16 MED ORDER — SODIUM CHLORIDE 0.9 % IV BOLUS
500.0000 mL | Freq: Once | INTRAVENOUS | Status: AC
  Administered 2019-09-16: 500 mL via INTRAVENOUS

## 2019-09-16 MED ORDER — HYDROCODONE-ACETAMINOPHEN 5-325 MG PO TABS: 1.0000 | ORAL_TABLET | ORAL | 0 refills | Status: DC | PRN

## 2019-09-16 MED ORDER — HYDROMORPHONE HCL 1 MG/ML IJ SOLN
0.5000 mg | INTRAMUSCULAR | Status: DC | PRN
  Administered 2019-09-16: 0.5 mg via INTRAVENOUS
  Filled 2019-09-16: qty 1

## 2019-09-16 MED ORDER — HALOPERIDOL LACTATE 5 MG/ML IJ SOLN
5.0000 mg | Freq: Once | INTRAMUSCULAR | Status: AC
  Administered 2019-09-16: 5 mg via INTRAVENOUS
  Filled 2019-09-16: qty 1

## 2019-09-16 MED ORDER — OXYCODONE-ACETAMINOPHEN 5-325 MG PO TABS
1.0000 | ORAL_TABLET | Freq: Once | ORAL | Status: AC
  Administered 2019-09-16: 1 via ORAL
  Filled 2019-09-16: qty 1

## 2019-09-16 NOTE — ED Notes (Signed)
E-signature not working at this time. Pt verbalized understanding of D/C instructions, prescriptions and follow up care with no further questions at this time. Pt in NAD and wheeled to lobby at time of D/C.  

## 2019-09-16 NOTE — TOC Progression Note (Signed)
Transition of Care [facility]) - Progression Note    Patient Details  Name: Jake Liu MRN: [id number] Date of Birth: May 10, 1961  Transition of Care [facility]) CM/SW Contact  Anselm Pancoast, RN Phone Number: 09/16/2019, 3:21 PM  Clinical Narrative:    RN CM sent information to [facility] for review-possibly able to take patient for nursing, patient and aide.    Expected Discharge Plan: [place]    Expected Discharge Plan and Services Expected Discharge Plan: [place]       Living arrangements for the past 2 months: Single Family Home                                       Social Determinants of Health (SDOH) Interventions    Readmission Risk Interventions No flowsheet data found.

## 2019-09-16 NOTE — TOC Initial Note (Signed)
Transition of Care [facility]) - Initial/Assessment Note    Patient Details  Name: Jake Liu MRN: [id number] Date of Birth: 1961/02/22  [facility]) CM/SW Contact:    Anselm Pancoast, RN Phone Number: 09/16/2019, 1:51 PM  Clinical Narrative:                 Patient states he has had increased pain and weakness since January with no diagnosis. Currently using crutches to ambulate and refuses to use wheelchair. Has 2 story home with master bedroom/bathroom on the main level. Adult daughter lives with patient and assists as needed however patient concerned about daughter injuring her back assisting him. Patient is in agreeance with home health care although worried about COVID and 31 year old granddaughter in the home. Patient is able to work 100% remote from home but concerned about job stability due to his increased pain.  Expected Discharge Plan: [place]     Patient Goals and CMS Choice Patient states their goals for this hospitalization and ongoing recovery are:: Get some help at home and assist daughter      Expected Discharge Plan and Services Expected Discharge Plan: [place]       Living arrangements for the past 2 months: Single Family Home                                      Prior Living Arrangements/Services Living arrangements for the past 2 months: Single Family Home Lives with:: Adult Children(Daughter is primary caregiver) Patient language and need for interpreter reviewed:: Yes Do you feel safe going back to the place where you live?: Yes      Need for Family Participation in Patient Care: Yes (Comment) Care giver support system in place?: Yes (comment) Current home services: DME(crutches, refuses wheelchair) Criminal Activity/Legal Involvement Pertinent to Current Situation/Hospitalization: No - Comment as needed  Activities of Daily Living      Permission Sought/Granted Permission sought to share  information with : Case Manager Permission granted to share information with : Yes, Verbal Permission Granted              Emotional Assessment Appearance:: Appears stated age Attitude/Demeanor/Rapport: Engaged Affect (typically observed): Accepting Orientation: : Oriented to Self, Oriented to Place, Oriented to  Time, Oriented to Situation Alcohol / Substance Use: Never Used Psych Involvement: No (comment)  Admission diagnosis:  joint pain Patient Active Problem List   Diagnosis Date Noted  . Myalgia 08/26/2019  . Left knee pain 08/26/2019  . Flank pain 08/20/2018  . Fatty liver disease, nonalcoholic [id number]  . Right lower quadrant abdominal pain 08/18/2018  . Erectile dysfunction 03/08/2018  . GAD (generalized anxiety disorder) 03/08/2018  . Bilateral low back pain without sciatica 03/08/2018  . Viral illness 03/24/2016  . Umbilical hernia 0000000  . Hyperlipidemia 07/31/2014  . Obstructive sleep apnea 01/10/2014  . GERD (gastroesophageal reflux disease) 12/07/2013  . Chest pain 01/25/2013  . Atrial fibrillation status post cardioversion ([place]) 01/12/2012  . Hypertension 01/12/2012  . Insomnia 01/12/2012   PCP:  Burnard Hawthorne, FNP Pharmacy:   [facility] [place], [place] [address] [place] [address] Phone: [phone] Fax: [phone]     Social Determinants of Health (SDOH) Interventions    Readmission Risk Interventions No flowsheet  data found.

## 2019-09-16 NOTE — TOC Transition Note (Signed)
Transition of Care [facility]) - CM/SW Discharge Note   Patient Details  Name: Jake Liu MRN: [id number] Date of Birth: Feb 07, 1961  Transition of Care [facility]) CM/SW Contact:  Anselm Pancoast, RN Phone Number: 09/16/2019, 3:53 PM   Clinical Narrative:     Patient discharging home with [facility] for PT and aide services. HHC will begin on 09/20/19.         Patient Goals and CMS Choice Patient states their goals for this hospitalization and ongoing recovery are:: Get some help at home and assist daughter      Discharge Placement                       Discharge Plan and Services                                     Social Determinants of Health (SDOH) Interventions     Readmission Risk Interventions No flowsheet data found.

## 2019-09-16 NOTE — Telephone Encounter (Signed)
Seen in [facility] ed

## 2019-09-16 NOTE — Telephone Encounter (Signed)
Spoke with patient he says he is tired of going to ER  He hurts all over and cannot move his joints especially his knuckles in his hands, advised if he cannot move he has to call EMS, patient agreed he will call EMS but wants to be transported to [place] to call EMS may have to go nearest hospital to advise stable but he can request transfer, once stable. Patient agreed.

## 2019-09-16 NOTE — ED Provider Notes (Addendum)
[facility] Emergency Department Provider Note    First MD Initiated Contact with Patient 09/16/19 1244     (approximate)  I have reviewed the triage vital signs and the nursing notes.   HISTORY  Chief Complaint Joint Pain    HPI Jake Liu is a 59 y.o. male the below listed past medical history presents the ER for recurrent generalized myalgias aching joints and difficulty caring for himself at home.  Patient has been extensively worked up for similar symptoms over the past several months having extensive evaluation by orthopedic as well as rheumatology clinics.  Has had recurrent arthrocentesis of the left knee all of which have been negative for septic arthritis.  They have been evaluated for Lyme which has been negative.  They discontinued his statin as well as blood pressure medication appropriate he was having some form of medication induced myositis and myalgias.  Returns to ER today feeling stiff and having persistent muscle aches and pains.   No interval trauma.    CT abd/pelvis 1/22 without lytic lesions.       Past Medical History:  Diagnosis Date  . Anginal pain ([place])   . Arthritis   . Atrial fibrillation ([place])   . Cardiomyopathy ([place])    EF: 45%  . CHF (congestive heart failure) ([place])   . GERD (gastroesophageal reflux disease)   . History of TIAs   . Hypertension   . Insomnia   . Sciatica   . Sleep apnea    Family History  Problem Relation Age of Onset  . Heart disease Mother 4  . Asthma Father    Past Surgical History:  Procedure Laterality Date  . ABLATION N/A    for af  . COLONOSCOPY    . COLONOSCOPY WITH PROPOFOL N/A 10/13/2018   Procedure: COLONOSCOPY WITH PROPOFOL;  Surgeon: Toledo, [name], MD;  Location: [facility] ENDOSCOPY;  Service: Gastroenterology;  Laterality: N/A;  . ESOPHAGOGASTRODUODENOSCOPY    . ESOPHAGOGASTRODUODENOSCOPY (EGD) WITH PROPOFOL N/A 10/13/2018   Procedure: ESOPHAGOGASTRODUODENOSCOPY (EGD) WITH  PROPOFOL;  Surgeon: Toledo, [name], MD;  Location: [facility] ENDOSCOPY;  Service: Gastroenterology;  Laterality: N/A;  . HERNIA REPAIR    . KNEE SURGERY    . ROTATOR CUFF REPAIR     Patient Active Problem List   Diagnosis Date Noted  . Myalgia 08/26/2019  . Left knee pain 08/26/2019  . Flank pain 08/20/2018  . Fatty liver disease, nonalcoholic 123XX123  . Right lower quadrant abdominal pain 08/18/2018  . Erectile dysfunction 03/08/2018  . GAD (generalized anxiety disorder) 03/08/2018  . Bilateral low back pain without sciatica 03/08/2018  . Viral illness 03/24/2016  . Umbilical hernia 0000000  . Hyperlipidemia 07/31/2014  . Obstructive sleep apnea 01/10/2014  . GERD (gastroesophageal reflux disease) 12/07/2013  . Chest pain 01/25/2013  . Atrial fibrillation status post cardioversion ([place]) 01/12/2012  . Hypertension 01/12/2012  . Insomnia 01/12/2012      Prior to Admission medications   Medication Sig Start Date End Date Taking? Authorizing Provider  amLODipine (NORVASC) 5 MG tablet TAKE 1 TABLET(5 MG) BY MOUTH DAILY 03/28/19   Burnard Hawthorne, FNP  HYDROcodone-acetaminophen (NORCO) 5-325 MG tablet Take 1 tablet by mouth every 4 (four) hours as needed for moderate pain. 09/16/19   Merlyn Lot, MD  lidocaine (LIDODERM) 5 % Place 1 patch onto the skin daily. Remove & Discard patch within 12 hours or as directed by MD 08/28/19   Paulette Blanch, MD  meloxicam (MOBIC) 15 MG  tablet Take 15 mg by mouth daily.    [provider]  metoprolol succinate (TOPROL-XL) 50 MG 24 hr tablet Take 1 tablet (50 mg total) by mouth daily. Take with or immediately following a meal. 01/14/13   Jackolyn Confer, MD  omeprazole (PRILOSEC) 40 MG capsule TAKE 1 CAPSULE(40 MG) BY MOUTH DAILY 07/06/19   Burnard Hawthorne, FNP  predniSONE (DELTASONE) 10 MG tablet Take 1 tablet (10 mg total) by mouth daily. Day 1-2: Take 50 mg  ( 5 pills) Day 3-4 : Take 40 mg (4pills) Day 5-6: Take 30 mg (3 pills)  Day 7-8:  Take 20 mg (2 pills) Day 9:  Take 10mg  (1 pill) 09/16/19   Merlyn Lot, MD  rivaroxaban (XARELTO) 20 MG TABS tablet Take by mouth. 04/01/13   [provider]    Allergies Sulfa antibiotics, Sulfasalazine, Bactrim [sulfamethoxazole-trimethoprim], and Lisinopril    Social History Social History   Tobacco Use  . Smoking status: Former Research scientist (life sciences)  . Smokeless tobacco: Never Used  Substance Use Topics  . Alcohol use: Yes    Alcohol/week: 14.0 standard drinks    Types: 7 Glasses of wine, 7 Standard drinks or equivalent per week    Comment: drinks at night., bottle and half of red wine;  Drinks 10 cups coffee daily  . Drug use: No    Review of Systems Patient denies headaches, rhinorrhea, blurry vision, numbness, shortness of breath, chest pain, edema, cough, abdominal pain, nausea, vomiting, diarrhea, dysuria, fevers, rashes or hallucinations unless otherwise stated above in HPI. ____________________________________________   PHYSICAL EXAM:  VITAL SIGNS: Vitals:   09/16/19 1250  BP: (!) 160/94  Pulse: 84  Resp: 17  Temp: 98 F (36.7 C)  SpO2: 97%    Constitutional: Alert and oriented.  Eyes: Conjunctivae are normal.  Head: Atraumatic. Nose: No congestion/rhinnorhea. Mouth/Throat: Mucous membranes are moist.   Neck: No stridor. Painless ROM.  Cardiovascular: Normal rate, regular rhythm. Grossly normal heart sounds.  Good peripheral circulation. Respiratory: Normal respiratory effort.  No retractions. Lungs CTAB. Gastrointestinal: Soft and nontender. No distention. No abdominal bruits. No CVA tenderness. Genitourinary:  Musculoskeletal: + left knee effusion,  No overlying warmth.  Trace pedal edema. N/V intact distally.  Neurologic:  Normal speech and language. No gross focal neurologic deficits are appreciated. No facial droop Skin:  Skin is warm, dry and intact. No rash noted. Psychiatric: Mood and affect are normal. Speech and behavior are  normal.  ____________________________________________   LABS (all labs ordered are listed, but only abnormal results are displayed)  Results for orders placed or performed during the hospital encounter of 09/16/19 (from the past 24 hour(s))  CBC with Differential/Platelet     Status: Abnormal   Collection Time: 09/16/19 12:48 PM  Result Value Ref Range   WBC 9.7 4.0 - 10.5 K/uL   RBC 4.17 (L) 4.22 - 5.81 MIL/uL   Hemoglobin 11.8 (L) 13.0 - 17.0 g/dL   HCT 36.0 (L) 39.0 - 52.0 %   MCV 86.3 80.0 - 100.0 fL   MCH 28.3 26.0 - 34.0 pg   MCHC 32.8 30.0 - 36.0 g/dL   RDW 12.3 11.5 - 15.5 %   Platelets 402 (H) 150 - 400 K/uL   nRBC 0.0 0.0 - 0.2 %   Neutrophils Relative % 75 %   Neutro Abs 7.2 1.7 - 7.7 K/uL   Lymphocytes Relative 13 %   Lymphs Abs 1.3 0.7 - 4.0 K/uL   Monocytes Relative 10 %  Monocytes Absolute 1.0 0.1 - 1.0 K/uL   Eosinophils Relative 1 %   Eosinophils Absolute 0.1 0.0 - 0.5 K/uL   Basophils Relative 1 %   Basophils Absolute 0.1 0.0 - 0.1 K/uL   Immature Granulocytes 0 %   Abs Immature Granulocytes 0.03 0.00 - 0.07 K/uL  Comprehensive metabolic panel     Status: Abnormal   Collection Time: 09/16/19 12:48 PM  Result Value Ref Range   Sodium 134 (L) 135 - 145 mmol/L   Potassium 4.0 3.5 - 5.1 mmol/L   Chloride 95 (L) 98 - 111 mmol/L   CO2 26 22 - 32 mmol/L   Glucose, Bld 104 (H) 70 - 99 mg/dL   BUN 11 6 - 20 mg/dL   Creatinine, Ser 0.58 (L) 0.61 - 1.24 mg/dL   Calcium 9.3 8.9 - 10.3 mg/dL   Total Protein 6.9 6.5 - 8.1 g/dL   Albumin 2.7 (L) 3.5 - 5.0 g/dL   AST 31 15 - 41 U/L   ALT 48 (H) 0 - 44 U/L   Alkaline Phosphatase 75 38 - 126 U/L   Total Bilirubin 0.7 0.3 - 1.2 mg/dL   GFR calc non Af Amer >60 >60 mL/min   GFR calc Af Amer >60 >60 mL/min   Anion gap 13 5 - 15  Sedimentation rate     Status: Abnormal   Collection Time: 09/16/19 12:48 PM  Result Value Ref Range   Sed Rate 67 (H) 0 - 20 mm/hr  CK     Status: Abnormal   Collection Time: 09/16/19  12:48 PM  Result Value Ref Range   Total CK 8 (L) 49 - 397 U/L   ____________________________________________ ____________________________________________  RADIOLOGY   ____________________________________________   PROCEDURES  Procedure(s) performed:  Procedures    Critical Care performed: no ____________________________________________   INITIAL IMPRESSION / ASSESSMENT AND PLAN / ED COURSE  Pertinent labs & imaging results that were available during my care of the patient were reviewed by me and considered in my medical decision making (see chart for details).   DDX: RA, OA, infectious arthritis, myositis, deconditioning  Colden Czerniak is a 59 y.o. who presents to the ED with symptoms as described above.  Patient nontoxic-appearing.  Has diffuse myalgias and aches that seem to have been ongoing for quite some time without a clear diagnosis.  Seems like he is having progression of similar symptoms.  He is moving all extremities.  Will give pain medicine.  No report of any trauma.  His abdominal exam is soft benign.  We will repeat blood work.  Patient is AFVSS in ED. Exam as above. Given current presentation have considered the above differential.  The patient will be placed on continuous pulse oximetry and telemetry for monitoring.  Laboratory evaluation will be sent to evaluate for the above complaints.     Clinical Course as of Sep 16 1547  Fri Sep 16, 2019  1510 Patient blood work seems to be at baseline if not slightly improved from previous.  He appears more comfortable.  Social work has evaluated patient and thoroughly discussed home health options.  States that the steroids did not help much.     [PR]  1539 Discussed recommendation for admission/observation for pain control and further evaluation if pain not improved.  Patient was able to stand to use the restroom.  Patient is declining admission or rehab placement. States he has follow-up with rheumatology as well as  orthopedics.  Home health has been ordered.  Will send prescription for pain medication to his pharmacy.  Have discussed with the patient and available family all diagnostics and treatments performed thus far and all questions were answered to the best of my ability. The patient demonstrates understanding and agreement with plan.    [PR]    Clinical Course User Index [PR] Merlyn Lot, MD    The patient was evaluated in Emergency Department today for the symptoms described in the history of present illness. He/she was evaluated in the context of the global COVID-19 pandemic, which necessitated consideration that the patient might be at risk for infection with the SARS-CoV-2 virus that causes COVID-19. Institutional protocols and algorithms that pertain to the evaluation of patients at risk for COVID-19 are in a state of rapid change based on information released by regulatory bodies including the CDC and federal and state organizations. These policies and algorithms were followed during the patient's care in the ED.  As part of my medical decision making, I reviewed the following data within the [place] notes reviewed and incorporated, Labs reviewed, notes from prior ED visits and [place] Controlled Substance Database   ____________________________________________   FINAL CLINICAL IMPRESSION(S) / ED DIAGNOSES  Final diagnoses:  Myalgia      NEW MEDICATIONS STARTED DURING THIS VISIT:  New Prescriptions   HYDROCODONE-ACETAMINOPHEN (NORCO) 5-325 MG TABLET    Take 1 tablet by mouth every 4 (four) hours as needed for moderate pain.   PREDNISONE (DELTASONE) 10 MG TABLET    Take 1 tablet (10 mg total) by mouth daily. Day 1-2: Take 50 mg  ( 5 pills) Day 3-4 : Take 40 mg (4pills) Day 5-6: Take 30 mg (3 pills) Day 7-8:  Take 20 mg (2 pills) Day 9:  Take 10mg  (1 pill)     Note:  This document was prepared using Dragon voice recognition software and may include  unintentional dictation errors.    Merlyn Lot, MD 09/16/19 1546    Merlyn Lot, MD 09/16/19 1550

## 2019-09-16 NOTE — Telephone Encounter (Signed)
[place] RECORD AccessNurse Patient Name: Jake Liu Gender: Male DOB: Aug 17, 1960 Age: 59 Y 19 M 11 D Return Phone Number: [phone] (Primary) Address: City/State/Zip: [place] [address] Client [facility] [place] Night - Cl Client Site [place] Physician [name]- NP Contact Type Call Who Is Calling Patient / Member / Family / Caregiver Call Type Triage / Clinical Relationship To Patient Self Return Phone Number [phone] (Primary) Chief Complaint Muscle pain Reason for Call Symptomatic / Request for [name] states he is experiencing muscle pain. Caller states he would like to discuss his symptoms. Translation No Nurse Assessment Nurse: Jimmye Norman, RN, [place] Date/Time (Eastern Time): 09/16/2019 7:00:52 AM Confirm and document reason for call. If symptomatic, describe symptoms. ---Caller states he is experiencing muscle pain. Has had his knee drained a few times, most recent was around Christmas time. Is having weakness and pain in his muscles and joints all over his body. Has the patient had close contact with a person known or suspected to have the novel coronavirus illness OR traveled / lives in area with major community spread (including international travel) in the last 14 days from the onset of symptoms? * If Asymptomatic, screen for exposure and travel within the last 14 days. ---No Does the patient have any new or worsening symptoms? ---Yes Will a triage be completed? ---Yes Related visit to physician within the last 2 weeks? ---Yes Does the PT have any chronic conditions? (i.e. diabetes, asthma, this includes High risk factors for pregnancy, etc.) ---Yes List chronic conditions. ---arthritis Is this a behavioral health or substance abuse call? ---No Guidelines Guideline Title Affirmed Question  Affirmed Notes Nurse Date/Time (Eastern Time) Muscle Aches and Body Pain Shock suspected (e.g., cold/pale/clammy skin, too weak to stand, low BP, rapid pulse) Jimmye Norman, RN, Loree Fee 09/16/2019 7:04:09 AM PLEASE NOTE: All timestamps contained within this report are represented as [place] Standard Time. CONFIDENTIALTY NOTICE: This fax transmission is intended only for the addressee. It contains information that is legally privileged, confidential or otherwise protected from use or disclosure. If you are not the intended recipient, you are strictly prohibited from reviewing, disclosing, copying using or disseminating any of this information or taking any action in reliance on or regarding this information. If you have received this fax in error, please notify us immediately by telephone so that we can arrange for its return to [place]. Phone: [phone], Toll-Free: [phone], Fax: [phone] Page: 2 of 2 Call Id: [id number] [place]. Time Eilene Ghazi Time) Disposition Final User 09/16/2019 7:07:27 AM 911 Outcome Documentation Jimmye Norman, RN, Loree Fee Reason: Caller refused to call 911, follow up call unnecessary 09/16/2019 7:06:49 AM Call EMS 911 Now Yes Jimmye Norman, RN, Loree Fee Caller Disagree/Comply Disagree Caller Understands Yes PreDisposition InappropriateToAsk Care Advice Given Per Guideline CALL EMS 911 NOW: * Immediate medical attention is needed. You need to hang up and call 911 (or an ambulance). Referrals GO TO FACILITY REFUSED

## 2019-09-16 NOTE — Discharge Instructions (Signed)
Please follow up with rheumatology and orthopedics.  Return if unable to control pain at home or for any additional questions or concerns.

## 2019-09-16 NOTE — ED Triage Notes (Signed)
Pt presents from home via [facility] with c/o joint pain and swelling that has increasingly gotten worse since Christmas. Pt has noted new swelling to bilateral knuckles today. Pt with limited movement of bilateral arms due to stiffness. Pt has hx of baker cyst in left knee. Pt recently taken off narcotics by primary care doctor. Pt primary care doctor referred pt here due to pt being unable to care for self.

## 2019-09-19 ENCOUNTER — Ambulatory Visit: Payer: Commercial Managed Care - PPO | Admitting: Family Medicine

## 2019-09-19 ENCOUNTER — Other Ambulatory Visit: Payer: Commercial Managed Care - PPO

## 2019-09-19 ENCOUNTER — Other Ambulatory Visit: Payer: Self-pay

## 2019-09-19 ENCOUNTER — Encounter: Payer: Self-pay | Admitting: Family Medicine

## 2019-09-19 ENCOUNTER — Ambulatory Visit: Payer: Self-pay

## 2019-09-19 VITALS — BP 140/80 | HR 86 | Ht 72.0 in | Wt 213.6 lb

## 2019-09-19 DIAGNOSIS — M7501 Adhesive capsulitis of right shoulder: Secondary | ICD-10-CM | POA: Diagnosis not present

## 2019-09-19 DIAGNOSIS — M25511 Pain in right shoulder: Secondary | ICD-10-CM | POA: Diagnosis not present

## 2019-09-19 DIAGNOSIS — M7502 Adhesive capsulitis of left shoulder: Secondary | ICD-10-CM

## 2019-09-19 DIAGNOSIS — M791 Myalgia, unspecified site: Secondary | ICD-10-CM | POA: Diagnosis not present

## 2019-09-19 DIAGNOSIS — M25512 Pain in left shoulder: Secondary | ICD-10-CM

## 2019-09-19 DIAGNOSIS — M25562 Pain in left knee: Secondary | ICD-10-CM

## 2019-09-19 NOTE — Progress Notes (Signed)
I, Wendy Poet, LAT, ATC, am serving as scribe for Dr. Lynne Leader.  Jake Liu is a 59 y.o. male who presents to [place] at [facility] today for f/u of B shoulder and L knee pain.  He was last seen by Dr. Georgina Snell on 09/08/19 for L knee pain and swelling since December 2020 and B shoulder pain and other myalgias.  He had B shoulder XRs and was referred to neuro for NCV and EMG of his B UEs.  Most recently he was at the [facility] ED on 09/16/19 c/o myalgias.  He was prescribed hydrocodone-acetaminophen and prednisone at the ED.  He is here today to discuss possible B shoulder injections.  Since his last visit, pt reports that his B shoulders are about the same but does note slightly improved B shoulder ROM.  However, he still cannot actively elevate his B shoulders past 45-50 deg.  He notes improved L knee pain which he feels is due to the prednisone.  He is taking the prednisone as prescribed at the ED but has not picked up the hydrocodone-acetaminophen.  He has had follow-up with rheumatology as well who agrees with work-up so far.  Rheumatology planned to order SPEP/UPEP and immunofixation M spike but lab was not done.  Patient is willing to get the lab today if needed.  Pertinent review of systems: No fevers or chills.  Significant improvement with prednisone.  Relevant historical information: Hypertension   Exam:  BP 140/80 (BP Location: Left Arm, Patient Position: Sitting, Cuff Size: Large)   Pulse 86   Ht 6' (1.829 m)   Wt 213 lb 9.6 oz (96.9 kg)   SpO2 95%   BMI 28.97 kg/m  General: Well Developed, well nourished, and in no acute distress.   MSK:  Shoulders bilaterally normal-appearing slight decreased muscle bulk. Significant lack of range of motion abduction and external rotation. Nontender. Intact strength within range of motion limits.  Left knee: Moderate effusion.  No erythema.    Lab and Radiology Results Results for orders placed or  performed during the hospital encounter of 09/16/19 (from the past 72 hour(s))  CBC with Differential/Platelet     Status: Abnormal   Collection Time: 09/16/19 12:48 PM  Result Value Ref Range   WBC 9.7 4.0 - 10.5 K/uL   RBC 4.17 (L) 4.22 - 5.81 MIL/uL   Hemoglobin 11.8 (L) 13.0 - 17.0 g/dL   HCT 36.0 (L) 39.0 - 52.0 %   MCV 86.3 80.0 - 100.0 fL   MCH 28.3 26.0 - 34.0 pg   MCHC 32.8 30.0 - 36.0 g/dL   RDW 12.3 11.5 - 15.5 %   Platelets 402 (H) 150 - 400 K/uL   nRBC 0.0 0.0 - 0.2 %   Neutrophils Relative % 75 %   Neutro Abs 7.2 1.7 - 7.7 K/uL   Lymphocytes Relative 13 %   Lymphs Abs 1.3 0.7 - 4.0 K/uL   Monocytes Relative 10 %   Monocytes Absolute 1.0 0.1 - 1.0 K/uL   Eosinophils Relative 1 %   Eosinophils Absolute 0.1 0.0 - 0.5 K/uL   Basophils Relative 1 %   Basophils Absolute 0.1 0.0 - 0.1 K/uL   Immature Granulocytes 0 %   Abs Immature Granulocytes 0.03 0.00 - 0.07 K/uL    Comment: Performed at [facility], [address], [place], [place] [address]  Comprehensive metabolic panel     Status: Abnormal   Collection Time: 09/16/19 12:48 PM  Result  Value Ref Range   Sodium 134 (L) 135 - 145 mmol/L   Potassium 4.0 3.5 - 5.1 mmol/L   Chloride 95 (L) 98 - 111 mmol/L   CO2 26 22 - 32 mmol/L   Glucose, Bld 104 (H) 70 - 99 mg/dL   BUN 11 6 - 20 mg/dL   Creatinine, Ser 0.58 (L) 0.61 - 1.24 mg/dL   Calcium 9.3 8.9 - 10.3 mg/dL   Total Protein 6.9 6.5 - 8.1 g/dL   Albumin 2.7 (L) 3.5 - 5.0 g/dL   AST 31 15 - 41 U/L   ALT 48 (H) 0 - 44 U/L   Alkaline Phosphatase 75 38 - 126 U/L   Total Bilirubin 0.7 0.3 - 1.2 mg/dL   GFR calc non Af Amer >60 >60 mL/min   GFR calc Af Amer >60 >60 mL/min   Anion gap 13 5 - 15    Comment: Performed at [facility], [address] [place], [place] [address]  Sedimentation rate     Status: Abnormal   Collection Time: 09/16/19 12:48 PM  Result Value Ref Range   Sed Rate 67 (H) 0 - 20 mm/hr    Comment: Performed at [facility], [address] [place], [place] [address]  CK     Status: Abnormal   Collection Time: 09/16/19 12:48 PM  Result Value Ref Range   Total CK 8 (L) 49 - 397 U/L    Comment: Performed at [facility], [address] [place], [place] [address]  C-reactive protein     Status: Abnormal   Collection Time: 09/16/19 12:48 PM  Result Value Ref Range   CRP 5.3 (H) <1.0 mg/dL    Comment: Performed at [facility], [address], [place], [place] [address]   No results found.    Procedure: Real-time Ultrasound Guided Injection of right glenohumeral joint Device: Philips Affiniti 50G Images permanently stored and available for review in the ultrasound unit. Verbal informed consent obtained.  Discussed risks and benefits of procedure. Warned about infection bleeding damage to structures skin hypopigmentation and fat atrophy among others. Patient expresses understanding and agreement Time-out conducted.   Noted no overlying erythema, induration, or other signs of local infection.   Skin prepped in a sterile fashion.   Local anesthesia: Topical Ethyl chloride.   With sterile technique and under real time ultrasound guidance:  40 mg of Kenalog and 3 mL of Marcaine injected easily.   Completed without difficulty   Pain immediately resolved suggesting accurate placement of the medication.   Advised to call if fevers/chills, erythema, induration, drainage, or persistent bleeding.   Images permanently stored and available for review in the ultrasound unit.  Impression: Technically successful ultrasound guided injection.  Procedure: Real-time Ultrasound Guided Injection of left glenohumeral joint Device: Philips Affiniti 50G Images permanently stored and available for review in the ultrasound unit. Verbal informed consent obtained.  Discussed risks and benefits of procedure. Warned about infection bleeding damage to structures skin hypopigmentation and fat atrophy among  others. Patient expresses understanding and agreement Time-out conducted.   Noted no overlying erythema, induration, or other signs of local infection.   Skin prepped in a sterile fashion.   Local anesthesia: Topical Ethyl chloride.   With sterile technique and under real time ultrasound guidance:  40 mg of Kenalog and 3 mL of Marcaine injected easily.   Completed without difficulty   Pain immediately resolved suggesting accurate placement of the medication.   Advised  to call if fevers/chills, erythema, induration, drainage, or persistent bleeding.   Images permanently stored and available for review in the ultrasound unit.  Impression: Technically successful ultrasound guided injection.     Assessment and Plan: 59 y.o. male with bilateral adhesive capsulitis shoulders. Patient is already had some improvement with oral prednisone.  Plan for steroid injection as above with referral to physical therapy.  Patient however has a larger issue of generalized myalgias and arthralgias with no clear explanation.  He does have some generally in the elevated inflammatory markers as noted above with recent ED visit.  He had significant symptom improvement with course of prednisone indicating that he probably has some systemic inflammatory issue.  His rheumatologist attempted to obtain immunofixation/SPEP UPEP which the patient never did get done.  I went ahead and ordered the exact same labs that the rheumatologist wanted to get done today.  Also will add on HLA-B27.  Check back with me as scheduled on March 15.  We will send copy of this note to PCP as well as to rheumatology.  CC: PCP: Burnard Hawthorne, FNP  CC: Rheumatology: Marlowe Sax, [address]  [address]  [place],  [address]  [phone]  [phone] (Fax)    Orders Placed This Encounter  Procedures  . [place] LIMITED JOINT SPACE STRUCTURES UP BILAT(NO LINKED CHARGES)    Order Specific  Question:   Reason for Exam (SYMPTOM  OR DIAGNOSIS REQUIRED)    Answer:   B shoulder pain    Order Specific Question:   Preferred imaging location?    Answer:   [facility]  . IFE, PE and FLC, Serum    Standing Status:   Future    Number of Occurrences:   1    Standing Expiration Date:   09/18/2020  . IFE AND PE, RANDOM URINE    Standing Status:   Future    Number of Occurrences:   1    Standing Expiration Date:   09/18/2020  . HLA-B27 antigen    Standing Status:   Future    Number of Occurrences:   1    Standing Expiration Date:   09/18/2020  . Ambulatory referral to Physical Therapy    Referral Priority:   Routine    Referral Type:   Physical Medicine    Referral Reason:   Specialty Services Required    Requested Specialty:   Physical Therapy    Number of Visits Requested:   1   No orders of the defined types were placed in this encounter.    Discussed warning signs or symptoms. Please see discharge instructions. Patient expresses understanding.   The above documentation has been reviewed and is accurate and complete Lynne Leader

## 2019-09-19 NOTE — Patient Instructions (Addendum)
You had B shoulder injections today.  Call or go to the ER if you develop a large red swollen joint with extreme pain or oozing puss.   Attend PT.  Get labs today.  Recheck on March 15th as scheduled.  I will update you and the other doctors with labs and xray results.

## 2019-09-21 ENCOUNTER — Ambulatory Visit (INDEPENDENT_AMBULATORY_CARE_PROVIDER_SITE_OTHER)
Admission: RE | Admit: 2019-09-21 | Discharge: 2019-09-21 | Disposition: A | Discharge status: Home / Self Care | Payer: Commercial Managed Care - PPO | Source: Ambulatory Visit | Attending: Family Medicine | Admitting: Family Medicine

## 2019-09-21 ENCOUNTER — Other Ambulatory Visit: Payer: Self-pay

## 2019-09-21 ENCOUNTER — Telehealth: Payer: Self-pay

## 2019-09-21 DIAGNOSIS — K529 Noninfective gastroenteritis and colitis, unspecified: Secondary | ICD-10-CM

## 2019-09-21 DIAGNOSIS — K760 Fatty (change of) liver, not elsewhere classified: Secondary | ICD-10-CM

## 2019-09-21 DIAGNOSIS — M25511 Pain in right shoulder: Secondary | ICD-10-CM

## 2019-09-21 DIAGNOSIS — G8929 Other chronic pain: Secondary | ICD-10-CM

## 2019-09-21 DIAGNOSIS — M25512 Pain in left shoulder: Secondary | ICD-10-CM

## 2019-09-21 DIAGNOSIS — M791 Myalgia, unspecified site: Secondary | ICD-10-CM

## 2019-09-21 LAB — HLA-B27 ANTIGEN: HLA-B27 Antigen: NEGATIVE

## 2019-09-21 IMAGING — CT CT ABD-PELV W/ CM
2 of 5 series · 15 of 46 positions shown, 17 images · IV contrast (omnipaque)
Comparison: [DATE]

CLINICAL DATA: Adenopathy. Myalgia and arthralgia. Evaluate for
malignancy and dermatomyositis.

EXAM:
CT ABDOMEN AND PELVIS WITH CONTRAST
TECHNIQUE: Multidetector CT imaging of the abdomen and pelvis was performed
using the standard protocol following bolus administration of
intravenous contrast.
CONTRAST:  100mL OMNIPAQUE IOHEXOL 300 MG/ML  SOLN

[Series 2: abd/pel w · axial · 0.81mm/px · z∈[-480,-95]mm · 12 of 87 slices shown, 14 images]
[im 5/87  soft-tissue]
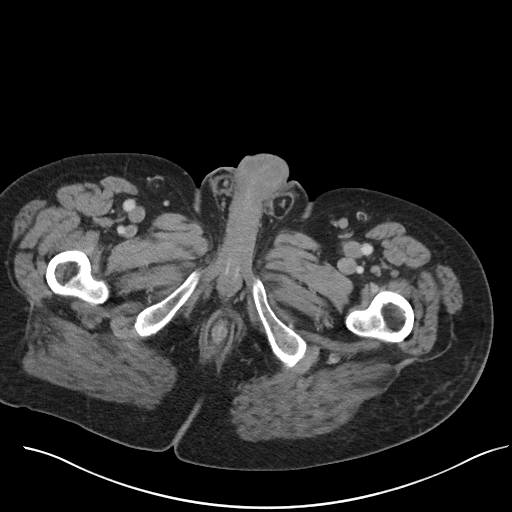
[im 5/87  bone]
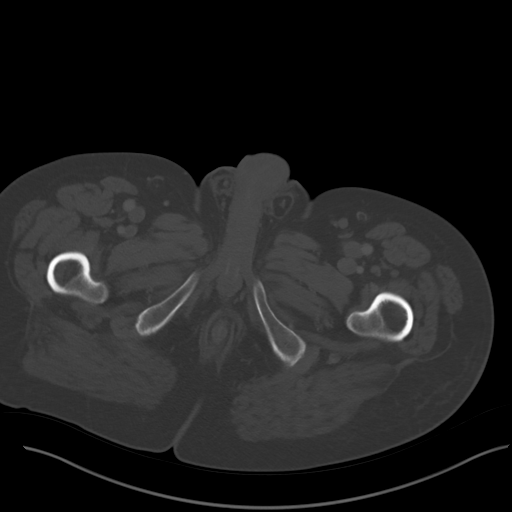
[im 14/87  soft-tissue]
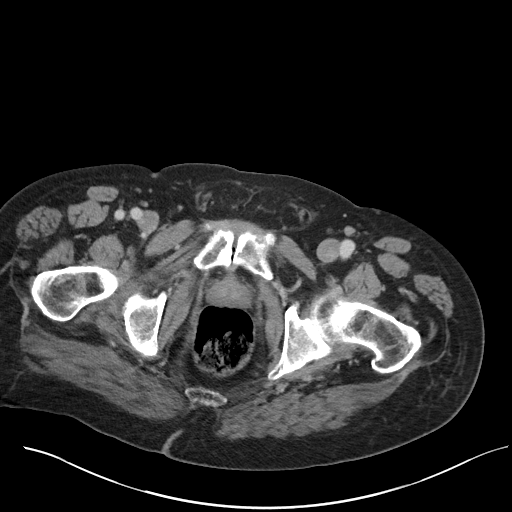
[im 19/87  soft-tissue]
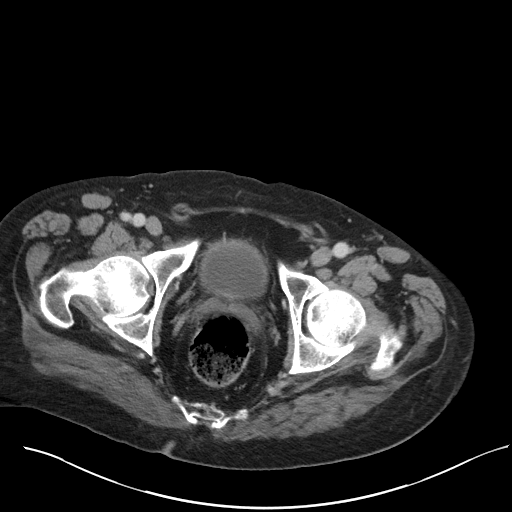
[im 28/87  soft-tissue]
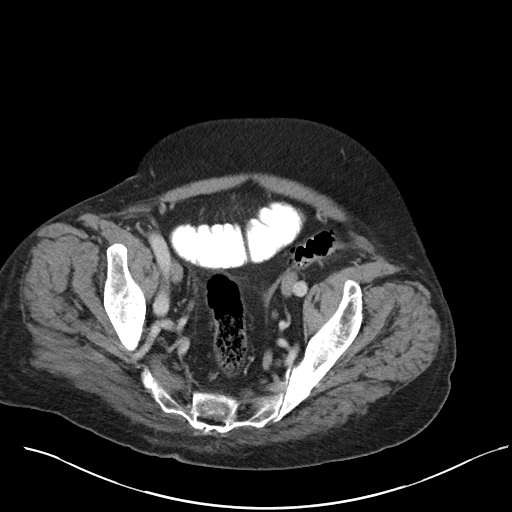
[im 32/87  soft-tissue]
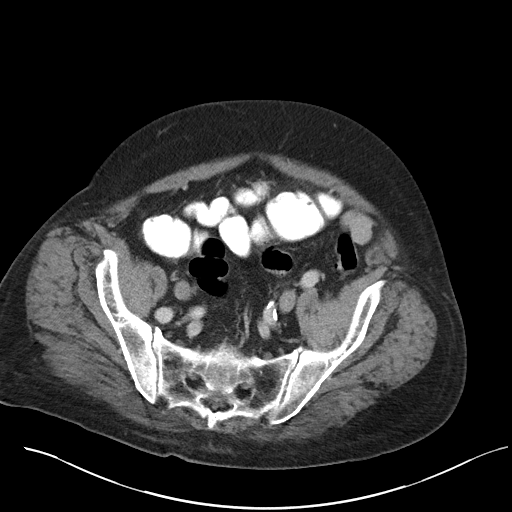
[im 41/87  soft-tissue]
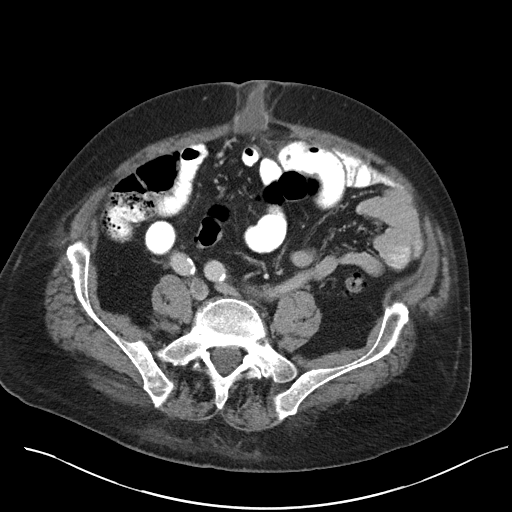
[im 46/87  soft-tissue]
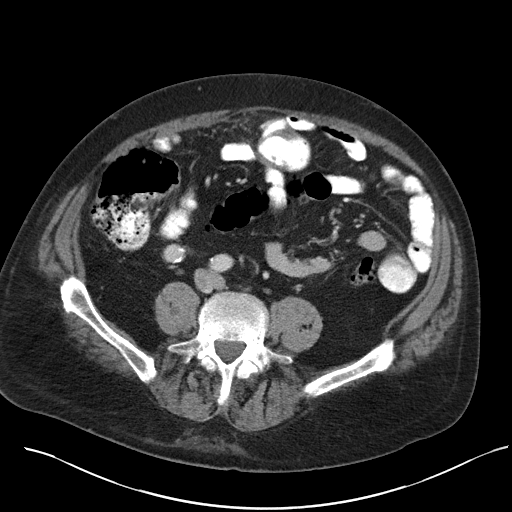
[im 55/87  soft-tissue]
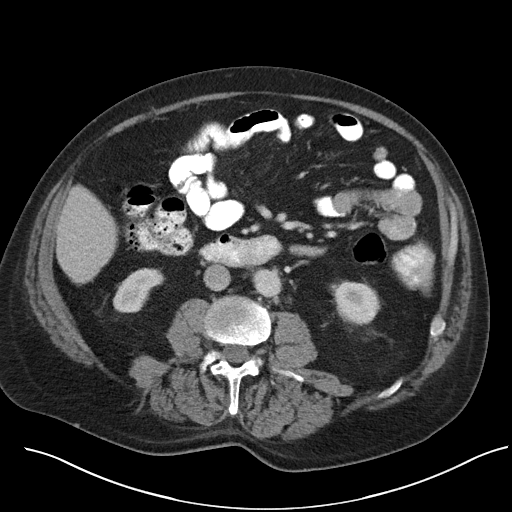
[im 59/87  soft-tissue]
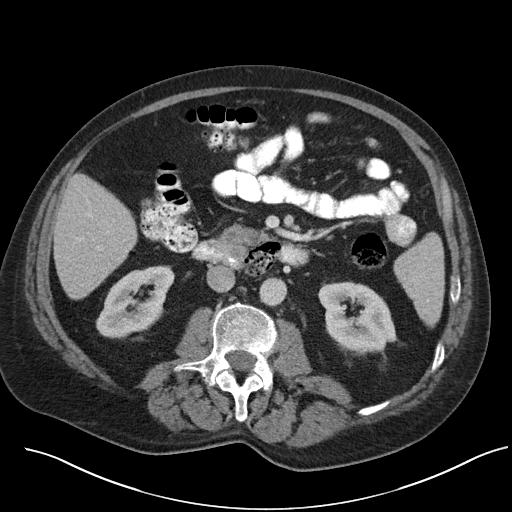
[im 59/87  bone]
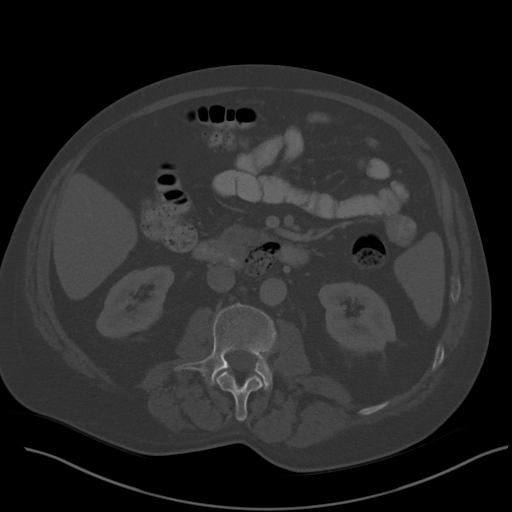
[im 68/87  soft-tissue]
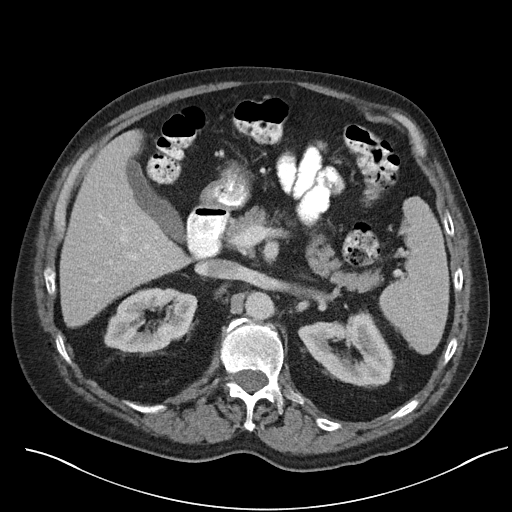
[im 73/87  soft-tissue]
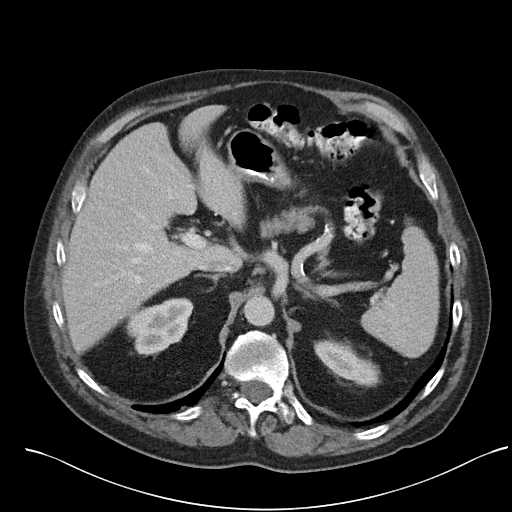
[im 82/87  soft-tissue]
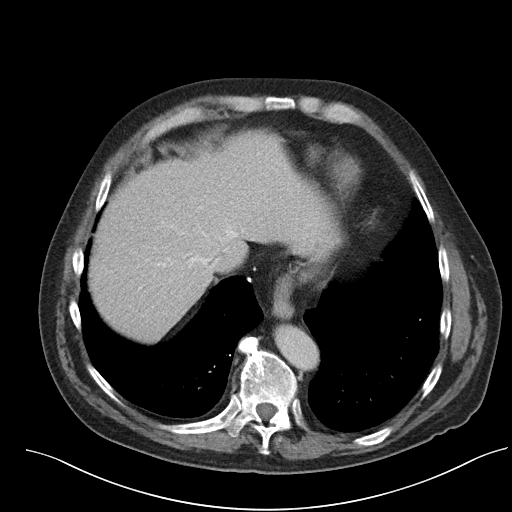

[Series 5: abd/pel w st · coronal · 0.73mm/px · 3 of 101 slices shown]
[im 34/101  soft-tissue]
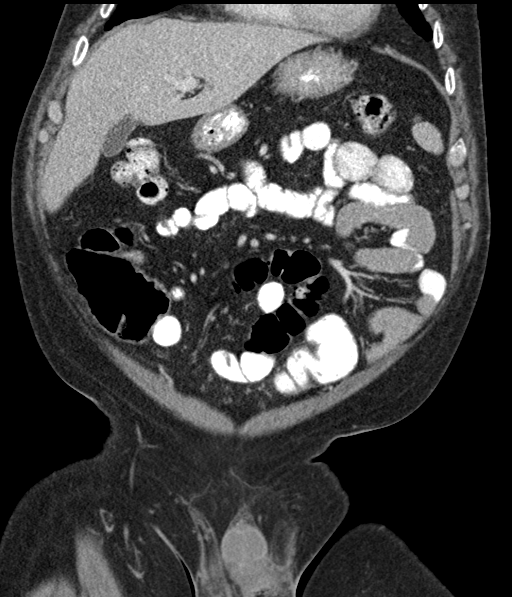
[im 45/101  soft-tissue]
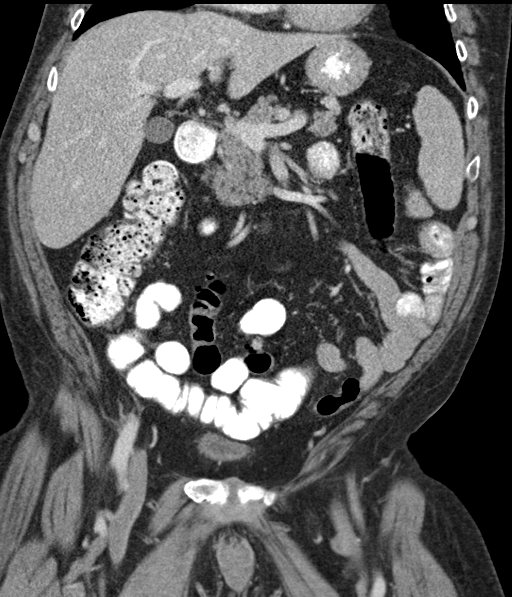
[im 56/101  soft-tissue]
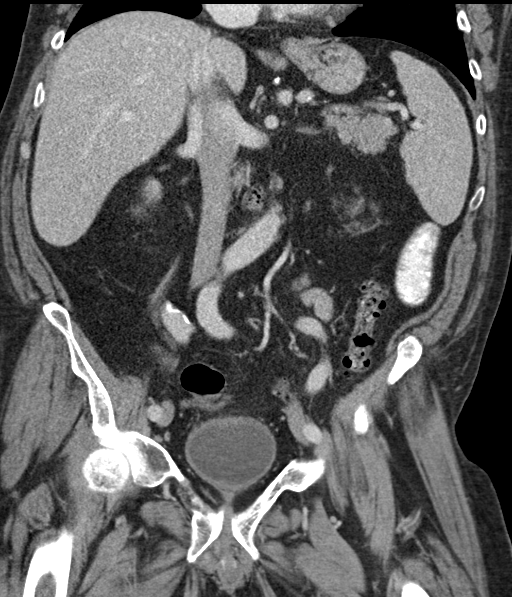

[15 of 46 positions shown; findings below may reference images not displayed]

FINDINGS: Lower chest: No acute abnormality.

Hepatobiliary: No suspicious liver abnormality identified.
Gallbladder unremarkable. No biliary dilatation.

Pancreas: No focal liver abnormality is seen. No gallstones,
gallbladder wall thickening, or biliary dilatation.

Spleen: Normal in size without focal abnormality.

Adrenals/Urinary Tract: Normal appearance of the adrenal glands.
Mild bilateral renal cortical lobulation identified. No mass or
hydronephrosis. Urinary bladder appears unremarkable.

Stomach/Bowel: Stomach appears normal. Enteric contrast material is
identified throughout the small bowel loops and into the colon up to
the sigmoid colon. Within the central abdomen there is a abnormal
appearing loop of mid ileum with asymmetric wall thickening. Here,
several converging loops small bowel appear tethered and there is
increase caliber of the EMAIKWU loop which measures
up to 3.6 cm. Mild surrounding fat stranding is identified and there
is a focal area of fluid extending into the umbilical hernia
measuring 2.3 x 3.9 cm.

Vascular/Lymphatic: Aortic atherosclerosis. No aneurysm. No
abdominopelvic adenopathy identified.

Reproductive: Prostate is unremarkable.

Other: Fat and fluid containing umbilical hernia noted.

Musculoskeletal: SI joints appear unremarkable. Peripherally
sclerotic lesion within the posterior right iliac bone is unchanged
and likely represents a benign abnormality. No acute or suspicious
bone lesions. Unchanged Schmorl's node and superior endplate
deformity involving the L2 vertebra.
IMPRESSION: 1. Focal loop of mid ileum with asymmetric wall thickening and
surrounding fluid and mild soft tissue stranding concerning for
enteritis. Several adjacent small bowel loops appear to converge
with the thickened loop of bowel which may reflect adhesions or
postinflammatory scarring. Findings may be inflammatory or
infectious in etiology. Cannot exclude inflammatory bowel disease.
2. Suspect partial small bowel obstruction with transition point in
the central abdomen, at the level of the thickened bowel loop.
3. No suspicious mass or adenopathy identified within the abdomen or
pelvis.
4.  Aortic Atherosclerosis ([LR]-[LR]).
5. These results will be called to the ordering clinician or
representative by the Radiologist Assistant, and communication
documented in the PACS or zVision Dashboard.

## 2019-09-21 MED ORDER — IOHEXOL 300 MG/ML  SOLN
100.0000 mL | Freq: Once | INTRAMUSCULAR | Status: AC | PRN
  Administered 2019-09-21: 100 mL via INTRAVENOUS

## 2019-09-21 NOTE — Telephone Encounter (Signed)
Patient had MRI done today wanted to know if he could get a call with the results today if possible

## 2019-09-21 NOTE — Progress Notes (Signed)
HLA-B27 test for psoriatic arthritis is negative. Other tests are still pending.

## 2019-09-22 ENCOUNTER — Telehealth: Payer: Self-pay | Admitting: Family

## 2019-09-22 LAB — IFE, PE AND FLC, SERUM
Albumin SerPl Elph-Mcnc: 3.1 g/dL (ref 2.9–4.4)
Albumin/Glob SerPl: 0.8 (ref 0.7–1.7)
Alpha 1: 0.5 g/dL — ABNORMAL HIGH (ref 0.0–0.4)
Alpha2 Glob SerPl Elph-Mcnc: 1 g/dL (ref 0.4–1.0)
B-Globulin SerPl Elph-Mcnc: 1.5 g/dL — ABNORMAL HIGH (ref 0.7–1.3)
Gamma Glob SerPl Elph-Mcnc: 0.9 g/dL (ref 0.4–1.8)
Globulin, Total: 3.9 g/dL (ref 2.2–3.9)
Ig Kappa Free Light Chain: 21.3 mg/L — ABNORMAL HIGH (ref 3.3–19.4)
Ig Lambda Free Light Chain: 15.1 mg/L (ref 5.7–26.3)
IgA/Immunoglobulin A, Serum: 305 mg/dL (ref 90–386)
IgG (Immunoglobin G), Serum: 1201 mg/dL (ref 603–1613)
IgM (Immunoglobulin M), Srm: 93 mg/dL (ref 20–172)
KAPPA/LAMBDA RATIO: 1.41 (ref 0.26–1.65)
Total Protein: 7 g/dL (ref 6.0–8.5)

## 2019-09-22 LAB — IFE AND PE, RANDOM URINE
% BETA, Urine: 28.6 %
ALBUMIN, U: 34.2 %
ALPHA 1 URINE: 2 %
ALPHA-2-GLOBULIN, U: 22.7 %
GAMMA GLOBULIN URINE: 12.4 %
Protein, Ur: 8 mg/dL

## 2019-09-22 NOTE — Telephone Encounter (Signed)
Dr. Georgina Snell spoke to the pt

## 2019-09-22 NOTE — Telephone Encounter (Signed)
Pt needs [name] to call him regarding the CT results. He said it is important that he gets a call back. I let him know that she was not here today. He said the results are showing in his myChart account and he really needs Joycelyn Schmid to look at them.

## 2019-09-22 NOTE — Telephone Encounter (Signed)
Patient has been called by Dr. Amalia Hailey office but wanted to make you aware of the results. He wanted you to know that they did find something this time & was really wanting some guidance. Patient really wanted you to call him to discuss this as soon as possible. I let him know that you were seeing patient's all day tomorrow & that I would see if you could possibly fit him in or if you want me to try to schedule VV?

## 2019-09-22 NOTE — Telephone Encounter (Signed)
Patient called back following up.  Please advise.

## 2019-09-22 NOTE — Progress Notes (Signed)
CT scan of abdomen shows inflammation and irritation of several loops of the small bowel.  Could be due to underlying inflammatory cause such as inflammatory bowel disease like Crohn's disease or ulcerative colitis.  Additionally partial small bowel obstruction seen.  Fortunately no mass or tumors. Based on this I have referred you to gastroenterology in [place].  You should hear from their office soon.  I think neck step is probably going to be colonoscopy and upper endoscopy.  If you have something like Crohn's disease you could possibly have underlying inflammatory situation causing some of your musculoskeletal pain as well.

## 2019-09-22 NOTE — Telephone Encounter (Signed)
Based on CT scan results we will proceed with referral to gastroenterology.  Patient lives in [place].  Placed referral to gastroenterology at that location.

## 2019-09-23 ENCOUNTER — Encounter: Payer: Self-pay | Admitting: Family Medicine

## 2019-09-23 NOTE — Telephone Encounter (Signed)
I added patient to schedule Monday at 9:30 for phone call. He just wanted some peace of mind that there was nothing life threatening seen. He said that his rheumatologist thought that it looked like his small intestines were filled with something & that really worried patient.

## 2019-09-23 NOTE — Telephone Encounter (Signed)
Call pt  Unfortunately I have a webex training throughout lunch today  I can work him in Monday at 55   I very much agree with Dr Clovis Riley advise to go to GI

## 2019-09-23 NOTE — Progress Notes (Signed)
IFE serum and urine test fortunately came back pretty normal indicating " no evidence of monoclonal proteins".  This eliminates or significantly reduces the chances of a rare cancer called multiple myeloma.  However I will also send the results of this test to your rheumatologist.  We can discuss this further upon your return on the 15th.

## 2019-09-26 NOTE — Telephone Encounter (Signed)
Pt is wanting to come into the office for his appt.. he passed the covid screening  Is it ok for pt to come in

## 2019-09-26 NOTE — Telephone Encounter (Signed)
I know you worked patient in your 9:30 are you okay him coming in office?

## 2019-09-27 DIAGNOSIS — M1380 Other specified arthritis, unspecified site: Secondary | ICD-10-CM

## 2019-09-27 HISTORY — DX: Other specified arthritis, unspecified site: M13.80

## 2019-09-28 ENCOUNTER — Telehealth: Payer: Self-pay | Admitting: Family

## 2019-09-28 ENCOUNTER — Encounter: Payer: Self-pay | Admitting: Family

## 2019-09-28 ENCOUNTER — Other Ambulatory Visit: Payer: Self-pay | Admitting: Internal Medicine

## 2019-09-28 ENCOUNTER — Ambulatory Visit: Payer: Commercial Managed Care - PPO | Admitting: Neurology

## 2019-09-28 ENCOUNTER — Ambulatory Visit: Payer: Commercial Managed Care - PPO | Admitting: Family

## 2019-09-28 ENCOUNTER — Other Ambulatory Visit: Payer: Self-pay

## 2019-09-28 VITALS — BP 138/78 | HR 97 | Temp 97.7°F | Ht 72.0 in | Wt 207.8 lb

## 2019-09-28 DIAGNOSIS — K56600 Partial intestinal obstruction, unspecified as to cause: Secondary | ICD-10-CM

## 2019-09-28 DIAGNOSIS — E782 Mixed hyperlipidemia: Secondary | ICD-10-CM | POA: Diagnosis not present

## 2019-09-28 DIAGNOSIS — R1030 Lower abdominal pain, unspecified: Secondary | ICD-10-CM

## 2019-09-28 DIAGNOSIS — M791 Myalgia, unspecified site: Secondary | ICD-10-CM

## 2019-09-28 DIAGNOSIS — R1033 Periumbilical pain: Secondary | ICD-10-CM | POA: Diagnosis not present

## 2019-09-28 DIAGNOSIS — R634 Abnormal weight loss: Secondary | ICD-10-CM

## 2019-09-28 NOTE — Telephone Encounter (Signed)
That was the absolute earliest appointment that GI had. Do I need to let patient know this?

## 2019-09-28 NOTE — Telephone Encounter (Signed)
Call dr Michele Mcalpine office ( GI ) and just ask if perhaps they have earlier appt. [phone]  Patient is particularly concerned with abnormal CT abdomen as as a courtesy I wanted to ask

## 2019-09-28 NOTE — Patient Instructions (Signed)
Please continue work-up in regards to MRI and certainly upcoming with GI. We will do what we can to move your GI appointment up sooner, and keep you posted.    We will also confirm your cardiology appointment.  I ou not hear from [place] in regards to your cardiology appointment, please give [place] a call.   Let me know how you are doing.

## 2019-09-28 NOTE — Progress Notes (Signed)
Subjective:    Patient ID: Jake Liu, male    DOB: [birth year], 59 y.o.   MRN: [id number]  CC: Jake Liu is a 59 y.o. male who presents today for follow up.   HPI: Feels well today. Today has been a good day.  Reports overall joint pain improved today since started on prednisone with Dr Meda Coffee yesterday. Yesterday he felt his 'lock up' while driving , and on prednisone , symptoms abated.   Shoulder and left knee pain drastically improved with PT with Nicole Kindred.   HLD- holding lipitor at this time.   ? [facility]  Here today to discuss CT Abdomen. He notes lower, pelvic discomfort.  No fever, diarrhea. No alcohol use.  Weight loss of 30 lbs in 3 months. Notes decrease in appetite due to pain.  Regular bowel habits, bowel movements daily.    Youngest daughter living with him.   Dr Meda Coffee- saw her yesterday. Unable to see note.    Pending EMG with Dr Posey Pronto Dr Juanda Crumble 3/19 and MRI abdomen and pelvis ordered by Dr Meda Coffee 10/06/19  CT abdomen and pelvis showed thickened loops of bowel. May reflect  inflammatory bowel disease.  Suspect partial small bowel obstruction.  CRP 5.3, sed rate 67  No trouble urinating or hesistancy.    HISTORY:  Past Medical History:  Diagnosis Date  . Anginal pain ([place])   . Arthritis   . Atrial fibrillation ([place])   . Cardiomyopathy ([place])    EF: 45%  . CHF (congestive heart failure) ([place])   . GERD (gastroesophageal reflux disease)   . History of TIAs   . Hypertension   . Insomnia   . Sciatica   . Sleep apnea    Past Surgical History:  Procedure Laterality Date  . ABLATION N/A    for af  . COLONOSCOPY    . COLONOSCOPY WITH PROPOFOL N/A 10/13/2018   Procedure: COLONOSCOPY WITH PROPOFOL;  Surgeon: Toledo, [name], MD;  Location: [facility] ENDOSCOPY;  Service: Gastroenterology;  Laterality: N/A;  . ESOPHAGOGASTRODUODENOSCOPY    . ESOPHAGOGASTRODUODENOSCOPY (EGD) WITH PROPOFOL N/A 10/13/2018   Procedure: ESOPHAGOGASTRODUODENOSCOPY (EGD)  WITH PROPOFOL;  Surgeon: Toledo, [name], MD;  Location: [facility] ENDOSCOPY;  Service: Gastroenterology;  Laterality: N/A;  . HERNIA REPAIR    . KNEE SURGERY    . ROTATOR CUFF REPAIR     Family History  Problem Relation Age of Onset  . Heart disease Mother 96  . Asthma Father     Allergies: Sulfa antibiotics, Sulfasalazine, Bactrim [sulfamethoxazole-trimethoprim], and Lisinopril Current Outpatient Medications on File Prior to Visit  Medication Sig Dispense Refill  . amLODipine (NORVASC) 5 MG tablet TAKE 1 TABLET(5 MG) BY MOUTH DAILY 90 tablet 4  . lidocaine (LIDODERM) 5 % Place 1 patch onto the skin daily. Remove & Discard patch within 12 hours or as directed by MD (Patient not taking: Reported on 10/03/2019) 30 patch 0  . metoprolol succinate (TOPROL-XL) 50 MG 24 hr tablet Take 1 tablet (50 mg total) by mouth daily. Take with or immediately following a meal. 30 tablet 3  . omeprazole (PRILOSEC) 40 MG capsule TAKE 1 CAPSULE(40 MG) BY MOUTH DAILY (Patient not taking: Reported on 10/03/2019) 30 capsule 3  . predniSONE (DELTASONE) 20 MG tablet Take 2 tablets by mouth daily. Take two tablets daily for 30 days.    . rivaroxaban (XARELTO) 20 MG TABS tablet Take by mouth.     No current facility-administered medications on file prior to visit.    Social  History   Tobacco Use  . Smoking status: Former Research scientist (life sciences)  . Smokeless tobacco: Never Used  Substance Use Topics  . Alcohol use: Not Currently    Alcohol/week: 14.0 standard drinks    Types: 7 Glasses of wine, 7 Standard drinks or equivalent per week    Comment: drinks at night., bottle and half of red wine;  Drinks 10 cups coffee daily  . Drug use: No    Review of Systems  Constitutional: Negative for chills and fever.  HENT: Negative for congestion, ear pain, rhinorrhea, sinus pressure and sore throat.   Respiratory: Negative for cough, shortness of breath and wheezing.   Cardiovascular: Negative for chest pain and palpitations.    Gastrointestinal: Positive for abdominal pain. Negative for diarrhea, nausea and vomiting.  Genitourinary: Negative for difficulty urinating and dysuria.  Musculoskeletal: Positive for arthralgias and joint swelling. Negative for myalgias.  Skin: Negative for rash.  Neurological: Negative for headaches.  Hematological: Negative for adenopathy.      Objective:    BP 138/78   Pulse 97   Temp 97.7 F (36.5 C) (Temporal)   Ht 6' (1.829 m)   Wt 207 lb 12.8 oz (94.3 kg)   SpO2 97%   BMI 28.18 kg/m  BP Readings from Last 3 Encounters:  09/28/19 138/78  09/19/19 140/80  09/16/19 (!) 150/90   Wt Readings from Last 3 Encounters:  09/28/19 207 lb 12.8 oz (94.3 kg)  09/19/19 213 lb 9.6 oz (96.9 kg)  09/16/19 210 lb (95.3 kg)    Physical Exam Vitals reviewed.  Constitutional:      Appearance: He is well-developed.  Cardiovascular:     Rate and Rhythm: Regular rhythm.     Heart sounds: Normal heart sounds.  Pulmonary:     Effort: Pulmonary effort is normal. No respiratory distress.     Breath sounds: Normal breath sounds. No wheezing, rhonchi or rales.  Abdominal:     General: Abdomen is flat. There is no distension.     Palpations: Abdomen is soft.     Tenderness: There is abdominal tenderness in the periumbilical area.     Comments: Mild diffuse tenderness noted around periumbilical area.   Musculoskeletal:     Right knee: Normal. No swelling. Normal range of motion.     Left knee: Normal. No swelling. Normal range of motion.  Skin:    General: Skin is warm and dry.  Neurological:     Mental Status: He is alert.  Psychiatric:        Speech: Speech normal.        Behavior: Behavior normal.        Assessment & Plan:   Problem List Items Addressed This Visit      Other   Abdominal pain - Primary    New . Slight tenderness on exam today however nonfocal, felt more diffuse.  CT abdomen pelvis reviewed with patient, appointment with Dr. Juanda Crumble 10/04/2019.  PSA  normal. Will follow GI notes.       Relevant Orders   PSA (Completed)   Hyperlipidemia    Myalgia, liver enzymes improved.  We will continue holding Lipitor for now.      Myalgia    Improved today however patient is on prednisone.  I am pleased to hear shoulder pain has improved, notably with injections from Dr. Georgina Snell, physical therapy.  Following closely with rheumatology appears  Autoimmune diagnosis still on differential although seronegative.  [name] has ordered MRA abdomen, and I certainly  follow these results.  Advised patient that based abnormalities on CT scan, he certainly should see gastroenterology.  Patient was in agreement.           I have discontinued Haaris Kwasnik's meloxicam and HYDROcodone-acetaminophen. I am also having him maintain his metoprolol succinate, rivaroxaban, amLODipine, omeprazole, lidocaine, and predniSONE.   No orders of the defined types were placed in this encounter.   Return precautions given.   Risks, benefits, and alternatives of the medications and treatment plan prescribed today were discussed, and patient expressed understanding.   Education regarding symptom management and diagnosis given to patient on AVS.  Continue to follow with Burnard Hawthorne, FNP for routine health maintenance.   [name] and I agreed with plan.   [name], FNP

## 2019-09-28 NOTE — Telephone Encounter (Signed)
Yes, please let him know.

## 2019-09-29 LAB — PSA: PSA: 0.24 ng/mL (ref 0.10–4.00)

## 2019-09-29 NOTE — Telephone Encounter (Signed)
I called & let patient know that unfortunately we could not get him seen any sooner at GI. Patient ws okay with & verbalized understanding.

## 2019-09-30 ENCOUNTER — Other Ambulatory Visit: Payer: Self-pay

## 2019-10-03 ENCOUNTER — Encounter: Payer: Self-pay | Admitting: Gastroenterology

## 2019-10-03 NOTE — Assessment & Plan Note (Signed)
New . Slight tenderness on exam today however nonfocal, felt more diffuse.  CT abdomen pelvis reviewed with patient, appointment with Dr. Juanda Crumble 10/04/2019.  PSA normal. Will follow GI notes.

## 2019-10-03 NOTE — Assessment & Plan Note (Signed)
Improved today however patient is on prednisone.  I am pleased to hear shoulder pain has improved, notably with injections from Dr. Georgina Snell, physical therapy.  Following closely with rheumatology appears  Autoimmune diagnosis still on differential although seronegative.  [name] has ordered MRA abdomen, and I certainly follow these results.  Advised patient that based abnormalities on CT scan, he certainly should see gastroenterology.  Patient was in agreement.

## 2019-10-03 NOTE — Assessment & Plan Note (Signed)
Myalgia, liver enzymes improved.  We will continue holding Lipitor for now.

## 2019-10-04 ENCOUNTER — Other Ambulatory Visit: Payer: Self-pay

## 2019-10-04 ENCOUNTER — Ambulatory Visit (INDEPENDENT_AMBULATORY_CARE_PROVIDER_SITE_OTHER): Payer: Commercial Managed Care - PPO | Admitting: Gastroenterology

## 2019-10-04 ENCOUNTER — Encounter: Payer: Self-pay | Admitting: Gastroenterology

## 2019-10-04 DIAGNOSIS — R109 Unspecified abdominal pain: Secondary | ICD-10-CM | POA: Diagnosis not present

## 2019-10-04 DIAGNOSIS — R933 Abnormal findings on diagnostic imaging of other parts of digestive tract: Secondary | ICD-10-CM

## 2019-10-04 DIAGNOSIS — R935 Abnormal findings on diagnostic imaging of other abdominal regions, including retroperitoneum: Secondary | ICD-10-CM

## 2019-10-04 NOTE — Progress Notes (Signed)
[place] [address]  [place]  [place], [place] [address]  Main: [phone]  Fax: [phone]   Gastroenterology Consultation  Referring Provider:     Gregor Hams, MD Primary Care Physician:  Burnard Hawthorne, FNP Reason for Consultation:    Abnormal CT        HPI:   Virtual Visit via Video Note  I connected with patient on 10/04/19 at 10:00 AM EST by video ([email or web address]) and verified that I am speaking with the correct person using two identifiers.   I discussed the limitations, risks, security and privacy concerns of performing an evaluation and management service by video and the availability of in person appointments. I also discussed with the patient that there may be a patient responsible charge related to this service. The patient expressed understanding and agreed to proceed.  Location of the patient: Home Location of provider: Home Participating persons: Patient and provider only (Nursing staff checked in patient via phone but were not physically involved in the video interaction - see their notes)   History of Present Illness: Chief Complaint  Patient presents with  . Abdominal Pain    Patient states this has been going on for 1 year. THis is located lower abdominal pain  . Diarrhea    Patient states this comes and goes     [name] is a 66 y.o. y/o male referred for consultation & management  by Dr. Vidal Schwalbe, Yvetta Coder, FNP.  Patient has had new joint issues starting December 2020 for which he is seeing rheumatology.  Due to ongoing arthralgias, synovitis, myalgias involving multiple joints, rheumatology ordered extensive work-up and also a CT abdomen was obtained due to patient reporting intermittent abdominal pain which led to the GI referral.  CT abdomen showed mid ileum thickening.  Patient reports 1 year history of periumbilical abdominal pain with meals only.  Also reports having a hernia there chronically.  No pain without meals.   Reports normal 1-2 formed bowel movements daily.  Denies chronic diarrhea or blood in stool. Does state he has 2 loose BMs sometimes because he drinks coffee in the AM  Was evaluated by [facility] GI for this last year and underwent EGD and colonoscopy which were unrevealing for etiology of pain.  Colonoscopy showed diverticulosis and internal hemorrhoids.  Repeat recommended in 5 years due to family history of colon cancer.  EGD reported gastritis and irregular Z-line.  Pathology negative for H. pylori.  Past Medical History:  Diagnosis Date  . Anginal pain ([place])   . Arthritis   . Atrial fibrillation ([place])   . Cardiomyopathy ([place])    EF: 45%  . CHF (congestive heart failure) ([place])   . GERD (gastroesophageal reflux disease)   . History of TIAs   . Hypertension   . Insomnia   . Sciatica   . Sleep apnea     Past Surgical History:  Procedure Laterality Date  . ABLATION N/A    for af  . COLONOSCOPY    . COLONOSCOPY WITH PROPOFOL N/A 10/13/2018   Procedure: COLONOSCOPY WITH PROPOFOL;  Surgeon: Toledo, [name], MD;  Location: [facility] ENDOSCOPY;  Service: Gastroenterology;  Laterality: N/A;  . ESOPHAGOGASTRODUODENOSCOPY    . ESOPHAGOGASTRODUODENOSCOPY (EGD) WITH PROPOFOL N/A 10/13/2018   Procedure: ESOPHAGOGASTRODUODENOSCOPY (EGD) WITH PROPOFOL;  Surgeon: Toledo, [name], MD;  Location: [facility] ENDOSCOPY;  Service: Gastroenterology;  Laterality: N/A;  . HERNIA REPAIR    . KNEE SURGERY    . ROTATOR CUFF  REPAIR      Prior to Admission medications   Medication Sig Start Date End Date Taking? Authorizing Provider  amLODipine (NORVASC) 5 MG tablet TAKE 1 TABLET(5 MG) BY MOUTH DAILY 03/28/19  Yes Arnett, Yvetta Coder, FNP  metoprolol succinate (TOPROL-XL) 50 MG 24 hr tablet Take 1 tablet (50 mg total) by mouth daily. Take with or immediately following a meal. 01/14/13  Yes Jackolyn Confer, MD  predniSONE (DELTASONE) 20 MG tablet Take 2 tablets by mouth daily. Take two tablets daily for 30  days. 09/27/19  Yes [provider]  rivaroxaban (XARELTO) 20 MG TABS tablet Take by mouth. 04/01/13  Yes [provider]  lidocaine (LIDODERM) 5 % Place 1 patch onto the skin daily. Remove & Discard patch within 12 hours or as directed by MD Patient not taking: Reported on 10/03/2019 08/28/19   Paulette Blanch, MD  omeprazole (PRILOSEC) 40 MG capsule TAKE 1 CAPSULE(40 MG) BY MOUTH DAILY Patient not taking: Reported on 10/03/2019 07/06/19   Burnard Hawthorne, FNP    Family History  Problem Relation Age of Onset  . Heart disease Mother 4  . Asthma Father      Social History   Tobacco Use  . Smoking status: Former Research scientist (life sciences)  . Smokeless tobacco: Never Used  Substance Use Topics  . Alcohol use: Not Currently    Alcohol/week: 14.0 standard drinks    Types: 7 Glasses of wine, 7 Standard drinks or equivalent per week    Comment: drinks at night., bottle and half of red wine;  Drinks 10 cups coffee daily  . Drug use: No    Allergies as of 10/04/2019 - Review Complete 10/04/2019  Allergen Reaction Noted  . Sulfa antibiotics Hives 03/04/2012  . Sulfasalazine Hives 07/06/2015  . Bactrim [sulfamethoxazole-trimethoprim]  03/21/2011  . Lisinopril  01/12/2012    Review of Systems:    All systems reviewed and negative except where noted in HPI.   Observations/Objective:  Labs: CBC    Component Value Date/Time   WBC 9.7 09/16/2019 1248   RBC 4.17 (L) 09/16/2019 1248   HGB 11.8 (L) 09/16/2019 1248   HCT 36.0 (L) 09/16/2019 1248   PLT 402 (H) 09/16/2019 1248   MCV 86.3 09/16/2019 1248   MCH 28.3 09/16/2019 1248   MCHC 32.8 09/16/2019 1248   RDW 12.3 09/16/2019 1248   LYMPHSABS 1.3 09/16/2019 1248   MONOABS 1.0 09/16/2019 1248   EOSABS 0.1 09/16/2019 1248   BASOSABS 0.1 09/16/2019 1248   CMP     Component Value Date/Time   NA 134 (L) 09/16/2019 1248   K 4.0 09/16/2019 1248   CL 95 (L) 09/16/2019 1248   CO2 26 09/16/2019 1248   GLUCOSE 104 (H) 09/16/2019 1248   BUN  11 09/16/2019 1248   CREATININE 0.58 (L) 09/16/2019 1248   CREATININE 0.74 08/13/2018 1501   CALCIUM 9.3 09/16/2019 1248   PROT 7.0 09/19/2019 1048   ALBUMIN 2.7 (L) 09/16/2019 1248   AST 31 09/16/2019 1248   ALT 48 (H) 09/16/2019 1248   ALKPHOS 75 09/16/2019 1248   BILITOT 0.7 09/16/2019 1248   GFRNONAA >60 09/16/2019 1248   GFRAA >60 09/16/2019 1248    Imaging Studies: DG Shoulder Right  Result Date: 09/08/2019 CLINICAL DATA:  Bilateral shoulder pain with decreased range of motion EXAM: RIGHT SHOULDER - 2+ VIEW COMPARISON:  Radiograph March 04, 2014 FINDINGS: The osseous structures appear diffusely demineralized which may limit detection of small or nondisplaced fractures. No acute  osseous abnormality or traumatic malalignment. Mild elevation of the humeral head is present and a background mild to moderate acromioclavicular and glenohumeral degenerative changes. Some undersurface spurring of the acromion is present. IMPRESSION: High-riding appearance of the humeral head with some undersurface spurring of the acromion may reflect a constellation of rotator cuff insufficiency and features of subacromial impingement given the mid range of motion. Background of mild to moderate glenohumeral acromioclavicular arthrosis. Diffuse bony demineralization. Electronically Signed   By: Lovena Le M.D.   On: 09/08/2019 23:17   CT ABDOMEN PELVIS W CONTRAST  Result Date: 09/22/2019 CLINICAL DATA:  Adenopathy. Myalgia and arthralgia. Evaluate for malignancy and dermatomyositis. EXAM: CT ABDOMEN AND PELVIS WITH CONTRAST TECHNIQUE: Multidetector CT imaging of the abdomen and pelvis was performed using the standard protocol following bolus administration of intravenous contrast. CONTRAST:  1107mL OMNIPAQUE IOHEXOL 300 MG/ML  SOLN COMPARISON:  08/18/2018 FINDINGS: Lower chest: No acute abnormality. Hepatobiliary: No suspicious liver abnormality identified. Gallbladder unremarkable. No biliary dilatation.  Pancreas: No focal liver abnormality is seen. No gallstones, gallbladder wall thickening, or biliary dilatation. Spleen: Normal in size without focal abnormality. Adrenals/Urinary Tract: Normal appearance of the adrenal glands. Mild bilateral renal cortical lobulation identified. No mass or hydronephrosis. Urinary bladder appears unremarkable. Stomach/Bowel: Stomach appears normal. Enteric contrast material is identified throughout the small bowel loops and into the colon up to the sigmoid colon. Within the central abdomen there is a abnormal appearing loop of mid ileum with asymmetric wall thickening. Here, several converging loops small bowel appear tethered and there is increase caliber of the a Ferrante small-bowel loop which measures up to 3.6 cm. Mild surrounding fat stranding is identified and there is a focal area of fluid extending into the umbilical hernia measuring 2.3 x 3.9 cm. Vascular/Lymphatic: Aortic atherosclerosis. No aneurysm. No abdominopelvic adenopathy identified. Reproductive: Prostate is unremarkable. Other: Fat and fluid containing umbilical hernia noted. Musculoskeletal: SI joints appear unremarkable. Peripherally sclerotic lesion within the posterior right iliac bone is unchanged and likely represents a benign abnormality. No acute or suspicious bone lesions. Unchanged Schmorl's node and superior endplate deformity involving the L2 vertebra. IMPRESSION: 1. Focal loop of mid ileum with asymmetric wall thickening and surrounding fluid and mild soft tissue stranding concerning for enteritis. Several adjacent small bowel loops appear to converge with the thickened loop of bowel which may reflect adhesions or postinflammatory scarring. Findings may be inflammatory or infectious in etiology. Cannot exclude inflammatory bowel disease. 2. Suspect partial small bowel obstruction with transition point in the central abdomen, at the level of the thickened bowel loop. 3. No suspicious mass or  adenopathy identified within the abdomen or pelvis. 4.  Aortic Atherosclerosis (ICD10-I70.0). 5. These results will be called to the ordering clinician or representative by the Radiologist Assistant, and communication documented in the PACS or zVision Dashboard. Electronically Signed   By: Kerby Moors M.D.   On: 09/22/2019 08:09   DG Shoulder Left  Result Date: 09/08/2019 CLINICAL DATA:  Bilateral shoulder pain and lack of range of motion EXAM: LEFT SHOULDER - 2+ VIEW COMPARISON:  None. FINDINGS: The osseous structures appear diffusely demineralized which may limit detection of small or nondisplaced fractures. No acute bony abnormality. Specifically, no fracture, subluxation, or dislocation. Mild glenohumeral acromioclavicular arthrosis. Slightly high-riding appearance of the humeral head. IMPRESSION: No acute fracture or traumatic malalignment. Mildly high-riding appearance of the humeral head may reflect rotator cuff insufficiency. Electronically Signed   By: Lovena Le M.D.   On: 09/08/2019 23:18  [place] DRAIN/INJ MAJOR JOINT/BURSA  Result Date: 09/05/2019 CLINICAL DATA:  Recurrent painful left knee effusion post arthrocentesis x3 EXAM: [place] DRAIN/INJ LARGE JOINT/BURSA COMPARISON:  None. TECHNIQUE: Survey ultrasound of the popliteal fossa was performed and no significant Baker's cyst identified. This was consistent with findings of recent MR knee 08/28/2019. Findings were reviewed with Dr. Harlow Mares. Subsequently, the moderate effusion in the suprapatellar bursa was localized and appropriate lateral skin entry point determined and marked. Region prepped with Betadine, draped in usual sterile fashion, infiltrated locally with 1% lidocaine. 18 gauge spinal needle advanced into the suprapatellar bursa but scant fluid returned. Needle removed and a 5 [place] multi sidehole Yueh sheath needle advanced. Approximately approximately 29ml of clear straw-colored fluid were aspirated. This was sent for the requested  laboratory studies including culture and cell count. Patient tolerated procedure and was discharged home in good condition. IMPRESSION: 1. Technically successful aspiration left suprapatellar bursa. 2. No Baker's cyst identified. Electronically Signed   By: Lucrezia Europe M.D.   On: 09/05/2019 15:07   [place] LIMITED JOINT SPACE STRUCTURES UP BILAT(NO LINKED CHARGES)  Result Date: 09/27/2019 Procedure: Real-time Ultrasound Guided Injection of right glenohumeral joint Device: Philips Affiniti 50G Images permanently stored and available for review in the ultrasound unit. Verbal informed consent obtained. Discussed risks and benefits of procedure. Warned about infection bleeding damage to structures skin hypopigmentation and fat atrophy among others. Patient expresses understanding and agreement Time-out conducted.  Noted no overlying erythema, induration, or other signs of local infection.  Skin prepped in a sterile fashion.  Local anesthesia: Topical Ethyl chloride.  With sterile technique and under real time ultrasound guidance: 40 mg of Kenalog and 3 mL of Marcaine injected easily.  Completed without difficulty  Pain immediately resolved suggesting accurate placement of the medication.  Advised to call if fevers/chills, erythema, induration, drainage, or persistent bleeding.  Images permanently stored and available for review in the ultrasound unit. Impression: Technically successful ultrasound guided injection.  Procedure: Real-time Ultrasound Guided Injection of left glenohumeral joint Device: Philips Affiniti 50G Images permanently stored and available for review in the ultrasound unit. Verbal informed consent obtained. Discussed risks and benefits of procedure. Warned about infection bleeding damage to structures skin hypopigmentation and fat atrophy among others. Patient expresses understanding and agreement Time-out conducted.  Noted no overlying erythema, induration, or other signs of local infection.   Skin prepped in a sterile fashion.  Local anesthesia: Topical Ethyl chloride.  With sterile technique and under real time ultrasound guidance: 40 mg of Kenalog and 3 mL of Marcaine injected easily.  Completed without difficulty  Pain immediately resolved suggesting accurate placement of the medication.  Advised to call if fevers/chills, erythema, induration, drainage, or persistent bleeding.  Images permanently stored and available for review in the ultrasound unit. Impression: Technically successful ultrasound guided injection.   Assessment and Plan:   Marqueze Pea is a 59 y.o. y/o male has been referred for abnormal CT and abdominal pain, with recent diagnosis of seronegative arthritis, currently on prednisone taper by rheumatology  Assessment and Plan: Patient history is quite atypical with new joint pain, synovitis as of December 2020.  Patient following with rheumatology for the same. They are trying to rule out Polyarteritis nodosa and have ordered MRA Abdomen. They feel the CT abdomen findings are incidental especially given normal colonoscopy last year.   New CT findings indicate further evaluation.  I discussed the CT with radiology and they recommend an MRE or CTE for further evaluation if we would  like to rule out IBD.  The area of thickening in the small bowel is 30 cm proximal to the ileocecal valve and may not be able to reached by colonoscopy.  Based on imaging results, patient will likely need repeat colonoscopy to evaluate the colon, obtain biopsies, and attempt intubation of the ileocecal valve as well. However, radiologist does not think that his pending MRA and MRE can be done together. I discussed it with his rheumatologist, [name] and she feels the MRA is important at this time. Await MRA results, and if unrevealing of diagnosis, we can order MRE then.   Obtain fecal calprotectin  Abdominal symptoms may have improved with the prednisone (as per patient) that he is on  per rheumatology.    We will also refer to surgery due to patient reporting chronic umbilical hernia and given the CT findings to ensure the hernia is not causing the partial SBO or abnormal imaging findings  Mildly elevated transaminases noted.  Patient may have underlying fatty liver.  obtain further work-up with viral autoimmune hepatitis labs next visit  Follow Up Instructions:   I discussed the assessment and treatment plan with the patient. The patient was provided an opportunity to ask questions and all were answered. The patient agreed with the plan and demonstrated an understanding of the instructions.   The patient was advised to call back or seek an in-person evaluation if the symptoms worsen or if the condition fails to improve as anticipated.  I provided 17 minutes of face-to-face time via video software during this encounter.  Additional time was spent in reviewing patient's chart, placing orders etc.   Virgel Manifold, MD  Speech recognition software was used to dictate the above note.

## 2019-10-06 ENCOUNTER — Other Ambulatory Visit: Payer: Self-pay

## 2019-10-06 ENCOUNTER — Ambulatory Visit
Admission: RE | Admit: 2019-10-06 | Discharge: 2019-10-06 | Disposition: A | Discharge status: Home / Self Care | Payer: Commercial Managed Care - PPO | Source: Ambulatory Visit | Attending: Internal Medicine | Admitting: Internal Medicine

## 2019-10-06 ENCOUNTER — Telehealth: Payer: Self-pay | Admitting: Gastroenterology

## 2019-10-06 DIAGNOSIS — R1030 Lower abdominal pain, unspecified: Secondary | ICD-10-CM | POA: Insufficient documentation

## 2019-10-06 DIAGNOSIS — K56600 Partial intestinal obstruction, unspecified as to cause: Secondary | ICD-10-CM | POA: Diagnosis present

## 2019-10-06 DIAGNOSIS — R634 Abnormal weight loss: Secondary | ICD-10-CM | POA: Diagnosis present

## 2019-10-06 IMAGING — MR MR MRA ABDOMEN W/ OR W/O CM
13 of 15 series · 42 of 48 positions shown · IV contrast (Contrast agent)
Comparison: [DATE]

CLINICAL DATA: Partial small-bowel obstruction, lower abdominal
pain, weight loss

EXAM:
MRA ABDOMEN WITH CONTRAST
TECHNIQUE: Multiplanar, multiecho pulse sequences of the abdomen were obtained
with intravenous contrast. Angiographic images of abdomen was
obtained using MRA technique with intravenous contrast.
CONTRAST:  9mL GADAVIST GADOBUTROL 1 MMOL/ML IV SOLN

[Series 8: bSSFP · coronal · 5.0mm · 0.74mm/px · 1 of 35 slices shown (1 of 2)]
[im 1/35]
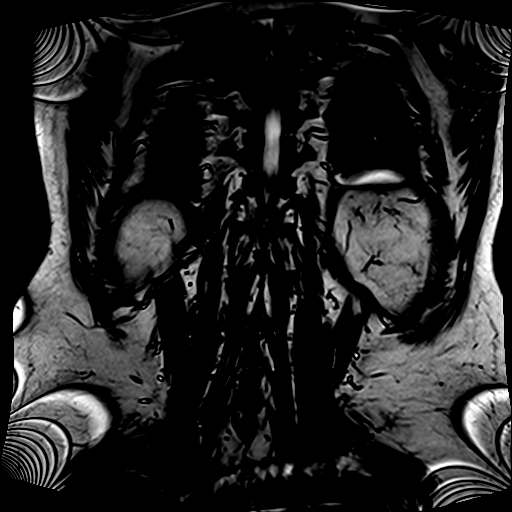

[Series 9: ax haste db · axial · 6.0mm · 1.19mm/px · 1 of 10 slices shown (1 of 2)]
[im 1/10]
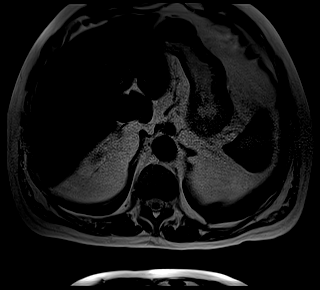

[Series 10: ax haste db · axial · 6.0mm · 1.19mm/px · 1 of 30 slices shown (2 of 2)]
[im 1/30]
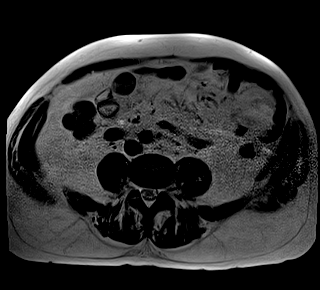

[Series 12: T2 fat-sat · axial · 6.0mm · 1.19mm/px · z∈[+10,+218]mm · 2 of 30 slices shown]
[im 1/30]
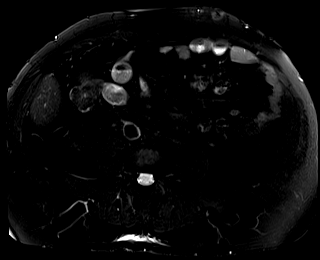
[im 30/30]
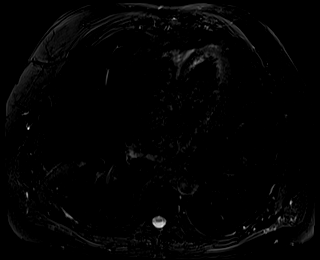

[Series 13: bSSFP · axial · 6.0mm · 0.74mm/px · z∈[-3,+231]mm · 2 of 40 slices shown (2 of 2)]
[im 1/40]
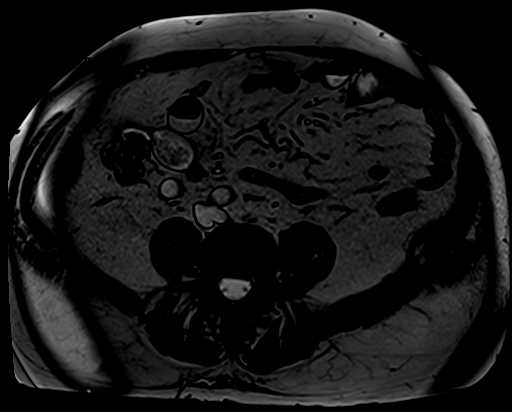
[im 40/40]
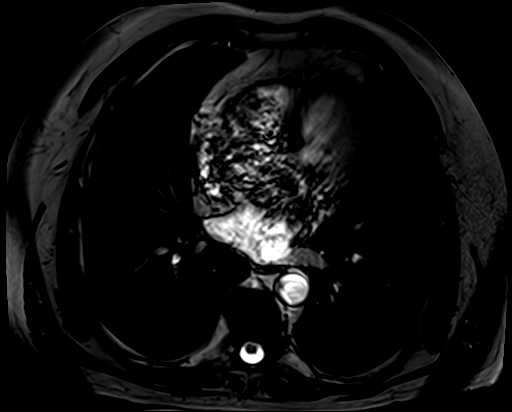

[Series 14: t1_vibe_fs_tra_p4_bh_pre · axial · 3.0mm · 1.19mm/px · z∈[+7,+220]mm · 4 of 72 slices shown]
[im 1/72]
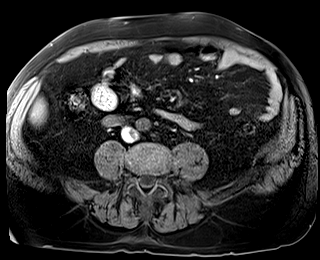
[im 24/72]
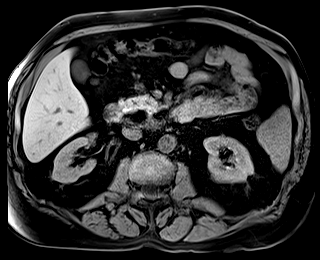
[im 48/72]
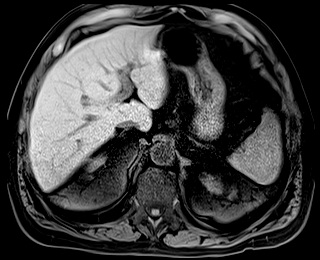
[im 72/72]
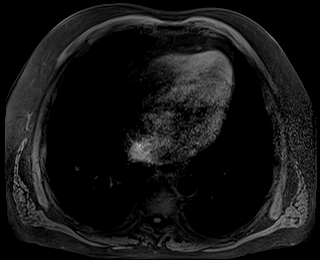

[Series 15: T1 dynamic · coronal · 3.5mm · 1.31mm/px · 4 of 72 slices shown (1 of 2)]
[im 1/72]
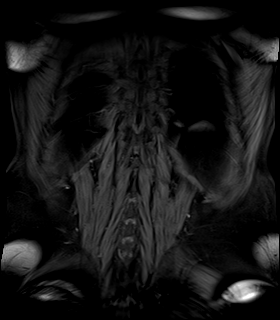
[im 24/72]
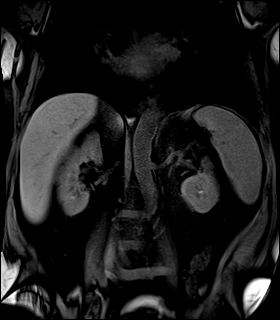
[im 48/72]
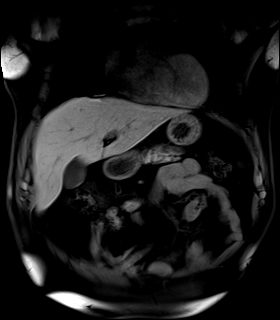
[im 72/72]
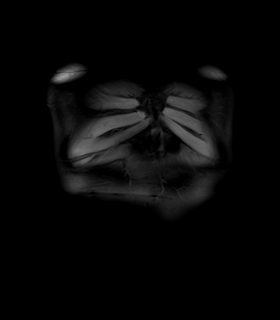

[Series 16: angio_fl3d_cor_pre · coronal · 1.1mm · 1.17mm/px · 6 of 112 slices shown (1 of 2)]
[im 1/112]
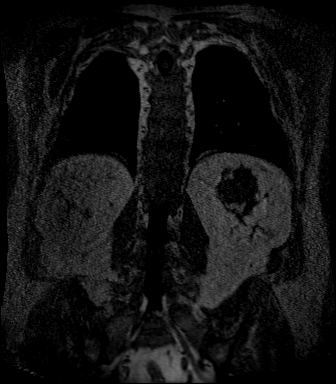
[im 23/112]
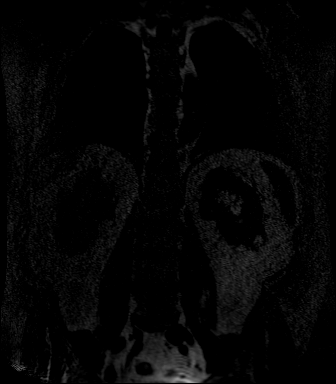
[im 45/112]
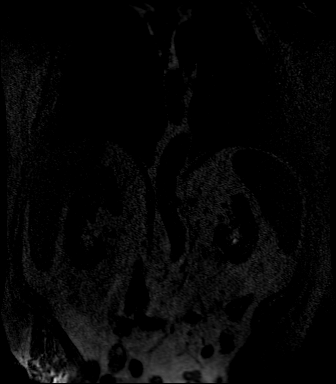
[im 67/112]
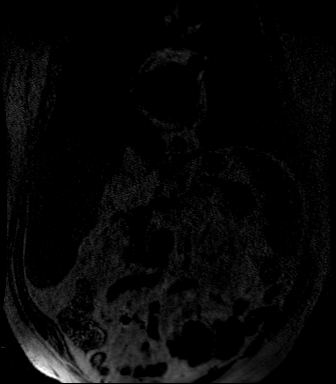
[im 89/112]
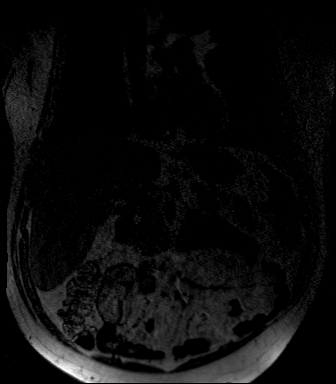
[im 112/112]
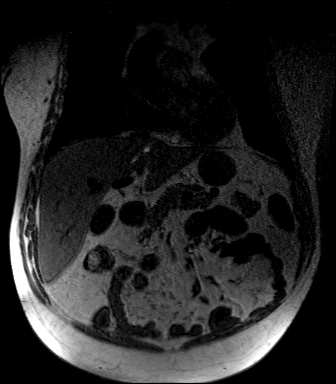

[Series 18: angio_fl3d_cor_pre · coronal · 1.1mm · 1.17mm/px · 6 of 112 slices shown (2 of 2)]
[im 1/112]
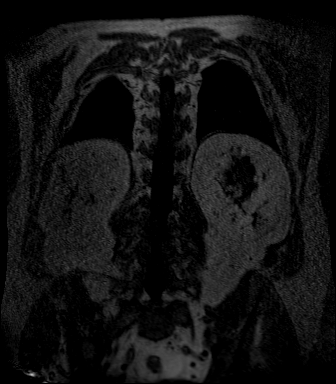
[im 23/112]
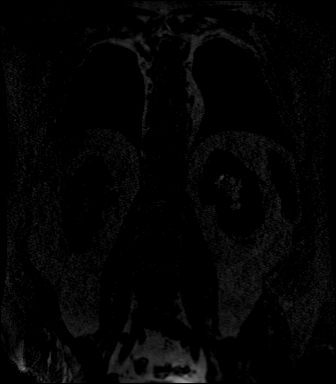
[im 45/112]
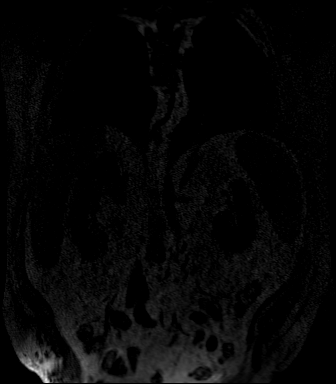
[im 67/112]
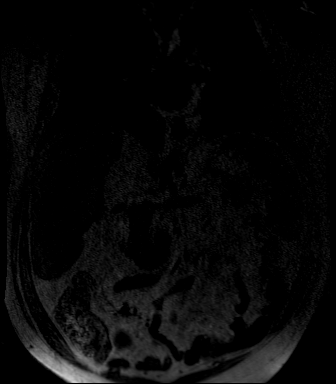
[im 89/112]
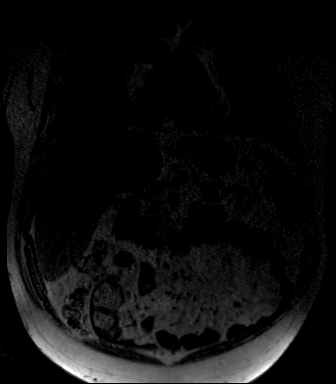
[im 112/112]
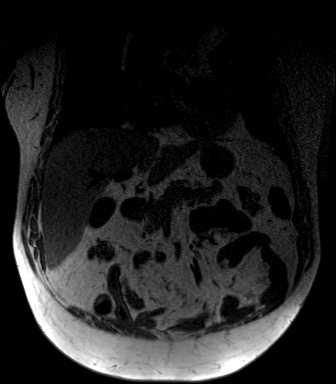

[Series 20: angio_fl3d_cor_post · coronal · 1.1mm · 1.17mm/px · 6 of 112 slices shown]
[im 1/112]
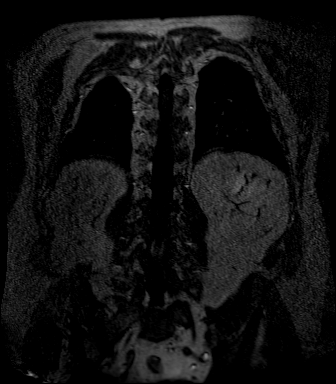
[im 23/112]
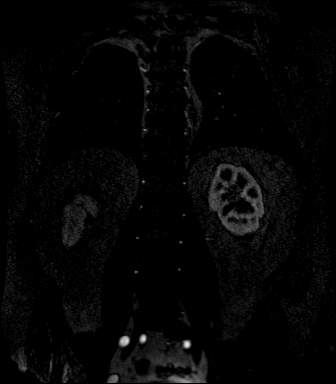
[im 45/112]
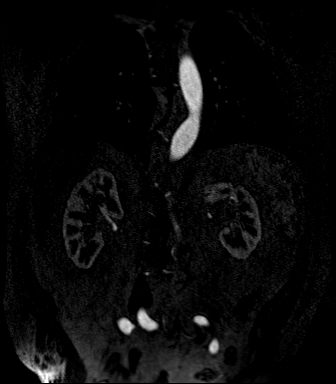
[im 67/112]
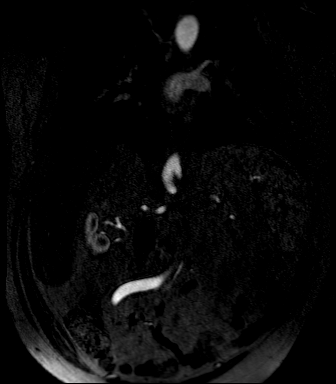
[im 89/112]
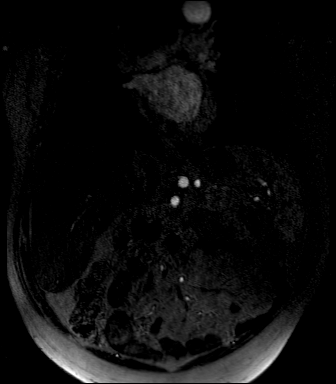
[im 112/112]
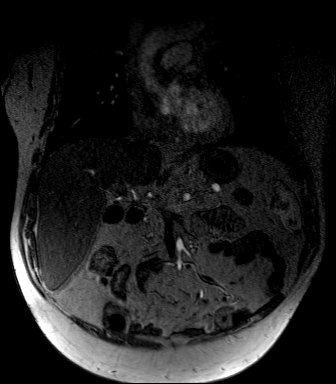

[Series 21: t1_vibe_fs_tra_p4_bh · axial · 3.0mm · 1.19mm/px · z∈[+7,+220]mm · 4 of 72 slices shown]
[im 1/72]
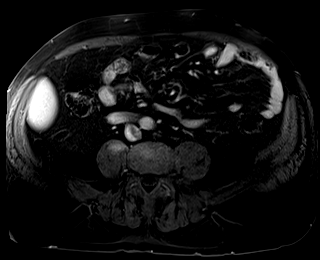
[im 24/72]
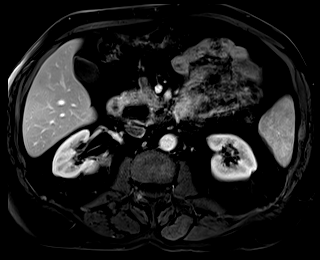
[im 48/72]
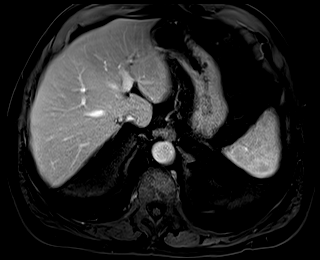
[im 72/72]
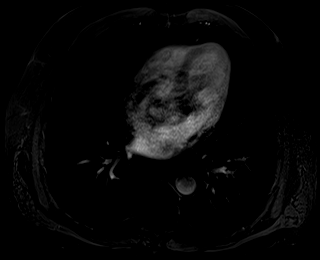

[Series 22: t1_vibe_fs_tra_p4_bh_sub · axial · 3.0mm · 1.19mm/px · z∈[+7,+220]mm · 4 of 72 slices shown]
[im 1/72]
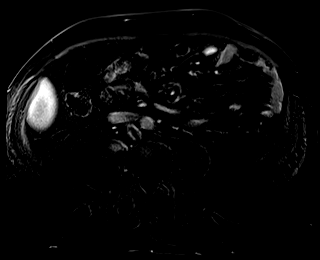
[im 24/72]
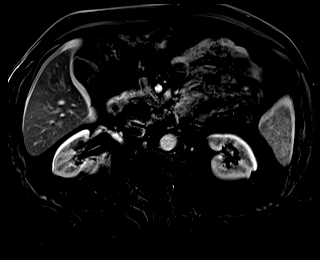
[im 48/72]
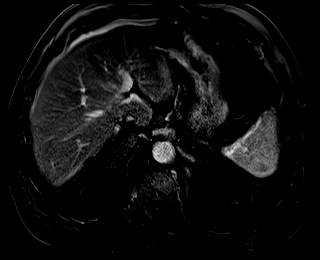
[im 72/72]
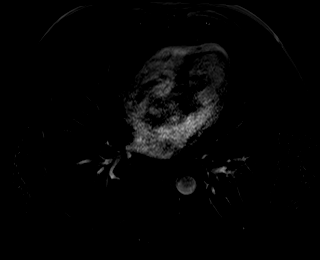

[Series 23: T1 dynamic · coronal · 3.5mm · 1.31mm/px · 1 of 72 slices shown (2 of 2)]
[im 1/72]
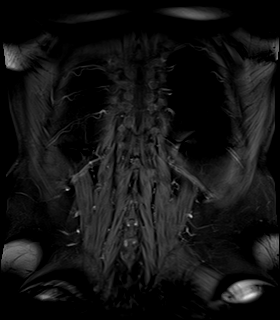

[42 of 48 positions shown; findings below may reference images not displayed]

FINDINGS: ARTERIAL

Aorta: Minor atherosclerotic change tortuosity occlusive,
dissection.

Celiac axis:          Widely patent origin including its branches

Superior mesenteric:  Widely patent origin including its branches

Left renal:           Widely patent.  No accessory renal artery

Right renal:          Widely patent.  No accessory renal artery

Inferior mesenteric: Remains patent off the distal aorta including
its branches

Left iliac: Iliac tortuosity noted without acute vascular finding or
occlusive process

Right iliac: Similar iliac tortuosity without acute vascular finding
or occlusive process

VENOUS

No PEI process

NONVASCULAR

Lower chest:  No acute findings.

Hepatobiliary: No masses or other significant abnormality.

Pancreas: No mass, inflammatory changes, or other significant
abnormality.

Spleen: Within normal limits in size and appearance.

Adrenals/Urinary Tract: No masses identified. No evidence of
hydronephrosis.

Stomach/Bowel: Negative for bowel obstruction, significant
dilatation, or ileus. Incidental duodenal diverticula noted.

Lymphatic: No pathologically enlarged lymph nodes.

Other: None.

Musculoskeletal: No suspicious bone lesions identified.
IMPRESSION: Minor aortic atherosclerosis and tortuosity.

Patent mesenteric and renal vasculature. No acute vascular finding
or occlusive process.

## 2019-10-06 MED ORDER — GADOBUTROL 1 MMOL/ML IV SOLN
9.0000 mL | Freq: Once | INTRAVENOUS | Status: AC | PRN
  Administered 2019-10-06: 9 mL via INTRAVENOUS

## 2019-10-06 NOTE — Progress Notes (Signed)
Patient did know about his appointments, days, times, etc. He wanted let you know that he does have his MRI today @ 5:30 & wanted you to be able to see this too. I let him know that [name] would be the one to call him about this. He said that he has a hard time understanding her at times & hoped that you would look at this too.

## 2019-10-06 NOTE — Telephone Encounter (Signed)
[facility] from [place] called to let us know they received our referral and called pt to offer apt and pt wanted them to call next week to make the apt

## 2019-10-07 ENCOUNTER — Telehealth: Payer: Self-pay | Admitting: Family

## 2019-10-07 NOTE — Telephone Encounter (Signed)
Pt wants to know MRI results before end of day

## 2019-10-07 NOTE — Telephone Encounter (Signed)
Call pt I looked at MR abdomen, unfortunately radiologist has not read this report.  There are no results there for me to even try to interpret.  I know this is frustrating patient.  Please tell him I am sure [name] will call him as soon as she gets results.  However, there are no results there to interpret right now

## 2019-10-07 NOTE — Telephone Encounter (Signed)
I spoke with patient yesterday & advised him that [name] would be the one calling with these results. He really wanted you to take a look so he would hear something from you by then end of the day. Patient has a hard time understanding [name].

## 2019-10-07 NOTE — Telephone Encounter (Signed)
I called and advised patient of below. He was grateful we were able to get with him. I assured him if anything URGENT he would be called by someone.

## 2019-10-10 ENCOUNTER — Ambulatory Visit: Payer: Commercial Managed Care - PPO | Admitting: Family Medicine

## 2019-10-10 DIAGNOSIS — Z0289 Encounter for other administrative examinations: Secondary | ICD-10-CM

## 2019-10-10 LAB — CALPROTECTIN, FECAL: Calprotectin, Fecal: 61 ug/g (ref 0–120)

## 2019-10-10 NOTE — Progress Notes (Deleted)
   I, Wendy Poet, LAT, ATC, am serving as scribe for Dr. Lynne Leader.  Jake Liu is a 59 y.o. male who presents to [place] [facility] today for f/u of polyarthralgia, including B shoulders and L knee.  He was last seen by Dr. Georgina Snell on 09/19/19 and had B shoulder injections.  He was seen by Rheumatology on 09/27/19 and diagnosed w/ seronegative arthritis and placed on Methotrexate 2.5mg , prednisone 20mg  and folic acid.  Since his last visit, pt reports   Pertinent review of systems: ***  Relevant historical information: ***   Exam:  There were no vitals taken for this visit. General: Well Developed, well nourished, and in no acute distress.   MSK: ***    Lab and Radiology Results No results found for this or any previous visit (from the past 72 hour(s)). MR ANGIO ABDOMEN W WO CONTRAST  Result Date: 10/08/2019 CLINICAL DATA:  Partial small-bowel obstruction, lower abdominal pain, weight loss EXAM: MRA ABDOMEN WITH CONTRAST TECHNIQUE: Multiplanar, multiecho pulse sequences of the abdomen were obtained with intravenous contrast. Angiographic images of abdomen was obtained using MRA technique with intravenous contrast. CONTRAST:  45mL GADAVIST GADOBUTROL 1 MMOL/ML IV SOLN COMPARISON:  09/21/2019 FINDINGS: ARTERIAL Aorta: Minor atherosclerotic change tortuosity occlusive, dissection. Celiac axis:          Widely patent origin including its branches Superior mesenteric:  Widely patent origin including its branches Left renal:           Widely patent.  No accessory renal artery Right renal:          Widely patent.  No accessory renal artery Inferior mesenteric: Remains patent off the distal aorta including its branches Left iliac: Iliac tortuosity noted without acute vascular finding or occlusive process Right iliac: Similar iliac tortuosity without acute vascular finding or occlusive process VENOUS No veno-occlusive process NONVASCULAR Lower chest:  No acute findings.  Hepatobiliary: No masses or other significant abnormality. Pancreas: No mass, inflammatory changes, or other significant abnormality. Spleen: Within normal limits in size and appearance. Adrenals/Urinary Tract: No masses identified. No evidence of hydronephrosis. Stomach/Bowel: Negative for bowel obstruction, significant dilatation, or ileus. Incidental duodenal diverticula noted. Lymphatic: No pathologically enlarged lymph nodes. Other: None. Musculoskeletal: No suspicious bone lesions identified. IMPRESSION: Minor aortic atherosclerosis and tortuosity. Patent mesenteric and renal vasculature. No acute vascular finding or occlusive process. Electronically Signed   By: Jerilynn Mages.  Shick M.D.   On: 10/08/2019 09:26       Assessment and Plan: 59 y.o. male with ***   PDMP not reviewed this encounter. No orders of the defined types were placed in this encounter.  No orders of the defined types were placed in this encounter.    Discussed warning signs or symptoms. Please see discharge instructions. Patient expresses understanding.   ***

## 2019-10-13 ENCOUNTER — Telehealth: Payer: Self-pay

## 2019-10-13 NOTE — Telephone Encounter (Signed)
Called patient and patient verbalized understanding. Patient states he is having sever abdominal pain all the time that radiates to his lower stomach. He states his stomach crawls all the time. Patient states he has diarrhea after he drinks his morning coffee. Patient states he wants this scan as soon as possible.   Called over the central scheduling got him scheduled on 10/24/2019 is at 7:00pm arrive at 5:45pm. NPO 4 hours before.  Called patient and informed patient of this information. Patient verbalized understanding

## 2019-10-13 NOTE — Addendum Note (Signed)
Addended by: [name] on: 10/13/2019 09:57 AM   Modules accepted: Orders

## 2019-10-13 NOTE — Telephone Encounter (Signed)
Patient is scheduled With Dr. Dema Severin with [facility] on 11/10/2019 at 4:00pm

## 2019-10-13 NOTE — Telephone Encounter (Signed)
-----   Message from Virgel Manifold, MD sent at 10/13/2019  9:59 AM EDT ----- Caryl Pina please let the patient know, his stool marker for inflammation was mildly elevated. Since his MRA that rheumatology ordered did not reveal a cause for his symptoms, I had discussed his CT scan with the radiologist previously. An MRE will allow for further evaluation of the small bowel that is abnormal on CT and I have ordered this study. Please schedule.

## 2019-10-20 ENCOUNTER — Telehealth: Payer: Self-pay | Admitting: Gastroenterology

## 2019-10-20 DIAGNOSIS — R935 Abnormal findings on diagnostic imaging of other abdominal regions, including retroperitoneum: Secondary | ICD-10-CM

## 2019-10-20 DIAGNOSIS — R109 Unspecified abdominal pain: Secondary | ICD-10-CM

## 2019-10-20 NOTE — Telephone Encounter (Signed)
Added order to the MRI department.

## 2019-10-20 NOTE — Telephone Encounter (Signed)
Jake Liu WITH MRI AT [facility] PATIENT IS SCHEDULED TO HAVE ABD MRI & NEEDS A ENTRO PELVIC ADDED WITH & WITHOUT CONTRAST.SCHEDULED FOR 10-24-19.

## 2019-10-24 ENCOUNTER — Other Ambulatory Visit: Payer: Self-pay

## 2019-10-24 ENCOUNTER — Ambulatory Visit
Admission: RE | Admit: 2019-10-24 | Discharge: 2019-10-24 | Disposition: A | Discharge status: Home / Self Care | Payer: Commercial Managed Care - PPO | Source: Ambulatory Visit | Attending: Gastroenterology | Admitting: Gastroenterology

## 2019-10-24 DIAGNOSIS — R933 Abnormal findings on diagnostic imaging of other parts of digestive tract: Secondary | ICD-10-CM | POA: Insufficient documentation

## 2019-10-24 DIAGNOSIS — R109 Unspecified abdominal pain: Secondary | ICD-10-CM | POA: Diagnosis not present

## 2019-10-24 IMAGING — MR MR [PERSON_NAME] PELVIS W/CM
16 series · 48 of 48 positions shown · IV contrast (gadavist)
Comparison: CT abdomen/pelvis dated [DATE]

CLINICAL DATA: Small bowel obstruction versus enteritis on CT

EXAM:
MRI ABDOMEN AND PELVIS WITHOUT AND WITH CONTRAST
TECHNIQUE: Multiplanar multisequence MR imaging of the abdomen and pelvis was
performed both before and after the administration of intravenous
contrast.
CONTRAST:  9mL GADAVIST GADOBUTROL 1 MMOL/ML IV SOLN

[Series 6: t2_haste_tra_p2_mbh_comp · axial · 5.0mm · 1.25mm/px · z∈[-144,+221]mm · 3 of 74 slices shown]
[im 1/74]
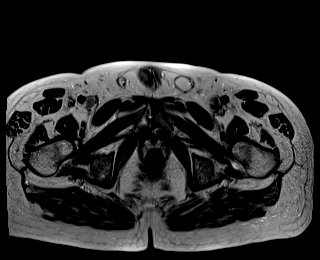
[im 37/74]
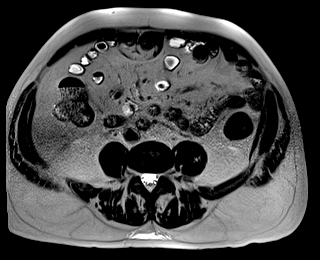
[im 74/74]
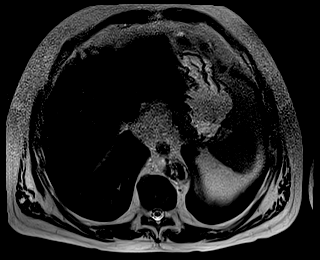

[Series 7: cor true fisp · coronal · 5.0mm · 0.70mm/px · 1 of 45 slices shown]
[im 1/45]
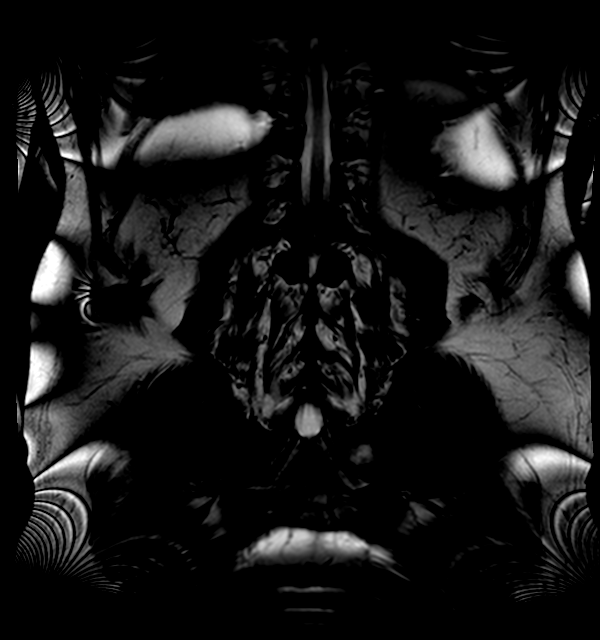

[Series 8: cor haste mbh · coronal · 5.0mm · 0.88mm/px · 1 of 45 slices shown]
[im 1/45]
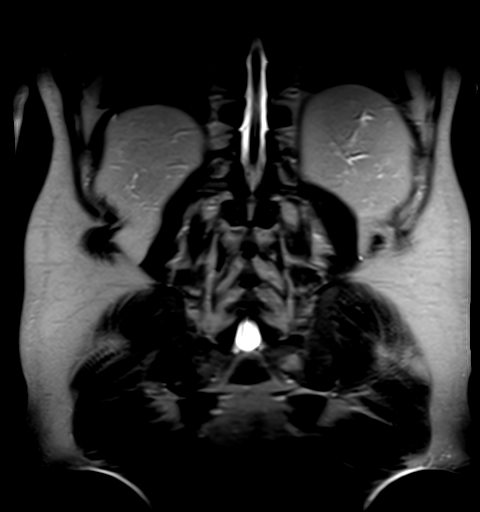

[Series 13: ax dwi_adc_comp · axial · 5.0mm · 1.49mm/px · z∈[-147,+228]mm · 2 of 76 slices shown]
[im 1/76]
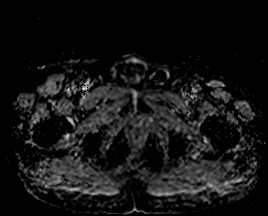
[im 76/76]
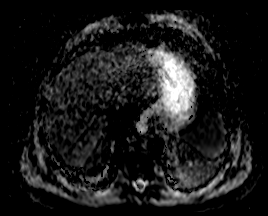

[Series 14: ax dwi_tracew_comp · axial · 5.0mm · 1.49mm/px · z∈[-147,+228]mm · 6 of 228 slices shown]
[im 1/228]
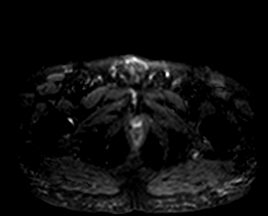
[im 46/228]
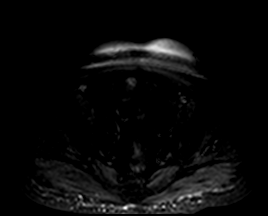
[im 91/228]
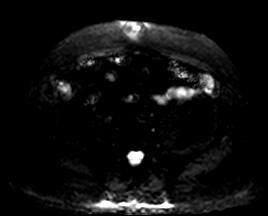
[im 137/228]
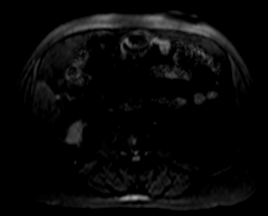
[im 182/228]
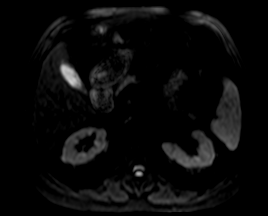
[im 228/228]
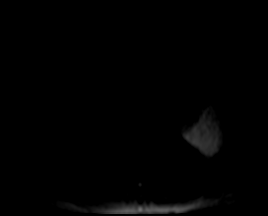

[Series 15: T1 dynamic · axial · 3.0mm · 1.64mm/px · z∈[-141,+216]mm · 3 of 120 slices shown (1 of 11)]
[im 1/120]
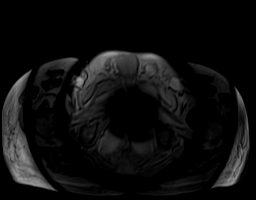
[im 60/120]
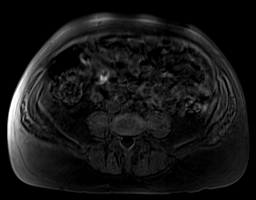
[im 120/120]
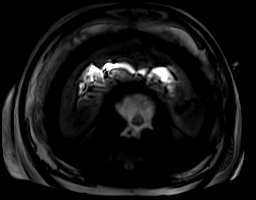

[Series 16: T1 dynamic · axial · 3.0mm · 1.64mm/px · z∈[-141,+216]mm · 3 of 120 slices shown (2 of 11)]
[im 1/120]
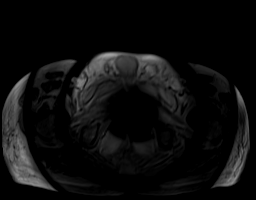
[im 60/120]
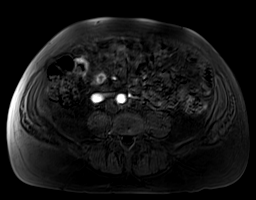
[im 120/120]
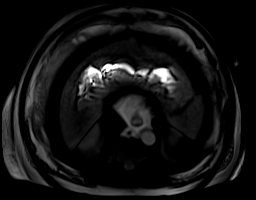

[Series 17: T1 dynamic · axial · 3.0mm · 1.64mm/px · z∈[-141,+216]mm · 3 of 120 slices shown (3 of 11)]
[im 1/120]
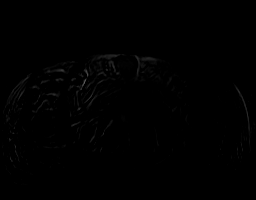
[im 60/120]
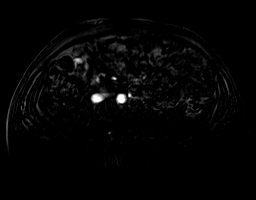
[im 120/120]
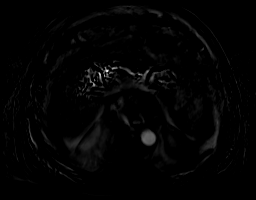

[Series 18: T1 dynamic · axial · 3.0mm · 1.64mm/px · z∈[-141,+216]mm · 3 of 120 slices shown (4 of 11)]
[im 1/120]
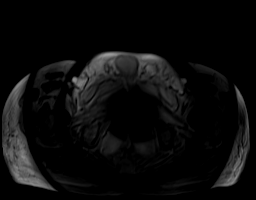
[im 60/120]
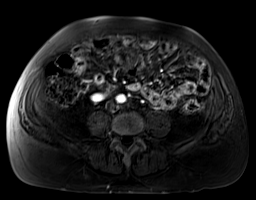
[im 120/120]
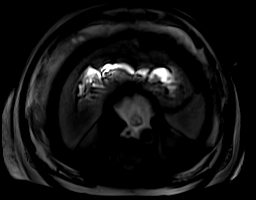

[Series 19: T1 dynamic · axial · 3.0mm · 1.64mm/px · z∈[-138,+216]mm · 3 of 119 slices shown (5 of 11)]
[im 1/119]
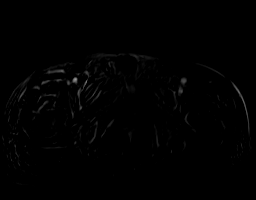
[im 60/119]
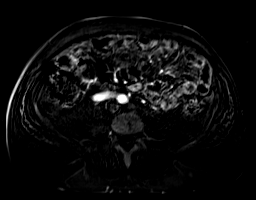
[im 119/119]
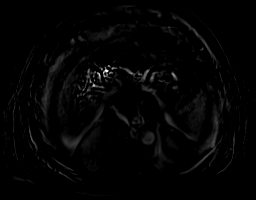

[Series 20: T1 dynamic · axial · 3.0mm · 1.64mm/px · z∈[-141,+216]mm · 3 of 120 slices shown (6 of 11)]
[im 1/120]
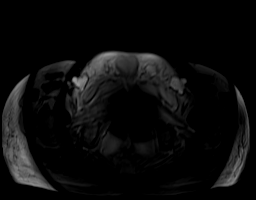
[im 60/120]
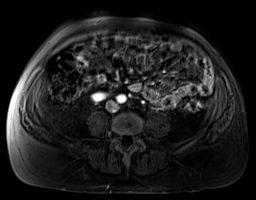
[im 120/120]
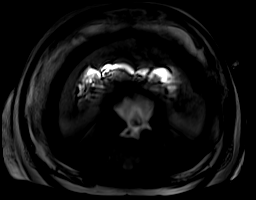

[Series 21: T1 dynamic · axial · 3.0mm · 1.64mm/px · z∈[-141,+216]mm · 3 of 120 slices shown (7 of 11)]
[im 1/120]
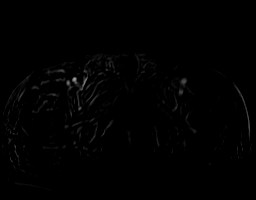
[im 60/120]
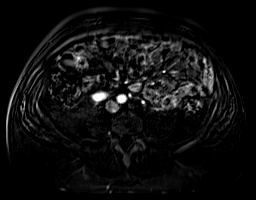
[im 120/120]
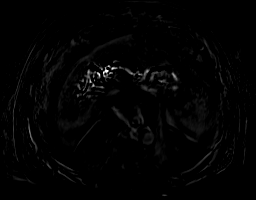

[Series 22: T1 dynamic · coronal · 1.6mm · 1.76mm/px · 4 of 160 slices shown (8 of 11)]
[im 1/160]
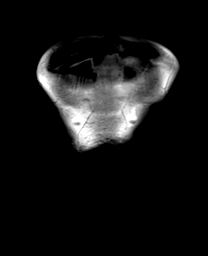
[im 54/160]
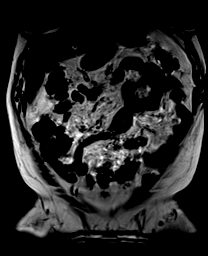
[im 107/160]
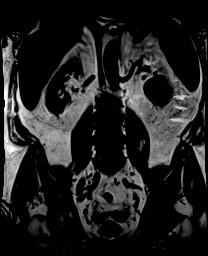
[im 160/160]
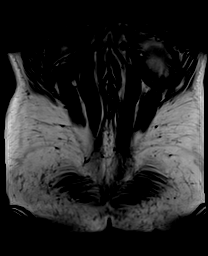

[Series 23: T1 dynamic · coronal · 1.6mm · 1.76mm/px · 4 of 160 slices shown (9 of 11)]
[im 1/160]
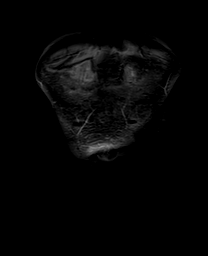
[im 54/160]
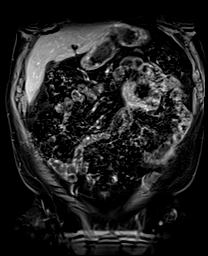
[im 107/160]
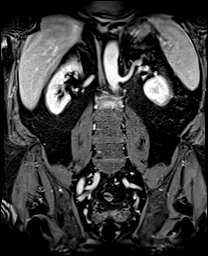
[im 160/160]
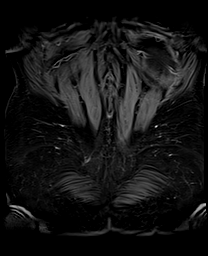

[Series 24: T1 dynamic · axial · 3.0mm · 1.64mm/px · z∈[-141,+216]mm · 3 of 120 slices shown (10 of 11)]
[im 1/120]
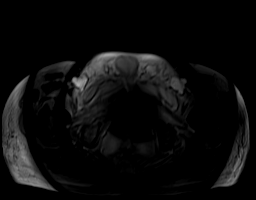
[im 60/120]
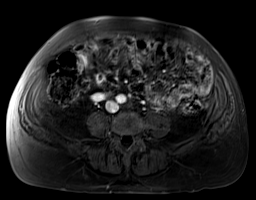
[im 120/120]
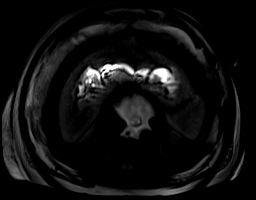

[Series 25: T1 dynamic · axial · 3.0mm · 1.64mm/px · z∈[-141,+216]mm · 3 of 120 slices shown (11 of 11)]
[im 1/120]
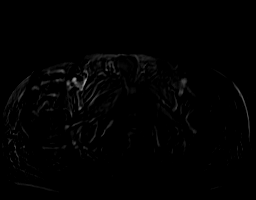
[im 60/120]
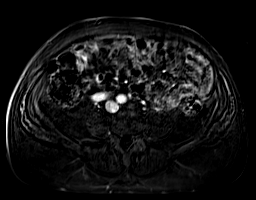
[im 120/120]
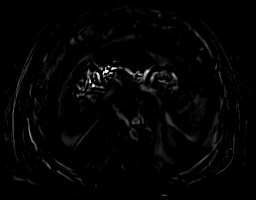

[48 of 48 positions shown; findings below may reference images not displayed]

FINDINGS: Lower chest: Lung bases are clear.

Hepatobiliary: Liver is within normal limits.

Gallbladder is unremarkable. No intrahepatic or extrahepatic ductal
dilatation.

Pancreas:  Within normal limits.

Spleen:  Within normal limits.

Adrenals/Urinary Tract:  Adrenal glands are within normal limits.

Kidneys are within normal limits.  No hydronephrosis.

Bladder is within normal limits.

Stomach/Bowel: Stomach is within normal limits.

No small bowel wall thickening or inflammatory changes.

Suspected postsurgical changes related to ventral hernia mesh repair
(series 6/image 33).

Small fat containing periumbilical hernia (series 6/image 45). Prior
fluid on recent CT has essentially resolved. Two small bowel loops
are tethered just beneath the umbilical hernia (series 6/images 42
and 43), without associated small bowel obstruction.

Left colonic diverticulosis, without evidence of diverticulitis.

Vascular/Lymphatic:  No evidence of aneurysm.

No suspicious abdominal lymphadenopathy.

Reproductive: Prostate is unremarkable.

Other:  No abdominopelvic ascites.

Mild fat in the left inguinal canal (series 6/image 31).

Musculoskeletal: No focal osseous lesions.
IMPRESSION: No small bowel wall thickening or inflammatory changes.

Left colonic diverticulosis, without evidence of diverticulitis.

Suspected postsurgical changes related to ventral hernia mesh
repair.

Small fat containing periumbilical hernia. Prior fluid on recent CT
has essentially resolved. Two small bowel loops are tethered just
beneath the umbilical hernia, without associated small bowel
obstruction.

Mild fat in the left inguinal canal.

## 2019-10-24 MED ORDER — GADOBUTROL 1 MMOL/ML IV SOLN
9.0000 mL | Freq: Once | INTRAVENOUS | Status: AC | PRN
  Administered 2019-10-24: 9 mL via INTRAVENOUS

## 2019-10-25 ENCOUNTER — Telehealth: Payer: Self-pay | Admitting: Gastroenterology

## 2019-10-25 NOTE — Telephone Encounter (Signed)
Patient came into the office to get his results for the MRI that was done on 10-24-2019.Please  call patient.

## 2019-10-25 NOTE — Telephone Encounter (Signed)
Patient called requesting results. Please call

## 2019-10-27 ENCOUNTER — Telehealth: Payer: Self-pay

## 2019-10-27 NOTE — Telephone Encounter (Signed)
-----   Message from Virgel Manifold, MD sent at 10/25/2019  1:57 PM EDT ----- Kamdon Reisig please let the patient know, his MRI did not show any small bowel thickening or inflammation. It does show small bowel loops tethered beneath his umbilical hernia and I would recommend this be evaluated by a surgeon. I believe we had referred him to one on last visit? If not, please refer him to [facility] surgery and see if we can get him an appointment with them soon.

## 2019-10-27 NOTE — Telephone Encounter (Signed)
Pt notified of results. Referral faxed to [facility].

## 2019-11-01 ENCOUNTER — Ambulatory Visit (INDEPENDENT_AMBULATORY_CARE_PROVIDER_SITE_OTHER): Payer: Commercial Managed Care - PPO | Admitting: Gastroenterology

## 2019-11-01 ENCOUNTER — Encounter: Payer: Self-pay | Admitting: Gastroenterology

## 2019-11-01 DIAGNOSIS — R109 Unspecified abdominal pain: Secondary | ICD-10-CM

## 2019-11-01 DIAGNOSIS — R748 Abnormal levels of other serum enzymes: Secondary | ICD-10-CM | POA: Diagnosis not present

## 2019-11-01 DIAGNOSIS — R935 Abnormal findings on diagnostic imaging of other abdominal regions, including retroperitoneum: Secondary | ICD-10-CM | POA: Diagnosis not present

## 2019-11-01 DIAGNOSIS — K219 Gastro-esophageal reflux disease without esophagitis: Secondary | ICD-10-CM

## 2019-11-01 MED ORDER — OMEPRAZOLE 40 MG PO CPDR: 40.0000 mg | DELAYED_RELEASE_CAPSULE | Freq: Two times a day (BID) | ORAL | 0 refills | Status: DC

## 2019-11-01 NOTE — Progress Notes (Signed)
[name], MD [address]  [place]  [place], [place] [address]  Main: [phone]  Fax: [phone]   Primary Care Physician: Burnard Hawthorne, FNP  Virtual Visit via Telephone Note  I connected with patient on 11/01/19 at 10:30 AM EDT by telephone and verified that I am speaking with the correct person using two identifiers.   I discussed the limitations, risks, security and privacy concerns of performing an evaluation and management service by telephone and the availability of in person appointments. I also discussed with the patient that there may be a patient responsible charge related to this service. The patient expressed understanding and agreed to proceed.  Location of Patient: Home Location of Provider: Home Persons involved: Patient and provider only during the visit (nursing staff and front desk staff was involved in communicating with the patient prior to the appointment, reviewing medications and checking them in)   History of Present Illness: Chief Complaint  Patient presents with  . Follow-up     HPI: Kener Cabal is a 59 y.o. male here for follow-up of abdominal pain.  Since last visit patient has had an MRE to follow-up on CT abdomen that showed mid ileum thickening.  MRE reported no small bowel wall thickening or inflammatory changes.  However, it reported "suspected postsurgical changes related to ventral hernia mesh repair... Prior fluid on recent CT has essentially resolved.  2 small bowel loops are tethered just beneath the umbilical hernia without associated small bowel obstruction."  However, patient has never had abdominal surgeries or hernia surgeries.  Patient was referred to [facility] surgery and has not had an appointment with them yet.  Continues to have abdominal pain, periumbilical.  States after drinking water has burning sensation in his umbilical region.  No altered bowel habits, blood in stool or vomiting.  Is  continuing follow-up with rheumatology for his joint issues and is on prednisone for the same.  Adhesive capsulitis is listed, and seronegative arthritis on their notes.  They also started him on methotrexate.  They had ordered an MRA to evaluate for polyarteritis nodosa given his joint symptoms and abdominal pain.  This was unrevealing.  Previous history: Was evaluated by [facility] for this last year and underwent EGD and colonoscopy which were unrevealing for etiology of pain.  Colonoscopy showed diverticulosis and internal hemorrhoids.  Repeat recommended in 5 years due to family history of colon cancer.  EGD reported gastritis and irregular Z-line.  Pathology negative for H. pylori.  Current Outpatient Medications  Medication Sig Dispense Refill  . amLODipine (NORVASC) 5 MG tablet TAKE 1 TABLET(5 MG) BY MOUTH DAILY 90 tablet 4  . lidocaine (LIDODERM) 5 % Place 1 patch onto the skin daily. Remove & Discard patch within 12 hours or as directed by MD (Patient not taking: Reported on 10/03/2019) 30 patch 0  . metoprolol succinate (TOPROL-XL) 50 MG 24 hr tablet Take 1 tablet (50 mg total) by mouth daily. Take with or immediately following a meal. 30 tablet 3  . omeprazole (PRILOSEC) 40 MG capsule TAKE 1 CAPSULE(40 MG) BY MOUTH DAILY (Patient not taking: Reported on 10/03/2019) 30 capsule 3  . predniSONE (DELTASONE) 20 MG tablet Take 2 tablets by mouth daily. Take two tablets daily for 30 days.    . rivaroxaban (XARELTO) 20 MG TABS tablet Take by mouth.     No current facility-administered medications for this visit.    Allergies as of 11/01/2019 - Review Complete 11/01/2019  Allergen  Reaction Noted  . Sulfa antibiotics Hives 03/04/2012  . Sulfasalazine Hives 07/06/2015  . Bactrim [sulfamethoxazole-trimethoprim]  03/21/2011  . Lisinopril  01/12/2012    Review of Systems:    All systems reviewed and negative except where noted in HPI.   Observations/Objective:  Labs: CMP       Component Value Date/Time   NA 134 (L) 09/16/2019 1248   K 4.0 09/16/2019 1248   CL 95 (L) 09/16/2019 1248   CO2 26 09/16/2019 1248   GLUCOSE 104 (H) 09/16/2019 1248   BUN 11 09/16/2019 1248   CREATININE 0.58 (L) 09/16/2019 1248   CREATININE 0.74 08/13/2018 1501   CALCIUM 9.3 09/16/2019 1248   PROT 7.0 09/19/2019 1048   ALBUMIN 2.7 (L) 09/16/2019 1248   AST 31 09/16/2019 1248   ALT 48 (H) 09/16/2019 1248   ALKPHOS 75 09/16/2019 1248   BILITOT 0.7 09/16/2019 1248   GFRNONAA >60 09/16/2019 1248   GFRAA >60 09/16/2019 1248   Lab Results  Component Value Date   WBC 9.7 09/16/2019   HGB 11.8 (L) 09/16/2019   HCT 36.0 (L) 09/16/2019   MCV 86.3 09/16/2019   PLT 402 (H) 09/16/2019    Imaging Studies: MR ANGIO ABDOMEN W WO CONTRAST  Result Date: 10/08/2019 CLINICAL DATA:  Partial small-bowel obstruction, lower abdominal pain, weight loss EXAM: MRA ABDOMEN WITH CONTRAST TECHNIQUE: Multiplanar, multiecho pulse sequences of the abdomen were obtained with intravenous contrast. Angiographic images of abdomen was obtained using MRA technique with intravenous contrast. CONTRAST:  76mL GADAVIST GADOBUTROL 1 MMOL/ML IV SOLN COMPARISON:  09/21/2019 FINDINGS: ARTERIAL Aorta: Minor atherosclerotic change tortuosity occlusive, dissection. Celiac axis:          Widely patent origin including its branches Superior mesenteric:  Widely patent origin including its branches Left renal:           Widely patent.  No accessory renal artery Right renal:          Widely patent.  No accessory renal artery Inferior mesenteric: Remains patent off the distal aorta including its branches Left iliac: Iliac tortuosity noted without acute vascular finding or occlusive process Right iliac: Similar iliac tortuosity without acute vascular finding or occlusive process VENOUS No veno-occlusive process NONVASCULAR Lower chest:  No acute findings. Hepatobiliary: No masses or other significant abnormality. Pancreas: No mass,  inflammatory changes, or other significant abnormality. Spleen: Within normal limits in size and appearance. Adrenals/Urinary Tract: No masses identified. No evidence of hydronephrosis. Stomach/Bowel: Negative for bowel obstruction, significant dilatation, or ileus. Incidental duodenal diverticula noted. Lymphatic: No pathologically enlarged lymph nodes. Other: None. Musculoskeletal: No suspicious bone lesions identified. IMPRESSION: Minor aortic atherosclerosis and tortuosity. Patent mesenteric and renal vasculature. No acute vascular finding or occlusive process. Electronically Signed   By: Jerilynn Mages.  Shick M.D.   On: 10/08/2019 09:26   MR ENTERO ABDOMEN W WO CONTRAST  Result Date: 10/24/2019 CLINICAL DATA:  Small bowel obstruction versus enteritis on CT EXAM: MRI ABDOMEN AND PELVIS WITHOUT AND WITH CONTRAST TECHNIQUE: Multiplanar multisequence MR imaging of the abdomen and pelvis was performed both before and after the administration of intravenous contrast. CONTRAST:  28mL GADAVIST GADOBUTROL 1 MMOL/ML IV SOLN COMPARISON:  CT abdomen/pelvis dated 09/21/2019 FINDINGS: Lower chest: Lung bases are clear. Hepatobiliary: Liver is within normal limits. Gallbladder is unremarkable. No intrahepatic or extrahepatic ductal dilatation. Pancreas:  Within normal limits. Spleen:  Within normal limits. Adrenals/Urinary Tract:  Adrenal glands are within normal limits. Kidneys are within normal limits.  No hydronephrosis. Bladder is within  normal limits. Stomach/Bowel: Stomach is within normal limits. No small bowel wall thickening or inflammatory changes. Suspected postsurgical changes related to ventral hernia mesh repair (series 6/image 33). Small fat containing periumbilical hernia (series 6/image 45). Prior fluid on recent CT has essentially resolved. Two small bowel loops are tethered just beneath the umbilical hernia (series 6/images 42 and 43), without associated small bowel obstruction. Left colonic diverticulosis, without  evidence of diverticulitis. Vascular/Lymphatic:  No evidence of aneurysm. No suspicious abdominal lymphadenopathy. Reproductive: Prostate is unremarkable. Other:  No abdominopelvic ascites. Mild fat in the left inguinal canal (series 6/image 31). Musculoskeletal: No focal osseous lesions. IMPRESSION: No small bowel wall thickening or inflammatory changes. Left colonic diverticulosis, without evidence of diverticulitis. Suspected postsurgical changes related to ventral hernia mesh repair. Small fat containing periumbilical hernia. Prior fluid on recent CT has essentially resolved. Two small bowel loops are tethered just beneath the umbilical hernia, without associated small bowel obstruction. Mild fat in the left inguinal canal. Electronically Signed   By: Julian Hy M.D.   On: 10/24/2019 23:40   MR ENTERO PELVIS W WO CONTRAST  Result Date: 10/24/2019 CLINICAL DATA:  Small bowel obstruction versus enteritis on CT EXAM: MRI ABDOMEN AND PELVIS WITHOUT AND WITH CONTRAST TECHNIQUE: Multiplanar multisequence MR imaging of the abdomen and pelvis was performed both before and after the administration of intravenous contrast. CONTRAST:  42mL GADAVIST GADOBUTROL 1 MMOL/ML IV SOLN COMPARISON:  CT abdomen/pelvis dated 09/21/2019 FINDINGS: Lower chest: Lung bases are clear. Hepatobiliary: Liver is within normal limits. Gallbladder is unremarkable. No intrahepatic or extrahepatic ductal dilatation. Pancreas:  Within normal limits. Spleen:  Within normal limits. Adrenals/Urinary Tract:  Adrenal glands are within normal limits. Kidneys are within normal limits.  No hydronephrosis. Bladder is within normal limits. Stomach/Bowel: Stomach is within normal limits. No small bowel wall thickening or inflammatory changes. Suspected postsurgical changes related to ventral hernia mesh repair (series 6/image 33). Small fat containing periumbilical hernia (series 6/image 45). Prior fluid on recent CT has essentially resolved. Two  small bowel loops are tethered just beneath the umbilical hernia (series 6/images 42 and 43), without associated small bowel obstruction. Left colonic diverticulosis, without evidence of diverticulitis. Vascular/Lymphatic:  No evidence of aneurysm. No suspicious abdominal lymphadenopathy. Reproductive: Prostate is unremarkable. Other:  No abdominopelvic ascites. Mild fat in the left inguinal canal (series 6/image 31). Musculoskeletal: No focal osseous lesions. IMPRESSION: No small bowel wall thickening or inflammatory changes. Left colonic diverticulosis, without evidence of diverticulitis. Suspected postsurgical changes related to ventral hernia mesh repair. Small fat containing periumbilical hernia. Prior fluid on recent CT has essentially resolved. Two small bowel loops are tethered just beneath the umbilical hernia, without associated small bowel obstruction. Mild fat in the left inguinal canal. Electronically Signed   By: Julian Hy M.D.   On: 10/24/2019 23:40    Assessment and Plan:   Cleburn Greiwe is a 59 y.o. y/o male here for follow-up of abdominal pain for 1 year, with MRE showing tethering of small bowel loops around umbilical hernia  Assessment and Plan: Suspect that his symptoms may be related to the findings on MRE.  He has never had any abdominal surgeries and therefore poor surgical changes reported on MRE are surprising  We have helped facilitate his appointment and he has an appointment with [place] surgery this week  No evidence of IBD on imaging, and recent colonoscopy.  Fecal calprotectin was borderline  Due to burning sensation after drinking water, I will increase his omeprazole to  twice daily for 30 days to see if it helps.    (Risks of PPI use were discussed with patient including bone loss, C. Diff diarrhea, pneumonia, infections, CKD, electrolyte abnormalities.  Pt. Verbalizes understanding and chooses to continue the medication.)  Chronically elevated  liver enzymes.  Complete work-up with autoimmune and viral hepatitis panel.  Finding of fatty liver on imaging discussed with patient Diet, weight loss, and exercise encouraged along with avoiding hepatotoxic drugs including alcohol Risk of progression to cirrhosis if above measures are not instituted were discussed as well, and patient verbalized understanding  Follow Up Instructions:    I discussed the assessment and treatment plan with the patient. The patient was provided an opportunity to ask questions and all were answered. The patient agreed with the plan and demonstrated an understanding of the instructions.   The patient was advised to call back or seek an in-person evaluation if the symptoms worsen or if the condition fails to improve as anticipated.  I provided 15 minutes of non-face-to-face time during this encounter. Additional time was spent in reviewing patient's chart, placing orders etc.   Virgel Manifold, MD  Speech recognition software was used to dictate this note.

## 2019-11-04 ENCOUNTER — Other Ambulatory Visit (HOSPITAL_COMMUNITY): Payer: Self-pay | Admitting: Surgery

## 2019-11-08 ENCOUNTER — Encounter: Payer: Self-pay | Admitting: Family Medicine

## 2019-11-08 ENCOUNTER — Other Ambulatory Visit: Payer: Self-pay

## 2019-11-08 ENCOUNTER — Ambulatory Visit (INDEPENDENT_AMBULATORY_CARE_PROVIDER_SITE_OTHER): Payer: Commercial Managed Care - PPO | Admitting: Family Medicine

## 2019-11-08 VITALS — BP 130/80 | HR 78 | Ht 72.0 in | Wt 211.0 lb

## 2019-11-08 DIAGNOSIS — M1380 Other specified arthritis, unspecified site: Secondary | ICD-10-CM

## 2019-11-08 NOTE — Progress Notes (Signed)
   Rito Ehrlich, am serving as a Education administrator for Dr. Lynne Leader.  Jake Liu is a 59 y.o. male who presents to [place] [facility] today for R hand pain.  Pt was last seen by Dr. Georgina Snell on 09/19/19 for f/u of B shoulder pain and L knee pain and had B GHJ injections.  He is being followed by several specialties including reheumatology, gastroenterology and cardiology.  Since his last visit, pt reports R hand was swollen yesterday had noticed the swelling and pain X3 days ago. He rates the pain today a 1/10 yesterday a 6/10 shooting pain. The pain would radiate from top of hand to wrist. Patient states that his hand, foot and knee where swollen yesterday states that his prednisone was upped and the swelling and pain had went down.  He is currently seeing a rheumatologist Dr Annalee Genta at [facility].  He is currently being treated for seronegative rheumatoid arthritis.  He responded extremely well to prednisone as noted above.  Recently he had a taper of prednisone with plan to transition to methotrexate.  He notes that he started having hand and knee and wrist swelling and yesterday was increased back up to 20 mg of prednisone and is feeling a lot better now.  Jake Liu, [place]  [place], [place] [address]  [phone]  [phone] (Fax)   Pertinent review of systems: No fevers or chills  Relevant historical information: Probable seronegative rheumatoid arthritis   Exam:  BP 130/80 (BP Location: Left Arm, Patient Position: Sitting, Cuff Size: Large)   Pulse 78   Ht 6' (1.829 m)   Wt 211 lb (95.7 kg)   SpO2 96%   BMI 28.62 kg/m  General: Well Developed, well nourished, and in no acute distress.   MSK: Right hand and wrist no significant effusion or swelling today.  Normal motion. Left knee no significant effusion normal motion.      Assessment and Plan: 59 y.o. male with polyarthralgia and  effusions.  Very responsive to oral steroids.  Currently in the process of being evaluated and treated for what looks like seronegative rheumatoid arthritis.  His care has been quite complicated but overall I had say he is doing much better recently.  I agree with rheumatology approach to slowly titrate away from prednisone and onto disease modifying antirheumatics.  Lengthy discussion with the Ioannis regarding basic philosophy of transitioning from prednisone onto medications like methotrexate.  Discussed that his rheumatologist is likely to keep him at 20 for a bit and then slowly taper down eventually off of prednisone over period of months while adjusting doses of medications like methotrexate to achieve good symptom control.  Plan to be here for [place] if he has questions or acute needs.  However I believe the majority of his care will now be handled at his rheumatologist office for this issue.  CC: Rheum and PCP  Discussed warning signs or symptoms. Please see discharge instructions. Patient expresses understanding.   The above documentation has been reviewed and is accurate and complete Lynne Leader   Total encounter time 30 minutes including charting time date of service. Time spent day of service with chart review, discussion with patient regarding as above and documentation.

## 2019-11-08 NOTE — Patient Instructions (Addendum)
Thank you for coming in today.  I think your rheumatologist is doing the correct thing.  The plan is to slowly reduce prednisone and start medicine like Methotrexate (disease modifying anti-rheumatics).  The trick is to figure out how fast to reduce prednisone and the correct dose of the right medicine to replace it.    Rheumatoid Arthritis Rheumatoid arthritis (RA) is a long-term (chronic) disease. RA causes inflammation in your joints. Your joints may feel painful, stiff, swollen, and warm. RA may start slowly. It most often affects the small joints of the hands and feet. It can also affect other parts of the body. Symptoms of RA often come and go. There is no cure for RA, but medicines can help your symptoms. What are the causes?  RA is an autoimmune disease. This means that your body's defense system (immune system) attacks healthy parts of your body by mistake. The exact cause of RA is not known. What increases the risk?  Being a woman.  Having a family history of RA or other diseases like RA.  Smoking.  Being overweight.  Being exposed to pollutants or chemicals. What are the signs or symptoms?  Morning stiffness that lasts longer than 30 minutes. This is often the first symptom.  Symptoms start slowly. They are often worse in the morning.  As RA gets worse, symptoms may include: ? Pain, stiffness, swelling, warmth, and tenderness in joints on both sides of your body. ? Loss of energy. ? Not feeling hungry. ? Weight loss. ? A low fever. ? Dry eyes and a dry mouth. ? Firm lumps that grow under your skin. ? Changes in the way your joints look. ? Changes in the way your joints work.  Symptoms vary and they: ? Often come and go. ? Sometimes get worse for a period of time. These are called flares. How is this treated?   Treatment may include: ? Taking good care of yourself. Be sure to rest as needed, eat a healthy diet, and exercise. ? Medicines. These may  include:  Pain relievers.  Medicines to help with inflammation.  Disease-modifying antirheumatic drugs (DMARDs).  Medicines called biologic response modifiers. ? Physical therapy and occupational therapy. ? Surgery, if joint damage is very bad. Your doctor will work with you to find the best treatments. Follow these instructions at home: Activity  Return to your normal activities as told by your doctor. Ask your doctor what activities are safe for you.  Rest when you have a flare.  Exercise as told by your doctor. General instructions  Take over-the-counter and prescription medicines only as told by your doctor.  Keep all follow-up visits as told by your doctor. This is important. Where to find more information  [employer] of Rheumatology: www.rheumatology.[place]: www.arthritis.org Contact a doctor if:  You have a flare.  You have a fever.  You have problems because of your medicines. Get help right away if:  You have chest pain.  You have trouble breathing.  You get a hot, painful joint all of a sudden, and it is worse than your normal joint aches. Summary  RA is a long-term disease.  Symptoms of RA start slowly. They are often worse in the morning.  RA causes inflammation in your joints. This information is not intended to replace advice given to you by your health care provider. Make sure you discuss any questions you have with your health care provider. Document Revised: 03/17/2018 Document Reviewed: 03/17/2018 Elsevier Patient Education  2020 Elsevier Inc.  

## 2019-11-09 ENCOUNTER — Telehealth: Payer: Self-pay | Admitting: Cardiovascular Disease

## 2019-11-09 NOTE — Telephone Encounter (Signed)
   [facility] Pre-operative Risk Assessment    Request for surgical clearance:  1. What type of surgery is being performed? Lap umbilical hernia repair  2. When is this surgery scheduled? TBD  3. What type of clearance is required (medical clearance vs. Pharmacy clearance to hold med vs. Both)? both  4. Are there any medications that need to be held prior to surgery and how long? Stop Xarelto 5 days prior to surgery   5. Practice name and name of physician performing surgery? [facility], Dr. [name], [facility]  6. What is your office phone number [phone]   8.   What is your office fax number [phone] ATTN: [name], CMA  8.   Anesthesia type (None, local, MAC, general) ? general   Jake Liu 11/09/2019, 4:07 PM  _________________________________________________________________   (provider comments below)

## 2019-11-09 NOTE — Progress Notes (Signed)
Office Visit    Patient Name: Jake Liu Date of Encounter: 11/12/2019  Primary Care Provider:  Burnard Hawthorne, FNP Primary Cardiologist:  Ida Rogue, MD Electrophysiologist:  Marzetta Board, MD   Chief Complaint    Jake Liu is a 59 y.o. male with a hx of atrial fibrillation s/p ablation (2017) on anticoagulation, HTN, TIA, OSA, coronary calcium on CT presents today for cardiac clearance for surgery.   Past Medical History    Past Medical History:  Diagnosis Date  . Anginal pain ([place])   . Arthritis   . Atrial fibrillation ([place])   . Cardiomyopathy ([place])    EF: 45%  . CHF (congestive heart failure) ([place])   . GERD (gastroesophageal reflux disease)   . History of TIAs   . Hypertension   . Insomnia   . Sciatica   . Seronegative arthritis 09/27/2019  . Sleep apnea    Past Surgical History:  Procedure Laterality Date  . ABLATION N/A    for af  . COLONOSCOPY    . COLONOSCOPY WITH PROPOFOL N/A 10/13/2018   Procedure: COLONOSCOPY WITH PROPOFOL;  Surgeon: Toledo, [name], MD;  Location: [facility] ENDOSCOPY;  Service: Gastroenterology;  Laterality: N/A;  . ESOPHAGOGASTRODUODENOSCOPY    . ESOPHAGOGASTRODUODENOSCOPY (EGD) WITH PROPOFOL N/A 10/13/2018   Procedure: ESOPHAGOGASTRODUODENOSCOPY (EGD) WITH PROPOFOL;  Surgeon: Toledo, [name], MD;  Location: [facility] ENDOSCOPY;  Service: Gastroenterology;  Laterality: N/A;  . HERNIA REPAIR    . KNEE SURGERY    . ROTATOR CUFF REPAIR      Allergies  Allergies  Allergen Reactions  . Sulfa Antibiotics Hives    rash   . Sulfasalazine Hives  . Bactrim [Sulfamethoxazole-Trimethoprim]     Sulfa/Rash  . Lisinopril     cough    History of Present Illness    [name] is a 59 y.o. male with a hx of  atrial fibrillation s/p ablation (2017) on anticoagulation, HTN, TIA, OSA, coronary calcium on CT. He was last seen 04/2018 by Dr. [name]. He follows routinely with electrophysiologist Dr. [name] of [place].  Labs via [facility]  Everywhere 09/01/19:  Hb 13, K 4.2, Creatinine 0.6, GFR 138  ATrial fibrillation with DCCV June 2013, July 2014, ablation 2017 at [facility], and repeat DCCV earlier this year.   Previous cardiac studies include:  2013 Exercise myocardial perfusion imaging study with no significant ischemia. Normal EF 59%. No focal wall motion abnormalities. No EKG changes. Low risk study.  09/2017 echo with normal LV function, mild AI/MR/TR per report.   08/31/18 Calcium score 1228 placing him in 98th percentile per age/sex.   Chest CT 08/24/19 with notation of heart "normal in size and without evidence of right heart strain. No pericardial effusion. Severe three-vessel coronary artery atherosclerotic calcifications"  Has been having health difficulties since December. Had his knee drained 4 times per his report. Had recurrent atrial fibrillation requiring cardioversion. Undergoing treatment for rheumatoid arthritis including prednisone and injections.   Tells me he has had abdominal hernia for a long time. Has been undergoing pain in stomach for two years. Went for endoscopy right before March 2020 with notation of "pockets in the stomach". Tells me now on recent studies his "bowels are tied on his hernia".   Tells me he is down 30 pounds. He attributes this to stopping drinking after December. Previously drank 1.5 bottles of wine per night and he then had lots of snacking.   BP routinely 130-140/80 when checked at home. Reports no SOB  nor dyspnea on exertion. No chest pain, pressure, tightness. No palpitations, orthopnea, edema.   Still working in [employer], therapeutic. Endorses his job is very stressful. Exercises by playing golf. He also push mows his one acre lawn without difficulty. Tells me he hasn't smoked in 1.5 years.   EKGs/Labs/Other Studies Reviewed:   The following studies were reviewed today:  EKG:  EKG is ordered today.  The ekg ordered today demonstrates SR 92 bpm with single PVC and no acute ST/T  wave changes. Noted minimal voltage criteria for LVH, likely normal variant.   Recent Labs: 08/06/2019: B Natriuretic Peptide 398.0; TSH 1.986 08/27/2019: Magnesium 1.7 09/16/2019: ALT 48; BUN 11; Creatinine, Ser 0.58; Hemoglobin 11.8; Platelets 402; Potassium 4.0; Sodium 134  Recent Lipid Panel    Component Value Date/Time   CHOL 237 (H) 08/13/2018 1501   TRIG 45 08/13/2018 1501   HDL 68 08/13/2018 1501   CHOLHDL 3.5 08/13/2018 1501   VLDL 16.4 01/05/2015 0840   LDLCALC 155 (H) 08/13/2018 1501    Home Medications   Current Meds  Medication Sig  . amLODipine (NORVASC) 5 MG tablet TAKE 1 TABLET(5 MG) BY MOUTH DAILY  . metoprolol succinate (TOPROL-XL) 50 MG 24 hr tablet Take 1 tablet (50 mg total) by mouth daily. Take with or immediately following a meal.  . omeprazole (PRILOSEC) 40 MG capsule Take 1 capsule (40 mg total) by mouth 2 (two) times daily.  . predniSONE (DELTASONE) 20 MG tablet Take 2 tablets by mouth daily. Take two tablets daily for 30 days.  . rivaroxaban (XARELTO) 20 MG TABS tablet Take by mouth.      Review of Systems       Review of Systems  Constitution: Negative for chills, fever and malaise/fatigue.  Cardiovascular: Negative for chest pain, dyspnea on exertion, irregular heartbeat, leg swelling, near-syncope, orthopnea, palpitations and syncope.  Respiratory: Negative for cough, shortness of breath and wheezing.   Gastrointestinal: Positive for abdominal pain. Negative for melena, nausea and vomiting.  Genitourinary: Negative for hematuria.  Neurological: Negative for dizziness, light-headedness and weakness.   All other systems reviewed and are otherwise negative except as noted above.  Physical Exam    VS:  BP (!) 142/86 (BP Location: Left Arm, Patient Position: Sitting, Cuff Size: Normal)   Pulse 92   Ht 6' (1.829 m)   Wt 211 lb (95.7 kg)   SpO2 98%   BMI 28.62 kg/m  , BMI Body mass index is 28.62 kg/m. GEN: Well nourished, well developed, in no  acute distress. HEENT: normal. Neck: Supple, no JVD, carotid bruits, or masses. Cardiac: RRR, no murmurs, rubs, or gallops. No clubbing, cyanosis, edema.  Radials/DP/PT 2+ and equal bilaterally.  Respiratory:  Respirations regular and unlabored, clear to auscultation bilaterally. GI: Soft, nontender, nondistended, BS + x 4. MS: No deformity or atrophy. Skin: Warm and dry, no rash. Neuro:  Strength and sensation are intact. Psych: Normal affect.  Assessment & Plan    1. Preoperative cardiovascular exam - Upcoming laparoscopic umbilical hernia repair. Cardiac history notable for coronary artery calcification on CT and paroxysmal atrial fibrillation. Requesting office asks to hold Xarelto x7 days. His creatinine clearance is >80. Appropriate to hold Xarelto for 2 days. As EP Dr. [name] who prescribes his Xarelto told him he could hold for 3 days, will defer to that decision. According to the Revised Cardiac Risk Index (RCRI), his Perioperative Risk of Major Cardiac Event is (%): 0.9. His  Functional Capacity in METs is: 8.97  according to the [facility] Activity Status Index (DASI). EKG today shows NSR single PVC and no acute ST/T wave changes. As he is without anginal symptoms, no indication for additional cardiovascular testing prior. He is deemed acceptable cardiovascular risk to proceed with upcoming procedure. He may hold his Xarelto 3 days prior to planned procedure. Will route this note to [facility] via [place] fax function.   2. Coronary artery calcification on CT/CAD - EKG today no acute ST/T wave changes. Reports no anginal symptoms. Calcium score 08/2018 of 1228 placing him in 98th percentile for age/sex. CT chest 07/2019 with noted severe three-vessel coronary artery atherosclerosis. Would recommend low threshold for ischemic evaluation for anginal symptoms.  GDMT: No aspirin secondary to chronic anticoagulation. Continue beta blocker. His rheumatologist previously discontinued statin, will  consider repeat trial in the future. Start Zetia today, as below.   3. Paroxysmal atrial fibrillation - EKG today in NSR. Continue Toprol 50mg  daily per EP Dr. [name].   4. Chronic anticoagulation - Secondary to PAF and CHADS2VASC of at least 2 (HTN, CAD). Denies bleeding complications. Continue Xarelto 20mg  daily per Dr. [name] of EP at [facility].   5. HTN - Blood pressure elevated today. Abdominal pain and/or prednisone may be contributory. He is hesitant regarding medication changes prior to surgery. Discussed BP goal <130/80. Continue Toprol 50mg  daily and Amlodipine 5mg  daily. Asked him to check routinely at home and report BP consistently <130/80. If so, will increase Amlodipine to 10mg  daily.  6. HLD, LDL goal <70 - Rheumatology stopped statin. Consider trial again in future due to elevated Ca score. Start Zetia 10mg  daily. Lipid panel and liver function test 6 weeks after starting. May require referral to lipid clinic.   7. Arthritis - Following with rheumatology.   Disposition: Follow up in 4 month(s) with Dr. [name] or APP.    Loel Dubonnet, NP 11/12/2019, 1:07 PM

## 2019-11-09 NOTE — Telephone Encounter (Addendum)
   Primary Cardiologist: Marzetta Board, MD  Chart reviewed as part of pre-operative protocol coverage.   Patient has not been seen in our office since 2019. More recently has followed with Dr. [name] with [place]. Also with appointment to see Laurann Montana, NP 11/11/19 for preop clearance.   Callback: - Can you clarify who this patient follows with for cardiology so we are not overstepping. Thanks!  Abigail Butts, PA-C 11/09/2019, 4:53 PM

## 2019-11-10 NOTE — Telephone Encounter (Signed)
I s/w the pt this morning in regards to who he is being followed by for cardiology. Pt states he has been followed by [facility]. Pt states that [facility] said he needs to follow back with our office. Pt has appt tomorrow with Laurann Montana, NP 11/11/19 for pre op clearance. Pt asked could he please keep his appt with [place]. I assured the pt that is fine, we are happy to see him. Pt thanked me for the call today and the help. Pt states he does not know why [facility] is wanting him to follow back up with our office though he is very much appreciative of the appt and our help.   I will send clearance notes to NP for upcoming appt tomorrow 11/11/19. I will remove the pre op call back pool.

## 2019-11-11 ENCOUNTER — Encounter: Payer: Self-pay | Admitting: Family

## 2019-11-11 ENCOUNTER — Other Ambulatory Visit: Payer: Self-pay

## 2019-11-11 ENCOUNTER — Ambulatory Visit (INDEPENDENT_AMBULATORY_CARE_PROVIDER_SITE_OTHER): Payer: Commercial Managed Care - PPO | Admitting: Family

## 2019-11-11 VITALS — BP 142/86 | HR 92 | Ht 72.0 in | Wt 211.0 lb

## 2019-11-11 DIAGNOSIS — I480 Paroxysmal atrial fibrillation: Secondary | ICD-10-CM | POA: Diagnosis not present

## 2019-11-11 DIAGNOSIS — Z7901 Long term (current) use of anticoagulants: Secondary | ICD-10-CM | POA: Diagnosis not present

## 2019-11-11 DIAGNOSIS — I2510 Atherosclerotic heart disease of native coronary artery without angina pectoris: Secondary | ICD-10-CM | POA: Diagnosis not present

## 2019-11-11 DIAGNOSIS — E785 Hyperlipidemia, unspecified: Secondary | ICD-10-CM

## 2019-11-11 DIAGNOSIS — Z01810 Encounter for preprocedural cardiovascular examination: Secondary | ICD-10-CM

## 2019-11-11 MED ORDER — EZETIMIBE 10 MG PO TABS: 10.0000 mg | ORAL_TABLET | Freq: Every day | ORAL | 1 refills | Status: DC

## 2019-11-11 NOTE — Patient Instructions (Addendum)
Medication Instructions:  Your physician has recommended you make the following change in your medication:   START Zetia 10mg  daily. You may wait until after surgery to stop this, if you wish.  May hold Xarelto 3 days prior to surgery per Dr. [name].    *If you need a refill on your cardiac medications before your next appointment, please call your pharmacy*   Lab Work: Your physician recommends that you return for lab work 6-8 weeks after starting Zetia for fasting lipid panel/liver function.  Please come to the [facility] to have these drawn.  If you have labs (blood work) drawn today and your tests are completely normal, you will receive your results only by: Marland Kitchen MyChart Message (if you have MyChart) OR . A paper copy in the mail If you have any lab test that is abnormal or we need to change your treatment, we will call you to review the results.   Testing/Procedures: Your EKG today showed normal sinus rhythm.   Follow-Up: At [facility], you and your health needs are our priority.  As part of our continuing mission to provide you with exceptional heart care, we have created designated Provider Care Teams.  These Care Teams include your primary Cardiologist (physician) and Advanced Practice Providers (APPs -  Physician Assistants and Nurse Practitioners) who all work together to provide you with the care you need, when you need it.  We recommend signing up for the patient portal called "MyChart".  Sign up information is provided on this After Visit Summary.  MyChart is used to connect with patients for Virtual Visits (Telemedicine).  Patients are able to view lab/test results, encounter notes, upcoming appointments, etc.  Non-urgent messages can be sent to your provider as well.   To learn more about what you can do with MyChart, go to [email or web address].    Your next appointment:  Follow up in 4 months with Dr. [name] or APP.   Check blood pressure 3 times per week and  call office with report in 2 weeks. Our goal is BP less than 130/80. If your blood pressure is consistently more than 130/80, recommend increasing your Amlodipine to 10mg . Call us to let us know your blood pressure and we can send in prescription if needed.   Goal if for your LDL or "lousy cholesterol" to be less than 70. This significant reduces your risks associated with cardiovascular disease. From previous CT scans we know you have plaque on your coronary arteries, we want to be sure this does not progress to a symptomatic blockage. We will start Zetia to start moving towards this goal.    Call the office to report any chest pain, pressure, tightness or new shortness of breath with activity.    We will route message to [facility] regarding your cardiac clearance.   Ezetimibe Tablets What is this medicine? EZETIMIBE (ez ET i mibe) blocks the absorption of cholesterol from the stomach. It can help lower blood cholesterol for patients who are at risk of getting heart disease or a stroke. It is only for patients whose cholesterol level is not controlled by diet. This medicine may be used for other purposes; ask your health care provider or pharmacist if you have questions. COMMON BRAND NAME(S): Zetia What should I tell my health care provider before I take this medicine? They need to know if you have any of these conditions:  liver disease  an unusual or allergic reaction to ezetimibe, medicines, foods, dyes, or preservatives  pregnant or trying to get pregnant  breast-feeding How should I use this medicine? Take this medicine by mouth with a glass of water. Follow the directions on the prescription label. This medicine can be taken with or without food. Take your doses at regular intervals. Do not take your medicine more often than directed. Talk to your pediatrician regarding the use of this medicine in children. Special care may be needed. Overdosage: If you think you have taken  too much of this medicine contact a poison control center or emergency room at once. NOTE: This medicine is only for you. Do not share this medicine with others. What if I miss a dose? If you miss a dose, take it as soon as you can. If it is almost time for your next dose, take only that dose. Do not take double or extra doses. What may interact with this medicine? Do not take this medicine with any of the following medications:  fenofibrate  gemfibrozil This medicine may also interact with the following medications:  antacids  cyclosporine  herbal medicines like red yeast rice  other medicines to lower cholesterol or triglycerides This list may not describe all possible interactions. Give your health care provider a list of all the medicines, herbs, non-prescription drugs, or dietary supplements you use. Also tell them if you smoke, drink alcohol, or use illegal drugs. Some items may interact with your medicine. What should I watch for while using this medicine? Visit your doctor or health care professional for regular checks on your progress. You will need to have your cholesterol levels checked. If you are also taking some other cholesterol medicines, you will also need to have tests to make sure your liver is working properly. Tell your doctor or health care professional if you get any unexplained muscle pain, tenderness, or weakness, especially if you also have a fever and tiredness. You need to follow a low-cholesterol, low-fat diet while you are taking this medicine. This will decrease your risk of getting heart and blood vessel disease. Exercising and avoiding alcohol and smoking can also help. Ask your doctor or dietician for advice. What side effects may I notice from receiving this medicine? Side effects that you should report to your doctor or health care professional as soon as possible:  allergic reactions like skin rash, itching or hives, swelling of the face, lips, or  tongue  dark yellow or brown urine  unusually weak or tired  yellowing of the skin or eyes Side effects that usually do not require medical attention (report to your doctor or health care professional if they continue or are bothersome):  diarrhea  dizziness  headache  stomach upset or pain This list may not describe all possible side effects. Call your doctor for medical advice about side effects. You may report side effects to FDA at 1-800-FDA-1088. Where should I keep my medicine? Keep out of the reach of children. Store at room temperature between 15 and 30 degrees C (59 and 86 degrees F). Protect from moisture. Keep container tightly closed. Throw away any unused medicine after the expiration date. NOTE: This sheet is a summary. It may not cover all possible information. If you have questions about this medicine, talk to your doctor, pharmacist, or health care provider.  2020 Elsevier/Gold Standard (2012-01-19 15:39:09)

## 2019-11-28 ENCOUNTER — Other Ambulatory Visit
Admission: RE | Admit: 2019-11-28 | Discharge: 2019-11-28 | Disposition: A | Discharge status: Home / Self Care | Payer: Commercial Managed Care - PPO | Source: Ambulatory Visit | Attending: Gastroenterology | Admitting: Gastroenterology

## 2019-11-28 ENCOUNTER — Other Ambulatory Visit
Admission: RE | Admit: 2019-11-28 | Discharge: 2019-11-28 | Disposition: A | Discharge status: Home / Self Care | Payer: Commercial Managed Care - PPO | Source: Ambulatory Visit | Attending: Internal Medicine | Admitting: Internal Medicine

## 2019-11-28 DIAGNOSIS — M791 Myalgia, unspecified site: Secondary | ICD-10-CM | POA: Insufficient documentation

## 2019-11-28 DIAGNOSIS — E782 Mixed hyperlipidemia: Secondary | ICD-10-CM | POA: Insufficient documentation

## 2019-11-28 LAB — CBC WITH DIFFERENTIAL/PLATELET
Abs Immature Granulocytes: 0.05 10*3/uL (ref 0.00–0.07)
Basophils Absolute: 0 10*3/uL (ref 0.0–0.1)
Basophils Relative: 0 %
Eosinophils Absolute: 0 10*3/uL (ref 0.0–0.5)
Eosinophils Relative: 0 %
HCT: 44.5 % (ref 39.0–52.0)
Hemoglobin: 14.4 g/dL (ref 13.0–17.0)
Immature Granulocytes: 0 %
Lymphocytes Relative: 15 %
Lymphs Abs: 1.7 10*3/uL (ref 0.7–4.0)
MCH: 27.9 pg (ref 26.0–34.0)
MCHC: 32.4 g/dL (ref 30.0–36.0)
MCV: 86.1 fL (ref 80.0–100.0)
Monocytes Absolute: 0.8 10*3/uL (ref 0.1–1.0)
Monocytes Relative: 7 %
Neutro Abs: 8.6 10*3/uL — ABNORMAL HIGH (ref 1.7–7.7)
Neutrophils Relative %: 78 %
Platelets: 387 10*3/uL (ref 150–400)
RBC: 5.17 MIL/uL (ref 4.22–5.81)
RDW: 15.9 % — ABNORMAL HIGH (ref 11.5–15.5)
WBC: 11.1 10*3/uL — ABNORMAL HIGH (ref 4.0–10.5)
nRBC: 0 % (ref 0.0–0.2)

## 2019-11-28 LAB — IRON AND TIBC
Iron: 53 ug/dL (ref 45–182)
Saturation Ratios: 16 % — ABNORMAL LOW (ref 17.9–39.5)
TIBC: 330 ug/dL (ref 250–450)
UIBC: 277 ug/dL

## 2019-11-28 LAB — SEDIMENTATION RATE: Sed Rate: 6 mm/hr (ref 0–20)

## 2019-11-28 LAB — COMPREHENSIVE METABOLIC PANEL
ALT: 18 U/L (ref 0–44)
AST: 17 U/L (ref 15–41)
Albumin: 4 g/dL (ref 3.5–5.0)
Alkaline Phosphatase: 65 U/L (ref 38–126)
Anion gap: 10 (ref 5–15)
BUN: 18 mg/dL (ref 6–20)
CO2: 26 mmol/L (ref 22–32)
Calcium: 9.4 mg/dL (ref 8.9–10.3)
Chloride: 102 mmol/L (ref 98–111)
Creatinine, Ser: 0.91 mg/dL (ref 0.61–1.24)
GFR calc Af Amer: >60 mL/min (ref >60)
GFR calc non Af Amer: >60 mL/min (ref >60)
Glucose, Bld: 110 mg/dL — ABNORMAL HIGH (ref 70–99)
Potassium: 4.8 mmol/L (ref 3.5–5.1)
Sodium: 138 mmol/L (ref 135–145)
Total Bilirubin: 0.8 mg/dL (ref 0.3–1.2)
Total Protein: 7.2 g/dL (ref 6.5–8.1)

## 2019-11-28 LAB — AST: AST: 17 U/L (ref 15–41)

## 2019-11-28 LAB — LIPID PANEL
Cholesterol: 255 mg/dL — ABNORMAL HIGH (ref 0–200)
HDL: 70 mg/dL (ref >40)
LDL Cholesterol: 169 mg/dL — ABNORMAL HIGH (ref 0–99)
Total CHOL/HDL Ratio: 3.6 RATIO
Triglycerides: 81 mg/dL (ref <150)
VLDL: 16 mg/dL (ref 0–40)

## 2019-11-28 LAB — CREATININE, SERUM
Creatinine, Ser: 0.98 mg/dL (ref 0.61–1.24)
GFR calc Af Amer: >60 mL/min (ref >60)
GFR calc non Af Amer: >60 mL/min (ref >60)

## 2019-11-28 LAB — HEPATITIS B SURFACE ANTIGEN: Hepatitis B Surface Ag: NONREACTIVE

## 2019-11-28 LAB — FERRITIN: Ferritin: 227 ng/mL (ref 24–336)

## 2019-11-28 LAB — ALT: ALT: 19 U/L (ref 0–44)

## 2019-11-28 LAB — C-REACTIVE PROTEIN: CRP: 0.6 mg/dL (ref <1.0)

## 2019-11-29 ENCOUNTER — Telehealth: Payer: Self-pay

## 2019-11-29 LAB — HEPATITIS A ANTIBODY, TOTAL: hep A Total Ab: NONREACTIVE

## 2019-11-29 LAB — HEPATITIS B CORE ANTIBODY, TOTAL: Hep B Core Total Ab: NONREACTIVE

## 2019-11-29 LAB — HEPATITIS C ANTIBODY: HCV Ab: NONREACTIVE

## 2019-11-29 NOTE — Telephone Encounter (Signed)
I called patient to go over labs. I was unable to leave message do to VM being full.

## 2019-11-29 NOTE — Patient Instructions (Signed)
DUE TO COVID-19 ONLY ONE VISITOR ARE ALLOWED TO COME WITH YOU AND STAY IN THE WAITING ROOM ONLY DURING PRE OP AND PROCEDURE. THEN TWO VISITORS MAY VISIT WITH YOU IN YOUR PRIVATE ROOM DURING VISITING HOURS ONLY!!   COVID SWAB TESTING MUST BE COMPLETED ON: Thursday, Dec 01, 2019 at 9:31M   [facility] (Must self quarantine after testing. Follow instructions on handout.)       Your procedure is scheduled on: Monday, Dec 05, 2019   Report to [facility] Main  Entrance    Report to admitting at 12:15 PM   Call this number if you have problems the morning of surgery [phone]   Do not eat food:After Midnight.   May have liquids until 11:15 AM day of surgery   CLEAR LIQUID DIET  Foods Allowed                                                                     Foods Excluded  Water, Black Coffee and tea, regular and decaf                             liquids that you cannot  Plain Jell-O in any flavor  (No red)                                           see through such as: Fruit ices (not with fruit pulp)                                     milk, soups, orange juice  Iced Popsicles (No red)                                    All solid food Carbonated beverages, regular and diet                                    Apple juices Sports drinks like Gatorade (No red) Lightly seasoned clear broth or consume(fat free) Sugar, honey syrup  Sample Menu Breakfast                                Lunch                                     Supper Cranberry juice                    Beef broth                            Chicken broth Jell-O  Grape juice                           Apple juice Coffee or tea                        Jell-O                                      Popsicle                                                Coffee or tea                        Coffee or tea    Oral Hygiene is  also important to reduce your risk of infection.                                    Remember - BRUSH YOUR TEETH THE MORNING OF SURGERY WITH YOUR REGULAR TOOTHPASTE   Do NOT smoke after Midnight   Take these medicines the morning of surgery with A SIP OF WATER: Amlodipine, Ezetimibe, Metoprolol, Omeprazole, Prednisone                               You may not have any metal on your body including jewelry, and body piercings             Do not wear lotions, powders, perfumes/cologne, or deodorant                           Men may shave face and neck.   Do not bring valuables to the hospital. [place].   Contacts, dentures or bridgework may not be worn into surgery.   Bring small overnight bag day of surgery.   Special Instructions: Bring a copy of your healthcare power of attorney and living will documents         the day of surgery if you haven't scanned them in before.              Please read over the following fact sheets you were given: IF YOU HAVE QUESTIONS ABOUT YOUR PRE OP INSTRUCTIONS PLEASE CALL [phone]   [place] - Preparing for Surgery Before surgery, you can play an important role.  Because skin is not sterile, your skin needs to be as free of germs as possible.  You can reduce the number of germs on your skin by washing with CHG (chlorahexidine gluconate) soap before surgery.  CHG is an antiseptic cleaner which kills germs and bonds with the skin to continue killing germs even after washing. Please DO NOT use if you have an allergy to CHG or antibacterial soaps.  If your skin becomes reddened/irritated stop using the CHG and inform your nurse when you arrive at Short Stay. Do not shave (including legs and underarms) for at least 48 hours prior to  the first CHG shower.  You may shave your face/neck.  Please follow these instructions carefully:  1.  Shower with CHG Soap the night before surgery and the  morning of  surgery.  2.  If you choose to wash your hair, wash your hair first as usual with your normal  shampoo.  3.  After you shampoo, rinse your hair and body thoroughly to remove the shampoo.                             4.  Use CHG as you would any other liquid soap.  You can apply chg directly to the skin and wash.  Gently with a scrungie or clean washcloth.  5.  Apply the CHG Soap to your body ONLY FROM THE NECK DOWN.   Do   not use on face/ open                           Wound or open sores. Avoid contact with eyes, ears mouth and   genitals (private parts).                       Wash face,  Genitals (private parts) with your normal soap.             6.  Wash thoroughly, paying special attention to the area where your    surgery  will be performed.  7.  Thoroughly rinse your body with warm water from the neck down.  8.  DO NOT shower/wash with your normal soap after using and rinsing off the CHG Soap.                9.  Pat yourself dry with a clean towel.            10.  Wear clean pajamas.            11.  Place clean sheets on your bed the night of your first shower and do not  sleep with pets. Day of Surgery : Do not apply any lotions/deodorants the morning of surgery.  Please wear clean clothes to the hospital/surgery center.  FAILURE TO FOLLOW THESE INSTRUCTIONS MAY RESULT IN THE CANCELLATION OF YOUR SURGERY  PATIENT SIGNATURE_________________________________  NURSE SIGNATURE__________________________________  ________________________________________________________________________

## 2019-11-30 ENCOUNTER — Telehealth: Payer: Self-pay

## 2019-11-30 LAB — IGG 4: IgG, Subclass 4: 124 mg/dL — ABNORMAL HIGH (ref 2–96)

## 2019-11-30 LAB — HEPATITIS B SURFACE ANTIBODY, QUANTITATIVE: Hep B S AB Quant (Post): 13.3 m[IU]/mL (ref >9.9)

## 2019-11-30 LAB — TESTOSTERONE: Testosterone: 314 ng/dL (ref 264–916)

## 2019-11-30 LAB — ANA: Anti Nuclear Antibody (ANA): NEGATIVE

## 2019-11-30 LAB — CERULOPLASMIN: Ceruloplasmin: 28.2 mg/dL (ref 16.0–31.0)

## 2019-11-30 NOTE — Telephone Encounter (Signed)
-----   Message from Virgel Manifold, MD sent at 11/30/2019  2:50 PM EDT ----- Caryl Pina please let the patient know, his lab test showed mildly low iron saturation.  Also mildly at the elevated IgG4.  We will need to repeat these labs after his surgery to see if these have improved.  Patient should follow-up in clinic in 2 to 3 months.  Please schedule

## 2019-11-30 NOTE — Telephone Encounter (Signed)
Please call patient and informed of results

## 2019-12-01 ENCOUNTER — Encounter (HOSPITAL_COMMUNITY): Payer: Self-pay

## 2019-12-01 ENCOUNTER — Other Ambulatory Visit: Payer: Self-pay

## 2019-12-01 ENCOUNTER — Other Ambulatory Visit (HOSPITAL_COMMUNITY): Payer: Commercial Managed Care - PPO

## 2019-12-01 ENCOUNTER — Other Ambulatory Visit
Admission: RE | Admit: 2019-12-01 | Discharge: 2019-12-01 | Disposition: A | Discharge status: Home / Self Care | Payer: Commercial Managed Care - PPO | Source: Ambulatory Visit | Attending: Surgery | Admitting: Surgery

## 2019-12-01 ENCOUNTER — Encounter (HOSPITAL_COMMUNITY)
Admission: RE | Admit: 2019-12-01 | Discharge: 2019-12-01 | Disposition: A | Discharge status: Home / Self Care | Payer: Commercial Managed Care - PPO | Source: Ambulatory Visit | Attending: Surgery | Admitting: Surgery

## 2019-12-01 DIAGNOSIS — K429 Umbilical hernia without obstruction or gangrene: Secondary | ICD-10-CM | POA: Diagnosis not present

## 2019-12-01 DIAGNOSIS — Z01818 Encounter for other preprocedural examination: Secondary | ICD-10-CM | POA: Diagnosis not present

## 2019-12-01 DIAGNOSIS — Z01812 Encounter for preprocedural laboratory examination: Secondary | ICD-10-CM | POA: Diagnosis not present

## 2019-12-01 DIAGNOSIS — K219 Gastro-esophageal reflux disease without esophagitis: Secondary | ICD-10-CM | POA: Insufficient documentation

## 2019-12-01 DIAGNOSIS — I509 Heart failure, unspecified: Secondary | ICD-10-CM | POA: Diagnosis not present

## 2019-12-01 DIAGNOSIS — I4891 Unspecified atrial fibrillation: Secondary | ICD-10-CM | POA: Insufficient documentation

## 2019-12-01 DIAGNOSIS — Z8673 Personal history of transient ischemic attack (TIA), and cerebral infarction without residual deficits: Secondary | ICD-10-CM | POA: Insufficient documentation

## 2019-12-01 DIAGNOSIS — I429 Cardiomyopathy, unspecified: Secondary | ICD-10-CM | POA: Insufficient documentation

## 2019-12-01 DIAGNOSIS — I110 Hypertensive heart disease with heart failure: Secondary | ICD-10-CM | POA: Insufficient documentation

## 2019-12-01 DIAGNOSIS — Z79899 Other long term (current) drug therapy: Secondary | ICD-10-CM | POA: Insufficient documentation

## 2019-12-01 DIAGNOSIS — G4730 Sleep apnea, unspecified: Secondary | ICD-10-CM | POA: Insufficient documentation

## 2019-12-01 DIAGNOSIS — Z20822 Contact with and (suspected) exposure to covid-19: Secondary | ICD-10-CM | POA: Diagnosis not present

## 2019-12-01 DIAGNOSIS — Z7952 Long term (current) use of systemic steroids: Secondary | ICD-10-CM | POA: Diagnosis not present

## 2019-12-01 DIAGNOSIS — Z7901 Long term (current) use of anticoagulants: Secondary | ICD-10-CM | POA: Insufficient documentation

## 2019-12-01 HISTORY — DX: Fatty (change of) liver, not elsewhere classified: K76.0

## 2019-12-01 HISTORY — DX: Headache, unspecified: R51.9

## 2019-12-01 LAB — ANTI-MICROSOMAL ANTIBODY LIVER / KIDNEY: LKM1 Ab: 0.7 Units (ref 0.0–20.0)

## 2019-12-01 LAB — SARS CORONAVIRUS 2 (TAT 6-24 HRS): SARS Coronavirus 2: NEGATIVE

## 2019-12-01 NOTE — Telephone Encounter (Signed)
Called patient and his voicemail was full to leave him a message. I will try to call him later today.

## 2019-12-01 NOTE — Telephone Encounter (Signed)
-----   Message from Shelby Mattocks, [place] sent at 11/30/2019  2:55 PM EDT -----  ----- Message ----- From: Virgel Manifold, MD Sent: 11/30/2019   2:50 PM EDT To: Shelby Mattocks, CMA  Caryl Pina please let the patient know, his lab test showed mildly low iron saturation.  Also mildly at the elevated IgG4.  We will need to repeat these labs after his surgery to see if these have improved.  Patient should follow-up in clinic in 2 to 3 months.  Please schedule

## 2019-12-01 NOTE — Progress Notes (Signed)
PCP - A. Margaret NP last office visit 10/03/19 in epic Cardiologist - Dr. Johnny Bridge C. Walker NP last office visit and clearance 11/11/19 in epic EP: Dr. [name]. Thomas Chest x-ray - 08/27/19 in epic EKG - 11/11/19 in epic Stress Test - greater than 2 years ECHO - greater than 2 years Cardiac Cath - greater than 2 years  Sleep Study - Yes CPAP - Not ordered  Fasting Blood Sugar - N/A Checks Blood Sugar __N/A___ times a day  Blood Thinner Instructions: Xarelto last dose 12/01/19 Aspirin Instructions: N/A Last Dose:N/A  Anesthesia review: Afib, CHF  Patient denies shortness of breath, fever, cough and chest pain at PAT appointment   Patient verbalized understanding of instructions that were given to them at the PAT appointment. Patient was also instructed that they will need to review over the PAT instructions again at home before surgery.

## 2019-12-02 LAB — MITOCHONDRIAL ANTIBODIES: Mitochondrial M2 Ab, IgG: <20 Units (ref 0.0–20.0)

## 2019-12-02 LAB — ANTI-SMOOTH MUSCLE ANTIBODY, IGG: F-Actin IgG: 4 Units (ref 0–19)

## 2019-12-02 NOTE — Progress Notes (Signed)
Anesthesia Chart Review   Case: [id number] Date/Time: 12/05/19 1403   Procedure: LAPAROSCOPIC UMBILICAL HERNIA REPAIR (N/A )   Anesthesia type: General   Pre-op diagnosis: UMBILICAL HERNIA   Location: [facility] / [facility]S   Surgeons: Alphonsa Overall, MD      DISCUSSION:59 y.o. former smoker with h/o atrial fibrillation (on Xarelto), GERD, CHF, HTN, sleep apnea, umbilical hernia scheduled for above procedure 12/05/2019 with Dr. Alphonsa Overall.   Pt last seen by cardiology 11/11/2019.  Per OV note, "Upcoming laparoscopic umbilical hernia repair. Cardiac history notable for coronary artery calcification on CT and paroxysmal atrial fibrillation. Requesting office asks to hold Xarelto x7 days. His creatinine clearance is >80. Appropriate to hold Xarelto for 2 days. As EP Dr. [name] who prescribes his Xarelto told him he could hold for 3 days, will defer to that decision. According to the Revised Cardiac Risk Index (RCRI), his Perioperative Risk of Major Cardiac Event is (%): 0.9. His  Functional Capacity in METs is: 8.97 according to the [facility] Activity Status Index (DASI). EKG today shows NSR single PVC and no acute ST/T wave changes. As he is without anginal symptoms, no indication for additional cardiovascular testing prior. He is deemed acceptable cardiovascular risk to proceed with upcoming procedure. He may hold his Xarelto 3 days prior to planned procedure. Will route this note to [facility] via [place] fax function."  Anticipate pt can proceed with planned procedure barring acute status change.   VS: BP 126/77   Pulse (!) 50   Temp 36.6 C   Resp 16   Ht 6' (1.829 m)   Wt 94.3 kg   SpO2 98%   BMI 28.21 kg/m   PROVIDERS: Burnard Hawthorne, FNP is PCP   Ida Rogue, MD is Cardiologist  LABS: Labs reviewed: Acceptable for surgery. (all labs ordered are listed, but only abnormal results are displayed)  Labs Reviewed - No data to display   IMAGES:   EKG: 11/11/2019 Rate 92 bpm   Sinus rhythm with occassional premature ventricular complexes Minimal voltage criteria for LVH, may be normal variant   CV: Myoview 02/02/2012 Exercise myocardial perfusion imaging study with no significant ischemia.  Normal ejection fraction estimated at 59%.  No focal wall motion abnormalities noted.  No EKG changes concerning for ischemia.  Overall, this is a low risk scan.  Past Medical History:  Diagnosis Date  . Anginal pain ([place])   . Arthritis   . Atrial fibrillation ([place])   . Cardiomyopathy ([place])    EF: 45%  . CHF (congestive heart failure) ([place])   . Fatty liver    borderline  . GERD (gastroesophageal reflux disease)   . Headache   . History of TIAs   . Hypertension   . Insomnia   . Sciatica   . Seronegative arthritis 09/27/2019  . Sleep apnea    No CPAP    Past Surgical History:  Procedure Laterality Date  . ABLATION N/A    for af  . COLONOSCOPY    . COLONOSCOPY WITH PROPOFOL N/A 10/13/2018   Procedure: COLONOSCOPY WITH PROPOFOL;  Surgeon: Toledo, [name], MD;  Location: [facility] ENDOSCOPY;  Service: Gastroenterology;  Laterality: N/A;  . ESOPHAGOGASTRODUODENOSCOPY    . ESOPHAGOGASTRODUODENOSCOPY (EGD) WITH PROPOFOL N/A 10/13/2018   Procedure: ESOPHAGOGASTRODUODENOSCOPY (EGD) WITH PROPOFOL;  Surgeon: Toledo, [name], MD;  Location: [facility] ENDOSCOPY;  Service: Gastroenterology;  Laterality: N/A;  . HERNIA REPAIR    . KNEE SURGERY Left     MEDICATIONS: .  amLODipine (NORVASC) 5 MG tablet  . ezetimibe (ZETIA) 10 MG tablet  . folic acid (FOLVITE) 1 MG tablet  . methotrexate 2.5 MG tablet  . metoprolol succinate (TOPROL-XL) 50 MG 24 hr tablet  . omeprazole (PRILOSEC) 40 MG capsule  . predniSONE (DELTASONE) 20 MG tablet  . rivaroxaban (XARELTO) 20 MG TABS tablet   No current facility-administered medications for this encounter.    Maia Plan [facility] Pre-Surgical Testing [phone] 12/02/19  2:15 PM

## 2019-12-04 MED ORDER — BUPIVACAINE LIPOSOME 1.3 % IJ SUSP
20.0000 mL | Freq: Once | INTRAMUSCULAR | Status: DC
  Filled 2019-12-04: qty 20

## 2019-12-04 NOTE — H&P (Signed)
Jake Liu  Location: [facility] Surgery Patient #: [id number] DOB: 03/01/1961 Undefined / Language: Jake Liu / Race: White Male  History of Present Illness   The patient is a 59 year old male who presents with a complaint of hernia.  The PCP is Jake Paris, NP  The patient was referred by Dr. Vonda Liu. He comes by himself. He has a little bit of an odd personality.  He says that he has had stomach problems for 2 years. The discomfort occurs when he drinks or eats, and comes or goes. He says some of his abdominal pain radiates to his groin. He describes an exam where he said he had "pockets" in his stomach. I can find no radiologic evaluation as such. He had a colonoscopy and upper endoscopy by dr. Bjorn Liu - which was pretty much unremarkable. I cannot tell if any of these exams were done in relation to his abdominal pain. He says that throughout this abdominal pain, he has lost 30 pounds.  The last fall, it sounds like he developed polyarthritis - involving shoulders, hands, knees. He has been on Prednisone, which controls his symptoms. His evaluation started with rheumatology - Dr. Meda Liu [facility]). He is now on 10 mg Prednisone a day. Because of his continued abdominal pain, he has had some abdominal pain and was seen by Dr. Bonna Liu. Actually, Dr. Michele Liu evaluation has been entirely by virtual visits, I don't think that she has seen him in person. He underwent a CT scan of the abdomen on 09/21/2019 - 1. Focal loop of mid ileum with asymmetric wall thickening andsurrounding fluid and mild soft tissue stranding concerning for enteritis. Several adjacent small bowel loops appear to converge with the thickened loop of bowel which may reflect adhesions or postinflammatory scarring. Findings may be inflammatory or infectious in etiology. Cannot exclude inflammatory bowel disease. 2. Suspect partial small bowel  obstruction with transition point in the central abdomen, at the level of the thickened bowel loop. 3. No suspicious mass or adenopathy identified within the abdomen or pelvis. 4. Aortic Atherosclerosis (ICD10-I70.0). He underwent a MRE on 10/24/2019 1. No small bowel wall thickening or inflammatory changes. 2. Left colonic diverticulosis, without evidence of diverticulitis. 3. Suspected postsurgical changes related to ventral hernia mesh repair. 4. Small fat containing periumbilical hernia. Prior fluid on recent CT has essentially resolved. Two small bowel loops are tethered just beneath the umbilical hernia, without associated small bowel obstruction.  I discussed the indications and complications of hernia surgery with the patient. I discussed both the laparoscopic and open approach to hernia repair.. The potential risks of hernia surgery include, but are not limited to, bleeding, infection, open surgery, nerve injury, and recurrence of the hernia. I provided the patient literature about hernia surgery. We talked about the use of mesh in hernias and their risks. The risk of mesh include chronic infection, erosion to other structures, and chronic pain. His symptoms of his hernia are out of proportion to what I find on exam. How much of this is from his polyarthritis/inflammatory condition, I do not know. But I think is worth fixing the hernia - and see how his symptoms do.  Plan: 1. Cardiac clearance from Dr. [name]. Marcello Liu, [facility], 2. Stop xarelto 5 days before surgery, 3. Schedule laparoscopic umbilical hernia repair  Review of Systems as stated in this history (HPI) or in the review of systems. Otherwise all other 12 point ROS are negative  Past Medical History: 1. Joint issues - seeing rheumatology -  Jake Liu 2. Upper endo and colonoscopy in 2020 at [facility] 3. A Fib On xarelto He is seen at  [facility] - Dr. Mylinda Liu (was just seen on 11/01/2019) 4. HTN 5. Borderline sleep apnea - he is not being treated 6. Anticoagulated - xarelto 7. On prednisone  Social History: Unmarried. Has girlfriend - Jake Liu He has two daughters - 95 yo and 22 yo. He is very stressed by his job - he works for [employer]. 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  The patient's family history was non contributory.  DATA REVIEWED, COUNSELING AND COORDINATION OF CARE: I have personally seen and evaluated the patient, evaluated laboratory and imaging results, formulated the assessment and plan and placed orders. This requires moderate/high medical decision making. Total time spent with patient and charting: 60 mintues    Allergies (Jake Liu, CMA; 11/04/2019 3:49 PM) Bactrim *ANTI-INFECTIVE AGENTS - MISC.*   Social History (Jake Liu, Jake Liu; 11/04/2019 3:48 PM) Alcohol use  Moderate alcohol use.  Family History (Jake Liu, Jake Liu; 11/04/2019 3:48 PM) Arthritis  Mother.  Other Problems (Jake Liu, CMA; 11/04/2019 3:48 PM) No pertinent past medical history     Review of Systems (Jake Liu CMA; 11/04/2019 3:48 PM) HEENT Not Present- Earache, Hearing Loss, Hoarseness, Nose Bleed, Oral Ulcers, Ringing in the Ears, Seasonal Allergies, Sinus Pain, Sore Throat, Visual Disturbances, Wears glasses/contact lenses and Yellow Eyes. Cardiovascular Present- Swelling of Extremities. Not Present- Chest Pain, Difficulty Breathing Lying Down, Leg Cramps, Palpitations, Rapid Heart Rate and Shortness of Breath. Male Genitourinary Not Present- Blood in Urine, Change in Urinary Stream, Frequency, Impotence, Nocturia, Painful Urination, Urgency and Urine Leakage. Musculoskeletal Present- Joint Pain. Not Present- Back Pain, Joint Stiffness, Muscle Pain, Muscle Weakness and Swelling of Extremities. Psychiatric Not Present- Anxiety, Bipolar, Change in Sleep Pattern,  Depression, Fearful and Frequent crying. Hematology Not Present- Blood Thinners, Easy Bruising, Excessive bleeding, Gland problems, HIV and Persistent Infections.  Vitals (Jake Liu CMA; 11/04/2019 3:49 PM) 11/04/2019 3:49 PM Weight: 213.25 lb Height: 72in Body Surface Area: 2.19 m Body Mass Index: 28.92 kg/m  Temp.: 97.85F (Tympanic)  Pulse: 102 (Regular)  P.OX: 97% (Room air) BP: 140/80(Sitting, Left Arm, Standard)   Physical Exam  General: WN WM who is alert and generally healthy appearing. He is wearing a mask. He is a somewhat nervous personality. HEENT: Normal. Pupils equal.  Neck: Supple. No mass. No thyroid mass.  Lymph Nodes: No supraclavicular or cervical nodes.  Lungs: Clear to auscultation and symmetric breath sounds. Heart: RRR. No murmur or rub.  Abdomen: Soft. No mass. No tenderness. Normal bowel sounds. No abdominal scars. Diastasis recti He has a tender umbilical hernia - but the hernia is reducible.  I do not feel an inguinal hernia. Rectal: Not done.  Extremities: Good strength and ROM in upper and lower extremities. He is doing well with his joints and hands right now.  Neurologic: Grossly intact to motor and sensory function. Psychiatric: Has normal mood and affect. Behavior is normal.  Assessment & Plan  1.  UMBILICAL HERNIA (Q000111Q)  Plan:  1. Cardiac clearance from Dr. [name]. Charlann Lange ([facility] would not clear him)   Addendum Note(Anaissa Macfadden H. [name] MD; 11/19/2019 12:22 PM)   seen by Laurann Montana - for [place] - card clearance   2. Stop xarelto 5 days before surgery   3. Schedule laparoscopic umbilical hernia repair  2.  ANTICOAGULATED (Z79.01)  Impression: On xarelto 3.  ARTHRITIS (  M19.90)  Pain issues out of proportion to what I find on PE  seeing rheumatology - Jake Liu 4.  HISTORY OF ATRIAL FIBRILLATION (Z86.79)  He is seen at [facility] - Dr. Mylinda Liu (was just seen on  11/01/2019) 5. HTN 6. Borderline sleep apnea - he is not being treated 7. On prednisone  His PCP has increased his prednisone to 40 mg.  Alphonsa Overall, MD, [facility] Office phone:  [phone]

## 2019-12-05 ENCOUNTER — Other Ambulatory Visit: Payer: Self-pay

## 2019-12-05 ENCOUNTER — Ambulatory Visit (HOSPITAL_COMMUNITY): Payer: Commercial Managed Care - PPO | Admitting: Certified Registered"

## 2019-12-05 ENCOUNTER — Telehealth: Payer: Self-pay

## 2019-12-05 ENCOUNTER — Encounter (HOSPITAL_COMMUNITY): Payer: Self-pay | Admitting: Surgery

## 2019-12-05 ENCOUNTER — Observation Stay (HOSPITAL_COMMUNITY)
Admission: RE | Admit: 2019-12-05 | Discharge: 2019-12-06 | Disposition: A | Discharge status: Home / Self Care | Payer: Commercial Managed Care - PPO | Attending: Surgery | Admitting: Surgery

## 2019-12-05 ENCOUNTER — Encounter (HOSPITAL_COMMUNITY)
Admission: RE | Disposition: A | Discharge status: Home / Self Care | Payer: Self-pay | Source: Home / Self Care | Attending: Surgery

## 2019-12-05 ENCOUNTER — Ambulatory Visit (HOSPITAL_COMMUNITY): Payer: Commercial Managed Care - PPO | Admitting: Physician Assistant

## 2019-12-05 DIAGNOSIS — M1990 Unspecified osteoarthritis, unspecified site: Secondary | ICD-10-CM | POA: Diagnosis not present

## 2019-12-05 DIAGNOSIS — Z79899 Other long term (current) drug therapy: Secondary | ICD-10-CM | POA: Diagnosis not present

## 2019-12-05 DIAGNOSIS — I4891 Unspecified atrial fibrillation: Secondary | ICD-10-CM | POA: Insufficient documentation

## 2019-12-05 DIAGNOSIS — K420 Umbilical hernia with obstruction, without gangrene: Principal | ICD-10-CM | POA: Diagnosis present

## 2019-12-05 DIAGNOSIS — I480 Paroxysmal atrial fibrillation: Secondary | ICD-10-CM | POA: Diagnosis not present

## 2019-12-05 DIAGNOSIS — Z888 Allergy status to other drugs, medicaments and biological substances status: Secondary | ICD-10-CM | POA: Diagnosis not present

## 2019-12-05 DIAGNOSIS — Z882 Allergy status to sulfonamides status: Secondary | ICD-10-CM | POA: Diagnosis not present

## 2019-12-05 DIAGNOSIS — Z7901 Long term (current) use of anticoagulants: Secondary | ICD-10-CM | POA: Diagnosis not present

## 2019-12-05 DIAGNOSIS — G4730 Sleep apnea, unspecified: Secondary | ICD-10-CM | POA: Insufficient documentation

## 2019-12-05 DIAGNOSIS — I10 Essential (primary) hypertension: Secondary | ICD-10-CM | POA: Diagnosis not present

## 2019-12-05 DIAGNOSIS — Z881 Allergy status to other antibiotic agents status: Secondary | ICD-10-CM | POA: Diagnosis not present

## 2019-12-05 DIAGNOSIS — I2510 Atherosclerotic heart disease of native coronary artery without angina pectoris: Secondary | ICD-10-CM

## 2019-12-05 HISTORY — PX: UMBILICAL HERNIA REPAIR: SHX196

## 2019-12-05 LAB — MRSA PCR SCREENING: MRSA by PCR: NEGATIVE

## 2019-12-05 SURGERY — REPAIR, HERNIA, UMBILICAL, LAPAROSCOPIC
Anesthesia: General | Site: Abdomen

## 2019-12-05 MED ORDER — KCL IN DEXTROSE-NACL 20-5-0.45 MEQ/L-%-% IV SOLN
INTRAVENOUS | Status: DC
  Filled 2019-12-05: qty 1000

## 2019-12-05 MED ORDER — ONDANSETRON HCL 4 MG/2ML IJ SOLN
INTRAMUSCULAR | Status: AC
  Filled 2019-12-05: qty 2

## 2019-12-05 MED ORDER — ACETAMINOPHEN 500 MG PO TABS
1000.0000 mg | ORAL_TABLET | ORAL | Status: AC
  Administered 2019-12-05: 1000 mg via ORAL
  Filled 2019-12-05: qty 2

## 2019-12-05 MED ORDER — ROCURONIUM BROMIDE 10 MG/ML (PF) SYRINGE
PREFILLED_SYRINGE | INTRAVENOUS | Status: DC | PRN
  Administered 2019-12-05: 10 mg via INTRAVENOUS
  Administered 2019-12-05: 50 mg via INTRAVENOUS
  Administered 2019-12-05: 10 mg via INTRAVENOUS

## 2019-12-05 MED ORDER — PROPOFOL 10 MG/ML IV BOLUS
INTRAVENOUS | Status: DC | PRN
  Administered 2019-12-05: 150 mg via INTRAVENOUS

## 2019-12-05 MED ORDER — DILTIAZEM HCL-DEXTROSE 125-5 MG/125ML-% IV SOLN (PREMIX)
5.0000 mg/h | INTRAVENOUS | Status: DC
  Administered 2019-12-05: 10 mg/h via INTRAVENOUS
  Administered 2019-12-05: 15 mg/h via INTRAVENOUS
  Filled 2019-12-05: qty 125

## 2019-12-05 MED ORDER — FENTANYL CITRATE (PF) 250 MCG/5ML IJ SOLN
INTRAMUSCULAR | Status: AC
  Filled 2019-12-05: qty 5

## 2019-12-05 MED ORDER — CHLORHEXIDINE GLUCONATE CLOTH 2 % EX PADS: 6.0000 | MEDICATED_PAD | Freq: Once | CUTANEOUS | Status: DC

## 2019-12-05 MED ORDER — ROCURONIUM BROMIDE 10 MG/ML (PF) SYRINGE
PREFILLED_SYRINGE | INTRAVENOUS | Status: AC
  Filled 2019-12-05: qty 10

## 2019-12-05 MED ORDER — 0.9 % SODIUM CHLORIDE (POUR BTL) OPTIME
TOPICAL | Status: DC | PRN
  Administered 2019-12-05: 1000 mL

## 2019-12-05 MED ORDER — DEXAMETHASONE SODIUM PHOSPHATE 10 MG/ML IJ SOLN
INTRAMUSCULAR | Status: DC | PRN
  Administered 2019-12-05: 8 mg via INTRAVENOUS

## 2019-12-05 MED ORDER — ACETAMINOPHEN 325 MG PO TABS
650.0000 mg | ORAL_TABLET | Freq: Three times a day (TID) | ORAL | Status: DC
  Administered 2019-12-05 – 2019-12-06 (×2): 650 mg via ORAL
  Filled 2019-12-05 (×2): qty 2

## 2019-12-05 MED ORDER — MIDAZOLAM HCL 2 MG/2ML IJ SOLN
INTRAMUSCULAR | Status: DC | PRN
  Administered 2019-12-05: 2 mg via INTRAVENOUS

## 2019-12-05 MED ORDER — DILTIAZEM LOAD VIA INFUSION
INTRAVENOUS | Status: DC | PRN
  Administered 2019-12-05: 5 mg via INTRAVENOUS

## 2019-12-05 MED ORDER — ORAL CARE MOUTH RINSE
15.0000 mL | Freq: Two times a day (BID) | OROMUCOSAL | Status: DC
  Administered 2019-12-05 – 2019-12-06 (×2): 15 mL via OROMUCOSAL

## 2019-12-05 MED ORDER — CHLORHEXIDINE GLUCONATE CLOTH 2 % EX PADS
6.0000 | MEDICATED_PAD | Freq: Every day | CUTANEOUS | Status: DC
  Administered 2019-12-05 – 2019-12-06 (×2): 6 via TOPICAL

## 2019-12-05 MED ORDER — ORAL CARE MOUTH RINSE: 15.0000 mL | Freq: Two times a day (BID) | OROMUCOSAL | Status: DC

## 2019-12-05 MED ORDER — ONDANSETRON HCL 4 MG/2ML IJ SOLN
INTRAMUSCULAR | Status: DC | PRN
  Administered 2019-12-05: 4 mg via INTRAVENOUS

## 2019-12-05 MED ORDER — AMLODIPINE BESYLATE 5 MG PO TABS
5.0000 mg | ORAL_TABLET | Freq: Every day | ORAL | Status: DC
  Administered 2019-12-05: 5 mg via ORAL
  Filled 2019-12-05: qty 1

## 2019-12-05 MED ORDER — OXYCODONE HCL 5 MG PO TABS: 5.0000 mg | ORAL_TABLET | Freq: Once | ORAL | Status: DC | PRN

## 2019-12-05 MED ORDER — LIDOCAINE 2% (20 MG/ML) 5 ML SYRINGE
INTRAMUSCULAR | Status: DC | PRN
  Administered 2019-12-05: 50 mg via INTRAVENOUS

## 2019-12-05 MED ORDER — MORPHINE SULFATE (PF) 2 MG/ML IV SOLN
1.0000 mg | INTRAVENOUS | Status: DC | PRN
  Administered 2019-12-05: 2 mg via INTRAVENOUS
  Filled 2019-12-05 (×2): qty 1

## 2019-12-05 MED ORDER — BUPIVACAINE HCL (PF) 0.25 % IJ SOLN
INTRAMUSCULAR | Status: DC | PRN
  Administered 2019-12-05: 30 mL

## 2019-12-05 MED ORDER — ESMOLOL HCL 100 MG/10ML IV SOLN
INTRAVENOUS | Status: AC
  Filled 2019-12-05: qty 10

## 2019-12-05 MED ORDER — CEFAZOLIN SODIUM-DEXTROSE 2-4 GM/100ML-% IV SOLN
2.0000 g | INTRAVENOUS | Status: AC
  Administered 2019-12-05: 2 g via INTRAVENOUS
  Filled 2019-12-05: qty 100

## 2019-12-05 MED ORDER — HYDROMORPHONE HCL 1 MG/ML IJ SOLN
0.2500 mg | INTRAMUSCULAR | Status: DC | PRN
  Administered 2019-12-05: 0.5 mg via INTRAVENOUS

## 2019-12-05 MED ORDER — ESMOLOL HCL 100 MG/10ML IV SOLN
INTRAVENOUS | Status: DC | PRN
Start: 2019-12-05 — End: 2019-12-05
  Administered 2019-12-05: 50 mg via INTRAVENOUS
  Administered 2019-12-05: 30 mg via INTRAVENOUS
  Administered 2019-12-05: 20 mg via INTRAVENOUS

## 2019-12-05 MED ORDER — PROPOFOL 10 MG/ML IV BOLUS
INTRAVENOUS | Status: AC
  Filled 2019-12-05: qty 20

## 2019-12-05 MED ORDER — MIDAZOLAM HCL 2 MG/2ML IJ SOLN
INTRAMUSCULAR | Status: AC
  Filled 2019-12-05: qty 2

## 2019-12-05 MED ORDER — DEXAMETHASONE SODIUM PHOSPHATE 10 MG/ML IJ SOLN
INTRAMUSCULAR | Status: AC
  Filled 2019-12-05: qty 1

## 2019-12-05 MED ORDER — HYDROMORPHONE HCL 1 MG/ML IJ SOLN
INTRAMUSCULAR | Status: AC
  Administered 2019-12-05: 0.5 mg via INTRAVENOUS
  Filled 2019-12-05: qty 1

## 2019-12-05 MED ORDER — ONDANSETRON HCL 4 MG/2ML IJ SOLN: 4.0000 mg | Freq: Once | INTRAMUSCULAR | Status: DC | PRN

## 2019-12-05 MED ORDER — FENTANYL CITRATE (PF) 100 MCG/2ML IJ SOLN
INTRAMUSCULAR | Status: AC
  Filled 2019-12-05: qty 2

## 2019-12-05 MED ORDER — OXYCODONE HCL 5 MG/5ML PO SOLN: 5.0000 mg | Freq: Once | ORAL | Status: DC | PRN

## 2019-12-05 MED ORDER — DILTIAZEM HCL-DEXTROSE 125-5 MG/125ML-% IV SOLN (PREMIX)
5.0000 mg/h | INTRAVENOUS | Status: DC
  Administered 2019-12-05: 5 mg/h via INTRAVENOUS
  Filled 2019-12-05: qty 125

## 2019-12-05 MED ORDER — METOPROLOL SUCCINATE ER 25 MG PO TB24
50.0000 mg | ORAL_TABLET | Freq: Every day | ORAL | Status: DC
  Administered 2019-12-05: 50 mg via ORAL
  Filled 2019-12-05: qty 2

## 2019-12-05 MED ORDER — PREDNISONE 10 MG PO TABS
10.0000 mg | ORAL_TABLET | Freq: Every day | ORAL | Status: DC
  Administered 2019-12-06: 10 mg via ORAL
  Filled 2019-12-05: qty 1

## 2019-12-05 MED ORDER — LACTATED RINGERS IV SOLN: INTRAVENOUS | Status: DC

## 2019-12-05 MED ORDER — HYDROCODONE-ACETAMINOPHEN 5-325 MG PO TABS
1.0000 | ORAL_TABLET | Freq: Four times a day (QID) | ORAL | Status: DC | PRN
  Administered 2019-12-06: 13:00:00 1 via ORAL
  Filled 2019-12-05: qty 1

## 2019-12-05 MED ORDER — SUGAMMADEX SODIUM 200 MG/2ML IV SOLN
INTRAVENOUS | Status: DC | PRN
  Administered 2019-12-05: 200 mg via INTRAVENOUS

## 2019-12-05 MED ORDER — PANTOPRAZOLE SODIUM 40 MG PO TBEC
40.0000 mg | DELAYED_RELEASE_TABLET | Freq: Every day | ORAL | Status: DC
  Administered 2019-12-06: 40 mg via ORAL
  Filled 2019-12-05 (×2): qty 1

## 2019-12-05 MED ORDER — FENTANYL CITRATE (PF) 100 MCG/2ML IJ SOLN
INTRAMUSCULAR | Status: DC | PRN
  Administered 2019-12-05 (×6): 50 ug via INTRAVENOUS

## 2019-12-05 MED ORDER — BUPIVACAINE HCL (PF) 0.25 % IJ SOLN
INTRAMUSCULAR | Status: AC
  Filled 2019-12-05: qty 30

## 2019-12-05 MED ORDER — HEPARIN SODIUM (PORCINE) 5000 UNIT/ML IJ SOLN
5000.0000 [IU] | Freq: Three times a day (TID) | INTRAMUSCULAR | Status: DC
  Filled 2019-12-05: qty 1

## 2019-12-05 MED ORDER — BUPIVACAINE LIPOSOME 1.3 % IJ SUSP
INTRAMUSCULAR | Status: DC | PRN
  Administered 2019-12-05: 20 mL

## 2019-12-05 SURGICAL SUPPLY — 47 items
APPLIER CLIP 5 13 M/L LIGAMAX5 (MISCELLANEOUS)
APPLIER CLIP ROT 10 11.4 M/L (STAPLE)
BINDER ABDOMINAL 12 ML 46-62 (SOFTGOODS) ×2 IMPLANT
CABLE HIGH FREQUENCY MONO STRZ (ELECTRODE) ×1 IMPLANT
CHLORAPREP W/TINT 26 (MISCELLANEOUS) ×3 IMPLANT
CLIP APPLIE 5 13 M/L LIGAMAX5 (MISCELLANEOUS) IMPLANT
CLIP APPLIE ROT 10 11.4 M/L (STAPLE) IMPLANT
CLOSURE WOUND 1/2 X4 (GAUZE/BANDAGES/DRESSINGS)
COVER SURGICAL LIGHT HANDLE (MISCELLANEOUS) ×3 IMPLANT
COVER WAND RF STERILE (DRAPES) IMPLANT
DECANTER SPIKE VIAL GLASS SM (MISCELLANEOUS) ×1 IMPLANT
DERMABOND ADVANCED (GAUZE/BANDAGES/DRESSINGS) ×2
DERMABOND ADVANCED .7 DNX12 (GAUZE/BANDAGES/DRESSINGS) IMPLANT
DEVICE SECURE STRAP 25 ABSORB (INSTRUMENTS) ×2 IMPLANT
DEVICE TROCAR PUNCTURE CLOSURE (ENDOMECHANICALS) ×3 IMPLANT
DRAPE INCISE IOBAN 66X45 STRL (DRAPES) ×3 IMPLANT
ELECT REM PT RETURN 15FT ADLT (MISCELLANEOUS) ×3 IMPLANT
GLOVE SURG SYN 7.5  E (GLOVE) ×2
GLOVE SURG SYN 7.5 E (GLOVE) ×1 IMPLANT
GLOVE SURG SYN 7.5 PF PI (GLOVE) ×1 IMPLANT
GOWN STRL REUS W/TWL XL LVL3 (GOWN DISPOSABLE) ×9 IMPLANT
IRRIG SUCT STRYKERFLOW 2 WTIP (MISCELLANEOUS)
IRRIGATION SUCT STRKRFLW 2 WTP (MISCELLANEOUS) IMPLANT
KIT BASIN (CUSTOM PROCEDURE TRAY) ×3 IMPLANT
KIT TURNOVER KIT A (KITS) IMPLANT
MESH PARIETEX 4.7 (Mesh General) ×2 IMPLANT
NDL SPNL 22GX3.5 QUINCKE BK (NEEDLE) ×1 IMPLANT
NEEDLE SPNL 22GX3.5 QUINCKE BK (NEEDLE) ×3 IMPLANT
PAD POSITIONING PINK XL (MISCELLANEOUS) ×2 IMPLANT
PENCIL SMOKE EVACUATOR (MISCELLANEOUS) ×2 IMPLANT
PROTECTOR NERVE ULNAR (MISCELLANEOUS) ×2 IMPLANT
SCISSORS LAP 5X35 DISP (ENDOMECHANICALS) ×3 IMPLANT
SET TUBE SMOKE EVAC HIGH FLOW (TUBING) ×2 IMPLANT
SHEARS HARMONIC ACE PLUS 36CM (ENDOMECHANICALS) IMPLANT
SLEEVE XCEL OPT CAN 5 100 (ENDOMECHANICALS) ×5 IMPLANT
STRIP CLOSURE SKIN 1/2X4 (GAUZE/BANDAGES/DRESSINGS) IMPLANT
SUT MNCRL AB 4-0 PS2 18 (SUTURE) ×3 IMPLANT
SUT NOVA NAB GS-21 0 18 T12 DT (SUTURE) ×7 IMPLANT
SUT NOVA T20/GS 25 (SUTURE) IMPLANT
SUT VIC AB 2-0 SH 18 (SUTURE) ×2 IMPLANT
TAPE CLOTH 4X10 WHT NS (GAUZE/BANDAGES/DRESSINGS) IMPLANT
TOWEL OR 17X26 10 PK STRL BLUE (TOWEL DISPOSABLE) ×3 IMPLANT
TOWEL OR NON WOVEN STRL DISP B (DISPOSABLE) ×3 IMPLANT
TRAY FOLEY MTR SLVR 16FR STAT (SET/KITS/TRAYS/PACK) IMPLANT
TRAY LAPAROSCOPIC (CUSTOM PROCEDURE TRAY) ×3 IMPLANT
TROCAR BLADELESS OPT 5 100 (ENDOMECHANICALS) ×3 IMPLANT
TROCAR XCEL NON-BLD 11X100MML (ENDOMECHANICALS) ×3 IMPLANT

## 2019-12-05 NOTE — Telephone Encounter (Signed)
-----   Message from Minna Merritts, MD sent at 12/03/2019  5:51 PM EDT ----- Very elevated cholesterol Given previous CT findings, "Coronary calcium score of 1228. This was 52 percentile for age and sex matched control." We need aggressive treatment Crestor 40 with zetia 10  Goal LDL <70

## 2019-12-05 NOTE — Anesthesia Procedure Notes (Signed)
Procedure Name: Intubation Date/Time: 12/05/2019 1:40 PM Performed by: Niel Hummer, CRNA Pre-anesthesia Checklist: Patient identified, Emergency Drugs available, Suction available and Patient being monitored Patient Re-evaluated:Patient Re-evaluated prior to induction Oxygen Delivery Method: Circle system utilized Preoxygenation: Pre-oxygenation with 100% oxygen Induction Type: IV induction Ventilation: Mask ventilation without difficulty Laryngoscope Size: Mac and 4 Grade View: Grade I Tube type: Oral Tube size: 7.5 mm Number of attempts: 1 Airway Equipment and Method: Stylet Placement Confirmation: ETT inserted through vocal cords under direct vision,  positive ETCO2 and breath sounds checked- equal and bilateral Secured at: 23 cm Tube secured with: Tape Dental Injury: Teeth and Oropharynx as per pre-operative assessment

## 2019-12-05 NOTE — Anesthesia Preprocedure Evaluation (Addendum)
Anesthesia Evaluation  Patient identified by MRN, date of birth, ID band Patient awake    Reviewed: NPO status , Patient's Chart, lab work & pertinent test results, reviewed documented beta blocker date and time   Airway Mallampati: III  TM Distance: >3 FB Neck ROM: Full    Dental no notable dental hx. (+) Teeth Intact   Pulmonary sleep apnea and Continuous Positive Airway Pressure Ventilation , former smoker,    Pulmonary exam normal breath sounds clear to auscultation       Cardiovascular hypertension, Pt. on medications and Pt. on home beta blockers + angina with exertion +CHF  + dysrhythmias Atrial Fibrillation  Rhythm:Irregular Rate:Tachycardia  LVEF 45%   Neuro/Psych  Headaches, PSYCHIATRIC DISORDERS Anxiety TIA Neuromuscular disease    GI/Hepatic GERD  Medicated and Controlled,  Endo/Other    Renal/GU   negative genitourinary   Musculoskeletal  (+) Arthritis , Seronegative arthritis- on steroids and MTX   Abdominal   Peds  Hematology Eliquis therapy- last dose 5/6//21   Anesthesia Other Findings   Reproductive/Obstetrics                            Anesthesia Physical Anesthesia Plan  ASA: III  Anesthesia Plan: General   Post-op Pain Management:    Induction: Intravenous  PONV Risk Score and Plan: 4 or greater and Ondansetron, Midazolam, Dexamethasone and Treatment may vary due to age or medical condition  Airway Management Planned: Oral ETT  Additional Equipment:   Intra-op Plan:   Post-operative Plan: Extubation in OR  Informed Consent: I have reviewed the patients History and Physical, chart, labs and discussed the procedure including the risks, benefits and alternatives for the proposed anesthesia with the patient or authorized representative who has indicated his/her understanding and acceptance.     Dental advisory given  Plan Discussed with: CRNA and  Surgeon  Anesthesia Plan Comments:         Anesthesia Quick Evaluation

## 2019-12-05 NOTE — Telephone Encounter (Signed)
Call to patient. He reports that he is on his way to surgery and is driving. I told patient he can call us back tomorrow or we will call him back. Phone note saved.

## 2019-12-05 NOTE — Anesthesia Postprocedure Evaluation (Signed)
Anesthesia Post Note  Patient: Jake Liu  Procedure(s) Performed: LAPAROSCOPIC UMBILICAL HERNIA REPAIR (N/A Abdomen)     Patient location during evaluation: PACU Anesthesia Type: General Level of consciousness: awake and alert and oriented Pain management: pain level controlled Vital Signs Assessment: post-procedure vital signs reviewed and stable Respiratory status: spontaneous breathing, nonlabored ventilation and respiratory function stable Cardiovascular status: blood pressure returned to baseline and stable Postop Assessment: no apparent nausea or vomiting Anesthetic complications: no    Last Vitals:  Vitals:   12/05/19 1600 12/05/19 1615  BP: (!) 126/92 (!) 131/97  Pulse: 99 95  Resp: 16 20  Temp:    SpO2: 92% 92%    Last Pain:  Vitals:   12/05/19 1615  TempSrc:   PainSc: 5                  Delphia Kaylor A.

## 2019-12-05 NOTE — Telephone Encounter (Signed)
Patient called back and I informed him about his lab results and that they needed to be checked again after his surgery per [name]. Patient agreed and had no further questions. Patient's labs will be ordered and I told him that I would mail him his appointment reminder.

## 2019-12-05 NOTE — Transfer of Care (Signed)
Immediate Anesthesia Transfer of Care Note  Patient: Jake Liu  Procedure(s) Performed: LAPAROSCOPIC UMBILICAL HERNIA REPAIR (N/A Abdomen)  Patient Location: PACU  Anesthesia Type:General  Level of Consciousness: awake, alert  and oriented  Airway & Oxygen Therapy: Patient Spontanous Breathing and Patient connected to face mask oxygen  Post-op Assessment: Report given to RN, Post -op Vital signs reviewed and stable and Patient moving all extremities X 4  Post vital signs: Reviewed and stable  Last Vitals:  Vitals Value Taken Time  BP 137/95 12/05/19 1530  Temp    Pulse 86 12/05/19 1530  Resp 15 12/05/19 1530  SpO2 93 % 12/05/19 1530  Vitals shown include unvalidated device data.  Last Pain:  Vitals:   12/05/19 1226  TempSrc: Oral  PainSc:       Patients Stated Pain Goal: 4 ([id number])  Complications: No apparent anesthesia complications

## 2019-12-05 NOTE — Op Note (Signed)
OPERATIVE NOTE  12/05/2019  3:20 PM  PATIENT:  Jake Liu, 59 y.o., male, MRN: [id number]  PREOP DIAGNOSIS:  UMBILICAL HERNIA  POSTOP DIAGNOSIS:   Chronically incarcerated umbilical hernia  PROCEDURE:   Procedure(s):  LAPAROSCOPIC UMBILICAL HERNIA REPAIR  SURGEON:   Alphonsa Overall, M.D.  ASSISTANT:   None  ANESTHESIA:   general  Anesthesiologist: Josephine Igo, MD CRNA: Niel Hummer, CRNA  General  EBL:  minimal  ml  BLOOD ADMINISTERED: none  DRAINS: none   LOCAL MEDICATIONS USED:   30 cc of 1/4% marcaine + 20 cc of Exparel  SPECIMEN:   Umbilical hernia sac  COUNTS CORRECT:  YES  INDICATIONS FOR PROCEDURE:  Jake Liu is a 59 y.o. (DOB: [birth year]) white male whose primary care physician is Arnett, Yvetta Coder, FNP and comes for repair of umbilical hernia.   The indications and risks of the surgery were explained to the patient.  The risks include, but are not limited to, infection, bleeding, and nerve injury.  OPERATIVE NOTE:  The patient was taken to room 1 at [facility].  He underwent a general anesthesia.  He was given 2 grams of Ancef at the beginning of the operation.   A time out was held and the surgical checklist run.   The abdomen was prepped with Cholroprep and sterilely draped.  I covered the abdomen with a Ioban drape.   I accessed the LUQ with a 5 mm trocar.  Two additional 5 mm trocars wwere placed - one in the left mid abdomen and one in the right upper quadrant.   He had a 3 cm defect at the umbilicus.  The small bowel was hemorrhagic and adhered to the undersurface of the umbilical hernia.  I think that the small bowel was flipping in and out of umbilical defect.  This left a hemorrhagic and thickened small bowel that was seen on CT scan in February, 2021.  The small bowel was viable, there was no evidence of infection, so I proceeded with mesh repair.   I did run the small bowel from the terminal ileum to the ligament of Tritz.  The only bowel  abnormal was this beat up bowel that I had been in the umbilical hernia and was about 18 inches from the terminal ileum.   Because there was such a large hernia sac, I made a supraumbilical incision and resected the sac.  I sen the sac to pathology. I inserted the 12 cm Parietex mesh into the abdominal cavity through the hernia defect before closing it.   I closed the umbilical hernia defect with interrupted 0 Novafil.   Then I placed a 12 cm round Parietex mesh in the abdomen.  It had 4 holding sutures of 0 Novafil.  These were tied down to secure the mesh to the anterior abdominal wall.  I then used 25 Securestrap tacks to tack the mesh to the anterior abdominal wall.   The abdomen was deflated.  The mesh was inspected and there were no defects around the edge.   The trocars were then removed.  There was no bleeding at the trocar sites.  The incisions were closed with 4-0 Monocryl and painted with DermaBond. The puncture sites were painted with DermaBond.   The sponge and needle count were correct.  The patient had an abdominal binder placed.  The patient was transferred to the recovery room in good condition.    Type of repair - primary suture and mesh  (choices - primary suture,  mesh, or component)  Name of mesh - Parietix  Size of mesh - Length 12 cm, Width 12 cm  Mesh overlap - 5 cm  Placement of mesh - beneath fascia and into the peritoneal cavity  (choices - beneath fascia and into peritoneal cavity, beneath fascia but external to peritoneal cavity, between the muscle and fascia, above or external to fascia)   Left upper picture - small bowel with hemorrhage of mesentery Right upper picture - adhesion of small bowel to umbilical hernia defect Left lower picture - small bowel freed from umbilical hernia Left lower picture - 12 cm mesh repair of hernia   Alphonsa Overall, MD, [facility] Surgery Office phone:  [phone]

## 2019-12-05 NOTE — Interval H&P Note (Signed)
History and Physical Interval Note:  12/05/2019 1:30 PM  Jake Liu  has presented today for surgery, with the diagnosis of UMBILICAL HERNIA.  The various methods of treatment have been discussed with the patient and family.   He has bounced into A fib.  To contact his daughter after surgery.  He will stay the night.  After consideration of risks, benefits and other options for treatment, the patient has consented to  Procedure(s): [place] (N/A) as a surgical intervention.  The patient's history has been reviewed, patient examined, no change in status, stable for surgery.  I have reviewed the patient's chart and labs.  Questions were answered to the patient's satisfaction.     Shann Medal

## 2019-12-05 NOTE — Consult Note (Signed)
Cardiology Consultation:   Patient ID: Jagan Voda MRN: [id number]; DOB: 02/25/1961  Admit date: 12/05/2019 Date of Consult: 12/05/2019  Primary Care Provider: Burnard Hawthorne, FNP Primary Cardiologist: Ida Rogue, MD  Primary Electrophysiologist:  Marzetta Board, MD    Patient Profile:   Nayson Spaulding is a 59 y.o. male with a hx of atrial fibrillation who is being seen today for the evaluation of atrial fibrillation RVR postoperatively at the request of Dr. [name], surgery.  History of Present Illness:   Mr. Spillman is a pleasant 59 year old gentleman with a history of atrial fibrillation status post 2 prior cardioversions and prior ablation January 2017 on whom we are consulted for atrial fibrillation with rapid ventricular response which began preoperatively due to pain related to incarcerated umbilical hernia now status post laparoscopic umbilical hernia repair with Dr. Alphonsa Overall.  Mr. Bowcutt is in good spirits, laughing and joking.  Hemodynamically stable with a heart rate of approximately 100 when I saw him in the postop recovery unit.  ECG independently reviewed demonstrating atrial fibrillation with a heart rate of 100.  He follows with [name] in electrophysiology at [facility] for his atrial fibrillation care.  He has a history of obstructive sleep apnea, and endorses prior wine consumption as triggers for his atrial fibrillation.  He also notes high stress job that does not promote a healthy lifestyle.  He has been free of atrial fibrillation since his ablation in January 2017 and feels his energy levels were quite a bit better, however in April 2021 he visited his electrophysiologist due to sustained palpitations and an ECG documenting atrial fibrillation.  He notes that yesterday evening he had severe "jabbing" pain and feels this must be the reason he went back into atrial fibrillation.  He was also seen on November 11, 2019 by Laurann Montana, NP in [facility]  for preoperative cardiovascular evaluation for surgery.  At his appointment in April 2021 with his cardiologist as well as his electrophysiologist he was noted to be in normal sinus rhythm.  He is on metoprolol succinate 50 mg daily for rate control and Xarelto for anticoagulation with a chads vas score of 2 for hypertension and coronary artery calcifications indicative of CAD.  He is currently on a diltiazem infusion for atrial fibrillation and rates are approximately 90-100.  There are no current laboratory studies to review for this admission.   Past Medical History:  Diagnosis Date  . Anginal pain ([place])   . Arthritis   . Atrial fibrillation ([place])   . Cardiomyopathy ([place])    EF: 45%  . CHF (congestive heart failure) ([place])   . Fatty liver    borderline  . GERD (gastroesophageal reflux disease)   . Headache   . History of TIAs   . Hypertension   . Insomnia   . Sciatica   . Seronegative arthritis 09/27/2019  . Sleep apnea    No CPAP    Past Surgical History:  Procedure Laterality Date  . ABLATION N/A    for af  . COLONOSCOPY    . COLONOSCOPY WITH PROPOFOL N/A 10/13/2018   Procedure: COLONOSCOPY WITH PROPOFOL;  Surgeon: Toledo, [name], MD;  Location: [facility] ENDOSCOPY;  Service: Gastroenterology;  Laterality: N/A;  . ESOPHAGOGASTRODUODENOSCOPY    . ESOPHAGOGASTRODUODENOSCOPY (EGD) WITH PROPOFOL N/A 10/13/2018   Procedure: ESOPHAGOGASTRODUODENOSCOPY (EGD) WITH PROPOFOL;  Surgeon: Toledo, [name], MD;  Location: [facility] ENDOSCOPY;  Service: Gastroenterology;  Laterality: N/A;  . HERNIA REPAIR    . KNEE  SURGERY Left      Home Medications:  Prior to Admission medications   Medication Sig Start Date End Date Taking? Authorizing Provider  amLODipine (NORVASC) 5 MG tablet TAKE 1 TABLET(5 MG) BY MOUTH DAILY Patient taking differently: Take 5 mg by mouth at bedtime.  03/28/19  Yes Arnett, Yvetta Coder, FNP  folic acid (FOLVITE) 1 MG tablet Take 1 mg by mouth every morning. 11/21/19  Yes  [provider]  methotrexate 2.5 MG tablet Take 15 mg by mouth every Wednesday. 11/17/19  Yes [provider]  metoprolol succinate (TOPROL-XL) 50 MG 24 hr tablet Take 1 tablet (50 mg total) by mouth daily. Take with or immediately following a meal. Patient taking differently: Take 50 mg by mouth at bedtime. Take with or immediately following a meal. 01/14/13  Yes Jackolyn Confer, MD  omeprazole (PRILOSEC) 40 MG capsule Take 1 capsule (40 mg total) by mouth 2 (two) times daily. 11/01/19 12/01/19 Yes [name] B, MD  predniSONE (DELTASONE) 20 MG tablet Take 10-40 mg by mouth See admin instructions. Tapering dose down:40 mg daily until 5/7, 30 mg daily on 05/8, 20 mg daily on 05/09 09/27/19  Yes [provider]  rivaroxaban (XARELTO) 20 MG TABS tablet Take 20 mg by mouth at bedtime.  04/01/13  Yes [provider]  ezetimibe (ZETIA) 10 MG tablet Take 1 tablet (10 mg total) by mouth daily. Patient not taking: Reported on 11/30/2019 11/11/19 05/09/20  Loel Dubonnet, NP    Inpatient Medications: Scheduled Meds: . bupivacaine liposome  20 mL Infiltration Once  . Chlorhexidine Gluconate Cloth  6 each Topical Once   And  . Chlorhexidine Gluconate Cloth  6 each Topical Once   Continuous Infusions: . diltiazem (CARDIZEM) infusion 15 mg/hr (12/05/19 1409)  . lactated ringers 50 mL/hr at 12/05/19 1213   PRN Meds: 0.9 % irrigation (POUR BTL), bupivacaine (PF), bupivacaine liposome, HYDROmorphone (DILAUDID) injection, ondansetron (ZOFRAN) IV, oxyCODONE **OR** oxyCODONE  Allergies:    Allergies  Allergen Reactions  . Sulfa Antibiotics Hives    rash   . Sulfasalazine Hives  . Bactrim [Sulfamethoxazole-Trimethoprim]     Sulfa/Rash  . Lisinopril     cough  . Zetia [Ezetimibe]     Joint pain    Social History:   Social History   Socioeconomic History  . Marital status: Single    Spouse name: Not on file  . Number of children: Not on file  . Years of  education: Not on file  . Highest education level: Not on file  Occupational History  . Not on file  Tobacco Use  . Smoking status: Former Research scientist (life sciences)  . Smokeless tobacco: Never Used  Substance and Sexual Activity  . Alcohol use: Not Currently    Alcohol/week: 14.0 standard drinks    Types: 7 Glasses of wine, 7 Standard drinks or equivalent per week    Comment: drinks at night., bottle and half of red wine;  Drinks 10 cups coffee daily  . Drug use: No  . Sexual activity: Not on file  Other Topics Concern  . Not on file  Social History Narrative   Sales, fuels.    Girlfriend   Children      Social Determinants of Health   Financial Resource Strain:   . Difficulty of Paying Living Expenses:   Food Insecurity:   . Worried About Charity fundraiser in the Last Year:   . [place] in the Last Year:  Transportation Needs:   . Film/video editor (Medical):   Marland Kitchen Lack of Transportation (Non-Medical):   Physical Activity:   . Days of Exercise per Week:   . Minutes of Exercise per Session:   Stress:   . Feeling of Stress :   Social Connections:   . Frequency of Communication with Friends and Family:   . Frequency of Social Gatherings with Friends and Family:   . Attends Religious Services:   . Active Member of Clubs or Organizations:   . Attends Archivist Meetings:   Marland Kitchen Marital Status:   Intimate Partner Violence:   . Fear of Current or Ex-Partner:   . Emotionally Abused:   Marland Kitchen Physically Abused:   . Sexually Abused:     Family History:    Family History  Problem Relation Age of Onset  . Heart disease Mother 53  . Asthma Father      ROS:  Please see the history of present illness.   All other ROS reviewed and negative.     Physical Exam/Data:   Vitals:   12/05/19 1211 12/05/19 1226 12/05/19 1530 12/05/19 1545  BP:  122/90 (!) 137/95 (!) 130/94  Pulse:  78 86 (!) 116  Resp:  18 15 14   Temp:  99.3 F (37.4 C) 97.8 F (36.6 C)   TempSrc:  Oral     SpO2:  99% 94% 92%  Weight: 94.3 kg     Height: 6' (1.829 m)       Intake/Output Summary (Last 24 hours) at 12/05/2019 1549 Last data filed at 12/05/2019 1531 Gross per 24 hour  Intake 1100 ml  Output 10 ml  Net 1090 ml   Last 3 Weights 12/05/2019 12/01/2019 11/11/2019  Weight (lbs) 208 lb 208 lb 211 lb  Weight (kg) 94.348 kg 94.348 kg 95.709 kg     Body mass index is 28.21 kg/m.   Constitutional: No acute distress Eyes: sclera non-icteric, normal conjunctiva and lids ENMT: normal dentition, moist mucous membranes Cardiovascular: irregular rhythm, tachycardic, no murmurs. S1 and S2 normal. Radial pulses normal bilaterally. No jugular venous distention.  Respiratory: clear to auscultation bilaterally GI : abdominal binder in place post op   MSK: extremities warm, well perfused. No edema.  NEURO: grossly nonfocal exam, moves all extremities. PSYCH: alert and oriented x 3, normal mood and affect.   EKG:  The EKG was personally reviewed and demonstrates:  afib 100 bpm Telemetry:  Telemetry was personally reviewed and demonstrates:  afib rate 90-100 bpm  Relevant CV Studies: n/a  Laboratory Data:  High Sensitivity Troponin:  No results for input(s): TROPONINIHS in the last 720 hours.   Chemistry Recent Labs  Lab 11/28/19 1559  NA 138  K 4.8  CL 102  CO2 26  GLUCOSE 110*  BUN 18  CREATININE 0.91  0.98  CALCIUM 9.4  GFRNONAA >60  >60  GFRAA >60  >60  ANIONGAP 10    Recent Labs  Lab 11/28/19 1559  PROT 7.2  ALBUMIN 4.0  AST 17  17  ALT 18  19  ALKPHOS 65  BILITOT 0.8   Hematology Recent Labs  Lab 11/28/19 1559  WBC 11.1*  RBC 5.17  HGB 14.4  HCT 44.5  MCV 86.1  MCH 27.9  MCHC 32.4  RDW 15.9*  PLT 387   BNPNo results for input(s): BNP, PROBNP in the last 168 hours.  DDimer No results for input(s): DDIMER in the last 168 hours.   Radiology/Studies:  No results  found.   Assessment and Plan:   1. Postop atrial fibrillation Patient has a  known history of atrial fibrillation and presented to his cardiologist in April with apparent recurrence of atrial fibrillation after ablation.  Ablation was performed in January 2017.  He feels the severe pain of incarcerated hernia on presentation to the ER was a trigger for his atrial fibrillation. He is in good spirits, hemodynamically stable without chest pain or shortness of breath.  He is currently on a diltiazem infusion for rate control and had rapid rates during surgery.  Recommendations: -Continue diltiazem infusion today, titrate to a heart rate less than 100 bpm. -We will restart patient's home metoprolol succinate 50 mg daily which can be given tonight, at which point attempts at weaning diltiazem infusion can be made. -Discussed with Dr. [name], he would like to hold Xarelto for an additional 1 to 2 days given surgical factors, this is reasonable given the patient's low CHA2DS2-VASc score.  I discussed this with the patient and his low daily risk of stroke while holding Xarelto for an additional 1 to 2 days.  We will follow along and see the patient in the morning.      For questions or updates, please contact [place] Please consult [email or web address] for contact info under     Signed, Elouise Munroe, MD  12/05/2019 3:49 PM

## 2019-12-06 ENCOUNTER — Telehealth: Payer: Self-pay | Admitting: Student

## 2019-12-06 ENCOUNTER — Encounter: Payer: Self-pay | Admitting: *Deleted

## 2019-12-06 DIAGNOSIS — K420 Umbilical hernia with obstruction, without gangrene: Secondary | ICD-10-CM | POA: Diagnosis not present

## 2019-12-06 DIAGNOSIS — I480 Paroxysmal atrial fibrillation: Secondary | ICD-10-CM | POA: Diagnosis not present

## 2019-12-06 LAB — SURGICAL PATHOLOGY

## 2019-12-06 MED ORDER — METOPROLOL SUCCINATE ER 25 MG PO TB24
75.0000 mg | ORAL_TABLET | Freq: Every day | ORAL | Status: DC
  Administered 2019-12-06: 75 mg via ORAL
  Filled 2019-12-06: qty 3

## 2019-12-06 MED ORDER — PRO-STAT SUGAR FREE PO LIQD
30.0000 mL | Freq: Three times a day (TID) | ORAL | Status: DC
  Filled 2019-12-06: qty 30

## 2019-12-06 MED ORDER — HYDROCODONE-ACETAMINOPHEN 5-325 MG PO TABS: 1.0000 | ORAL_TABLET | Freq: Four times a day (QID) | ORAL | 0 refills | Status: DC | PRN

## 2019-12-06 MED ORDER — METOPROLOL SUCCINATE ER 25 MG PO TB24: 75.0000 mg | ORAL_TABLET | Freq: Every day | ORAL | Status: DC

## 2019-12-06 MED ORDER — ADULT MULTIVITAMIN W/MINERALS CH
1.0000 | ORAL_TABLET | Freq: Every day | ORAL | Status: DC
  Administered 2019-12-06: 1 via ORAL

## 2019-12-06 MED ORDER — METOPROLOL SUCCINATE ER 50 MG PO TB24: 75.0000 mg | ORAL_TABLET | Freq: Every day | ORAL | 3 refills | Status: DC

## 2019-12-06 MED ORDER — BOOST / RESOURCE BREEZE PO LIQD CUSTOM
1.0000 | Freq: Two times a day (BID) | ORAL | Status: DC
  Administered 2019-12-06: 1 via ORAL

## 2019-12-06 NOTE — Progress Notes (Addendum)
Progress Note  Patient Name: Jake Liu Date of Encounter: 12/06/2019  Primary Cardiologist: Ida Rogue, MD   Subjective   No acute overnight events. Patient doing well this morning. He reports just feeling very fatigue and wiped out, especially in his head, while in atrial fibrillation but denies any palpitations, chest pain, or shortness of breath. Pain from umbilical hernia repair well OK at rest but increasing when he ambulates. Currently has an abdominal binder in place.  Inpatient Medications    Scheduled Meds: . acetaminophen  650 mg Oral Q8H  . amLODipine  5 mg Oral QHS  . Chlorhexidine Gluconate Cloth  6 each Topical Q0600  . heparin injection (subcutaneous)  5,000 Units Subcutaneous Q8H  . mouth rinse  15 mL Mouth Rinse BID  . metoprolol succinate  50 mg Oral QHS  . pantoprazole  40 mg Oral Daily  . predniSONE  10 mg Oral Q breakfast   Continuous Infusions: . dextrose 5 % and 0.45 % NaCl with KCl 20 mEq/L 50 mL/hr at 12/06/19 0600  . diltiazem (CARDIZEM) infusion Stopped (12/06/19 0055)   PRN Meds: HYDROcodone-acetaminophen, morphine injection   Vital Signs    Vitals:   12/06/19 0300 12/06/19 0400 12/06/19 0500 12/06/19 0600  BP: 95/60 (!) 96/57 (!) 96/51 118/77  Pulse: 65 72 89 94  Resp: 12 15 12 15   Temp:  (!) 97.3 F (36.3 C)    TempSrc:  Axillary    SpO2: 93% (!) 89% 90% 98%  Weight:      Height:        Intake/Output Summary (Last 24 hours) at 12/06/2019 0709 Last data filed at 12/06/2019 0600 Gross per 24 hour  Intake 1870.94 ml  Output 2310 ml  Net -439.06 ml   Last 3 Weights 12/05/2019 12/05/2019 12/01/2019  Weight (lbs) 209 lb 3.5 oz 208 lb 208 lb  Weight (kg) 94.9 kg 94.348 kg 94.348 kg      Telemetry    Atrial fibrillation with rates mostly in the 80's to 90's during the day (drop to 60's overnight). - Personally Reviewed  ECG    No new ECG tracing today. - Personally Reviewed  Physical Exam   GEN: No acute distress.   Neck:  Supple. Cardiac: Irregularly irregular rhythm with normal rate. No murmurs, rubs, or gallops. Radial and distal pedal pulses 2+ and equal bilaterally. Respiratory: Clear to auscultation bilaterally. GI: Abdominal binder in place. MS: No lower extremity edema. No deformity. Skin: Warm and dry. Neuro:  No focal deficits. Psych: Normal affect.  Labs    High Sensitivity Troponin:  No results for input(s): TROPONINIHS in the last 720 hours.    ChemistryNo results for input(s): NA, K, CL, CO2, GLUCOSE, BUN, CREATININE, CALCIUM, PROT, ALBUMIN, AST, ALT, ALKPHOS, BILITOT, GFRNONAA, GFRAA, ANIONGAP in the last 168 hours.   HematologyNo results for input(s): WBC, RBC, HGB, HCT, MCV, MCH, MCHC, RDW, PLT in the last 168 hours.  BNPNo results for input(s): BNP, PROBNP in the last 168 hours.   DDimer No results for input(s): DDIMER in the last 168 hours.   Radiology    No results found.  Cardiac Studies   Echocardiogram 10/02/2017: Interpretation: - Normal LV function. - Normal LA pressures with normal diastolic function. - Normal RV systolic function. - Valvular regurgitation: Mild AR, trivial MR, trivial TR. - No valvular stenosis. _______________  CT Cardiac Scoring 08/31/2018: Coronary calcium score of 1228. This was 27 percentile for age and sex matched control.  Patient Profile  59 y.o. male with a history of coronary calcium score of 1228 in 08/2018, atrial fibrillation s/p DCCV x2 and ultimate ablation in 2017 on Xarelto, TIA, sleep apnea not on CPAP, hypertension, GERD, and fatty liver who presented yesterday for a laparoscopic umbilical hernia repair and was noted to be in atrial fibrillation with RVR pre and post surgery. Cardiology consulted for further evaluation.  Assessment & Plan    Peri-Op Atrial Fibrillation - Presented for laparoscopic umbilical hernia repair and found to be in rapid atrial fibrillation pre and post surgery. - Rates mostly in the 60's to 80's. -  Initially started on IV Cardizem but this was able to be weaned off last night.  - Continue Toprol_XL 50mg  daily. Will increase Toprol-XL to 75mg  daily.  - Restart Xarelto when OK with Dr. [name]. Per note on 5/10, he would like to hold Xarelto for an additional 1-2 days given surgical factors.  - Patient will need to follow-up with Dr. [name] at [facility] (his primary Electrophysiologist).  Hypertension - BP soft at times (as low as 92/46) but stable. - Will increase Toprol as above for additional rate control. - Can decrease Amlodipine to 2.5mg  daily if needed.  Otherwise, per primary team.   For questions or updates, please contact [place] Please consult [email or web address] for contact info under        Signed, Darreld Mclean, PA-C  12/06/2019, 7:09 AM    Patient seen and examined with Sande Rives PA-C.  Agree as above, with the following exceptions and changes as noted below. Hemodynamically stable, has fatigue and feels full in his head, which are his typical symptoms with atrial fibrillation.  He says he feels the way he did prior to his ablation.  Gen: NAD, CV: Irregular rhythm, normal rate, no murmurs, Lungs: clear, Abd: Binder in place, Extrem: Warm, well perfused, no edema, Neuro/Psych: alert and oriented x 3, normal mood and affect. All available labs, radiology testing, previous records reviewed.  We will increase his rate control with metoprolol succinate.  Home dose is 50 mg daily we will increase to 75 mg daily.  Resume anticoagulation when safe from a surgical standpoint.  We will arrange follow-up for the patient in our heart care office in [facility] where he follows with Dr. [name], and we have requested the patient arrange follow-up with his electrophysiologist Dr. [name] at [facility].  If he is still in atrial fibrillation after 3 to 4 weeks of uninterrupted anticoagulation, can consider repeat cardioversion.  The patient's heart rate and blood pressure are acceptable for  hospital dismissal from a cardiovascular standpoint when ready from a postsurgical standpoint.  Elouise Munroe, MD 12/06/19 1:13 PM

## 2019-12-06 NOTE — Telephone Encounter (Signed)
Dr. [name] is calling requesting to speak with Jake Liu in regards to Jake Liu that she previously saw at the hospital earlier today. I advised him I would send a message in regards to it and also sent her a page. Please advise.

## 2019-12-06 NOTE — Discharge Summary (Signed)
Physician Discharge Summary  Patient ID:  Jake Liu  MRN: [id number]  DOB/AGE: [birth year] 60 y.o.  Admit date: 12/05/2019 Discharge date: 12/06/2019  Discharge Diagnoses:  1.  Chronic incarcerated umbilical hernia         2.ATRIAL FIBRILLATION (Z86.79)             He is followed at [facility] - [facility] (was just seen on 11/01/2019 3.  ANTICOAGULATED (Z79.01)  Xarelto held for surgery  4.ARTHRITIS (M19.90) seeing rheumatology - [facility] Liu 5. HTN 6. Borderline sleep apnea - he is not being treated 7. On prednisone for RA             10 mg qd   Active Problems:   Umbilical hernia, incarcerated  Operation: Procedure(s):  LAPAROSCOPIC UMBILICAL HERNIA REPAIR on 12/05/2019 Jake Liu  Discharged Condition: good  Liu Course: Jake Liu is an 59 y.o. male whose primary care physician is Burnard Hawthorne, FNP and who was admitted 12/05/2019 with a chief complaint of painful umbilical hernia.   He was brought to the operating room on 12/05/2019 and underwent [place].   Prior to coming to the [facility] - he had gone into A fib.  He said the pain from the umbilical hernia started this.  Dr. Kelvin Cellar saw him for cardiology post op.  He was placed on a cardizem drip in the step down unit. His metoprolol was increased. He will have follow up with both cardiology and me.  The discharge instructions were reviewed with the patient.  Consults: cardiology  Significant Diagnostic Studies: Results for orders placed or performed during the [facility] encounter of 12/05/19  MRSA PCR Screening   Specimen: Nasal Mucosa; Nasopharyngeal  Result Value Ref Range   MRSA by PCR NEGATIVE NEGATIVE  Surgical pathology  Result Value Ref Range   SURGICAL PATHOLOGY      SURGICAL PATHOLOGY CASE: [id number] PATIENT: Jake Liu Surgical Pathology Report     Clinical History: Umbilical hernia (crm)     FINAL  MICROSCOPIC DIAGNOSIS:  A. HERNIA SAC: - Hernia sac    GROSS DESCRIPTION:  Received fresh is a 5.3 x 4.3 x 2 cm soft well-defined fatty tissue with one aspect having pink-white to pink-red smooth and glistening to focally roughened fibrous-like tissue up to 0.3 cm thick.  Cut surfaces through fatty tissue are unremarkable.  Representative sections in one block.  SW 12/05/2019    Final Diagnosis performed by Jake Folds, MD.   Electronically signed 12/06/2019 Technical and / or Professional components performed at [facility] Liu, [place] [address] [place], [place] [address].  Immunohistochemistry Technical component (if applicable) was performed at [facility]. [address] [place], [place], [place] [address].   IMMUNOHISTOCHEMISTRY DISCLAIMER (if applicable): Some of t hese immunohistochemical stains may have been developed and the performance characteristics determine by [facility]. Some may not have been cleared or approved by the U.S. Food and Drug Administration. The FDA has determined that such clearance or approval is not necessary. This test is used for clinical purposes. It should not be regarded as investigational or for research. This laboratory is certified under the [employer] (CLIA-88) as qualified to perform high complexity clinical laboratory testing.  The controls stained appropriately.     No results found.  Discharge Exam:  Vitals:   12/06/19 0900 12/06/19 1000  BP: 134/80   Pulse: 91 97  Resp: 11 20  Temp:  SpO2: 96% 90%    General: WN WM who is alert and generally healthy appearing.  Lungs: Clear to auscultation and symmetric breath sounds. Heart:  A. Fib. No murmur or rub. Abdomen: Soft. No mass. Has bowel sounds.  He is wearing an abdominal binder.  Discharge Medications:   Allergies as of 12/06/2019      Reactions   Sulfa Antibiotics Hives    rash   Sulfasalazine Hives   Bactrim [sulfamethoxazole-trimethoprim]    Sulfa/Rash   Lisinopril    cough   Zetia [ezetimibe]    Joint pain      Medication List    TAKE these medications   amLODipine 5 MG tablet Commonly known as: NORVASC TAKE 1 TABLET(5 MG) BY MOUTH DAILY What changed: See the new instructions.   ezetimibe 10 MG tablet Commonly known as: ZETIA Take 1 tablet (10 mg total) by mouth daily.   folic acid 1 MG tablet Commonly known as: FOLVITE Take 1 mg by mouth every morning.   HYDROcodone-acetaminophen 5-325 MG tablet Commonly known as: NORCO/VICODIN Take 1 tablet by mouth every 6 (six) hours as needed for moderate pain.   methotrexate 2.5 MG tablet Take 15 mg by mouth every Wednesday.   metoprolol succinate 50 MG 24 hr tablet Commonly known as: TOPROL-XL Take 1.5 tablets (75 mg total) by mouth daily. Take with or immediately following a meal. What changed: how much to take   omeprazole 40 MG capsule Commonly known as: PRILOSEC Take 1 capsule (40 mg total) by mouth 2 (two) times daily.   predniSONE 20 MG tablet Commonly known as: DELTASONE Take 10-40 mg by mouth See admin instructions. Tapering dose down:40 mg daily until 5/7, 30 mg daily on 05/8, 20 mg daily on 05/09   Xarelto 20 MG Tabs tablet Generic drug: rivaroxaban Take 20 mg by mouth at bedtime.       Disposition: Discharge disposition: 01-Home or Self Care       Discharge Instructions    Diet - low sodium heart healthy   Complete by: As directed    Increase activity slowly   Complete by: As directed       Follow-up Information    Loel Dubonnet, NP Follow up.   Specialty: Cardiology Why: [facility] follow-up scheduled for 12/19/2019 at 9:00am. Please arrive 15 minutes early for check-in. If this date/time does not work for you, please call our office to reschedule.  Contact information: [address] [place] [address] [phone]             Signed: Alphonsa Liu, M.D., [facility] Office:  [phone]  12/06/2019, 1:54 PM

## 2019-12-06 NOTE — Discharge Instructions (Signed)
[facility] [place] [facility] - DISCHARGE INSTRUCTIONS TO PATIENT  Activity:  Driving - May drive in 2 or 3 days and off pain meds   Lifting - No lifting more than 15 pounds for one month                       Practice your Covid-19 protection:  Wear a mask, social distance, and wash your hands frequently  Wound Care:   May remove bandages and shower tomorrow (5/12)        Wear abdominal binder while awake for one month.  You do not need to wear the binder while sleeping - some patients do and some don't.  Diet:  As tolerated  Follow up appointment:  Call Dr. Pollie Friar office [facility]) at [phone] for an appointment in 2-3 weeks.        Follow up as outlined by cardiology.  Medications and dosages:  Resume your home medications.  You have a prescription for:  Vicodin         Restart Xarelto tomorrow (5/12)  Call Dr. [name] or his office  [phone]) if you have:  Temperature greater than 100.4,  Persistent nausea and vomiting,  Severe uncontrolled pain,  Redness, tenderness, or signs of infection (pain, swelling, redness, odor or green/yellow discharge around the site),  Difficulty breathing, headache or visual disturbances,  Any other questions or concerns you may have after discharge.  In an emergency, call 911 or go to an Emergency Department at a nearby hospital.
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
## 2019-12-06 NOTE — Telephone Encounter (Signed)
Spoke with Sande Rives, she has spoken to Dr. [name].

## 2019-12-06 NOTE — Progress Notes (Signed)
Initial Nutrition Assessment  DOCUMENTATION CODES:   Not applicable  INTERVENTION:  - will order Boost Breeze BID, each supplement provides 250 kcal and 9 grams of protein. - will order 30 mL Prostat TID, each supplement provides 100 kcal and 15 grams of protein. - will order daily multivitamin with minerals. - diet advancement as medically feasible.    NUTRITION DIAGNOSIS:   Increased nutrient needs related to acute illness, post-op healing as evidenced by estimated needs.  GOAL:   Patient will meet greater than or equal to 90% of their needs  MONITOR:   PO intake, Supplement acceptance, Diet advancement, Labs, Weight trends, Skin  REASON FOR ASSESSMENT:   Malnutrition Screening Tool  ASSESSMENT:   59 year-old male with medical history of joint issues (thought to be RA), afib, and HTN. Patient presented to the ED due to severe abdominal pain, sharp in nature. He was found to have an incarcerated umbilical hernia and underwent lap repair on 5/10.  POD #1 lap umbilical hernia repair.  Diet advanced from NPO to CLD yesterday at 4 and no intakes documented since that time. Patient reports consuming 1.5 cups of coffee, 50% of italian ice, and most of a cup of veggie broth this AM. He has some tenderness/soreness to lower abdomen (surgical site) but no severe abdominal pain, N/V.  Patient reports that he was in his usual state of health until 06/2019 when he began to experience joint pain and joint swelling which was present in all extremities and limited his range of motion, ability to walk, and led to him being unable to get up to the bathroom on his own. He states that it is believed that this is RA and that he has been placed on a course of steroids which has been helpful. He has not been consuming pasta, bread, or dairy items d/t concern for inflammation from them.   Patient reports that (for unknown amount of time) he was drinking 1.5 bottles of wine/night and that he  stopped drinking completely in December. Since that time, he has lost 40 lb. Talked through this as well as current medical condition and foods as they relate to RA.  Per chart review, weight yesterday was 209 lb and weight on 11/08/19 was 210 lb. Weight on 08/27/19 was 203 lb.   He states that abdominal pain has been ongoing x1 year and mainly occurred at night. It has been worsening over time and has become severe within the past 1 month. He believes that he passed out on 5/9 d/t the pain.    Labs reviewed. Medications reviewed; 40 mg oral protonix/day, 10 mg deltasone/day.  IVF; D5-1/2 NS-20 mEq KCl @ 50 ml/hr (204 kcal).     NUTRITION - FOCUSED PHYSICAL EXAM:  completed to upper body only; no muscle or fat wasting.   Diet Order:   Diet Order            Diet clear liquid Room service appropriate? Yes; Fluid consistency: Thin  Diet effective now              EDUCATION NEEDS:   Not appropriate for education at this time  Skin:  Skin Assessment: Skin Integrity Issues: Skin Integrity Issues:: Incisions Incisions: abdomen (5/10)  Last BM:  PTA/unknown  Height:   Ht Readings from Last 1 Encounters:  12/05/19 6' (1.829 m)    Weight:   Wt Readings from Last 1 Encounters:  12/05/19 94.9 kg    Ideal Body Weight:  80.9 kg  BMI:  Body mass index is 28.37 kg/m.  Estimated Nutritional Needs:   Kcal:  2180-2370 kcal  Protein:  110-120 grams  Fluid:  >/= 2.3 L/day     Jarome Matin, MS, RD, LDN, CNSC Inpatient Clinical Dietitian RD pager # available in [facility]  After hours/weekend pager # available in [facility]

## 2019-12-06 NOTE — Progress Notes (Addendum)
[place] [facility] Office:  [phone] General Surgery Progress Note   LOS: 0 days  POD -  1 Day Post-Op  Assessment and Plan: 1.  LAPAROSCOPIC UMBILICAL HERNIA REPAIR - 12/05/2019 [name]  For chronic incarcerated umbilical hernia  On clear liquids and tolerating these  2.  ATRIAL FIBRILLATION (Z86.79)  Required cardizem drip the night of surgery to control rate.  Seen by Sande Rives, PA, this AM for [facility].  Better rate today, off IV cardizem.  To transfer to floor.          He is followed at [place] - [name] (was just seen on 11/01/2019)ANTICOAGULATED (Z79.01)              3.  ARTHRITIS (M19.90)             Pain issues out of proportion to what I find on PE             seeing rheumatology - [name] 4.  Impression: On xarelto  This is on hold for surgery 5. HTN 6. Borderline sleep apnea - he is not being treated 7. On prednisone  10 mg qd 8.  DVT prophylaxis - SQ Heparin   Active Problems:   Umbilical hernia, incarcerated  Subjective:  Sore abdomen. But tolerating liquids.  No real symptoms from his A fib.  Objective:   Vitals:   12/06/19 0600 12/06/19 0800  BP: 118/77   Pulse: 94   Resp: 15   Temp:  98.4 F (36.9 C)  SpO2: 98%      Intake/Output from previous day:  05/10 0701 - 05/11 0700 In: 1870.9 [I.V.:1770.9; IV Piggyback:100] Out: 2310 [Urine:2300; Blood:10]  Intake/Output this shift:  No intake/output data recorded.   Physical Exam:   General: WN WM who is alert and oriented.    HEENT: Normal. Pupils equal. .   Lungs: Clear   Abdomen: Soft   Wound: Covered.   Lab Results:   No results for input(s): WBC, HGB, HCT, PLT in the last 72 hours.  BMET  No results for input(s): NA, K, CL, CO2, GLUCOSE, BUN, CREATININE, CALCIUM in the last 72 hours.  PT/INR  No results for input(s): LABPROT, INR in the last 72 hours.  ABG  No results for input(s): PHART, HCO3 in the last 72 hours.  Invalid input(s): PCO2,  PO2   Studies/Results:  No results found.   Anti-infectives:   Anti-infectives (From admission, onward)   Start     Dose/Rate Route Frequency Ordered Stop   12/05/19 1200  ceFAZolin (ANCEF) IVPB 2g/100 mL premix     2 g 200 mL/hr over 30 Minutes Intravenous On call to O.R. 12/05/19 1146 12/05/19 [place], MD, [facility] Office: [phone] 12/06/2019

## 2019-12-07 MED ORDER — ROSUVASTATIN CALCIUM 40 MG PO TABS
40.0000 mg | ORAL_TABLET | Freq: Every day | ORAL | 3 refills | Status: DC
Start: 2019-12-07 — End: 2020-11-12

## 2019-12-07 NOTE — Telephone Encounter (Signed)
Call to patient to review labs.    Pt verbalized understanding and has no further questions at this time.    Advised pt to call for any further questions or concerns.   Rx and labs ordered.

## 2019-12-09 ENCOUNTER — Inpatient Hospital Stay (HOSPITAL_COMMUNITY): Admission: RE | Admit: 2019-12-09 | Payer: Commercial Managed Care - PPO | Source: Ambulatory Visit

## 2019-12-12 ENCOUNTER — Inpatient Hospital Stay: Admission: RE | Admit: 2019-12-12 | Payer: Commercial Managed Care - PPO | Source: Ambulatory Visit

## 2019-12-12 ENCOUNTER — Other Ambulatory Visit (HOSPITAL_COMMUNITY): Payer: Commercial Managed Care - PPO

## 2019-12-19 ENCOUNTER — Ambulatory Visit: Payer: Commercial Managed Care - PPO | Admitting: Family

## 2019-12-19 MED ORDER — LIDOCAINE HCL 2 % EX GEL
CUTANEOUS | Status: DC
Start: ? — End: 2019-12-19

## 2019-12-19 MED ORDER — MELATONIN 3 MG PO TABS
6.00 | ORAL_TABLET | ORAL | Status: DC
Start: ? — End: 2019-12-19

## 2019-12-19 MED ORDER — TRAZODONE HCL 50 MG PO TABS
25.00 | ORAL_TABLET | ORAL | Status: DC
Start: ? — End: 2019-12-19

## 2019-12-19 MED ORDER — SOTALOL HCL 120 MG PO TABS
120.00 | ORAL_TABLET | ORAL | Status: DC
Start: 2019-12-17 — End: 2019-12-19

## 2019-12-19 MED ORDER — FOLIC ACID 1 MG PO TABS
1.00 | ORAL_TABLET | ORAL | Status: DC
Start: 2019-12-17 — End: 2019-12-19

## 2019-12-19 MED ORDER — PREDNISONE 20 MG PO TABS
40.00 | ORAL_TABLET | ORAL | Status: DC
Start: 2019-12-18 — End: 2019-12-19

## 2019-12-19 MED ORDER — METHOTREXATE 2.5 MG PO TABS
25.00 | ORAL_TABLET | ORAL | Status: DC
Start: ? — End: 2019-12-19

## 2019-12-19 MED ORDER — RIVAROXABAN 20 MG PO TABS
20.00 | ORAL_TABLET | ORAL | Status: DC
Start: 2019-12-17 — End: 2019-12-19

## 2019-12-19 MED ORDER — ACETAMINOPHEN 325 MG PO TABS
975.00 | ORAL_TABLET | ORAL | Status: DC
Start: ? — End: 2019-12-19

## 2019-12-19 MED ORDER — LIDOCAINE HCL 1 % IJ SOLN
0.50 | INTRAMUSCULAR | Status: DC
Start: ? — End: 2019-12-19

## 2019-12-19 MED ORDER — GENERIC EXTERNAL MEDICATION
Status: DC
Start: ? — End: 2019-12-19

## 2019-12-19 NOTE — Progress Notes (Deleted)
Office Visit    Patient Name: Jake Liu Date of Encounter: 12/19/2019  Primary Care Provider:  Burnard Hawthorne, FNP Primary Cardiologist:  Ida Rogue, MD Electrophysiologist:  Marzetta Board, MD   Chief Complaint    Jake Liu is a 59 y.o. male with a hx of atrial fibrillation s/p ablation (2017) on anticoagulation, HTN, TIA, OSA, coronary calcium on CT presents today for cardiac clearance for surgery.   Past Medical History    Past Medical History:  Diagnosis Date  . Anginal pain ([place])   . Arthritis   . Atrial fibrillation ([place])   . Cardiomyopathy ([place])    EF: 45%  . CHF (congestive heart failure) ([place])   . Fatty liver    borderline  . GERD (gastroesophageal reflux disease)   . Headache   . History of TIAs   . Hypertension   . Insomnia   . Sciatica   . Seronegative arthritis 09/27/2019  . Sleep apnea    No CPAP   Past Surgical History:  Procedure Laterality Date  . ABLATION N/A    for af  . COLONOSCOPY    . COLONOSCOPY WITH PROPOFOL N/A 10/13/2018   Procedure: COLONOSCOPY WITH PROPOFOL;  Surgeon: Toledo, [name], MD;  Location: [facility] ENDOSCOPY;  Service: Gastroenterology;  Laterality: N/A;  . ESOPHAGOGASTRODUODENOSCOPY    . ESOPHAGOGASTRODUODENOSCOPY (EGD) WITH PROPOFOL N/A 10/13/2018   Procedure: ESOPHAGOGASTRODUODENOSCOPY (EGD) WITH PROPOFOL;  Surgeon: Toledo, [name], MD;  Location: [facility] ENDOSCOPY;  Service: Gastroenterology;  Laterality: N/A;  . HERNIA REPAIR    . KNEE SURGERY Left   . UMBILICAL HERNIA REPAIR N/A 12/05/2019   Procedure: LAPAROSCOPIC UMBILICAL HERNIA REPAIR;  Surgeon: Alphonsa Overall, MD;  Location: [facility] ORS;  Service: General;  Laterality: N/A;    Allergies  Allergies  Allergen Reactions  . Sulfa Antibiotics Hives    rash   . Sulfasalazine Hives  . Bactrim [Sulfamethoxazole-Trimethoprim]     Sulfa/Rash  . Lisinopril     cough  . Zetia [Ezetimibe]     Joint pain    History of Present Illness    Jake Liu is a  59 y.o. male with a hx of  atrial fibrillation s/p ablation (2017) on anticoagulation, HTN, TIA, OSA, coronary calcium on CT. He was last seen 04/2018 by Dr. [name]. He follows routinely with electrophysiologist Dr. [name] of [place].  Labs via Care Everywhere 09/01/19:  Hb 13, K 4.2, Creatinine 0.6, GFR 138  ATtial fibrillation with DCCV June 2013, July 2014, ablation 2017 at [facility], and repeat DCCV earlier this year.   Previous cardiac studies include:  2013 Exercise myocardial perfusion imaging study with no significant ischemia. Normal EF 59%. No focal wall motion abnormalities. No EKG changes. Low risk study.  09/2017 echo with normal LV function, mild AI/MR/TR per report.   08/31/18 Calcium score 1228 placing him in 98th percentile per age/sex.   Chest CT 08/24/19 with notation of heart "normal in size and without evidence of right heart strain. No pericardial effusion. Severe three-vessel coronary artery atherosclerotic calcifications"  Has been having health difficulties since December 2020. Had his knee drained 4 times per his report. Had recurrent atrial fibrillation requiring cardioversion. Undergoing treatment for rheumatoid arthritis including prednisone and injections.   At last clinic visit 11/11/19 he was deemed acceptable risk for hernia repair and was also down 30 lbs which he attributed to stopping drinking in December (previously 1.5 bottles of wine per night). He was recommended to start Zetia due to  HLD and as statin had previously been discontinued by his rheumatologist.   Underwent hernia repair on 12/05/19. He did have recurrent atrial fibrillation noted preoperatively which was treated with IV Diltiazem and his home Metoprolol. He was seen by his EP Dr. [name] on 12/14/19 for follow up in atrial fibrillation with fluid retention. He was admitted for cardioversion/TEE due to missed doses of Xarelto due to surgery. Sotalol loading was completed and Metoprolol discontinued.  Rheumatology consulted during admission and his Methotrexate was increased to 25mg  weekly and started on steroid taper 5/21 due to joint pain.   Still working in [employer], therapeutic. Endorses his job is very stressful. Exercises by playing golf. He push mows his one acre lawn without difficulty. Tells me he hasn't smoked in 1.5 years.   Labs 12/17/19 via Care Everywhere  Hb 13.1, Na 138, K 4.0, creatinine 0.7, GFR 103  ***  EKGs/Labs/Other Studies Reviewed:   The following studies were reviewed today:   TEE 12/14/19: 1. Moderate mitral valve regurgitation (mainly from A1/P1 and P3 mitral valve scallops) 2. Moderate aortic valve regurgitation. 3. Mild LV dysfunction. 4. Mild pericardial effusion. 5. No left atrial or LAA thrombus. 6. Successful DC.  DCCV 12/14/19: Successful conversion to sinus rhythm following 1 shock with 200 J with 84 ohms   EKG:  EKG is ordered today.  The ekg ordered today demonstrates SR 92 bpm with single PVC and no acute ST/T wave changes. Noted minimal voltage criteria for LVH, likely normal variant.   Recent Labs: 08/06/2019: B Natriuretic Peptide 398.0; TSH 1.986 08/27/2019: Magnesium 1.7 11/28/2019: ALT 19; ALT 18; BUN 18; Creatinine, Ser 0.98; Creatinine, Ser 0.91; Hemoglobin 14.4; Platelets 387; Potassium 4.8; Sodium 138  Recent Lipid Panel    Component Value Date/Time   CHOL 255 (H) 11/28/2019 1559   TRIG 81 11/28/2019 1559   HDL 70 11/28/2019 1559   CHOLHDL 3.6 11/28/2019 1559   VLDL 16 11/28/2019 1559   LDLCALC 169 (H) 11/28/2019 1559   LDLCALC 155 (H) 08/13/2018 1501    Home Medications   No outpatient medications have been marked as taking for the 12/19/19 encounter (Appointment) with Loel Dubonnet, NP.      Review of Systems       Review of Systems  Constitution: Negative for chills, fever and malaise/fatigue.  Cardiovascular: Negative for chest pain, dyspnea on exertion, irregular heartbeat, leg swelling, near-syncope, orthopnea,  palpitations and syncope.  Respiratory: Negative for cough, shortness of breath and wheezing.   Gastrointestinal: Positive for abdominal pain. Negative for melena, nausea and vomiting.  Genitourinary: Negative for hematuria.  Neurological: Negative for dizziness, light-headedness and weakness.   All other systems reviewed and are otherwise negative except as noted above.  Physical Exam    VS:  There were no vitals taken for this visit. , BMI There is no height or weight on file to calculate BMI. GEN: Well nourished, well developed, in no acute distress. HEENT: normal. Neck: Supple, no JVD, carotid bruits, or masses. Cardiac: ***RRR, no murmurs, rubs, or gallops. No clubbing, cyanosis, edema.  Radials/DP/PT 2+ and equal bilaterally.  Respiratory:  Respirations regular and unlabored, clear to auscultation bilaterally. GI: Soft, nontender, nondistended, BS + x 4. MS: No deformity or atrophy. Skin: Warm and dry, no rash. Neuro:  Strength and sensation are intact. Psych: Normal affect.  Assessment & Plan    1. Coronary artery calcification on CT/CAD -  Calcium score 08/2018 of 1228 placing him in 98th percentile for age/sex. CT  chest 07/2019 with noted severe three-vessel coronary artery atherosclerosis. Would recommend low threshold for ischemic evaluation for anginal symptoms. ***  GDMT: No aspirin secondary to chronic anticoagulation. Continue beta blocker.   2. Paroxysmal atrial fibrillation - ***  3. Chronic anticoagulation - Secondary to PAF and CHADS2VASC of at least 2 (HTN, CAD). ***  4. HTN - ***  5. HLD, LDL goal <70 - Rheumatology stopped statin. Consider trial again in future due to elevated Ca score.*** Start Zetia 10mg  daily. Lipid panel and liver function test 6 weeks after starting. May require referral to lipid clinic.   6. Arthritis - Following with rheumatology.   Disposition: Follow up in 4 month(s) with Dr. [name] or APP.    Loel Dubonnet, NP 12/19/2019, 8:01  AM

## 2019-12-20 ENCOUNTER — Encounter: Payer: Self-pay | Admitting: Family

## 2019-12-24 ENCOUNTER — Other Ambulatory Visit: Payer: Self-pay | Admitting: Family

## 2019-12-24 DIAGNOSIS — K219 Gastro-esophageal reflux disease without esophagitis: Secondary | ICD-10-CM

## 2020-01-31 ENCOUNTER — Ambulatory Visit: Payer: Commercial Managed Care - PPO | Admitting: Gastroenterology

## 2020-03-10 NOTE — Progress Notes (Signed)
Cardiology Office Note  Date:  03/12/2020   ID:  Jake Liu, DOB [birth year], MRN [id number]  PCP:  Burnard Hawthorne, FNP   Chief Complaint  Patient presents with  . other    4 month follow up. Meds reviewed by the pt. verbally. Pt. c/o swelling in ankles.     HPI:  Jake Liu is a 59 year old gentleman with past medical history of Atrial fibrillation, cardioversion June 2013, repeat July 2014, Normal ejection fraction on transesophageal echo July 2014 History of ablation, 2017 Smokes cigars Hypertension TIA Stress at work/anxiety, drinks wine Obstructive sleep apnea Coronary calcium score of 1228. In 2020 seronegative RA followed by rheumatology. Alcohol Who presents for follow-up of his hypertension, atrial fibrillation  Last seen by myself in clinic October 2019 Has been seen by the providers in our office for preop cardiovascular evaluation April 2021 Recent hospitalization May 2021 Had hernia repair Before getting into the hospital had gone into atrial fibrillation presumably secondary to the pain Placed on Cardizem infusion, metoprolol increased  In hospital [facility]: [facility] records reviewed in detail with him on today's visit cardioversion metoprolol to sotalol On xarelto  Seen by [facility] cardiology as outpatient last month, treadmill stress echo ordered, has not been completed yet  Lab work reviewed with him  HAB1C 5.4 Total chol 255  Worsening leg swelling, chronic issue, may be worse in the past several weeks, has been driving for long hours with no stopping  Does not know what medications he is taking, unclear if he is on Zetia or Crestor  EKG personally reviewed by myself on todays visit Shows normal sinus rhythm rate 65 bpm no significant ST-T wave changes  Past medical history reviewed Echo 09/2017, outside facility Normal LV function, no significant valvular disease Dilated aortic root, 3.9 cm  Carotid Doppler, normal 2014  2013,   Myoview showing no ischemia   PMH:   has a past medical history of Anginal pain ([place]), Arthritis, Atrial fibrillation ([place]), Cardiomyopathy ([place]), CHF (congestive heart failure) ([place]), Fatty liver, GERD (gastroesophageal reflux disease), Headache, History of TIAs, Hypertension, Insomnia, Sciatica, Seronegative arthritis (09/27/2019), and Sleep apnea.  PSH:    Past Surgical History:  Procedure Laterality Date  . ABLATION N/A    for af  . COLONOSCOPY    . COLONOSCOPY WITH PROPOFOL N/A 10/13/2018   Procedure: COLONOSCOPY WITH PROPOFOL;  Surgeon: Toledo, [name], MD;  Location: [facility] ENDOSCOPY;  Service: Gastroenterology;  Laterality: N/A;  . ESOPHAGOGASTRODUODENOSCOPY    . ESOPHAGOGASTRODUODENOSCOPY (EGD) WITH PROPOFOL N/A 10/13/2018   Procedure: ESOPHAGOGASTRODUODENOSCOPY (EGD) WITH PROPOFOL;  Surgeon: Toledo, [name], MD;  Location: [facility] ENDOSCOPY;  Service: Gastroenterology;  Laterality: N/A;  . HERNIA REPAIR    . KNEE SURGERY Left   . UMBILICAL HERNIA REPAIR N/A 12/05/2019   Procedure: LAPAROSCOPIC UMBILICAL HERNIA REPAIR;  Surgeon: Alphonsa Overall, MD;  Location: [facility];  Service: General;  Laterality: N/A;    Current Outpatient Medications  Medication Sig Dispense Refill  . amLODipine (NORVASC) 5 MG tablet TAKE 1 TABLET(5 MG) BY MOUTH DAILY (Patient taking differently: Take 5 mg by mouth at bedtime. ) 90 tablet 4  . ezetimibe (ZETIA) 10 MG tablet Take 1 tablet (10 mg total) by mouth daily. 90 tablet 1  . folic acid (FOLVITE) 1 MG tablet Take 1 mg by mouth every morning.    . methotrexate 2.5 MG tablet Take 15 mg by mouth every Wednesday.    . predniSONE (DELTASONE) 20 MG tablet Take 10-40 mg  by mouth See admin instructions. Tapering dose down:40 mg daily until 5/7, 30 mg daily on 05/8, 20 mg daily on 05/09    . rivaroxaban (XARELTO) 20 MG TABS tablet Take 20 mg by mouth at bedtime.     . rosuvastatin (CRESTOR) 40 MG tablet Take 1 tablet (40 mg total) by mouth daily. 90 tablet 3  .  sotalol (BETAPACE) 120 MG tablet Take 120 mg by mouth daily.     Marland Kitchen HYDROcodone-acetaminophen (NORCO/VICODIN) 5-325 MG tablet Take 1 tablet by mouth every 6 (six) hours as needed for moderate pain. (Patient not taking: Reported on 03/12/2020) 15 tablet 0   No current facility-administered medications for this visit.    Allergies:   Sulfa antibiotics, Sulfasalazine, Bactrim [sulfamethoxazole-trimethoprim], Lisinopril, and Zetia [ezetimibe]   Social History:  The patient  reports that he has quit smoking. He has never used smokeless tobacco. He reports previous alcohol use of about 14.0 standard drinks of alcohol per week. He reports that he does not use drugs.   Family History:   family history includes Asthma in his father; Heart disease (age of onset: 61) in his mother.    Review of Systems: Review of Systems  Constitutional: Negative.   Respiratory: Negative.   Cardiovascular: Negative.   Gastrointestinal: Negative.   Musculoskeletal: Negative.   Neurological: Negative.   Psychiatric/Behavioral: Negative.   All other systems reviewed and are negative.    PHYSICAL EXAM: VS:  BP 130/70 (BP Location: Left Arm, Patient Position: Sitting, Cuff Size: Normal)   Pulse 65   Ht 6' (1.829 m)   Wt 213 lb (96.6 kg)   SpO2 98%   BMI 28.89 kg/m  , BMI Body mass index is 28.89 kg/m. Constitutional:  oriented to person, place, and time. No distress.  HENT:  Head: Normocephalic and atraumatic.  Eyes:  no discharge. No scleral icterus.  Neck: Normal range of motion. Neck supple. No JVD present.  Cardiovascular: Normal rate, regular rhythm, normal heart sounds and intact distal pulses. Exam reveals no gallop and no friction rub. No edema No murmur heard. Pulmonary/Chest: Effort normal and breath sounds normal. No stridor. No respiratory distress.  no wheezes.  no rales.  no tenderness.  Abdominal: Soft.  no distension.  no tenderness.  Musculoskeletal: Normal range of motion.  no  tenderness  or deformity.  Neurological:  normal muscle tone. Coordination normal. No atrophy Skin: Skin is warm and dry. No rash noted. not diaphoretic.  Psychiatric:  normal mood and affect. behavior is normal. Thought content normal.     Recent Labs: 08/06/2019: B Natriuretic Peptide 398.0; TSH 1.986 08/27/2019: Magnesium 1.7 11/28/2019: ALT 19; ALT 18; BUN 18; Creatinine, Ser 0.98; Creatinine, Ser 0.91; Hemoglobin 14.4; Platelets 387; Potassium 4.8; Sodium 138   Lipid Panel Lab Results  Component Value Date   CHOL 255 (H) 11/28/2019   HDL 70 11/28/2019   LDLCALC 169 (H) 11/28/2019   TRIG 81 11/28/2019      Wt Readings from Last 3 Encounters:  03/12/20 213 lb (96.6 kg)  12/05/19 209 lb 3.5 oz (94.9 kg)  12/01/19 208 lb (94.3 kg)      ASSESSMENT AND PLAN:  Atrial fibrillation status post cardioversion (HCC) Maintaining normal sinus rhythm on sotalol and Xarelto Complains about the bruising No changes made  Coronary disease with stable angina Heavy coronary calcification, Would likely have significant difficulty getting heart rate up to target on his sotalol, Does not think he can treadmill, has significant ankle swelling We will change  to [place] hypertension Hold the amlodipine given leg swelling Start losartan 50 mg daily  Obstructive sleep apnea Documented in the notes, recommended weight loss Needs follow-up with primary care, may need sleep study  GAD (generalized anxiety disorder) Very stressful job Recommend regular walking program Avoid alcohol  Mixed hyperlipidemia He is unsure what medications he is taking Recommend he call our office to let us know if he is taking either Crestor or Zetia or both   Total encounter time more than 25 minutes  Greater than 50% was spent in counseling and coordination of care with the patient   Orders Placed This Encounter  Procedures  . EKG 12-Lead     Signed, Esmond Plants, M.D., Ph.D. 03/12/2020  [place], [place]

## 2020-03-12 ENCOUNTER — Encounter: Payer: Self-pay | Admitting: Cardiovascular Disease

## 2020-03-12 ENCOUNTER — Ambulatory Visit: Payer: Commercial Managed Care - PPO | Admitting: Cardiovascular Disease

## 2020-03-12 ENCOUNTER — Other Ambulatory Visit: Payer: Self-pay

## 2020-03-12 VITALS — BP 130/70 | HR 65 | Ht 72.0 in | Wt 213.0 lb

## 2020-03-12 DIAGNOSIS — G4733 Obstructive sleep apnea (adult) (pediatric): Secondary | ICD-10-CM | POA: Diagnosis not present

## 2020-03-12 DIAGNOSIS — I480 Paroxysmal atrial fibrillation: Secondary | ICD-10-CM

## 2020-03-12 DIAGNOSIS — I2510 Atherosclerotic heart disease of native coronary artery without angina pectoris: Secondary | ICD-10-CM | POA: Diagnosis not present

## 2020-03-12 DIAGNOSIS — I10 Essential (primary) hypertension: Secondary | ICD-10-CM | POA: Diagnosis not present

## 2020-03-12 MED ORDER — LOSARTAN POTASSIUM 50 MG PO TABS
50.0000 mg | ORAL_TABLET | Freq: Every day | ORAL | 3 refills | Status: DC
Start: 2020-03-12 — End: 2020-09-07

## 2020-03-12 NOTE — Patient Instructions (Addendum)
Call the office, Let me know which cholesterol medication you are now  Medication Instructions:  Your physician has recommended you make the following change in your medication:  1. STOP Amlodipine 2. START Losartan 50 mg once daily    If you need a refill on your cardiac medications before your next appointment, please call your pharmacy.    Lab work: No new labs needed   If you have labs (blood work) drawn today and your tests are completely normal, you will receive your results only by: Marland Kitchen MyChart Message (if you have MyChart) OR . A paper copy in the mail If you have any lab test that is abnormal or we need to change your treatment, we will call you to review the results.   Testing/Procedures: [place]  Your caregiver has ordered a Stress Test with nuclear imaging. The purpose of this test is to evaluate the blood supply to your heart muscle. This procedure is referred to as a "Non-Invasive Stress Test." This is because other than having an IV started in your vein, nothing is inserted or "invades" your body. Cardiac stress tests are done to find areas of poor blood flow to the heart by determining the extent of coronary artery disease (CAD). Some patients exercise on a treadmill, which naturally increases the blood flow to your heart, while others who are  unable to walk on a treadmill due to physical limitations have a pharmacologic/chemical stress agent called Lexiscan . This medicine will mimic walking on a treadmill by temporarily increasing your coronary blood flow.   Please note: these test may take anywhere between 2-4 hours to complete  PLEASE REPORT TO [place] AT THE FIRST DESK WILL DIRECT YOU WHERE TO GO  Date of Procedure:_____________________________________  Arrival Time for Procedure:______________________________   PLEASE NOTIFY THE OFFICE AT LEAST 24 HOURS IN ADVANCE IF YOU ARE UNABLE TO KEEP YOUR APPOINTMENT.   [phone] AND  PLEASE NOTIFY NUCLEAR MEDICINE AT [facility] AT LEAST 24 HOURS IN ADVANCE IF YOU ARE UNABLE TO KEEP YOUR APPOINTMENT. [phone]  How to prepare for your Myoview test:  1. Do not eat or drink after midnight 2. No caffeine for 24 hours prior to test 3. No smoking 24 hours prior to test. 4. Your medication may be taken with water.  If your doctor stopped a medication because of this test, do not take that medication. 5. Ladies, please do not wear dresses.  Skirts or pants are appropriate. Please wear a short sleeve shirt. 6. No perfume, cologne or lotion. 7. Wear comfortable walking shoes. No heels!   Follow-Up: At [facility], you and your health needs are our priority.  As part of our continuing mission to provide you with exceptional heart care, we have created designated Provider Care Teams.  These Care Teams include your primary Cardiologist (physician) and Advanced Practice Providers (APPs -  Physician Assistants and Nurse Practitioners) who all work together to provide you with the care you need, when you need it.  . You will need a follow up appointment in 6 months   . Providers on your designated Care Team:   . Murray Hodgkins, NP . Christell Faith, PA-C . Marrianne Mood, PA-C  Any Other Special Instructions Will Be Listed Below (If Applicable).  COVID-19 Vaccine Information can be found at: ShippingScam.co.uk For questions related to vaccine distribution or appointments, please email vaccine@[email or web address] or call [phone].

## 2020-03-19 ENCOUNTER — Telehealth: Payer: Self-pay | Admitting: Cardiovascular Disease

## 2020-03-19 NOTE — Telephone Encounter (Signed)
Needs both crestor and zetia Cholesterol was high

## 2020-03-19 NOTE — Telephone Encounter (Signed)
Please call to review medication list.

## 2020-03-19 NOTE — Telephone Encounter (Signed)
Spoke with patient and he was requested to call back for review of his medications. Dr. [name] wanted an updated list due to not being aware of other medications while at visit.   Patient is taking the medications as follows:  1. Xarelto 20 mg once daily at bedtime 2. Sotalol 120 mg every 12 hours  3. Folic Acid Once daily 4. Methotrexate 2.5 mg takes 8 pills once every Wednesday 5. Prednisone 10 mg once daily 6. Rosuvastatin 40 mg once daily 7. Amlodipine 5 mg once daily  Differences between current medication list is that he is not taking Zetia, no norco, and Sotalol is ordered once daily and he is taking it twice daily. Reviewed that at last visit he was to discontinue amlodipine and start Losartan 50 mg once daily due to lower extremity swelling. He reports that he will go today to pick up new prescription. He verbalized understanding of our conversation and let him know that if provider had other recommendations we would give him a call back. He was appreciative for the call with no further questions.

## 2020-03-20 ENCOUNTER — Ambulatory Visit: Admission: RE | Admit: 2020-03-20 | Payer: Commercial Managed Care - PPO | Source: Ambulatory Visit

## 2020-03-20 MED ORDER — EZETIMIBE 10 MG PO TABS: 10.0000 mg | ORAL_TABLET | Freq: Every day | ORAL | 3 refills | Status: DC

## 2020-03-20 NOTE — Telephone Encounter (Signed)
Spoke with patient and reviewed provider recommendations and that there are lab orders entered for repeat testing. Our line was disconnected twice and then after that it goes straight to voicemail. Left detailed messages that [facility]. Then called back later and left voicemail message to make sure he is fasting for those to be done and call back if any further questions.

## 2020-03-28 ENCOUNTER — Other Ambulatory Visit: Payer: Self-pay | Admitting: Family

## 2020-03-28 DIAGNOSIS — I10 Essential (primary) hypertension: Secondary | ICD-10-CM

## 2020-09-06 NOTE — Progress Notes (Signed)
Office Visit    Patient Name: Jake Liu Date of Encounter: 09/07/2020  PCP:  Burnard Hawthorne, [facility]  Cardiologist:  Ida Rogue, MD  Advanced Practice Provider:  Loel Dubonnet, NP Electrophysiologist:  Marzetta Board, MD   Chief Complaint    Jake Liu is a 60 y.o. male with a hx of atrial fibrillation s/p ablation (2017) on anticoagulation, HTN, TIA, OSA, coronary calcium on CT presents today for fatigue.   Past Medical History    Past Medical History:  Diagnosis Date  . Anginal pain ([place])   . Arthritis   . Atrial fibrillation ([place])   . Cardiomyopathy ([place])    EF: 45%  . CHF (congestive heart failure) ([place])   . Fatty liver    borderline  . GERD (gastroesophageal reflux disease)   . Headache   . History of TIAs   . Hypertension   . Insomnia   . Sciatica   . Seronegative arthritis 09/27/2019  . Sleep apnea    No CPAP   Past Surgical History:  Procedure Laterality Date  . ABLATION N/A    for af  . COLONOSCOPY    . COLONOSCOPY WITH PROPOFOL N/A 10/13/2018   Procedure: COLONOSCOPY WITH PROPOFOL;  Surgeon: Toledo, [name], MD;  Location: [facility] ENDOSCOPY;  Service: Gastroenterology;  Laterality: N/A;  . ESOPHAGOGASTRODUODENOSCOPY    . ESOPHAGOGASTRODUODENOSCOPY (EGD) WITH PROPOFOL N/A 10/13/2018   Procedure: ESOPHAGOGASTRODUODENOSCOPY (EGD) WITH PROPOFOL;  Surgeon: Toledo, [name], MD;  Location: [facility] ENDOSCOPY;  Service: Gastroenterology;  Laterality: N/A;  . HERNIA REPAIR    . KNEE SURGERY Left   . UMBILICAL HERNIA REPAIR N/A 12/05/2019   Procedure: LAPAROSCOPIC UMBILICAL HERNIA REPAIR;  Surgeon: Alphonsa Overall, MD;  Location: [facility];  Service: General;  Laterality: N/A;    Allergies  Allergies  Allergen Reactions  . Sulfa Antibiotics Hives    rash   . Sulfasalazine Hives  . Bactrim [Sulfamethoxazole-Trimethoprim]     Sulfa/Rash  . Lisinopril     cough  . Zetia [Ezetimibe]     Joint pain    History  of Present Illness    Jake Liu is a 60 y.o. male with a hx of atrial fibrillation s/p ablation (2017) on anticoagulation, HTN, TIA, OSA, coronary calcium on CT, umbilical hernia repair 11/19/93 last seen 03/12/20. He follows with Dr. [name] of [facility] electrophysiology.   Atrial fibrillation with DCCV June 2013, July 2014, ablation 2017 at [facility], and repeat DCCV 06/2019, 11/2019.   Previous cardiac studies include:  2013 Exercise myocardial perfusion imaging study with no significant ischemia. Normal EF 59%. No focal wall motion abnormalities. No EKG changes. Low risk study.  09/2017 echo with normal LV function, mild AI/MR/TR per report.   08/31/18 Calcium score 1228 placing him in 98th percentile per age/sex.   Chest CT 08/24/19 with notation of heart "normal in size and without evidence of right heart strain. No pericardial effusion. Severe three-vessel coronary artery atherosclerotic calcifications"  Seen 11/11/19 with no anginal symptoms and cleared for hernia repair. Underwent laparoscopic hernia repair 12/05/19. Was noted to be in afib though proceeded with surgery which was successful. He was read[facility] to [facility] 12/13/19 after presenting with fatigue, lightheadedness, syncopal episode. He was transitioned from Metoprolol to Sotalol. Underwent cardioversion and TEE 12/14/19. TEE showed moderate MR, moderate AI, mild LV dysfunction. He was discharged on Amlodipine due to elevated BP. He was recommended for outpatient exercise myoview in the setting of chest  discomfort and known coronary calcification.   Seen in follow up by Dr. [name] 12/2019 maintaining NSR. No changes were made at that time.   He presents today for follow up. He works in [employer], therapeutic. Endorses his job is very stressful. Reports a few week history of increasing fatigue and dyspnea on exertion. Denies palpitations, lightheadedness, dizziness. Reports an occasional left sided chest discomfort not associated with diaphoresis nor  shortness of breath that occurs in stressful situations. EKG today shows he is in recurrent atrial fibrillation. Endorses going out with clients and drinking multiple glasses of wine as well as increased caffeine intake. No formal exercise routine but has a Peloton he is hopeful to start using though feels too fatigued at this time.   EKGs/Labs/Other Studies Reviewed:   The following studies were reviewed today:   TEE 12/14/19: 1. Moderate mitral valve regurgitation (mainly from A1/P1 and P3 mitral valve scallops) 2. Moderate aortic valve regurgitation. 3. Mild LV dysfunction. 4. Mild pericardial effusion. 5. No left atrial or LAA thrombus. 6. Successful DC.    EKG:  EKG is  ordered today.  The ekg ordered today demonstrates atrial fibrillation 98 bpm with no acute ST/T wave changes.   Recent Labs: 11/28/2019: ALT 19; ALT 18; BUN 18; Creatinine, Ser 0.98; Creatinine, Ser 0.91; Hemoglobin 14.4; Platelets 387; Potassium 4.8; Sodium 138  Recent Lipid Panel    Component Value Date/Time   CHOL 255 (H) 11/28/2019 1559   TRIG 81 11/28/2019 1559   HDL 70 11/28/2019 1559   CHOLHDL 3.6 11/28/2019 1559   VLDL 16 11/28/2019 1559   LDLCALC 169 (H) 11/28/2019 1559   LDLCALC 155 (H) 08/13/2018 1501    Home Medications   Current Meds  Medication Sig  . Adalimumab 40 MG/0.4ML PNKT Inject into the skin. Twice a month  . folic acid (FOLVITE) 1 MG tablet Take 1 mg by mouth every morning.  . methotrexate 2.5 MG tablet Take 15 mg by mouth every Wednesday.  . rivaroxaban (XARELTO) 20 MG TABS tablet Take 20 mg by mouth at bedtime.   . rosuvastatin (CRESTOR) 40 MG tablet Take 1 tablet (40 mg total) by mouth daily.  . sotalol (BETAPACE) 120 MG tablet Take 120 mg by mouth 2 (two) times daily.    Review of Systems   Review of Systems  Constitutional: Positive for malaise/fatigue. Negative for chills and fever.  Cardiovascular: Positive for chest pain and dyspnea on exertion. Negative for irregular  heartbeat, leg swelling, near-syncope, orthopnea, palpitations and syncope.  Respiratory: Negative for cough, shortness of breath and wheezing.   Gastrointestinal: Negative for melena, nausea and vomiting.  Genitourinary: Negative for hematuria.  Neurological: Negative for dizziness, light-headedness and weakness.   All other systems reviewed and are otherwise negative except as noted above.  Physical Exam    VS:  BP (!) 142/92 (BP Location: Left Arm, Patient Position: Sitting, Cuff Size: Normal)   Pulse 98   Ht 6' (1.829 m)   Wt 226 lb (102.5 kg)   SpO2 98%   BMI 30.65 kg/m  , BMI Body mass index is 30.65 kg/m.  Wt Readings from Last 3 Encounters:  09/07/20 226 lb (102.5 kg)  03/12/20 213 lb (96.6 kg)  12/05/19 209 lb 3.5 oz (94.9 kg)    GEN: Well nourished, well developed, in no acute distress. HEENT: normal. Neck: Supple, no JVD, carotid bruits, or masses. Cardiac: irregularly irregular and tachycardic, no murmurs, rubs, or gallops. No clubbing, cyanosis, edema.  Radials/DP/PT 2+ and  equal bilaterally.  Respiratory:  Respirations regular and unlabored, clear to auscultation bilaterally. GI: Soft, nontender, nondistended. MS: No deformity or atrophy. Skin: Warm and dry, no rash. Neuro:  Strength and sensation are intact. Psych: Normal affect.  Assessment & Plan    1. Persistent atrial fibrillation - Ablation 2017 at [facility]. Repeat DCCV 06/2019 and 11/2019. During hospitalization 11/2019 he was transitioned from Metoprolol to Sotalol by [facility] EP team and continues to follow with Dr. [name] of [facility]. EKG today shows atrial fib with RVR rate 115bpm. He is symptomatic with fatigue, dyspnea on exertion. Recurrent atrial fibrillation cause likely multifactorial - he has been taking Sotalol QD instead of BID as prescribed, endorses significant alcohol use as well as high stress and caffeine intake. CMP, CBC, magnesium, TSH today. Educated to increase Sotalol to BID as prescribed by Dr.  [name]. Will route my note to Dr. [name] of EP and fax EKG to their office as well. Jake Liu will need prompt follow up with their office.   2. Chronic anticoagulation - Secondary to atrial fibrillation and CHADS2VASc of at least 2 (HTN, CAD). Denies bleeding complications. Continue Xarelto 20mg  daily.   3. HTN - BP not controlled. Amlodipine previously discontinued due to LE edema which resolved. Start Losartan 50mg  daily.  4. Coronary artery calcification - CT chest with extensive 3 vessel coronary calcification. Reports occasional left sided chest discomfort that occurs with stress and is not associated with diaphoresis nor shortness of breath. Would benefit from ischemic evaluation either by cardiac CTA or lexiscan. Previously ordered exercise myoview by [facility] cardiology though Dr. [name] recommended lexiscan myoview due to his Sotalol making it unlikely to elevate his HR to goal. EKG today with no acute ST/ T wave changes. Educated to report to ED for new or worsening chest pain. Will discuss ischemic evaluation at follow up in one month in order to prioritize restoration of NSR, as above.   Disposition: Follow up in 1 month(s) with Dr. [name] or APP.    Signed, Loel Dubonnet, NP 09/07/2020, 10:11 AM [place]

## 2020-09-07 ENCOUNTER — Encounter: Payer: Self-pay | Admitting: Medical

## 2020-09-07 ENCOUNTER — Other Ambulatory Visit: Payer: Self-pay

## 2020-09-07 ENCOUNTER — Ambulatory Visit (INDEPENDENT_AMBULATORY_CARE_PROVIDER_SITE_OTHER): Payer: Commercial Managed Care - PPO | Admitting: Family

## 2020-09-07 VITALS — BP 142/92 | HR 98 | Ht 72.0 in | Wt 226.0 lb

## 2020-09-07 DIAGNOSIS — I2510 Atherosclerotic heart disease of native coronary artery without angina pectoris: Secondary | ICD-10-CM

## 2020-09-07 DIAGNOSIS — I10 Essential (primary) hypertension: Secondary | ICD-10-CM

## 2020-09-07 DIAGNOSIS — G4733 Obstructive sleep apnea (adult) (pediatric): Secondary | ICD-10-CM | POA: Diagnosis not present

## 2020-09-07 DIAGNOSIS — Z7901 Long term (current) use of anticoagulants: Secondary | ICD-10-CM

## 2020-09-07 DIAGNOSIS — I480 Paroxysmal atrial fibrillation: Secondary | ICD-10-CM

## 2020-09-07 DIAGNOSIS — E785 Hyperlipidemia, unspecified: Secondary | ICD-10-CM

## 2020-09-07 DIAGNOSIS — I4819 Other persistent atrial fibrillation: Secondary | ICD-10-CM | POA: Diagnosis not present

## 2020-09-07 MED ORDER — LOSARTAN POTASSIUM 50 MG PO TABS: 50.0000 mg | ORAL_TABLET | Freq: Every day | ORAL | 3 refills | Status: DC

## 2020-09-07 NOTE — Patient Instructions (Signed)
Medication Instructions:  Your provider has recommended you make the following change in your medication:   CHANGE Sotalol to 120mg  twice daily  START Losartan 50mg  daily  *If you need a refill on your cardiac medications before your next appointment, please call your pharmacy*   Lab Work: Your provider recommends lab work today: CMP, CBC, magnesium, TSH  If you have labs (blood work) drawn today and your tests are completely normal, you will receive your results only by: Marland Kitchen MyChart Message (if you have MyChart) OR . A paper copy in the mail If you have any lab test that is abnormal or we need to change your treatment, we will call you to review the results.  Testing/Procedures: Your EKG today shows atrial fibrillation. Recommend you call Dr. [name] of [facility] to let him know.   Follow-Up: At [facility], you and your health needs are our priority.  As part of our continuing mission to provide you with exceptional heart care, we have created designated Provider Care Teams.  These Care Teams include your primary Cardiologist (physician) and Advanced Practice Providers (APPs -  Physician Assistants and Nurse Practitioners) who all work together to provide you with the care you need, when you need it.  We recommend signing up for the patient portal called "MyChart".  Sign up information is provided on this After Visit Summary.  MyChart is used to connect with patients for Virtual Visits (Telemedicine).  Patients are able to view lab/test results, encounter notes, upcoming appointments, etc.  Non-urgent messages can be sent to your provider as well.   To learn more about what you can do with MyChart, go to [email or web address].    Your next appointment:   1 month(s)  The format for your next appointment:   In Person  Provider:   You may see Ida Rogue, MD or one of the following Advanced Practice Providers on your designated Care Team:    Murray Hodgkins, NP  Christell Faith,  PA-C  Marrianne Mood, PA-C  Cadence Browning, [place]  Laurann [place], NP  Other Instructions  We will discuss a possible stress test as previously recommended at your next appointment. We will focus on getting your atrial fibrillation under control prior to this next step.

## 2020-09-08 LAB — COMPREHENSIVE METABOLIC PANEL
ALT: 21 IU/L (ref 0–44)
AST: 20 IU/L (ref 0–40)
Albumin/Globulin Ratio: 1.5 (ref 1.2–2.2)
Albumin: 4 g/dL (ref 3.8–4.9)
Alkaline Phosphatase: 59 IU/L (ref 44–121)
BUN/Creatinine Ratio: 23 (ref 10–24)
BUN: 13 mg/dL (ref 8–27)
Bilirubin Total: 0.8 mg/dL (ref 0.0–1.2)
CO2: 20 mmol/L (ref 20–29)
Calcium: 9.3 mg/dL (ref 8.6–10.2)
Chloride: 102 mmol/L (ref 96–106)
Creatinine, Ser: 0.56 mg/dL — ABNORMAL LOW (ref 0.76–1.27)
GFR calc Af Amer: 130 mL/min/{1.73_m2} (ref >59)
GFR calc non Af Amer: 112 mL/min/{1.73_m2} (ref >59)
Globulin, Total: 2.6 g/dL (ref 1.5–4.5)
Glucose: 111 mg/dL — ABNORMAL HIGH (ref 65–99)
Potassium: 4.6 mmol/L (ref 3.5–5.2)
Sodium: 138 mmol/L (ref 134–144)
Total Protein: 6.6 g/dL (ref 6.0–8.5)

## 2020-09-08 LAB — CBC
Hematocrit: 44.3 % (ref 37.5–51.0)
Hemoglobin: 14.8 g/dL (ref 13.0–17.7)
MCH: 31 pg (ref 26.6–33.0)
MCHC: 33.4 g/dL (ref 31.5–35.7)
MCV: 93 fL (ref 79–97)
Platelets: 253 10*3/uL (ref 150–450)
RBC: 4.78 x10E6/uL (ref 4.14–5.80)
RDW: 13.4 % (ref 11.6–15.4)
WBC: 6.2 10*3/uL (ref 3.4–10.8)

## 2020-09-08 LAB — TSH: TSH: 2.64 u[IU]/mL (ref 0.450–4.500)

## 2020-09-08 LAB — MAGNESIUM: Magnesium: 1.9 mg/dL (ref 1.6–2.3)

## 2020-09-11 ENCOUNTER — Telehealth: Payer: Self-pay | Admitting: *Deleted

## 2020-09-11 NOTE — Telephone Encounter (Signed)
Attempted to call pt to review results. No answer. Vm is full.

## 2020-09-11 NOTE — Telephone Encounter (Signed)
-----   Message from Loel Dubonnet, NP sent at 09/10/2020  9:56 AM EST ----- Stable kidney function. Normal electrolytes including magnesium. Normal thyroid function. CBC with no evidence of anemia nor infection. Good result!

## 2020-09-12 ENCOUNTER — Ambulatory Visit: Payer: Commercial Managed Care - PPO | Admitting: Cardiovascular Disease

## 2020-09-19 NOTE — Telephone Encounter (Signed)
The patient has been notified of the result and verbalized understanding.  All questions (if any) were answered.     

## 2020-10-10 ENCOUNTER — Ambulatory Visit: Payer: Commercial Managed Care - PPO | Admitting: Cardiovascular Disease

## 2020-10-30 ENCOUNTER — Encounter: Payer: Self-pay | Admitting: Family

## 2020-10-30 ENCOUNTER — Telehealth (INDEPENDENT_AMBULATORY_CARE_PROVIDER_SITE_OTHER): Payer: Commercial Managed Care - PPO | Admitting: Family

## 2020-10-30 ENCOUNTER — Other Ambulatory Visit (INDEPENDENT_AMBULATORY_CARE_PROVIDER_SITE_OTHER): Payer: Commercial Managed Care - PPO

## 2020-10-30 ENCOUNTER — Telehealth: Payer: Self-pay | Admitting: Family

## 2020-10-30 VITALS — Ht 72.0 in | Wt 215.0 lb

## 2020-10-30 DIAGNOSIS — J029 Acute pharyngitis, unspecified: Secondary | ICD-10-CM | POA: Diagnosis not present

## 2020-10-30 DIAGNOSIS — J40 Bronchitis, not specified as acute or chronic: Secondary | ICD-10-CM

## 2020-10-30 LAB — POCT RAPID STREP A (OFFICE): Rapid Strep A Screen: NEGATIVE

## 2020-10-30 MED ORDER — BENZONATATE 100 MG PO CAPS: 100.0000 mg | ORAL_CAPSULE | Freq: Three times a day (TID) | ORAL | 1 refills | Status: DC | PRN

## 2020-10-30 MED ORDER — GUAIFENESIN ER 600 MG PO TB12: 1200.0000 mg | ORAL_TABLET | Freq: Two times a day (BID) | ORAL | 0 refills | Status: DC

## 2020-10-30 NOTE — Assessment & Plan Note (Signed)
Afebrile. No acute respiratory distress. Advised most likely viral URI and pleased he has had improvement. Pending flu, covid, strep test. We agreed conservative therapy is most appropriate, start tessalon, mucinex. He will let me know how he is doing

## 2020-10-30 NOTE — Telephone Encounter (Signed)
Unable to contact patient to schedule 62m follow up with Arnett, vb full.

## 2020-10-30 NOTE — Progress Notes (Signed)
Verbal consent for services obtained from patient prior to services given to TELEPHONE visit:   Location of call:  provider at work patient at home  Names of all persons present for services: [name], NP and patient  Chief complaint: Cough 5 days, slight improvement.  Endorses sore throat, tactile warmth, facial pressure.  No chills, sob, HA, facial swelling, cp, palpitations.  H/o seasonal allergies He was at a convention last week in [place]  Has tried nyquil and sleeping well.   He is not allergic to PCN.   H/o atrial fib  He is covid vaccinated, 2 doses without booster.     A/P/next steps:  Problem List Items Addressed This Visit      Respiratory   Bronchitis    Afebrile. No acute respiratory distress. Advised most likely viral URI and pleased he has had improvement. Pending flu, covid, strep test. We agreed conservative therapy is most appropriate, start tessalon, mucinex. He will let me know how he is doing       Other Visit Diagnoses    Sore throat    -  Primary   Relevant Medications   guaiFENesin (MUCINEX) 600 MG 12 hr tablet   benzonatate (TESSALON PERLES) 100 MG capsule   Other Relevant Orders   Novel Coronavirus, NAA ([facility])   POCT rapid strep A       I spent 15 min  discussing plan of care over the phone.

## 2020-10-31 ENCOUNTER — Telehealth: Payer: Self-pay | Admitting: Family

## 2020-10-31 DIAGNOSIS — U071 COVID-19: Secondary | ICD-10-CM

## 2020-10-31 MED ORDER — HYDROCOD POLST-CPM POLST ER 10-8 MG/5ML PO SUER: 5.0000 mL | Freq: Every evening | ORAL | 0 refills | Status: DC | PRN

## 2020-10-31 NOTE — Telephone Encounter (Signed)
Pt stated that he is in the window just noticing his symptoms & became sick Monday. He would really like to be referred for infusion asap. He said that his chest is tight from the amount of coughing that he is doing. He is also spitting up thick green mucus. I told him that I would ask you about referral for infusion, but that if he felt like he could not catch his breath or SOB he needed to go to ED. Pt will have someone pick up cough syrup for him bc he does not want to drive due to feeling dizzy. He stated that friends at the same convention as him are having infusion.

## 2020-10-31 NOTE — Telephone Encounter (Signed)
Please advise 

## 2020-10-31 NOTE — Telephone Encounter (Signed)
Patient was returning call about covid

## 2020-10-31 NOTE — Telephone Encounter (Signed)
Jake Liu I have referred pt Please let him know.  Please ask him to call us if he doesn't hear from them in the next couple of days. Due to his vaccination status, there is a change he  May not qualify.   Again if he develops SOB, he needs to go to ed

## 2020-10-31 NOTE — Telephone Encounter (Signed)
Pt called he said that he took an at home covid test and it was positive  Pt said that his chest is hurting from coughing so much

## 2020-10-31 NOTE — Addendum Note (Signed)
Addended by: Burnard Hawthorne on: 10/31/2020 04:09 PM   Modules accepted: Orders

## 2020-10-31 NOTE — Telephone Encounter (Signed)
Pt called and advised on below. He will let us know if he does not hear in regards to infusion. He also knows that he should go to ED ASAP if he becomes SOB.

## 2020-10-31 NOTE — Telephone Encounter (Signed)
See prior note

## 2020-10-31 NOTE — Telephone Encounter (Signed)
Call pt Please advise ANY sob, cp he would need to go to UC.   For cough, I have sent in stronger cough medication for him to use during night;  Tessalon is for day. This medication has codeine in it ( a narcotic) so he may not drink alcohol on the medication  Unfortunately he out of the window for antibody infusion therapy so conservative therapy appropriate   I looked up patient on [place] Controlled Substances Reporting System PMP AWARE and saw no activity that raised concern of inappropriate use.

## 2020-11-01 ENCOUNTER — Other Ambulatory Visit: Payer: Self-pay | Admitting: Adult Health

## 2020-11-01 ENCOUNTER — Telehealth: Payer: Self-pay

## 2020-11-01 DIAGNOSIS — U071 COVID-19: Secondary | ICD-10-CM

## 2020-11-01 LAB — NOVEL CORONAVIRUS, NAA: SARS-CoV-2, NAA: DETECTED — AB

## 2020-11-01 LAB — SARS-COV-2, NAA 2 DAY TAT

## 2020-11-01 MED ORDER — MOLNUPIRAVIR EUA 200MG CAPSULE
4.0000 | ORAL_CAPSULE | Freq: Two times a day (BID) | ORAL | 0 refills | Status: AC
  Filled 2020-11-01: qty 40, 5d supply, fill #0

## 2020-11-01 NOTE — Telephone Encounter (Signed)
Called to discuss with patient about COVID-19 symptoms and the use of one of the available treatments for those with mild to moderate Covid symptoms and at a high risk of hospitalization.  Pt appears to qualify for outpatient treatment due to co-morbid conditions and/or a member of an at-risk group in accordance with the FDA Emergency Use Authorization.    Symptom onset: 10/29/20 Cough,sore throat Vaccinated: Yes Booster? No Immunocompromised? No Qualifiers: HTN,A-fib,Heart failure  Unable to reach pt - Left message and call back number [phone].  Jake Liu

## 2020-11-01 NOTE — Progress Notes (Signed)
Outpatient Oral COVID Treatment Note  I connected with Jake Liu on 11/01/2020/8:01 PM by telephone and verified that I am speaking with the correct person using two identifiers.  I discussed the limitations, risks, security, and privacy concerns of performing an evaluation and management service by telephone and the availability of in person appointments. I also discussed with the patient that there may be a patient responsible charge related to this service. The patient expressed understanding and agreed to proceed.  Patient location: home Provider location: office  Diagnosis: COVID-19 infection  Purpose of visit: Discussion of potential use of Molnupiravir or Paxlovid, a new treatment for mild to moderate COVID-19 viral infection in non-hospitalized patients.   Subjective: Patient is a 60 y.o. male who has been diagnosed with COVID 19 viral infection.  Their symptoms began on 10/29/2020 with cough and congestion.    Past Medical History:  Diagnosis Date  . Anginal pain ([place])   . Arthritis   . Atrial fibrillation ([place])   . Cardiomyopathy ([place])    EF: 45%  . CHF (congestive heart failure) ([place])   . Fatty liver    borderline  . GERD (gastroesophageal reflux disease)   . Headache   . History of TIAs   . Hypertension   . Insomnia   . Sciatica   . Seronegative arthritis 09/27/2019  . Sleep apnea    No CPAP    Allergies  Allergen Reactions  . Sulfa Antibiotics Hives    rash   . Sulfasalazine Hives  . Bactrim [Sulfamethoxazole-Trimethoprim]     Sulfa/Rash  . Lisinopril     cough  . Zetia [Ezetimibe]     Joint pain     Current Outpatient Medications:  .  molnupiravir EUA 200 mg CAPS, Take 4 capsules (800 mg total) by mouth 2 (two) times daily for 5 days., Disp: 40 capsule, Rfl: 0 .  Adalimumab 40 MG/0.4ML PNKT, Inject into the skin. Twice a month, Disp: , Rfl:  .  benzonatate (TESSALON PERLES) 100 MG capsule, Take 1 capsule (100 mg total) by mouth 3 (three) times daily as  needed for cough., Disp: 30 capsule, Rfl: 1 .  chlorpheniramine-HYDROcodone (TUSSIONEX PENNKINETIC ER) 10-8 MG/5ML SUER, Take 5 mLs by mouth at bedtime as needed for cough., Disp: 60 mL, Rfl: 0 .  guaiFENesin (MUCINEX) 600 MG 12 hr tablet, Take 2 tablets (1,200 mg total) by mouth 2 (two) times daily., Disp: 28 tablet, Rfl: 0 .  losartan (COZAAR) 50 MG tablet, Take 1 tablet (50 mg total) by mouth daily., Disp: 90 tablet, Rfl: 3 .  methotrexate 2.5 MG tablet, Take 15 mg by mouth every Wednesday., Disp: , Rfl:  .  rivaroxaban (XARELTO) 20 MG TABS tablet, Take 20 mg by mouth at bedtime. , Disp: , Rfl:  .  rosuvastatin (CRESTOR) 40 MG tablet, Take 1 tablet (40 mg total) by mouth daily., Disp: 90 tablet, Rfl: 3 .  sotalol (BETAPACE) 120 MG tablet, Take 120 mg by mouth 2 (two) times daily., Disp: , Rfl:   Objective: Patient appears/sounds well.  They are in no apparent distress.  Breathing is non labored.  Mood and behavior are normal.  Laboratory Data:  Recent Results (from the past 2160 hour(s))  Comprehensive metabolic panel     Status: Abnormal   Collection Time: 09/07/20  9:57 AM  Result Value Ref Range   Glucose 111 (H) 65 - 99 mg/dL   BUN 13 8 - 27 mg/dL   Creatinine, Ser 0.56 (L) 0.76 -  1.27 mg/dL   GFR calc non Af Amer 112 >59 mL/min/1.73   GFR calc Af Amer 130 >59 mL/min/1.73    Comment: **In accordance with recommendations from the NKF-ASN Task force,**   [facility] is in the process of updating its eGFR calculation to the   2021 CKD-EPI creatinine equation that estimates kidney function   without a race variable.    BUN/Creatinine Ratio 23 10 - 24   Sodium 138 134 - 144 mmol/L   Potassium 4.6 3.5 - 5.2 mmol/L   Chloride 102 96 - 106 mmol/L   CO2 20 20 - 29 mmol/L   Calcium 9.3 8.6 - 10.2 mg/dL   Total Protein 6.6 6.0 - 8.5 g/dL   Albumin 4.0 3.8 - 4.9 g/dL   Globulin, Total 2.6 1.5 - 4.5 g/dL   Albumin/Globulin Ratio 1.5 1.2 - 2.2   Bilirubin Total 0.8 0.0 - 1.2 mg/dL    Alkaline Phosphatase 59 44 - 121 IU/L   AST 20 0 - 40 IU/L   ALT 21 0 - 44 IU/L  TSH     Status: None   Collection Time: 09/07/20  9:57 AM  Result Value Ref Range   TSH 2.640 0.450 - 4.500 uIU/mL  Magnesium     Status: None   Collection Time: 09/07/20  9:57 AM  Result Value Ref Range   Magnesium 1.9 1.6 - 2.3 mg/dL  CBC     Status: None   Collection Time: 09/07/20  9:57 AM  Result Value Ref Range   WBC 6.2 3.4 - 10.8 x10E3/uL   RBC 4.78 4.14 - 5.80 x10E6/uL   Hemoglobin 14.8 13.0 - 17.7 g/dL   Hematocrit 44.3 37.5 - 51.0 %   MCV 93 79 - 97 fL   MCH 31.0 26.6 - 33.0 pg   MCHC 33.4 31.5 - 35.7 g/dL   RDW 13.4 11.6 - 15.4 %   Platelets 253 150 - 450 x10E3/uL  Novel Coronavirus, NAA ([facility])     Status: Abnormal   Collection Time: 10/30/20  2:03 PM   Specimen: Nasal Swab; Nasopharyngeal(NP) swabs in vial transport medium   Nasopharynge  Result Value Ref Range   SARS-CoV-2, NAA Detected (A) Not Detected    Comment: Patients who have a positive COVID-19 test result may now have treatment options. Treatment options are available for patients with mild to moderate symptoms and for hospitalized patients. Visit our website at [email or web address] for resources and information. This nucleic acid amplification test was developed and its performance characteristics determined by [employer] and [employer]. Nucleic acid amplification tests include RT-PCR and TMA. This test has not been FDA cleared or approved. This test has been authorized by FDA under an Emergency Use Authorization (EUA). This test is only authorized for the duration of time the declaration that circumstances exist justifying the authorization of the emergency use of in vitro diagnostic tests for detection of SARS-CoV-2 virus and/or diagnosis of COVID-19 infection under section 564(b)(1) of the Act, 21 U.S.C. 250NLZ-7(Q) (1), unless the authorization is terminated or revoked sooner. When diagnostic testing is  negativ e, the possibility of a false negative result should be considered in the context of a patient's recent exposures and the presence of clinical signs and symptoms consistent with COVID-19. An individual without symptoms of COVID-19 and who is not shedding SARS-CoV-2 virus would expect to have a negative (not detected) result in this assay.   SARS-COV-2, NAA 2 DAY TAT     Status: None   Collection Time: 10/30/20  2:03 PM   Nasopharynge  Result Value Ref Range   SARS-CoV-2, NAA 2 DAY TAT Performed   POCT rapid strep A     Status: Normal   Collection Time: 10/30/20  2:27 PM  Result Value Ref Range   Rapid Strep A Screen Negative Negative     Assessment: 60 y.o. male with mild/moderate COVID 19 viral infection diagnosed on 10/30/2020 at high risk for progression to severe COVID 19.  Plan:  This patient is a 60 y.o. male that meets the following criteria for Emergency Use Authorization of: Molnupiravir  1. Age >18 yr 2. SARS-COV-2 positive test 3. Symptom onset < 5 days 4. Mild-to-moderate COVID disease with high risk for severe progression to hospitalization or death   I have spoken and communicated the following to the patient or parent/caregiver regarding: 1. Molnupiravir is an unapproved drug that is authorized for use under an [employer] production planner.  2. There are no adequate, approved, available products for the treatment of COVID-19 in adults who have mild-to-moderate COVID-19 and are at high risk for progressing to severe COVID-19, including hospitalization or death. 3. Other therapeutics are currently authorized. For additional information on all products authorized for treatment or prevention of COVID-19, please see [email or web address].  4. There are benefits and risks of taking this treatment as outlined in the "Fact Sheet for Patients and Caregivers."  5. "Fact  Sheet for Patients and Caregivers" was reviewed with patient. A hard copy will be provided to patient from pharmacy prior to the patient receiving treatment. 6. Patients should continue to self-isolate and use infection control measures (e.g., wear mask, isolate, social distance, avoid sharing personal items, clean and disinfect "high touch" surfaces, and frequent handwashing) according to CDC guidelines.  7. The patient or parent/caregiver has the option to accept or refuse treatment. 8. [place] has established a pregnancy surveillance program. 9. Females of childbearing potential should use a reliable method of contraception correctly and consistently, as applicable, for the duration of treatment and for 4 days after the last dose of Molnupiravir. 27. Males of reproductive potential who are sexually active with females of childbearing potential should use a reliable method of contraception correctly and consistently during treatment and for at least 3 months after the last dose. 11. Pregnancy status and risk was assessed. Patient verbalized understanding of precautions.   After reviewing above information with the patient, the patient agrees to receive molnupiravir.  Follow up instructions:    . Take prescription BID x 5 days as directed . Reach out to pharmacist for counseling on medication if desired . For concerns regarding further COVID symptoms please follow up with your PCP or urgent care . For urgent or life-threatening issues, seek care at your local emergency department  The patient was provided an opportunity to ask questions, and all were answered. The patient agreed with the plan and demonstrated an understanding of the instructions.   Script sent to [facility] and opted to pick up RX.  The patient was advised to call their PCP or seek an in-person evaluation if the symptoms worsen or if the condition fails to improve as anticipated.   I provided 15  minutes of non face-to-face telephone visit time during this encounter, and > 50% was spent counseling as documented under my assessment & plan.  Scot Dock, NP 11/01/2020 Randalyn Rhea PM

## 2020-11-02 ENCOUNTER — Other Ambulatory Visit: Payer: Self-pay

## 2020-11-09 NOTE — Progress Notes (Signed)
Cardiology Office Note  Date:  11/12/2020   ID:  Jake Liu, DOB 1960-08-28, MRN [id number]  PCP:  Jake Hawthorne, FNP   Chief Complaint  Patient presents with  . Follow-up    1 month F/U    HPI:  [name] is a 60 year old gentleman with past medical history of Atrial fibrillation, cardioversion June 2013, repeat July 2014, Normal ejection fraction on transesophageal echo July 2014 History of ablation, 2017 Smokes cigars Hypertension TIA Stress at work/anxiety, drinks wine Obstructive sleep apnea Coronary calcium score of 1228. In 2020 seronegative RA followed by rheumatology. Alcohol Who presents for follow-up of his hypertension, atrial fibrillation  Discussed recent events with him recurrence of atrial fibrillation.  seen by Jake Liu 09/07/2020 : increasing fatigue.  EKG showed atrial fibrillation with a rate of 98.  self-decreased his sotalol 120 mg from the prescribed twice a day to once a day about 5 months ago because he felt very tired on twice a day dosing.  advised him to increase to twice daily dosing.  starting taking sotalol 1/2 tablet (60mg ) sotalol in the morning and 120mg  in the evening.  wants to avoid higher-risk AADs but doesn't feel well on sotalol twice daily. He prefers to avoid repeat ablation if possible, but wishes to proceed with DCCV.  Procedure 09/17/20, cardioversion No ablation recently  Today taking sotalol 120 mg in the PM None in the PM  Today feels fine 3-4 glasses red wine every night More with dinner trips  Does not check BP at home  Declined EKG today Clinical exam consistent with normal sinus rhythm  Other past medical history reviewed  hospitalization May 2021 Had hernia repair Before getting into the Liu had gone into atrial fibrillation presumably secondary to the pain Placed on Cardizem infusion, metoprolol increased  In Liu Jake Liu 4 days: cardioversion metoprolol to sotalol On  xarelto  Seen by Jake Liu cardiology as outpatient last month, treadmill stress echo ordered, has not been completed yet  Does not know what medications he is taking, unclear if he is on Zetia or Crestor  Past medical history reviewed Echo 09/2017, outside facility Normal LV function, no significant valvular disease Dilated aortic root, 3.9 cm  Carotid Doppler, normal 2014  2013,  Myoview showing no ischemia   PMH:   has a past medical history of Anginal pain ([place]), Arthritis, Atrial fibrillation ([place]), Cardiomyopathy ([place]), CHF (congestive heart failure) ([place]), Fatty liver, GERD (gastroesophageal reflux disease), Headache, History of TIAs, Hypertension, Insomnia, Sciatica, Seronegative arthritis (09/27/2019), and Sleep apnea.  PSH:    Past Surgical History:  Procedure Laterality Date  . ABLATION N/A    for af  . COLONOSCOPY    . COLONOSCOPY WITH PROPOFOL N/A 10/13/2018   Procedure: COLONOSCOPY WITH PROPOFOL;  Surgeon: Liu, Jake Pike, MD;  Location: [facility] END[facility]COPY;  Service: Gastroenterology;  Laterality: N/A;  . ESOPHAGOGASTRODUODENOSCOPY    . ESOPHAGOGASTRODUODENOSCOPY (EGD) WITH PROPOFOL N/A 10/13/2018   Procedure: ESOPHAGOGASTRODUODENOSCOPY (EGD) WITH PROPOFOL;  Surgeon: Liu, Jake Pike, MD;  Location: [facility] ENDOSCOPY;  Service: Gastroenterology;  Laterality: N/A;  . HERNIA REPAIR    . KNEE SURGERY Left   . UMBILICAL HERNIA REPAIR N/A 12/05/2019   Procedure: LAPAROSCOPIC UMBILICAL HERNIA REPAIR;  Surgeon: Jake Overall, MD;  Location: [facility];  Service: General;  Laterality: N/A;    Current Outpatient Medications  Medication Sig Dispense Refill  . Adalimumab 40 MG/0.4ML PNKT Inject into the skin. Twice a month    . losartan (COZAAR) 50 MG tablet  Take 1 tablet (50 mg total) by mouth daily. 90 tablet 3  . methotrexate 2.5 MG tablet Take 15 mg by mouth every Wednesday.    . rivaroxaban (XARELTO) 20 MG TABS tablet Take 20 mg by mouth at bedtime.     . rosuvastatin (CRESTOR) 40 MG  tablet Take 1 tablet (40 mg total) by mouth daily. 90 tablet 3  . sotalol (BETAPACE) 120 MG tablet Take 120 mg by mouth 2 (two) times daily.     No current facility-administered medications for this visit.    Allergies:   Sulfa antibiotics, Sulfasalazine, Bactrim [sulfamethoxazole-trimethoprim], Lisinopril, and Zetia [ezetimibe]   Social History:  The patient  reports that he has quit smoking. He has never used smokeless tobacco. He reports previous alcohol use of about 14.0 standard drinks of alcohol per week. He reports that he does not use drugs.   Family History:   family history includes Asthma in his father; Heart disease (age of onset: 41) in his mother.    Review of Systems: Review of Systems  Constitutional: Negative.   Respiratory: Negative.   Cardiovascular: Negative.   Gastrointestinal: Negative.   Musculoskeletal: Negative.   Neurological: Negative.   Psychiatric/Behavioral: Negative.   All other systems reviewed and are negative.    PHYSICAL EXAM: VS:  BP (!) 150/86 (BP Location: Left Arm, Patient Position: Sitting, Cuff Size: Large)   Pulse 71   Ht 6' (1.829 m)   Wt 229 lb (103.9 kg)   SpO2 98%   BMI 31.06 kg/m  , BMI Body mass index is 31.06 kg/m. Constitutional:  oriented to person, place, and time. No distress.  HENT:  Head: Grossly normal Eyes:  no discharge. No scleral icterus.  Neck: No JVD, no carotid bruits  Cardiovascular: Regular rate and rhythm, no murmurs appreciated Pulmonary/Chest: Clear to auscultation bilaterally, no wheezes or rails Abdominal: Soft.  no distension.  no tenderness.  Musculoskeletal: Normal range of motion Neurological:  normal muscle tone. Coordination normal. No atrophy Skin: Skin warm and dry Psychiatric: normal affect, pleasant   Recent Labs: 09/07/2020: ALT 21; BUN 13; Creatinine, Ser 0.56; Hemoglobin 14.8; Magnesium 1.9; Platelets 253; Potassium 4.6; Sodium 138; TSH 2.640   Lipid Panel Lab Results  Component  Value Date   CHOL 255 (H) 11/28/2019   HDL 70 11/28/2019   LDLCALC 169 (H) 11/28/2019   TRIG 81 11/28/2019      Wt Readings from Last 3 Encounters:  11/12/20 229 lb (103.9 kg)  10/30/20 215 lb (97.5 kg)  09/07/20 226 lb (102.5 kg)      ASSESSMENT AND PLAN:  Atrial fibrillation status post cardioversion [facility]) Maintaining normal sinus rhythm on sotalol and Xarelto Prefers to only take sotalol in the evening, not in the morning secondary to fatigue Recent cardioversion, medications discussed with him in detail He prefers no changes  Coronary disease with stable angina Heavy coronary calcification, calcium scoring performed February 2020 Denies anginal symptoms Previously ordered stress test, not completed  Essential hypertension Blood pressure is well controlled on today's visit. No changes made to the medications. We will avoid calcium channel blocker secondary to chronic lower extremity edema  Obstructive sleep apnea Documented in the notes, recommended weight loss Not on CPAP Likely exacerbated by alcohol daily 3 to 4/day more with dinner parties  GAD (generalized anxiety disorder) Recommend he try to avoid alcohol but reports this is a regular part of his job  Mixed hyperlipidemia Recommend he stay on Crestor, lab order placed for lipid panel  Total encounter time more than 25 minutes  Greater than 50% was spent in counseling and coordination of care with the patient   No orders of the defined types were placed in this encounter.    Signed, Esmond Plants, M.D., Ph.D. 11/12/2020  [place], [place]

## 2020-11-12 ENCOUNTER — Encounter: Payer: Self-pay | Admitting: Cardiovascular Disease

## 2020-11-12 ENCOUNTER — Ambulatory Visit (INDEPENDENT_AMBULATORY_CARE_PROVIDER_SITE_OTHER): Payer: Commercial Managed Care - PPO | Admitting: Cardiovascular Disease

## 2020-11-12 ENCOUNTER — Other Ambulatory Visit: Payer: Self-pay

## 2020-11-12 VITALS — BP 150/86 | HR 71 | Ht 72.0 in | Wt 229.0 lb

## 2020-11-12 DIAGNOSIS — I4819 Other persistent atrial fibrillation: Secondary | ICD-10-CM

## 2020-11-12 DIAGNOSIS — G4733 Obstructive sleep apnea (adult) (pediatric): Secondary | ICD-10-CM

## 2020-11-12 DIAGNOSIS — I10 Essential (primary) hypertension: Secondary | ICD-10-CM

## 2020-11-12 DIAGNOSIS — I5022 Chronic systolic (congestive) heart failure: Secondary | ICD-10-CM | POA: Diagnosis not present

## 2020-11-12 DIAGNOSIS — I2510 Atherosclerotic heart disease of native coronary artery without angina pectoris: Secondary | ICD-10-CM | POA: Diagnosis not present

## 2020-11-12 DIAGNOSIS — E785 Hyperlipidemia, unspecified: Secondary | ICD-10-CM

## 2020-11-12 MED ORDER — ROSUVASTATIN CALCIUM 40 MG PO TABS
40.0000 mg | ORAL_TABLET | Freq: Every day | ORAL | 3 refills | Status: DC
Start: 2020-11-12 — End: 2021-06-03

## 2020-11-12 NOTE — Patient Instructions (Addendum)
Medication Instructions:  No changes  If you need a refill on your cardiac medications before your next appointment, please call your pharmacy.    Lab work: Fasting lipids  Walk into medical mall at the check in desk, they will direct you to lab registration, hours for labs are Monday-Friday 07:00am-5:30pm (no appointment necessary)  LABS WILL APPEAR ON MYCHART, ABNORMAL RESULTS WILL BE CALLED  Testing/Procedures: No new testing needed   Follow-Up:  . You will need a follow up appointment in 12 months  . Providers on your designated Care Team:   . Murray Hodgkins, NP . Christell Faith, PA-C . Marrianne Mood, PA-C  Please monitor blood pressures and keep a log of your readings.  If continues to be elevated, call the office  How to use a home blood pressure monitor. . Be still. . Measure at the same time every day. It's important to take the readings at the same time each day, such as morning and evening. Take reading approximately 1 1/2 to 2 hours after BP medications.   AVOID these things for 30 minutes before checking your blood pressure:  Drinking caffeine.  Drinking alcohol.  Eating.  Smoking.  Exercising.   Five minutes before checking your blood pressure:  Pee.  Sit in a dining chair. Avoid sitting in a soft couch or armchair.  Be quiet. Do not talk.       Sit correctly. Sit with your back straight and supported (on a dining chair, rather than a sofa). Your feet should be flat on the floor and your legs should not be crossed. Your arm should be supported on a flat surface (such as a table) with the upper arm at heart level. Make sure the bottom of the cuff is placed directly above the bend of the elbow.    COVID-19 Vaccine Information can be found at: ShippingScam.co.uk For questions related to vaccine distribution or appointments, please email vaccine@[email or web address] .com or call [phone].

## 2020-11-15 ENCOUNTER — Other Ambulatory Visit: Payer: Self-pay

## 2021-01-02 ENCOUNTER — Other Ambulatory Visit: Payer: Self-pay

## 2021-01-02 ENCOUNTER — Ambulatory Visit: Payer: Commercial Managed Care - PPO | Admitting: Dermatology

## 2021-01-02 ENCOUNTER — Encounter: Payer: Self-pay | Admitting: Dermatology

## 2021-01-02 DIAGNOSIS — L570 Actinic keratosis: Secondary | ICD-10-CM | POA: Diagnosis not present

## 2021-01-02 DIAGNOSIS — L814 Other melanin hyperpigmentation: Secondary | ICD-10-CM | POA: Diagnosis not present

## 2021-01-02 DIAGNOSIS — L578 Other skin changes due to chronic exposure to nonionizing radiation: Secondary | ICD-10-CM

## 2021-01-02 NOTE — Progress Notes (Signed)
   Follow-Up Visit   Subjective  Jake Liu is a 60 y.o. male who presents for the following: lesion (Patient has a left side of cheek that flakes and is really sore. He states that spot has been there for over a year. ).  Patient has no history of skin cancer.   The following portions of the chart were reviewed this encounter and updated as appropriate:  Tobacco  Allergies  Meds  Problems  Med Hx  Surg Hx  Fam Hx     Objective  Well appearing patient in no apparent distress; mood and affect are within normal limits.  A focused examination was performed including face and ears . Relevant physical exam findings are noted in the Assessment and Plan.  Objective  left cheek x 1: Erythematous thin papules/macules with gritty scale.   Assessment & Plan  Actinic keratosis left cheek x 1  Favor AK > superficial BCC  Discussed biopsy vs cryotherapy and  5-fluorouracil/calcipotriene cream followed by recheck  Patient elected LN2.  Recheck in 2 months   Prior to procedure, discussed risks of blister formation, small wound, skin dyspigmentation, or rare scar following cryotherapy.   Recommend to be good with sun protection  Actinic keratoses are precancerous spots that appear secondary to cumulative UV radiation exposure/sun exposure over time. They are chronic with expected duration over 1 year. A portion of actinic keratoses will progress to squamous cell carcinoma of the skin. It is not possible to reliably predict which spots will progress to skin cancer and so treatment is recommended to prevent development of skin cancer.  Recommend daily broad spectrum sunscreen SPF 30+ to sun-exposed areas, reapply every 2 hours as needed.  Recommend staying in the shade or wearing long sleeves, sun glasses (UVA+UVB protection) and wide brim hats (4-inch brim around the entire circumference of the hat). Call for new or changing lesions.  Destruction of lesion - left cheek x  1  Destruction method: cryotherapy   Informed consent: discussed and consent obtained   Lesion destroyed using liquid nitrogen: Yes   Cryotherapy cycles:  2 Outcome: patient tolerated procedure well with no complications   Post-procedure details: wound care instructions given    Actinic Damage - chronic, secondary to cumulative UV radiation exposure/sun exposure over time - diffuse scaly erythematous macules with underlying dyspigmentation - Recommend daily broad spectrum sunscreen SPF 30+ to sun-exposed areas, reapply every 2 hours as needed.  - Recommend staying in the shade or wearing long sleeves, sun glasses (UVA+UVB protection) and wide brim hats (4-inch brim around the entire circumference of the hat). - Call for new or changing lesions.  Lentigines - Scattered tan macules - Due to sun exposure - Benign-appering, observe - Recommend daily broad spectrum sunscreen SPF 30+ to sun-exposed areas, reapply every 2 hours as needed. - Call for any changes  Return in about 2 months (around 03/04/2021) for ak followup at left cheek and check back , 3 - 4 month tbse.  I, Ruthell Rummage, CMA, am acting as scribe for Forest Gleason, MD.  Documentation: I have reviewed the above documentation for accuracy and completeness, and I agree with the above.  Forest Gleason, MD

## 2021-01-02 NOTE — Patient Instructions (Addendum)
Actinic keratoses are precancerous spots that appear secondary to cumulative UV radiation exposure/sun exposure over time. They are chronic with expected duration over 1 year. A portion of actinic keratoses will progress to squamous cell carcinoma of the skin. It is not possible to reliably predict which spots will progress to skin cancer and so treatment is recommended to prevent development of skin cancer.  Recommend daily broad spectrum sunscreen SPF 30+ to sun-exposed areas, reapply every 2 hours as needed.  Recommend staying in the shade or wearing long sleeves, sun glasses (UVA+UVB protection) and wide brim hats (4-inch brim around the entire circumference of the hat). Call for new or changing lesions.  Cryotherapy Aftercare  . Wash gently with soap and water everyday.   Marland Kitchen Apply Vaseline and Band-Aid daily until healed.    Melanoma ABCDEs  Melanoma is the most dangerous type of skin cancer, and is the leading cause of death from skin disease.  You are more likely to develop melanoma if you:  Have light-colored skin, light-colored eyes, or red or blond hair  Spend a lot of time in the sun  Tan regularly, either outdoors or in a tanning bed  Have had blistering sunburns, especially during childhood  Have a close family member who has had a melanoma  Have atypical moles or large birthmarks  Early detection of melanoma is key since treatment is typically straightforward and cure rates are extremely high if we catch it early.   The first sign of melanoma is often a change in a mole or a new dark spot.  The ABCDE system is a way of remembering the signs of melanoma.  A for asymmetry:  The two halves do not match. B for border:  The edges of the growth are irregular. C for color:  A mixture of colors are present instead of an even brown color. D for diameter:  Melanomas are usually (but not always) greater than 76mm - the size of a pencil eraser. E for evolution:  The spot keeps  changing in size, shape, and color.  Please check your skin once per month between visits. You can use a small mirror in front and a large mirror behind you to keep an eye on the back side or your body.   If you see any new or changing lesions before your next follow-up, please call to schedule a visit.  Please continue daily skin protection including broad spectrum sunscreen SPF 30+ to sun-exposed areas, reapplying every 2 hours as needed when you're outdoors.   Staying in the shade or wearing long sleeves, sun glasses (UVA+UVB protection) and wide brim hats (4-inch brim around the entire circumference of the hat) are also recommended for sun protection.   Recommend taking Heliocare sun protection supplement daily in sunny weather for additional sun protection. For maximum protection on the sunniest days, you can take up to 2 capsules of regular Heliocare OR take 1 capsule of Heliocare Ultra. For prolonged exposure (such as a full day in the sun), you can repeat your dose of the supplement 4 hours after your first dose. Heliocare can be purchased at [facility] or at [email or web address].   If you have any questions or concerns for your doctor, please call our main line at [phone] and press option 4 to reach your doctor's medical assistant. If no one answers, please leave a voicemail as directed and we will return your call as soon as possible. Messages left after 4 pm will be answered the  following business day.   You may also send [place] a message via [place]. We typically respond to MyChart messages within 1-2 business days.  For prescription refills, please ask your pharmacy to contact our office. Our fax number is [phone].  If you have an urgent issue when the clinic is closed that cannot wait until the next business day, you can page your doctor at the number below.    Please note that while we do our best to be available for urgent issues outside of office hours, we are not  available 24/7.   If you have an urgent issue and are unable to reach [place], you may choose to seek medical care at your doctor's office, retail clinic, urgent care center, or emergency room.  If you have a medical emergency, please immediately call 911 or go to the emergency department.  Pager Numbers  - Dr. Nehemiah Massed: [phone]  - Dr. Laurence Ferrari: [phone]  - Dr. Nicole Kindred: [phone]  In the event of inclement weather, please call our main line at [phone] for an update on the status of any delays or closures.  Dermatology Medication Tips: Please keep the boxes that topical medications come in in order to help keep track of the instructions about where and how to use these. Pharmacies typically print the medication instructions only on the boxes and not directly on the medication tubes.   If your medication is too expensive, please contact our office at [phone] option 4 or send [place] a message through [place].   We are unable to tell what your co-pay for medications will be in advance as this is different depending on your insurance coverage. However, we may be able to find a substitute medication at lower cost or fill out paperwork to get insurance to cover a needed medication.   If a prior authorization is required to get your medication covered by your insurance company, please allow [place] 1-2 business days to complete this process.  Drug prices often vary depending on where the prescription is filled and some pharmacies may offer cheaper prices.  The website www.goodrx.com contains coupons for medications through different pharmacies. The prices here do not account for what the cost may be with help from insurance (it may be cheaper with your insurance), but the website can give you the price if you did not use any insurance.  - You can print the associated coupon and take it with your prescription to the pharmacy.  - You may also stop by our office during regular business hours  and pick up a GoodRx coupon card.  - If you need your prescription sent electronically to a different pharmacy, notify our office through [facility] or by phone at [phone] option 4.

## 2021-02-01 ENCOUNTER — Ambulatory Visit: Payer: Commercial Managed Care - PPO | Admitting: Family

## 2021-03-07 ENCOUNTER — Ambulatory Visit: Payer: Commercial Managed Care - PPO | Admitting: Dermatology

## 2021-04-23 ENCOUNTER — Telehealth: Payer: Self-pay | Admitting: Family

## 2021-04-23 DIAGNOSIS — M0609 Rheumatoid arthritis without rheumatoid factor, multiple sites: Secondary | ICD-10-CM | POA: Insufficient documentation

## 2021-04-23 NOTE — Telephone Encounter (Signed)
FYI I spoke with patient to triage. When I spoke with him he stated that he has had flank for about 3 weeks. He stated that he can be driving & where the seat belt hits at time he can have terrible pain. He stated pain was not at 6-7 upon my speaking with him, but does come & go. He said that he is not sure if he has had a fever, but felt warn at times. He has also been nauseated at times. I explained that he may need some STAT imaging & that we just didn't so unsafe to wait until October to be seen. Pt stated that he was so busy with work that it was hard to get away. Again, I advised that with pain sometimes at a 7 that he needed to be seen urgently as we did not know cause & even though no hx of kidney stones he could be experiencing them now. Patient has an appointment with rheumatology at [facility] that he wanted to keep. I let patient know that he could go to [facility] ED & there was [facility] there, but could not guarantee he wouldn't be sent elsewhere for imaging. Due to wait times I did recommended [place] patient was considering going there as well.

## 2021-04-23 NOTE — Telephone Encounter (Signed)
LMTCB

## 2021-04-23 NOTE — Telephone Encounter (Signed)
Patient calling in and states he is having flank pain, pain near the kidneys, and bladder pain. Onset of 3 weeks ago. Pain rated 6-7/10. Patient declines all urinary symptoms. Patient does not have history of kidney stones.   Patient declined access nurse and declined appointment with another provider. Patient would like to wait for [facility]. Patient is willing to wait to see her in an open October slot.   Please advise, Patient would like a call back

## 2021-04-24 NOTE — Telephone Encounter (Signed)
Call patient Please ask if he was seen in urgent care.  I agree it is not safe for him to wait to see me in October.  If he is having severe flank pain or bladder concerns, I would be concerned for abdominal infection, renal stone, urinary tract infection or other.  Please emphasize the importance of him being seen.

## 2021-04-24 NOTE — Telephone Encounter (Signed)
noted 

## 2021-04-24 NOTE — Telephone Encounter (Signed)
Pt stated that he did not end up going anywhere to be seen because he was not in as much pain. He said that he is now at a convention in [facility] [place]. I encouraged him that if pain increased or urinary symptoms please be seen ASAP. He also stated that he had labs done with rheumatology & if anything seriously wrong or off he hoped it would show in lab work. Pt stated that he would do so. He thanked [place] for following up.

## 2021-05-07 ENCOUNTER — Telehealth: Payer: Self-pay

## 2021-05-07 ENCOUNTER — Encounter: Payer: Self-pay | Admitting: Family

## 2021-05-07 ENCOUNTER — Ambulatory Visit: Payer: Commercial Managed Care - PPO | Admitting: Family

## 2021-05-07 ENCOUNTER — Ambulatory Visit (INDEPENDENT_AMBULATORY_CARE_PROVIDER_SITE_OTHER): Payer: Commercial Managed Care - PPO | Admitting: Family

## 2021-05-07 VITALS — Wt 218.0 lb

## 2021-05-07 DIAGNOSIS — G4733 Obstructive sleep apnea (adult) (pediatric): Secondary | ICD-10-CM | POA: Diagnosis not present

## 2021-05-07 DIAGNOSIS — R109 Unspecified abdominal pain: Secondary | ICD-10-CM

## 2021-05-07 DIAGNOSIS — E782 Mixed hyperlipidemia: Secondary | ICD-10-CM

## 2021-05-07 DIAGNOSIS — R351 Nocturia: Secondary | ICD-10-CM | POA: Diagnosis not present

## 2021-05-07 NOTE — Assessment & Plan Note (Addendum)
Uncontrolled. Question whether musculoskeletal as patient does spend a significant amount of time sitting in the car.  Declines thoracic, chest, abdominal imaging. He prefers to start with urinary studies, and consult with urology. He is unsure of exacerbating or relieving symptoms.  He will pay attention and keep a log of this.  In the meantime, advised he may use Salonpas pain patch over-the-counter.

## 2021-05-07 NOTE — Assessment & Plan Note (Addendum)
Uncontrolled. Suspect related too untreated OSA, alcohol use. Pending referral to pulmonology.  In differential however prostate etiology, pending PSA and referral to urology. Close follow up as scheduled next month.

## 2021-05-07 NOTE — Telephone Encounter (Signed)
I spoke with patient who ended up taking the [facility] appointment for today at 2p. He wanted to discuss flank pain that he has been having intermittently. He stated that he has pain when he holds urine on long trips. He would like a referral possibly placed for urology to have possible scans done etc. He will discuss with you & he is scheduled in person 11/7 with you.

## 2021-05-07 NOTE — Progress Notes (Signed)
Verbal consent for services obtained from patient prior to services given to TELEPHONE visit:   Location of call:  provider at work patient at home  Names of all persons present for services: [name], NP and patient  Complains of mid back pain , bilaterally x 4 months, unchanged.  Pain doesn't wrap around anteriorly.  Describes dull ache.  He has not noticed any particular exacerbating or relieving factors for this. He describes pain is more noticeable after drinking coffee, not a meal necessarily, however not sure if related. May be more noticeable after long drive however he isnt entirely sure.  Pain not improved with methotrexate , humira as restarted by Dr patel 04/23/21.  He hasnt tried medication for this pain.    He is eating fast food and 'wining and dining' clients. No formal exercise.  Drinks one bottle of wine each night.   He also has concerns for his 'kidneys and bladder'. he cannot hold his bladder like he used too.  Drives 6 hours or more without needing to stop.  He works in Press photographer.  He noticed that he cannot do that anymore. He has been playing golf [employer] nocturia for years.  He snores. He is not wearing cipap machine.  No fever,sob, cough, CP,  epigastric burning, constipation, vomiting, nausea, abdominal pain, hesitancy of urine, decreased urinary flow.     Weight and appetite are stable. Weight is higher than he would like.  No h/o renal stone  nonsmoker       A/P/next steps:  Problem List Items Addressed This Visit       Respiratory   Obstructive sleep apnea   Relevant Orders   Ambulatory referral to Pulmonology     Other   Flank pain - Primary    Uncontrolled. Question whether musculoskeletal as patient does spend a significant amount of time sitting in the car.  Declines thoracic, chest, abdominal imaging. He prefers to start with urinary studies, and consult with urology. He is unsure of exacerbating or relieving symptoms.  He will pay  attention and keep a log of this.  In the meantime, advised he may use Salonpas pain patch over-the-counter.      Relevant Orders   PSA   Comprehensive metabolic panel   Urinalysis, Routine w reflex microscopic   Urine Culture   Ambulatory referral to Urology   Hyperlipidemia   Relevant Orders   Lipid panel   Nocturia    Uncontrolled. Suspect related too untreated OSA, alcohol use. Pending referral to pulmonology.  In differential however prostate etiology, pending PSA and referral to urology. Close follow up as scheduled next month.       Relevant Orders   Ambulatory referral to Pulmonology   Comprehensive metabolic panel   Lipid panel   Urinalysis, Routine w reflex microscopic   Urine Culture   Ambulatory referral to Urology   Hemoglobin A1c   TSH   VITAMIN D 25 Hydroxy (Vit-D Deficiency, Fractures)     I spent 15 min  discussing plan of care over the phone.

## 2021-05-08 ENCOUNTER — Ambulatory Visit: Payer: Commercial Managed Care - PPO | Admitting: Family

## 2021-05-08 NOTE — Patient Instructions (Signed)
I placed a referral to pulmonology for further evaluation of sleep apnea.  I also placed a referral to urology. Let us know if you dont hear back within a week in regards to an appointment being scheduled.    please pay close attention to side pain as it relates to eating, sitting in the car, playing golf, particular movements to identify any relieving or exacerbating features of this.  Please let me know of any acute concerns or changes in symptoms or presentation.  As discussed, you may try over-the-counter Salonpas pain patch, particularly when you are driving to see if this is helpful.  We will discuss again in November.   Please call to schedule fasting labs, urine studies in the next 1-2 weeks

## 2021-05-10 ENCOUNTER — Other Ambulatory Visit: Payer: Self-pay

## 2021-05-10 ENCOUNTER — Ambulatory Visit (INDEPENDENT_AMBULATORY_CARE_PROVIDER_SITE_OTHER): Payer: Commercial Managed Care - PPO | Admitting: Urology

## 2021-05-10 ENCOUNTER — Encounter: Payer: Self-pay | Admitting: Urology

## 2021-05-10 VITALS — BP 140/92 | HR 76 | Ht 72.0 in | Wt 216.0 lb

## 2021-05-10 DIAGNOSIS — R109 Unspecified abdominal pain: Secondary | ICD-10-CM | POA: Diagnosis not present

## 2021-05-10 DIAGNOSIS — R351 Nocturia: Secondary | ICD-10-CM | POA: Diagnosis not present

## 2021-05-10 DIAGNOSIS — R102 Pelvic and perineal pain: Secondary | ICD-10-CM | POA: Diagnosis not present

## 2021-05-10 LAB — URINALYSIS, COMPLETE
Bilirubin, UA: NEGATIVE
Glucose, UA: NEGATIVE
Ketones, UA: NEGATIVE
Leukocytes,UA: NEGATIVE
Nitrite, UA: NEGATIVE
Protein,UA: NEGATIVE
RBC, UA: NEGATIVE
Specific Gravity, UA: 1.015 (ref 1.005–1.030)
Urobilinogen, Ur: 0.2 mg/dL (ref 0.2–1.0)
pH, UA: 6 (ref 5.0–7.5)

## 2021-05-10 NOTE — Progress Notes (Signed)
05/10/2021 8:42 AM   Jake Liu 1961-03-08 [id number]  Referring provider: Burnard Hawthorne, FNP [address] [place],  [place] [address]  Chief Complaint  Patient presents with   Other    HPI: Jake Liu is a 60 y.o. male who presents for evaluation of suprapubic and bilateral flank pain.  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  Within the past year he does note increased suprapubic pressure with bladder fullness.  The area is sensitive to touch and resolves with urination Also notes bilateral back pain which is mild to moderate.  CT and MR imaging of the abdomen and 2021 showed no urinary calculi or GU abnormalities He has nocturia x3-4 for the past 1.5 years.  He has been diagnosed with sleep apnea but is reluctant to start CPAP No bothersome daytime LUTS Annual PSA has been low and stable.  Last PSA 09/28/2019 was 0.24.  He is scheduled for a PSA with his next blood draw.   PMH: Past Medical History:  Diagnosis Date   Anginal pain ([place])    Arthritis    Atrial fibrillation ([place])    Cardiomyopathy (HCC)    EF: 45%   CHF (congestive heart failure) (HCC)    Fatty liver    borderline   GERD (gastroesophageal reflux disease)    Headache    History of TIAs    Hypertension    Insomnia    Sciatica    Seronegative arthritis 09/27/2019   Sleep apnea    No CPAP    Surgical History: Past Surgical History:  Procedure Laterality Date   ABLATION N/A    for af   COLONOSCOPY     COLONOSCOPY WITH PROPOFOL N/A 10/13/2018   Procedure: COLONOSCOPY WITH PROPOFOL;  Surgeon: Toledo, [name], MD;  Location: [facility] ENDOSCOPY;  Service: Gastroenterology;  Laterality: N/A;   ESOPHAGOGASTRODUODENOSCOPY     ESOPHAGOGASTRODUODENOSCOPY (EGD) WITH PROPOFOL N/A 10/13/2018   Procedure: ESOPHAGOGASTRODUODENOSCOPY (EGD) WITH PROPOFOL;  Surgeon: Toledo, [name], MD;  Location: [facility] ENDOSCOPY;  Service: Gastroenterology;   Laterality: N/A;   HERNIA REPAIR     KNEE SURGERY Left    UMBILICAL HERNIA REPAIR N/A 12/05/2019   Procedure: LAPAROSCOPIC UMBILICAL HERNIA REPAIR;  Surgeon: Alphonsa Overall, MD;  Location: [facility];  Service: General;  Laterality: N/A;    Home Medications:  Allergies as of 05/10/2021       Reactions   Sulfa Antibiotics Hives   rash   Sulfasalazine Hives   Bactrim [sulfamethoxazole-trimethoprim]    Sulfa/Rash   Lisinopril    cough   Zetia [ezetimibe]    Joint pain        Medication List        Accurate as of May 10, 2021  8:42 AM. If you have any questions, ask your nurse or doctor.          Adalimumab 40 MG/0.4ML Pnkt Inject into the skin. Twice a month   folic acid 1 MG tablet Commonly known as: FOLVITE Take 1 tablet by mouth daily.   losartan 50 MG tablet Commonly known as: COZAAR Take 1 tablet (50 mg total) by mouth daily.   methotrexate 2.5 MG tablet Take 15 mg by mouth every Wednesday.   methotrexate 2.5 MG tablet Commonly known as: RHEUMATREX Take 8 tablets by mouth once a week.   rivaroxaban 20 MG Tabs tablet Commonly known as: XARELTO Take 20 mg by mouth  at bedtime.   rosuvastatin 40 MG tablet Commonly known as: CRESTOR Take 1 tablet (40 mg total) by mouth daily.   sotalol 120 MG tablet Commonly known as: BETAPACE Take 120 mg by mouth daily.        Allergies:  Allergies  Allergen Reactions   Sulfa Antibiotics Hives    rash    Sulfasalazine Hives   Bactrim [Sulfamethoxazole-Trimethoprim]     Sulfa/Rash   Lisinopril     cough   Zetia [Ezetimibe]     Joint pain    Family History: Family History  Problem Relation Age of Onset   Heart disease Mother 38   Asthma Father     Social History:  reports that he has quit smoking. He has never used smokeless tobacco. He reports that he does not currently use alcohol after a past usage of about 14.0 standard drinks per week. He reports that he does not use drugs.   Physical  Exam: BP (!) 140/92   Pulse 76   Ht 6' (1.829 m)   Wt 216 lb (98 kg)   BMI 29.29 kg/m   Constitutional:  Alert and oriented, No acute distress. HEENT: [place] AT, moist mucus membranes.  Trachea midline, no masses. Cardiovascular: No clubbing, cyanosis, or edema. Respiratory: Normal respiratory effort, no increased work of breathing. GU: Prostate 35 g, smooth without nodules Skin: No rashes, bruises or suspicious lesions. Neurologic: Grossly intact, no focal deficits, moving all 4 extremities. Psychiatric: Normal mood and affect.  Laboratory Data: Lab Results  Component Value Date   PSA 0.24 09/28/2019   PSA 0.33 08/18/2018   PSA 0.26 01/05/2015    Urinalysis Dipstick/microscopy negative   Assessment & Plan:   60 y.o. male with suprapubic pain with bladder fullness and bilateral flank pain Will schedule renal ultrasound with survey views of the bladder Urinalysis today is clear Benign DRE, PSA has been ordered by PCP His only voiding symptom is nocturia x3-4 which is most likely secondary to his untreated sleep apnea   Abbie Sons, MD  [facility] [address] [place], [place] [address] [phone]

## 2021-05-21 ENCOUNTER — Ambulatory Visit
Admission: RE | Admit: 2021-05-21 | Discharge: 2021-05-21 | Disposition: A | Discharge status: Home / Self Care | Payer: Commercial Managed Care - PPO | Source: Ambulatory Visit | Attending: Urology | Admitting: Urology

## 2021-05-21 ENCOUNTER — Other Ambulatory Visit: Payer: Self-pay

## 2021-05-21 DIAGNOSIS — R109 Unspecified abdominal pain: Secondary | ICD-10-CM | POA: Diagnosis present

## 2021-05-21 DIAGNOSIS — R102 Pelvic and perineal pain: Secondary | ICD-10-CM | POA: Diagnosis present

## 2021-05-21 IMAGING — US US RENAL
1 series · 14 of 24 positions shown · non-contrast
Comparison: CT [DATE].

CLINICAL DATA: Flank pain.  Pelvic pain.

EXAM:
RENAL / URINARY TRACT ULTRASOUND COMPLETE

[Series 1: us renal · 0.25mm/px · 14 of 24 slices shown]
[im 1/24]
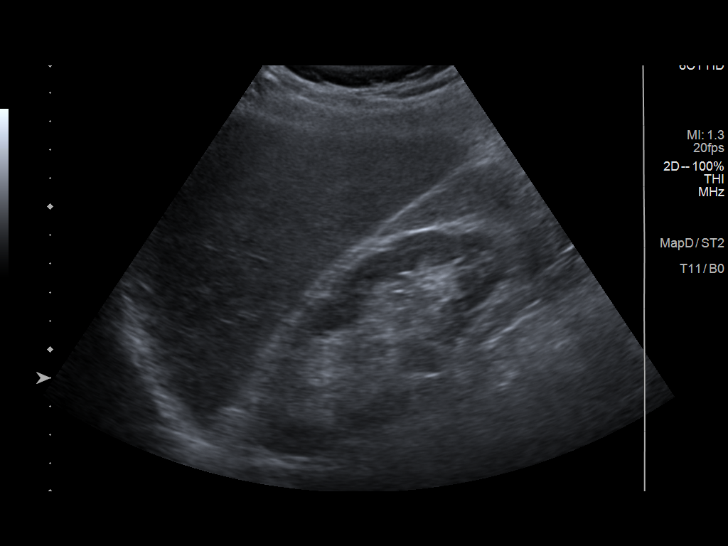
[im 3/24]
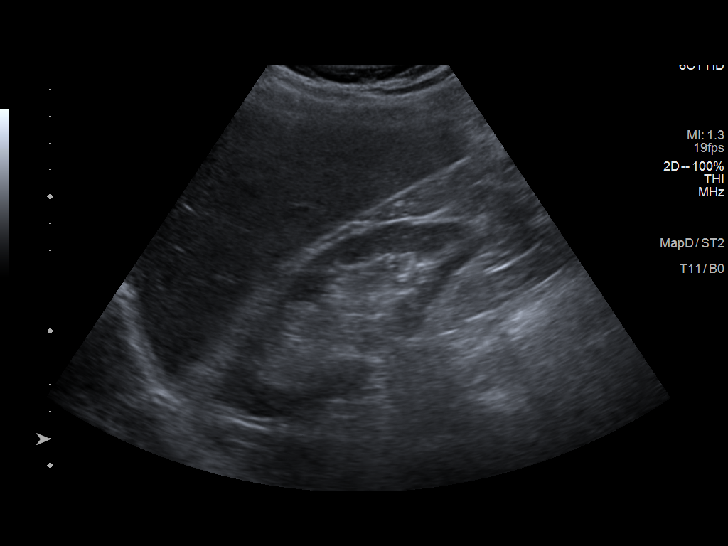
[im 5/24]
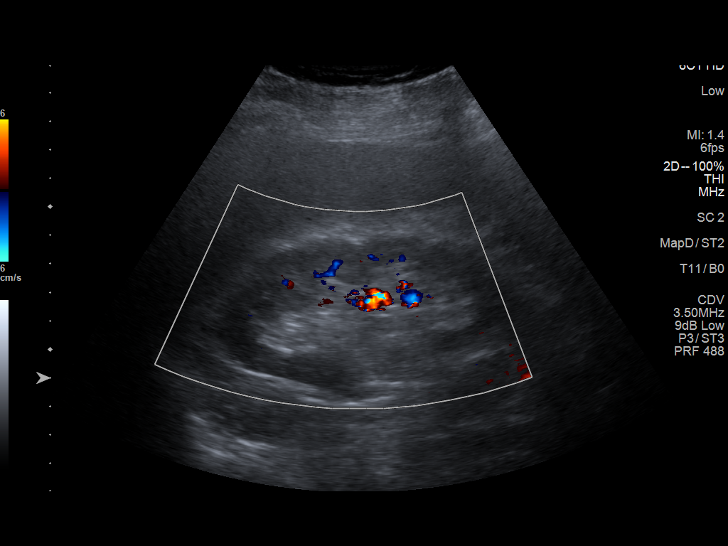
[im 7/24]
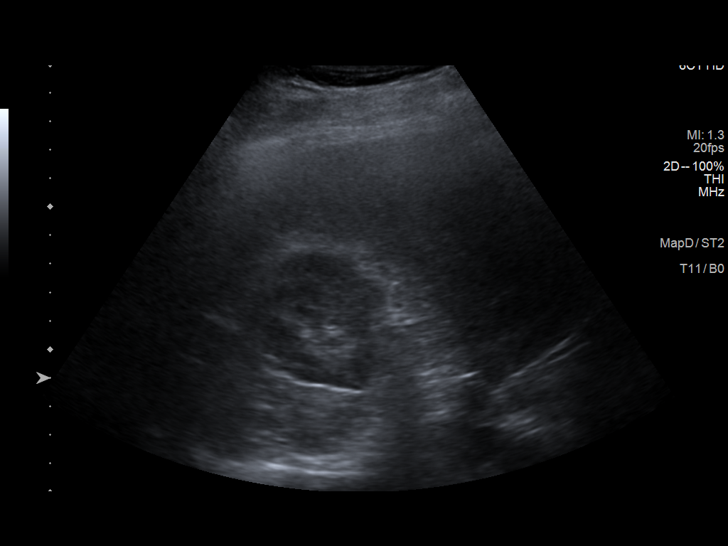
[im 8/24]
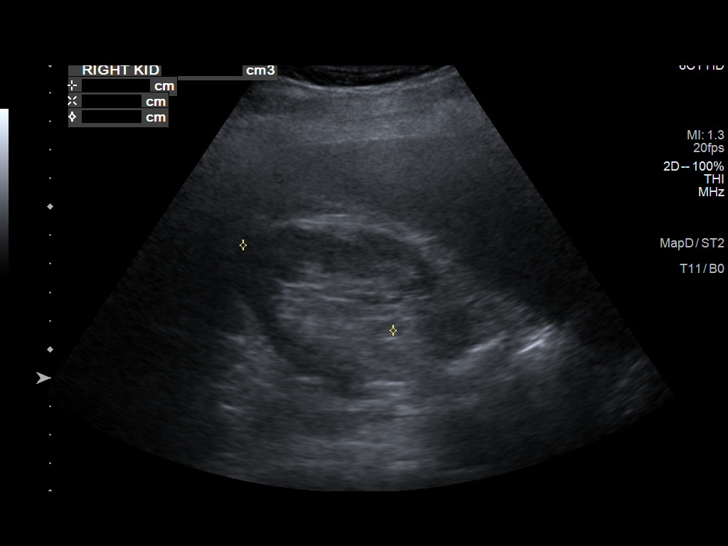
[im 10/24]
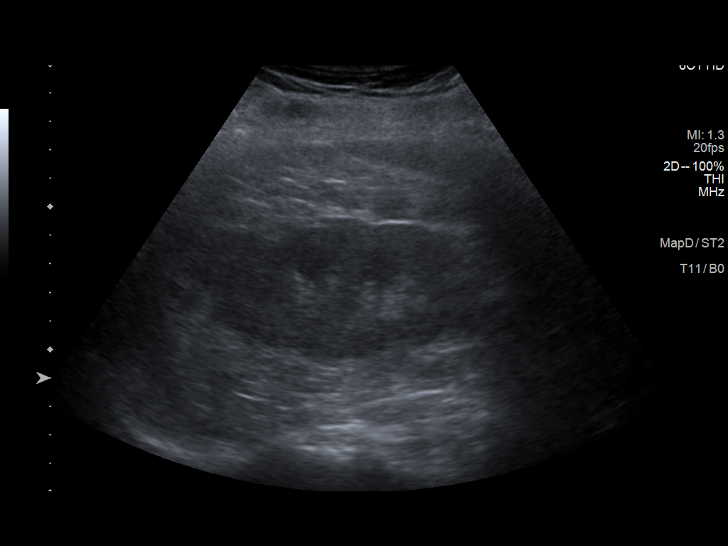
[im 12/24]
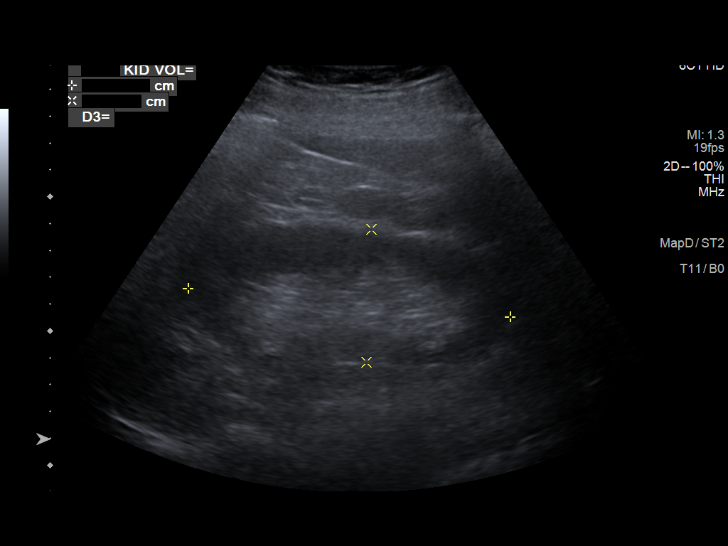
[im 13/24]
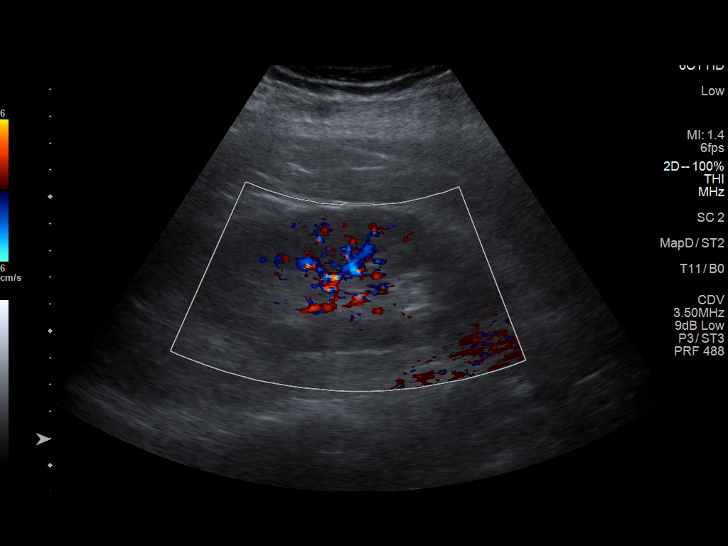
[im 15/24]
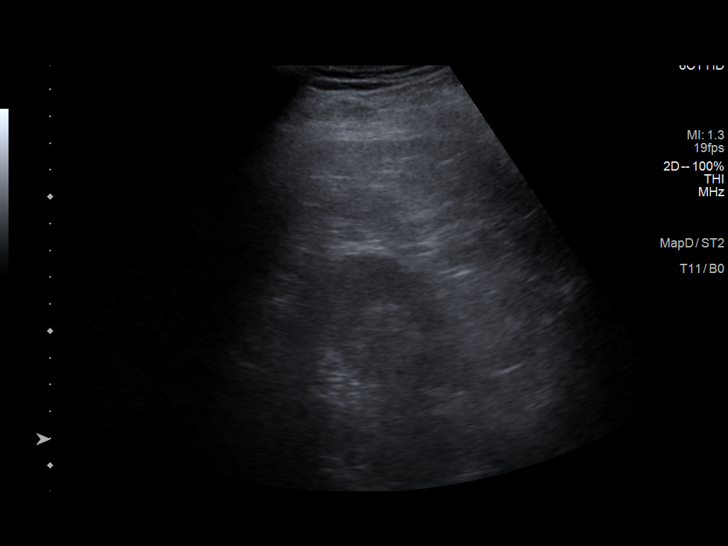
[im 17/24]
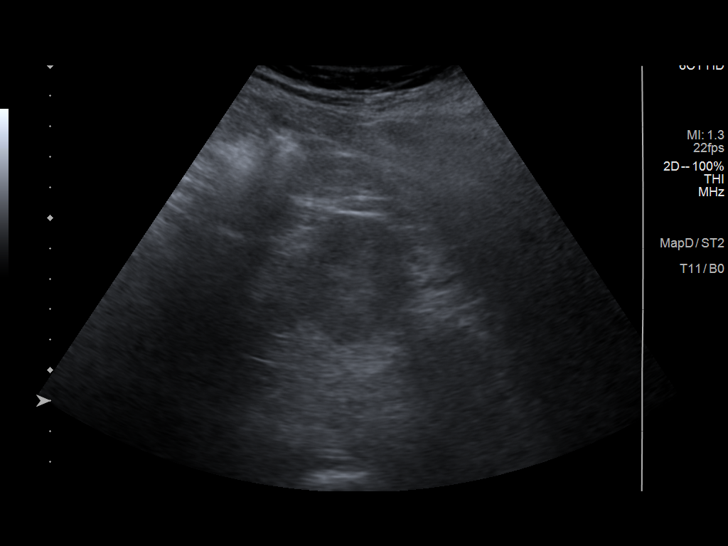
[im 19/24]
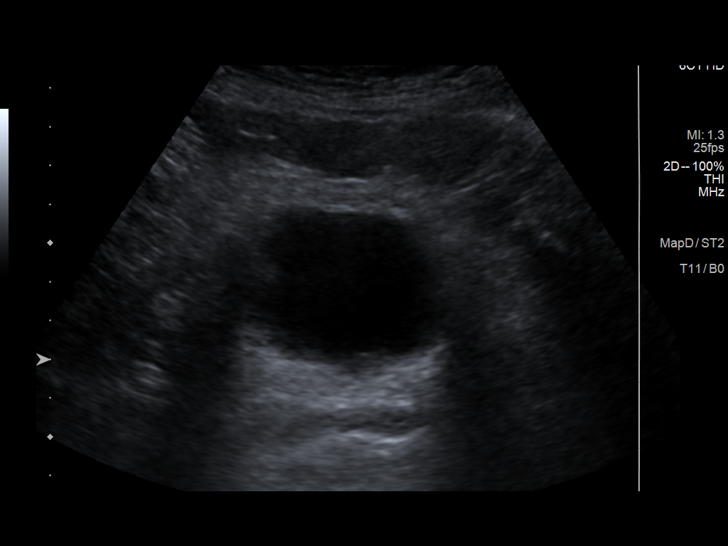
[im 20/24]
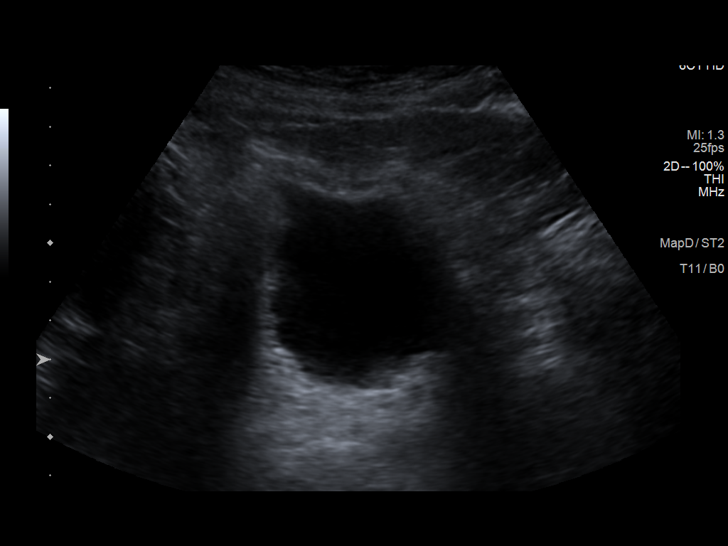
[im 22/24]
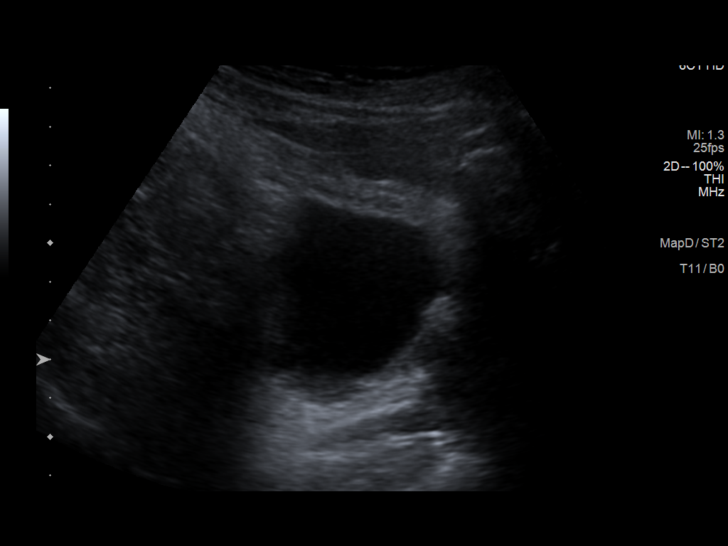
[im 24/24]
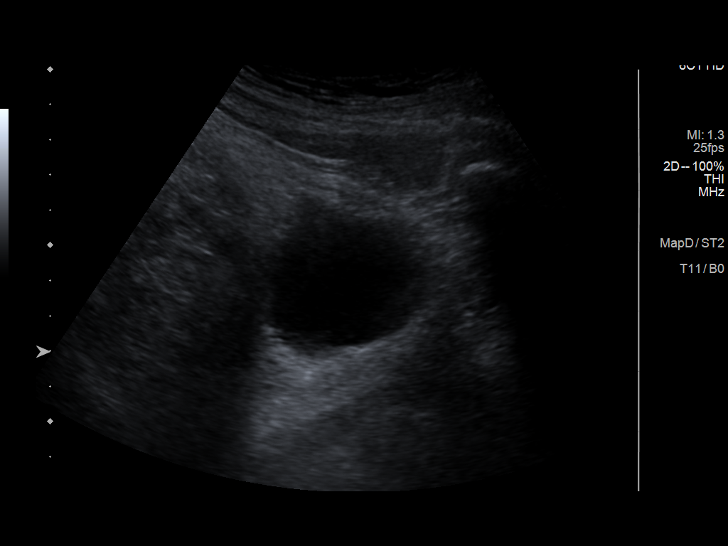

[14 of 24 positions shown; findings below may reference images not displayed]

FINDINGS: Right Kidney:

Renal measurements: 11.4 x 4.4 x 6.1 cm = volume: 160 mL. Cortical
thinning cannot be excluded. Echogenicity within normal limits. No
mass or hydronephrosis visualized.

Left Kidney:

Renal measurements: 12.1 x 5.0 x 5.0 cm = volume: 157 mL. Cortical
thinning cannot be excluded. Echogenicity within normal limits. No
mass or hydronephrosis visualized.

Bladder:

Appears normal for degree of bladder distention.

Other:

None.
IMPRESSION: 1.  Bilateral cortical thinning cannot be excluded.

2.  No acute abnormality.  No hydronephrosis or bladder distention.

## 2021-05-22 ENCOUNTER — Telehealth: Payer: Self-pay

## 2021-05-22 ENCOUNTER — Telehealth: Payer: Self-pay | Admitting: Family

## 2021-05-22 NOTE — Telephone Encounter (Signed)
I spoke with patient & asked him to contact Dr. Maryclare Labrador office bc he had ordered. I provided patient with number.

## 2021-05-22 NOTE — Telephone Encounter (Signed)
Pt called in requesting results about imaging pt had yesterday around 8am. Pt would like Dr. Vidal Schwalbe to due a follow up call about results of images. Pt would like to hear back from someone today. Advise Pt their is no guarantee on callback today.

## 2021-05-22 NOTE — Telephone Encounter (Signed)
Patient called and left a vmail on the triage requesting results of his renal u/s. Does he need an appointment to review or will we call with results, please advise?

## 2021-05-23 NOTE — Telephone Encounter (Signed)
Left message on voice mail per DPR .  °

## 2021-05-23 NOTE — Telephone Encounter (Signed)
Renal ultrasound showed no abnormalities.

## 2021-06-03 ENCOUNTER — Other Ambulatory Visit: Payer: Self-pay

## 2021-06-03 ENCOUNTER — Ambulatory Visit
Admission: RE | Admit: 2021-06-03 | Discharge: 2021-06-03 | Disposition: A | Discharge status: Home / Self Care | Payer: Commercial Managed Care - PPO | Source: Ambulatory Visit | Attending: Family | Admitting: Family

## 2021-06-03 ENCOUNTER — Encounter: Payer: Self-pay | Admitting: Family

## 2021-06-03 ENCOUNTER — Ambulatory Visit: Payer: Commercial Managed Care - PPO | Admitting: Family

## 2021-06-03 VITALS — BP 114/70 | HR 71 | Temp 97.5°F | Ht 72.0 in | Wt 220.0 lb

## 2021-06-03 DIAGNOSIS — E782 Mixed hyperlipidemia: Secondary | ICD-10-CM | POA: Diagnosis not present

## 2021-06-03 DIAGNOSIS — I4891 Unspecified atrial fibrillation: Secondary | ICD-10-CM

## 2021-06-03 DIAGNOSIS — R102 Pelvic and perineal pain: Secondary | ICD-10-CM | POA: Insufficient documentation

## 2021-06-03 MED ORDER — ROSUVASTATIN CALCIUM 40 MG PO TABS: 40.0000 mg | ORAL_TABLET | Freq: Every day | ORAL | 3 refills | Status: DC

## 2021-06-03 NOTE — Assessment & Plan Note (Addendum)
Suspect uncontrolled now the patient has been off atorvastatin. Advised for him to gradually restart rosuvastatin once per week and see if myalgias were to recur.  Question if rheumatoid arthritis was also complicating this picture.  If myalgias were to recur, likely would consider trial of another statin or PCSK9 inhibitor.

## 2021-06-03 NOTE — Assessment & Plan Note (Addendum)
Symptomatic with fatigue, palpitations.  EKG shows is in recurrent atrial fib. HR 70-150.  He self decreased sotalol to 60 mg just taken once per day.  This is a lower dose that he been on when he last seen by Dr. [name] at [place], [place].  He remains compliant with Xarelto 20 mg. Consulted with Laurann Montana, NP whom advised to speak with Dr [name] or advise ED. CMA sarah spoke to [facility] Triage cardiology to reach Dr  [name] ( not in office today) whom also advised ED if asymptomatic.  Counseled on the importance of limiting or abstaining completely from caffeine as well as alcohol use, and decongestants.  He is aware of this.  Counseled patient on risks of going into sustained elevated HR, MI. Advised to go by 911 to nearest ED and declines. Ct abdomen and pelvis had  Been scheduled and patient has decided to go there first and then decide if he would go to ED. Advised against this and for his safety asked over and over again if he would go to ED where they could preform CT a/p and monitor HR, EKG. Likely he would be admitted as patient was acutely aware. He has signed an [facility] form and states he will drive to ct a/p at [place]. He will consider ED.

## 2021-06-03 NOTE — Progress Notes (Signed)
Subjective:    Patient ID: Jake Liu, male    DOB: 11-08-1960, 60 y.o.   MRN: [id number]  CC: Jake Liu is a 60 y.o. male who presents today for follow up.   HPI: Complains of suprapubic pain  x 5 months, comes and goes. Describes as 'cramping' this past weekend while at golf outing.  Pain was so severe at times that he was doubled over.  If underwear or belt is too tight, it will cause pain. Pain will resolve when he is not wearing belt will resolve. No back pain. endorses nocturia No fever, weight loss, numbness, constipation, diarrhea, hematuria, pain with bowel movement.   He is no longer taking losartan.  He is not taking rosuvastatin due arthralgia with .    Consult with urology, Dr. Bernardo Heater in 05/10/2021 regarding suprapubic pain, bilateral flank pain.  Renal ultrasound 05/21/21 no acute abnormality  Complains of palpitations of the past several days and fatigue.  He feels he may be back in atrial fibrillation. Compliant with sotalol 60mg  QD ( prior 60mg  every morning and 120mg  every evening). He had self decreased sotalol as felt made him fatigued.  Compliant with Xarelto 20 mg daily.  Denies chest pain, left arm numbness, shortness of breath. Reports has been drinking alcohol over the weekend.  He had 3 cups of coffee today and has also been taking a cough medication at home to help sleep. Atrial fibrillation- H/o  cardioversion June 2013, repeat July 2014. Follow up with Dr [name] 10/2020 in which was maintaining NSR   Last seen EP, Dr [name] with [facility] 08/2020  Drinks red wine. Stressful job  ventral hernia mesh repair 11/2019  Rheumatoid arthritis-following with Dr. Posey Pronto and compliant with methotrexate, humira       HISTORY:  Past Medical History:  Diagnosis Date   Anginal pain ([place])    Arthritis    Atrial fibrillation ([place])    Cardiomyopathy ([place])    EF: 45%   CHF (congestive heart failure) (HCC)    Fatty liver    borderline   GERD  (gastroesophageal reflux disease)    Headache    History of TIAs    Hypertension    Insomnia    Sciatica    Seronegative arthritis 09/27/2019   Sleep apnea    No CPAP   Past Surgical History:  Procedure Laterality Date   ABLATION N/A    for af   COLONOSCOPY     COLONOSCOPY WITH PROPOFOL N/A 10/13/2018   Procedure: COLONOSCOPY WITH PROPOFOL;  Surgeon: Toledo, [name], MD;  Location: [facility] END[facility];  Service: Gastroenterology;  Laterality: N/A;   ESOPHAGOGASTRODUODENOSCOPY     ESOPHAGOGASTRODUODENOSCOPY (EGD) WITH PROPOFOL N/A 10/13/2018   Procedure: ESOPHAGOGASTRODUODENOSCOPY (EGD) WITH PROPOFOL;  Surgeon: Toledo, [name], MD;  Location: [facility];  Service: Gastroenterology;  Laterality: N/A;   HERNIA REPAIR     KNEE SURGERY Left    UMBILICAL HERNIA REPAIR N/A 12/05/2019   Procedure: LAPAROSCOPIC UMBILICAL HERNIA REPAIR;  Surgeon: Alphonsa Overall, MD;  Location: [facility];  Service: General;  Laterality: N/A;   Family History  Problem Relation Age of Onset   Heart disease Mother 80   Asthma Father     Allergies: Sulfa antibiotics, Sulfasalazine, Bactrim [sulfamethoxazole-trimethoprim], Lisinopril, and Zetia [ezetimibe] Current Outpatient Medications on File Prior to Visit  Medication Sig Dispense Refill   Adalimumab 40 MG/0.4ML PNKT Inject into the skin. Twice a month     folic acid (FOLVITE) 1 MG tablet Take 1 tablet  by mouth daily.     methotrexate (RHEUMATREX) 2.5 MG tablet Take 6 tablets by mouth once a week.     methotrexate 2.5 MG tablet Take 15 mg by mouth every Wednesday.     rivaroxaban (XARELTO) 20 MG TABS tablet Take 20 mg by mouth at bedtime.      sotalol (BETAPACE) 120 MG tablet Take 120 mg by mouth daily. 30 tablet 11   No current facility-administered medications on file prior to visit.    Social History   Tobacco Use   Smoking status: Former   Smokeless tobacco: Never  Scientific laboratory technician Use: Never used  Substance Use Topics   Alcohol use: Not  Currently    Alcohol/week: 14.0 standard drinks    Types: 7 Glasses of wine, 7 Standard drinks or equivalent per week    Comment: drinks at night., bottle and half of red wine;  Drinks 10 cups coffee daily   Drug use: No    Review of Systems  Constitutional:  Positive for fatigue. Negative for chills and fever.  Respiratory:  Negative for cough.   Cardiovascular:  Positive for palpitations. Negative for chest pain and leg swelling.  Gastrointestinal:  Positive for abdominal pain. Negative for abdominal distention, constipation, diarrhea, nausea and vomiting.  Genitourinary:  Positive for difficulty urinating and frequency. Negative for decreased urine volume, hematuria and testicular pain.  Neurological:  Negative for dizziness and numbness.     Objective:    BP 114/70   Pulse 71   Temp (!) 97.5 F (36.4 C) (Oral)   Ht 6' (1.829 m)   Wt 220 lb (99.8 kg)   SpO2 98%   BMI 29.84 kg/m  BP Readings from Last 3 Encounters:  06/03/21 114/70  05/10/21 (!) 140/92  11/12/20 (!) 150/86   Wt Readings from Last 3 Encounters:  06/03/21 220 lb (99.8 kg)  05/10/21 216 lb (98 kg)  05/07/21 218 lb (98.9 kg)    Physical Exam Vitals reviewed.  Constitutional:      Appearance: Normal appearance. He is well-developed.  Cardiovascular:     Rate and Rhythm: Tachycardia present. Rhythm regularly irregular.     Heart sounds: Normal heart sounds.  Pulmonary:     Effort: Pulmonary effort is normal. No respiratory distress.     Breath sounds: Normal breath sounds. No wheezing or rales.  Abdominal:     General: Bowel sounds are normal. There is no distension.     Palpations: Abdomen is soft. Abdomen is not rigid. There is no fluid wave or mass.     Tenderness: There is abdominal tenderness in the suprapubic area. There is no guarding or rebound. Negative signs include Murphy's sign and McBurney's sign.  Musculoskeletal:     Comments: Bilateral Hip: No limp or waddling gait. Full ROM with  flexion and hip rotation in flexion.    No pain of lateral hip with  (flexion-abduction-external rotation) test.   No pain with deep palpation of greater trochanter.     Skin:    General: Skin is warm and dry.  Neurological:     Mental Status: He is alert.  Psychiatric:        Speech: Speech normal.        Behavior: Behavior normal.       Assessment & Plan:   Problem List Items Addressed This Visit       Cardiovascular and Mediastinum   Atrial fibrillation status post cardioversion (HCC)    Symptomatic with fatigue,  palpitations.  EKG shows is in recurrent atrial fib. HR 70-150.  He self decreased sotalol to 60 mg just taken once per day.  This is a lower dose that he been on when he last seen by Dr. [name] at [place], [place].  He remains compliant with Xarelto 20 mg. Consulted with Laurann Montana, NP whom advised to speak with Dr [name] or advise ED. CMA sarah spoke to [facility] Triage cardiology to reach Dr  [name] ( not in office today) whom also advised ED if asymptomatic.  Counseled on the importance of limiting or abstaining completely from caffeine as well as alcohol use, and decongestants.  He is aware of this.  Counseled patient on risks of going into sustained elevated HR, MI. Advised to go by 911 to nearest ED and declines. Ct abdomen and pelvis had  Been scheduled and patient has decided to go there first and then decide if he would go to ED. Advised against this and for his safety asked over and over again if he would go to ED where they could preform CT a/p and monitor HR, EKG. Likely he would be admitted as patient was acutely aware. He has signed an [facility] form and states he will drive to ct a/p at [place]. He will consider ED.       Relevant Medications   rosuvastatin (CRESTOR) 40 MG tablet   Other Relevant Orders   EKG 12-Lead (Completed)     Other   Hyperlipidemia    Suspect uncontrolled now the patient has been off atorvastatin. Advised for him to gradually restart  rosuvastatin once per week and see if myalgias were to recur.  Question if rheumatoid arthritis was also complicating this picture.  If myalgias were to recur, likely would consider trial of another statin or PCSK9 inhibitor.       Relevant Medications   rosuvastatin (CRESTOR) 40 MG tablet   Suprapubic pain - Primary    Patient well-appearing today.  He is afebrile.  Tenderness with deep palpation suprapubic area.  Hip exam was benign. Continue to question if groin pain is hip etiology.Consider Xr hip at follow up.  In setting of escalated cramping, suprapubic area over the weekend, I ordered stat CT abdomen pelvis to evaluate for acute infection, etiology including diverticulitis, abnormal bladder findings. If CT abdomen pelvis is unrevealing and PSA is normal.  Advised patient that he would need to discuss the need postvoid residual, imaging of the prostate with Dr Bernardo Heater in setting of symptoms.       Relevant Orders   TSH   CBC with Differential/Platelet   Comprehensive metabolic panel   Hemoglobin A1c   VITAMIN D 25 Hydroxy (Vit-D Deficiency, Fractures)   PSA   Urinalysis, Routine w reflex microscopic   CT ABDOMEN PELVIS W CONTRAST     I have discontinued Yates Coffie's losartan. I am also having him maintain his rivaroxaban, methotrexate, Adalimumab, sotalol, folic acid, methotrexate, and rosuvastatin.   Meds ordered this encounter  Medications   rosuvastatin (CRESTOR) 40 MG tablet    Sig: Take 1 tablet (40 mg total) by mouth daily.    Dispense:  90 tablet    Refill:  3    Order Specific Question:   Supervising Provider    Answer:   Crecencio Mc [[id number]]     Return precautions given.   Risks, benefits, and alternatives of the medications and treatment plan prescribed today were discussed, and patient expressed understanding.   Education regarding symptom management  and diagnosis given to patient on AVS.  Continue to follow with Burnard Hawthorne, FNP for routine  health maintenance.   [name] and I agreed with plan.   [name], FNP  I have spent 40 minutes with a patient including precharting, exam, reviewing medical records, contacting cardiology/triage and discussion plan of care.

## 2021-06-03 NOTE — Progress Notes (Signed)
CT informed patient of wait time. Patient informed CT that as soon as he got the CT scan finished here at [facility] that he was driving to [facility] ER. Provider informed of patient's circumstances and plans and she stated she was aware and would follow up. Patient then had left and could not be found. [name] aware [phone]

## 2021-06-03 NOTE — Patient Instructions (Addendum)
Restart crestor 20mg  once per week and gradually increase each week by 1 day as discussed.  Please call me with any questions or if joint pain were to worsen  Pending ct abdomen and pelvis.   As discussed at length tonight, advise you because your heart rate being so elevated ( greater than 100 and running up to 130)  to go to the emergency room to be evaluated to ensure that you are safe.

## 2021-06-03 NOTE — Assessment & Plan Note (Signed)
Patient well-appearing today.  He is afebrile.  Tenderness with deep palpation suprapubic area.  Hip exam was benign. Continue to question if groin pain is hip etiology.Consider Xr hip at follow up.  In setting of escalated cramping, suprapubic area over the weekend, I ordered stat CT abdomen pelvis to evaluate for acute infection, etiology including diverticulitis, abnormal bladder findings. If CT abdomen pelvis is unrevealing and PSA is normal.  Advised patient that he would need to discuss the need postvoid residual, imaging of the prostate with Dr Bernardo Heater in setting of symptoms.

## 2021-06-04 ENCOUNTER — Telehealth: Payer: Self-pay | Admitting: Family

## 2021-06-04 ENCOUNTER — Telehealth: Payer: Self-pay | Admitting: Cardiovascular Disease

## 2021-06-04 DIAGNOSIS — I2510 Atherosclerotic heart disease of native coronary artery without angina pectoris: Secondary | ICD-10-CM | POA: Insufficient documentation

## 2021-06-04 LAB — URINALYSIS, ROUTINE W REFLEX MICROSCOPIC
Hgb urine dipstick: NEGATIVE
Ketones, ur: NEGATIVE
Leukocytes,Ua: NEGATIVE
Nitrite: NEGATIVE
RBC / HPF: NONE SEEN (ref 0–?)
Specific Gravity, Urine: >=1.03 — AB (ref 1.000–1.030)
Total Protein, Urine: NEGATIVE
Urine Glucose: NEGATIVE
Urobilinogen, UA: 0.2 (ref 0.0–1.0)
pH: 5.5 (ref 5.0–8.0)

## 2021-06-04 LAB — CBC WITH DIFFERENTIAL/PLATELET
Basophils Absolute: 0.1 10*3/uL (ref 0.0–0.1)
Basophils Relative: 0.7 % (ref 0.0–3.0)
Eosinophils Absolute: 0.3 10*3/uL (ref 0.0–0.7)
Eosinophils Relative: 3.8 % (ref 0.0–5.0)
HCT: 46.3 % (ref 39.0–52.0)
Hemoglobin: 15.5 g/dL (ref 13.0–17.0)
Lymphocytes Relative: 34.6 % (ref 12.0–46.0)
Lymphs Abs: 2.6 10*3/uL (ref 0.7–4.0)
MCHC: 33.6 g/dL (ref 30.0–36.0)
MCV: 91.9 fl (ref 78.0–100.0)
Monocytes Absolute: 0.7 10*3/uL (ref 0.1–1.0)
Monocytes Relative: 9 % (ref 3.0–12.0)
Neutro Abs: 4 10*3/uL (ref 1.4–7.7)
Neutrophils Relative %: 51.9 % (ref 43.0–77.0)
Platelets: 281 10*3/uL (ref 150.0–400.0)
RBC: 5.03 Mil/uL (ref 4.22–5.81)
RDW: 13.6 % (ref 11.5–15.5)
WBC: 7.6 10*3/uL (ref 4.0–10.5)

## 2021-06-04 LAB — COMPREHENSIVE METABOLIC PANEL
ALT: 23 U/L (ref 0–53)
AST: 21 U/L (ref 0–37)
Albumin: 4.3 g/dL (ref 3.5–5.2)
Alkaline Phosphatase: 61 U/L (ref 39–117)
BUN: 19 mg/dL (ref 6–23)
CO2: 26 mEq/L (ref 19–32)
Calcium: 9.9 mg/dL (ref 8.4–10.5)
Chloride: 101 mEq/L (ref 96–112)
Creatinine, Ser: 0.89 mg/dL (ref 0.40–1.50)
GFR: 92.71 mL/min (ref >60.00)
Glucose, Bld: 85 mg/dL (ref 70–99)
Potassium: 4.7 mEq/L (ref 3.5–5.1)
Sodium: 138 mEq/L (ref 135–145)
Total Bilirubin: 0.8 mg/dL (ref 0.2–1.2)
Total Protein: 7.3 g/dL (ref 6.0–8.3)

## 2021-06-04 LAB — HEMOGLOBIN A1C: Hgb A1c MFr Bld: 5.7 % (ref 4.6–6.5)

## 2021-06-04 LAB — PSA: PSA: 0.3 ng/mL (ref 0.10–4.00)

## 2021-06-04 LAB — VITAMIN D 25 HYDROXY (VIT D DEFICIENCY, FRACTURES): VITD: 15.03 ng/mL — ABNORMAL LOW (ref 30.00–100.00)

## 2021-06-04 LAB — TSH: TSH: 3.12 u[IU]/mL (ref 0.35–5.50)

## 2021-06-04 NOTE — Telephone Encounter (Signed)
LMTCB

## 2021-06-04 NOTE — Telephone Encounter (Signed)
Attempted to schedule ov per Dr. [name] possible cardioversion discussion.      LMOV to call office.

## 2021-06-04 NOTE — Telephone Encounter (Signed)
Call pt  Please see if he went to [facility] ED. I cannot see in epic How is he feeling?  Does he have palpitations, cp, sob?  I can see ct a/p was not done.

## 2021-06-04 NOTE — Telephone Encounter (Signed)
Was able to reach out to [name] as requested by Dr. [name] to f/u on a-fib, per Dr. [name] secure message  I received a message from Jake Liu he is in atrial fibrillation/flutter, can we call him to see if he would like cardioversion.  Normally he sees EP at [facility], he should reach out to Dr. [name]  [name] advised he drove himself to [facility] and Dr. [name] is taking care of him at this time. Currently admitted at this time, per [name]. (Unable to follow in Epic at current). [name] is thankful for reaching out and following up with him, will call the office if anything needed form Dr. [name].   Will verbally let Dr. [name] know of current status and send message to PCP.

## 2021-06-04 NOTE — Telephone Encounter (Signed)
Per nurse no longer needed admitted to [facility] and seen there by EP.

## 2021-06-04 NOTE — Telephone Encounter (Signed)
FYI

## 2021-06-06 ENCOUNTER — Telehealth: Payer: Self-pay

## 2021-06-06 NOTE — Telephone Encounter (Signed)
LMTCB for labs. 

## 2021-06-19 ENCOUNTER — Ambulatory Visit: Payer: Commercial Managed Care - PPO

## 2021-06-24 ENCOUNTER — Other Ambulatory Visit: Payer: Self-pay | Admitting: Family

## 2021-06-24 DIAGNOSIS — I10 Essential (primary) hypertension: Secondary | ICD-10-CM

## 2021-06-24 NOTE — Telephone Encounter (Signed)
Hey, I cannot quite make out if this patient still needs this since he is seeing [place] [facility]. Please advise!

## 2021-06-27 ENCOUNTER — Ambulatory Visit
Admission: RE | Admit: 2021-06-27 | Discharge: 2021-06-27 | Disposition: A | Discharge status: Home / Self Care | Payer: Commercial Managed Care - PPO | Source: Ambulatory Visit | Attending: Family | Admitting: Family

## 2021-06-27 DIAGNOSIS — R102 Pelvic and perineal pain: Secondary | ICD-10-CM | POA: Diagnosis not present

## 2021-06-27 IMAGING — CT CT ABD-PELV W/ CM
2 of 5 series · 16 of 46 positions shown, 18 images · IV contrast (APPLIED)
Comparison: [DATE]

CLINICAL DATA: Suprapubic abdominal pain intermittently for 2
months, diarrhea

EXAM:
CT ABDOMEN AND PELVIS WITH CONTRAST
TECHNIQUE: Multidetector CT imaging of the abdomen and pelvis was performed
using the standard protocol following bolus administration of
intravenous contrast.
CONTRAST:  100mL OMNIPAQUE IOHEXOL 300 MG/ML SOLN, additional oral
enteric contrast

[Series 2: routine abd/pel with · axial · 0.98mm/px · z∈[-426,+4]mm · 13 of 98 slices shown, 15 images]
[im 6/98  soft-tissue]
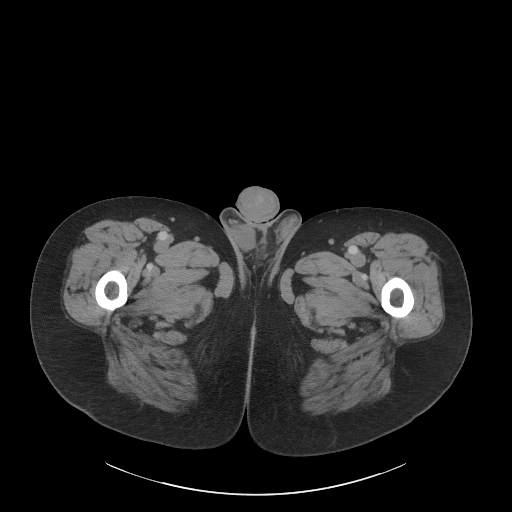
[im 6/98  bone]
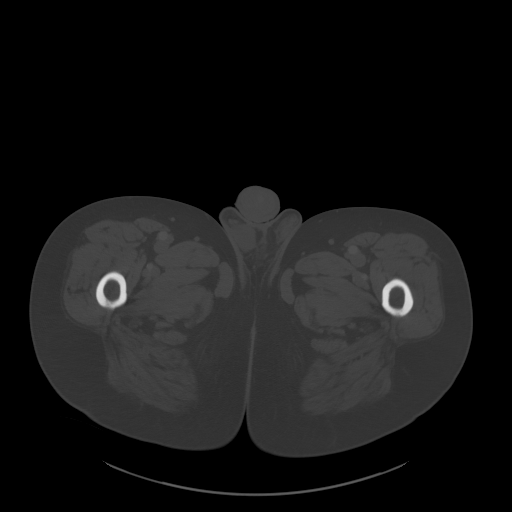
[im 11/98  soft-tissue]
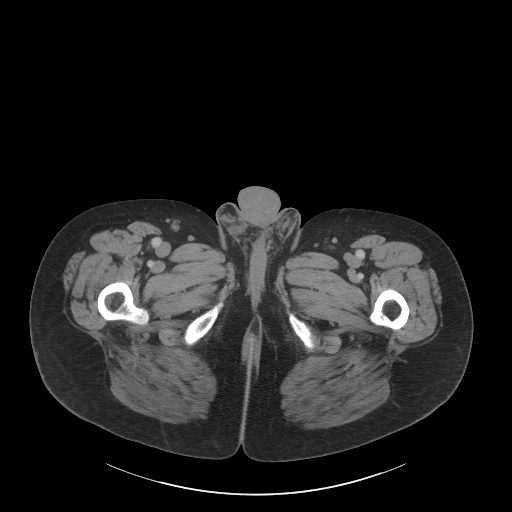
[im 22/98  soft-tissue]
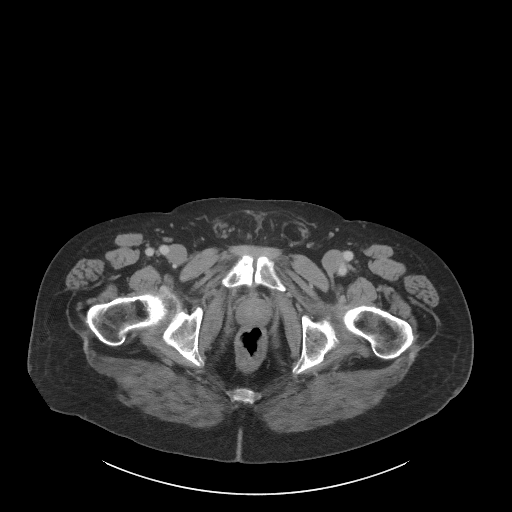
[im 27/98  soft-tissue]
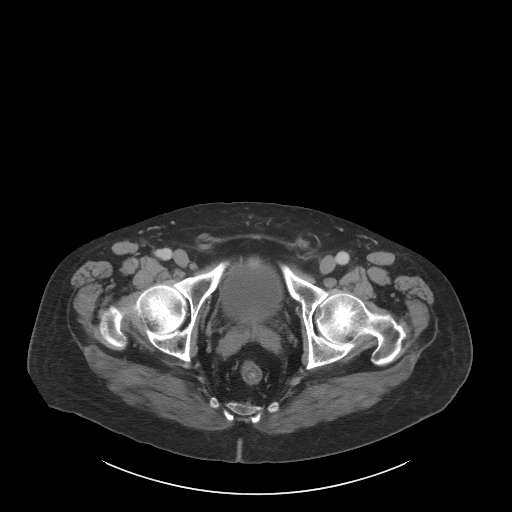
[im 33/98  soft-tissue]
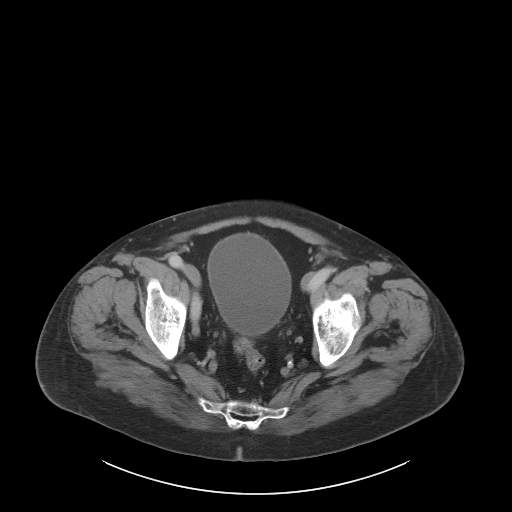
[im 44/98  soft-tissue]
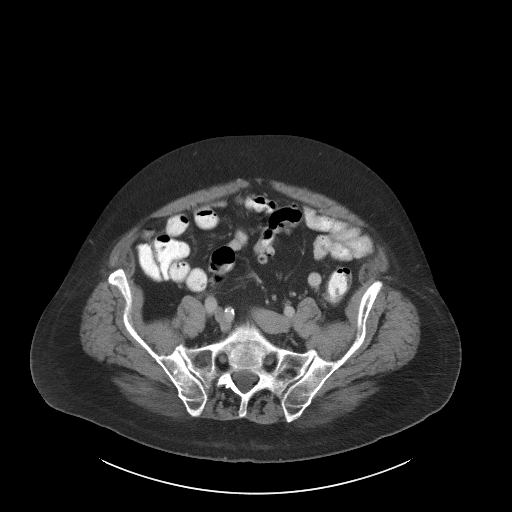
[im 49/98  soft-tissue]
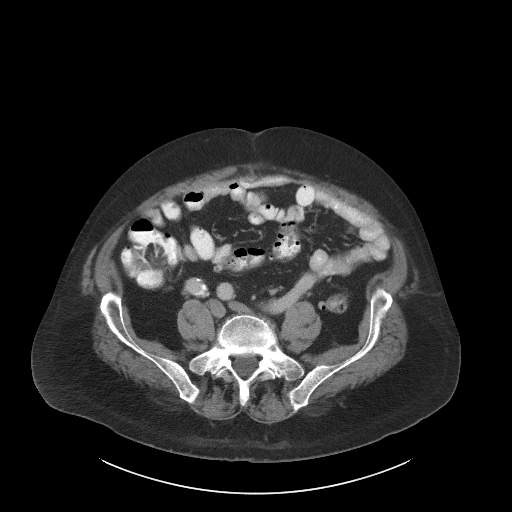
[im 54/98  soft-tissue]
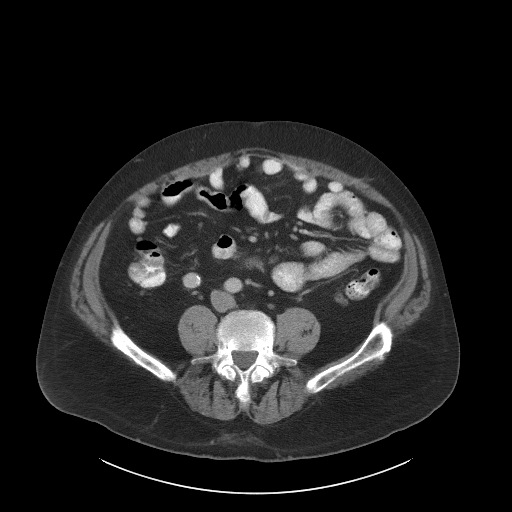
[im 65/98  soft-tissue]
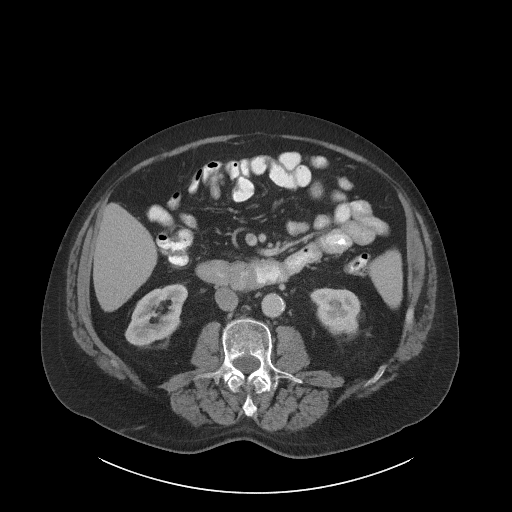
[im 65/98  bone]
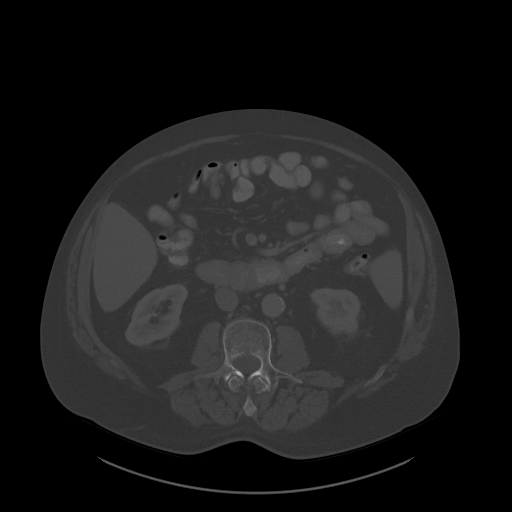
[im 71/98  soft-tissue]
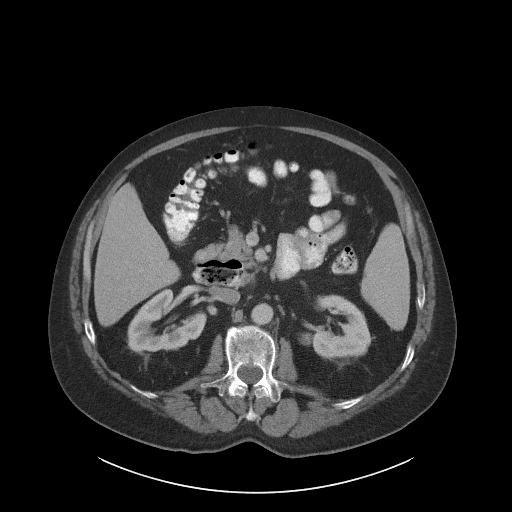
[im 76/98  soft-tissue]
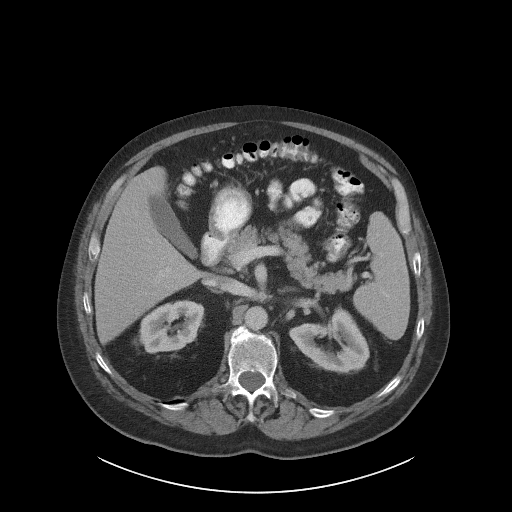
[im 87/98  soft-tissue]
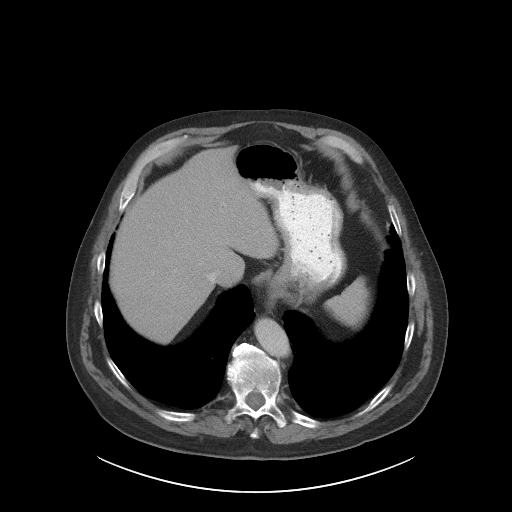
[im 92/98  soft-tissue]
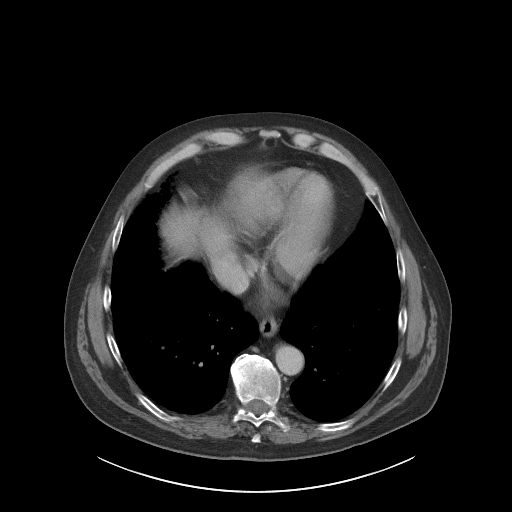

[Series 5: coronal st · coronal · 0.85mm/px · 3 of 111 slices shown]
[im 37/111  soft-tissue]
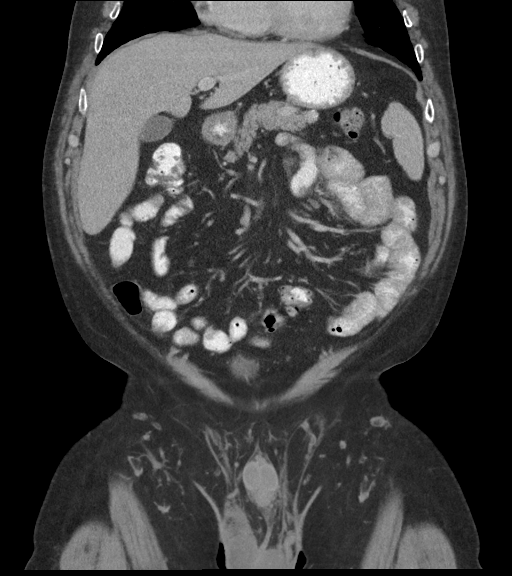
[im 49/111  soft-tissue]
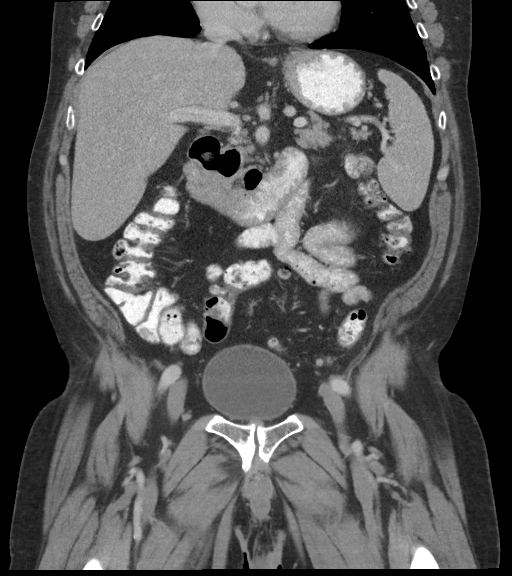
[im 62/111  soft-tissue]
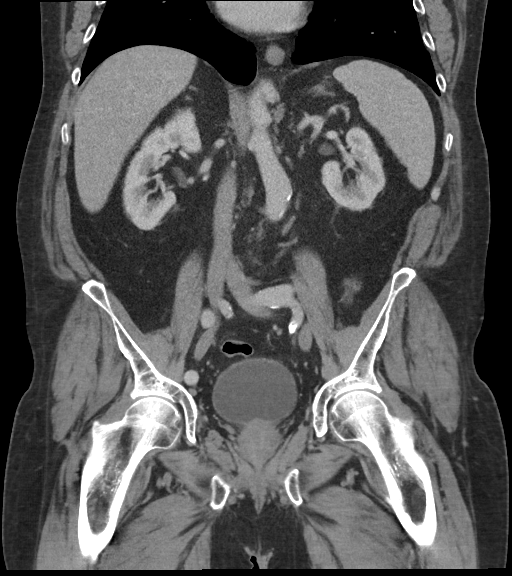

[16 of 46 positions shown; findings below may reference images not displayed]

FINDINGS: Lower chest: No acute abnormality. Cardiomegaly. Coronary artery
calcifications.

Hepatobiliary: No solid liver abnormality is seen. No gallstones,
gallbladder wall thickening, or biliary dilatation.

Pancreas: Unremarkable. No pancreatic ductal dilatation or
surrounding inflammatory changes.

Spleen: Normal in size without significant abnormality.

Adrenals/Urinary Tract: Adrenal glands are unremarkable. Kidneys are
normal, without renal calculi, solid lesion, or hydronephrosis.
Bladder is unremarkable.

Stomach/Bowel: Stomach is within normal limits. Duodenal
diverticulum. Appendix appears normal. No evidence of bowel wall
thickening, distention, or inflammatory changes. Descending and
sigmoid diverticulosis.

Vascular/Lymphatic: Aortic atherosclerosis. No enlarged abdominal or
pelvic lymph nodes.

Reproductive: No mass or other significant abnormality.

Other: No abdominal wall hernia or abnormality. No abdominopelvic
ascites.

Musculoskeletal: No acute or significant osseous findings.
IMPRESSION: 1. No acute CT findings of the abdomen or pelvis to explain pain or
diarrhea. Specifically, no abnormality of the urinary bladder.
2. Descending and sigmoid diverticulosis without evidence of acute
diverticulitis.
3. Cardiomegaly and coronary artery disease.

Aortic Atherosclerosis ([OJ]-[OJ]).

## 2021-06-27 MED ORDER — IOHEXOL 300 MG/ML  SOLN
100.0000 mL | Freq: Once | INTRAMUSCULAR | Status: AC | PRN
  Administered 2021-06-27: 100 mL via INTRAVENOUS

## 2021-06-28 ENCOUNTER — Telehealth: Payer: Self-pay | Admitting: Family

## 2021-06-28 NOTE — Telephone Encounter (Signed)
Pt calling in regards to CT scans that were completed yesterday 12/1. Pt was advised the PCP has not yet read it, once it's read he will be called.

## 2021-07-01 ENCOUNTER — Encounter: Payer: Self-pay | Admitting: Family

## 2021-07-01 DIAGNOSIS — I700 Atherosclerosis of aorta: Secondary | ICD-10-CM | POA: Insufficient documentation

## 2021-07-01 NOTE — Telephone Encounter (Signed)
The patient is worrying about his CT results . Would like a call back today.

## 2021-07-03 NOTE — Telephone Encounter (Signed)
Pt was called and aware of results. See result note.

## 2021-07-16 ENCOUNTER — Ambulatory Visit: Payer: Commercial Managed Care - PPO | Admitting: Family

## 2021-07-16 ENCOUNTER — Encounter: Payer: Self-pay | Admitting: Family

## 2021-07-16 ENCOUNTER — Other Ambulatory Visit: Payer: Self-pay

## 2021-07-16 VITALS — BP 130/82 | HR 73 | Temp 97.6°F | Ht 72.0 in | Wt 226.0 lb

## 2021-07-16 DIAGNOSIS — G4733 Obstructive sleep apnea (adult) (pediatric): Secondary | ICD-10-CM | POA: Diagnosis not present

## 2021-07-16 DIAGNOSIS — E782 Mixed hyperlipidemia: Secondary | ICD-10-CM

## 2021-07-16 DIAGNOSIS — I4891 Unspecified atrial fibrillation: Secondary | ICD-10-CM | POA: Diagnosis not present

## 2021-07-16 DIAGNOSIS — I700 Atherosclerosis of aorta: Secondary | ICD-10-CM | POA: Diagnosis not present

## 2021-07-16 DIAGNOSIS — R102 Pelvic and perineal pain: Secondary | ICD-10-CM

## 2021-07-16 MED ORDER — ROSUVASTATIN CALCIUM 40 MG PO TABS: 40.0000 mg | ORAL_TABLET | Freq: Every day | ORAL | 3 refills | Status: DC

## 2021-07-16 MED ORDER — ROSUVASTATIN CALCIUM 40 MG PO TABS
40.0000 mg | ORAL_TABLET | Freq: Every day | ORAL | 3 refills | Status: DC
Start: 2021-07-16 — End: 2021-09-04

## 2021-07-16 NOTE — Assessment & Plan Note (Signed)
Resolved at this time. Will monitor.

## 2021-07-16 NOTE — Patient Instructions (Signed)
Please resume cholesterol medication as discussed Crestor 40 mg.  Please monitor for increased body aches or joint pain.  We will discuss this again in 6 weeks time.  I placed referral to pulmonology for evaluation of sleep apnea and to reorder the sleep study which was done in the past  Let us know if you dont hear back within a week in regards to an appointment being scheduled.

## 2021-07-16 NOTE — Assessment & Plan Note (Signed)
Patient agreeable to consult with pulmonology and to be retested for sleep apnea particularly in the setting of nocturia and h/o atrial fibrillation. Will follow

## 2021-07-16 NOTE — Assessment & Plan Note (Signed)
HR 73.  Currently asymptomatic.  He is compliant with losartan 50 mg, sotalol 120 mg daily. Follow up with Dr [name] next month. Will follow.

## 2021-07-16 NOTE — Progress Notes (Signed)
Subjective:    Patient ID: Jake Liu, male    DOB: 1961/06/17, 60 y.o.   MRN: [id number]  CC: Jake Liu is a 60 y.o. male who presents today for follow up.   HPI: Feels well today.  No new complaints  Suprapubic pain as resolved. No abdominal pain, flank pain, constipation.    Atherosclerosis- he hasnt resumed  rosuvastatin 40mg      Status post cardioversion after presenting to [facility] ED, subsequent admission, November 11,2022 ; following with [facility], Dr [name].  He is compliant with losartan 50 mg, sotalol 120 mg daily. No cp, palpitations.   Consult with Dr Bernardo Heater 05/10/21 regarding bladder fullness flank pain.  Renal ultrasound was unremarkable.  PSA 1 month ago 0.30 ( prior 0.24)  CT abdomen and pelvis significant for aortic atherosclerosis diverticulosis, cardiomegaly and coronary artery disease  Prediabetes  HISTORY:      Past Medical History:  Diagnosis Date   Anginal pain (HCC)    Arthritis    Atrial fibrillation (HCC)    Cardiomyopathy (HCC)    EF: 45%   CHF (congestive heart failure) (HCC)    Fatty liver    borderline   GERD (gastroesophageal reflux disease)    Headache    History of TIAs    Hypertension    Insomnia    Sciatica    Seronegative arthritis 09/27/2019   Sleep apnea    No CPAP   Past Surgical History:  Procedure Laterality Date   ABLATION N/A    for af   COLONOSCOPY     COLONOSCOPY WITH PROPOFOL N/A 10/13/2018   Procedure: COLONOSCOPY WITH PROPOFOL;  Surgeon: Toledo, [name], MD;  Location: [facility] ENDOSCOPY;  Service: Gastroenterology;  Laterality: N/A;   ESOPHAGOGASTRODUODENOSCOPY     ESOPHAGOGASTRODUODENOSCOPY (EGD) WITH PROPOFOL N/A 10/13/2018   Procedure: ESOPHAGOGASTRODUODENOSCOPY (EGD) WITH PROPOFOL;  Surgeon: Toledo, [name], MD;  Location: [facility] ENDOSCOPY;  Service: Gastroenterology;  Laterality: N/A;   HERNIA REPAIR     KNEE SURGERY Left    UMBILICAL HERNIA REPAIR N/A 12/05/2019   Procedure: LAPAROSCOPIC  UMBILICAL HERNIA REPAIR;  Surgeon: Alphonsa Overall, MD;  Location: [facility] ORS;  Service: General;  Laterality: N/A;   Family History  Problem Relation Age of Onset   Heart disease Mother 43   Asthma Father     Allergies: Sulfa antibiotics, Sulfasalazine, Bactrim [sulfamethoxazole-trimethoprim], Lisinopril, and Zetia [ezetimibe] Current Outpatient Medications on File Prior to Visit  Medication Sig Dispense Refill   Adalimumab 40 MG/0.4ML PNKT Inject into the skin. Twice a month     folic acid (FOLVITE) 1 MG tablet Take 1 tablet by mouth daily.     losartan (COZAAR) 50 MG tablet TAKE 1 TABLET BY MOUTH EVERY DAY 90 tablet 3   methotrexate (RHEUMATREX) 2.5 MG tablet Take 6 tablets by mouth once a week.     methotrexate 2.5 MG tablet Take 15 mg by mouth every Wednesday.     rivaroxaban (XARELTO) 20 MG TABS tablet Take 20 mg by mouth at bedtime.      sotalol (BETAPACE) 120 MG tablet Take 120 mg by mouth daily. 30 tablet 11   No current facility-administered medications on file prior to visit.    Social History   Tobacco Use   Smoking status: Former   Smokeless tobacco: Never  Scientific laboratory technician Use: Never used  Substance Use Topics   Alcohol use: Not Currently    Alcohol/week: 14.0 standard drinks    Types: 7 Glasses of wine,  7 Standard drinks or equivalent per week    Comment: drinks at night., bottle and half of red wine;  Drinks 10 cups coffee daily   Drug use: No    Review of Systems  Constitutional:  Negative for chills and fever.  Respiratory:  Negative for cough.   Cardiovascular:  Negative for chest pain and palpitations.  Gastrointestinal:  Negative for abdominal pain, nausea and vomiting.  Genitourinary:  Negative for flank pain.     Objective:    BP 130/82 (BP Location: Left Arm, Patient Position: Sitting, Cuff Size: Large)    Pulse 73    Temp 97.6 F (36.4 C) (Oral)    Ht 6' (1.829 m)    Wt 226 lb (102.5 kg)    SpO2 96%    BMI 30.65 kg/m  BP Readings from Last 3  Encounters:  07/16/21 130/82  06/03/21 114/70  05/10/21 (!) 140/92   Wt Readings from Last 3 Encounters:  07/16/21 226 lb (102.5 kg)  06/03/21 220 lb (99.8 kg)  05/10/21 216 lb (98 kg)    Physical Exam Vitals reviewed.  Constitutional:      Appearance: He is well-developed.  Cardiovascular:     Rate and Rhythm: Regular rhythm.     Heart sounds: Normal heart sounds.  Pulmonary:     Effort: Pulmonary effort is normal. No respiratory distress.     Breath sounds: Normal breath sounds. No wheezing, rhonchi or rales.  Skin:    General: Skin is warm and dry.  Neurological:     Mental Status: He is alert.  Psychiatric:        Speech: Speech normal.        Behavior: Behavior normal.       Assessment & Plan:   Problem List Items Addressed This Visit       Cardiovascular and Mediastinum   Aortic atherosclerosis ([place])    Strongly encouraged for him to resume atorvastatin 40 mg which he was in agreement with.  Repeat CMP when he returns for follow-up in 6 weeks. Advised to monitor for reoccurrence myalgia.       Relevant Medications   rosuvastatin (CRESTOR) 40 MG tablet   Atrial fibrillation status post cardioversion (HCC)    HR 73.  Currently asymptomatic.  He is compliant with losartan 50 mg, sotalol 120 mg daily. Follow up with Dr [name] next month. Will follow.       Relevant Medications   rosuvastatin (CRESTOR) 40 MG tablet     Respiratory   Obstructive sleep apnea - Primary    Patient agreeable to consult with pulmonology and to be retested for sleep apnea particularly in the setting of nocturia and h/o atrial fibrillation. Will follow      Relevant Orders   Ambulatory referral to Pulmonology     Other   Hyperlipidemia   Relevant Medications   rosuvastatin (CRESTOR) 40 MG tablet   Other Relevant Orders   Comprehensive metabolic panel   Suprapubic pain    Resolved at this time. Will monitor.         I am having Jake Liu maintain his rivaroxaban,  methotrexate, Adalimumab, sotalol, folic acid, methotrexate, losartan, and rosuvastatin.   Meds ordered this encounter  Medications   DISCONTD: rosuvastatin (CRESTOR) 40 MG tablet    Sig: Take 1 tablet (40 mg total) by mouth daily.    Dispense:  90 tablet    Refill:  3    Order Specific Question:   Supervising Provider  Answer:   Deborra Medina L [[id number]]   rosuvastatin (CRESTOR) 40 MG tablet    Sig: Take 1 tablet (40 mg total) by mouth daily.    Dispense:  90 tablet    Refill:  3    Order Specific Question:   Supervising Provider    Answer:   Crecencio Mc [[id number]]    Return precautions given.   Risks, benefits, and alternatives of the medications and treatment plan prescribed today were discussed, and patient expressed understanding.   Education regarding symptom management and diagnosis given to patient on AVS.  Continue to follow with Burnard Hawthorne, FNP for routine health maintenance.   Jake Liu and I agreed with plan.   [name], FNP

## 2021-07-16 NOTE — Assessment & Plan Note (Signed)
Strongly encouraged for him to resume atorvastatin 40 mg which he was in agreement with.  Repeat CMP when he returns for follow-up in 6 weeks. Advised to monitor for reoccurrence myalgia.

## 2021-08-30 ENCOUNTER — Telehealth: Payer: Self-pay | Admitting: Family

## 2021-08-30 NOTE — Telephone Encounter (Signed)
Patient called in regards to his medication. Patient states he has some questions and concerns regarding current medications. Patient wanted to schedule a visit to be seen sooner than his scheduled time on 2/21. Please advise.

## 2021-09-02 NOTE — Telephone Encounter (Signed)
FYI I have scheduled patient for Wednesday at 9:30 to be seen. When I called patient he had concerns of high BP. He did not have any readings to give but said that he can feel in his head like pressure/HA when gets too high. He said that when he saw is cardiologist at [facility] was advised to f/u with Dr. [name] & discuss his cholesterol as well a medication. I scheduled patient with Dr. [name] 2/17 at 8:20 & LM letting him know this. I did advise ED if any signs/sx before Wednesday of heart attack such as CP, SOB, left arm pain/numbness tingling, vision changes he needed to be seen ASAP.

## 2021-09-04 ENCOUNTER — Other Ambulatory Visit: Payer: Self-pay

## 2021-09-04 ENCOUNTER — Encounter: Payer: Self-pay | Admitting: Family

## 2021-09-04 ENCOUNTER — Ambulatory Visit: Payer: Commercial Managed Care - PPO | Admitting: Family

## 2021-09-04 VITALS — BP 118/84 | HR 87 | Temp 97.6°F | Ht 72.0 in | Wt 224.8 lb

## 2021-09-04 DIAGNOSIS — I4891 Unspecified atrial fibrillation: Secondary | ICD-10-CM

## 2021-09-04 DIAGNOSIS — I10 Essential (primary) hypertension: Secondary | ICD-10-CM | POA: Diagnosis not present

## 2021-09-04 DIAGNOSIS — E782 Mixed hyperlipidemia: Secondary | ICD-10-CM | POA: Diagnosis not present

## 2021-09-04 DIAGNOSIS — I700 Atherosclerosis of aorta: Secondary | ICD-10-CM

## 2021-09-04 DIAGNOSIS — N529 Male erectile dysfunction, unspecified: Secondary | ICD-10-CM

## 2021-09-04 MED ORDER — SILDENAFIL CITRATE 50 MG PO TABS: 25.0000 mg | ORAL_TABLET | Freq: Every day | ORAL | 3 refills | Status: DC | PRN

## 2021-09-04 MED ORDER — ROSUVASTATIN CALCIUM 40 MG PO TABS: 40.0000 mg | ORAL_TABLET | Freq: Every day | ORAL | 3 refills | Status: DC

## 2021-09-04 NOTE — Patient Instructions (Signed)
I have sent a refill of viagra.  Please read below regarding medication.   I have also sent a refill of atorvastatin 40 mg.  Please start rosuvastatin by taking once per week and gradually increase as discussed.  Please monitor for any muscle or joint aches.  Sildenafil Tablets (Erectile Dysfunction) What is this medication? SILDENAFIL (sil DEN a fil) treats erectile dysfunction (ED). It works by increasing blood flow to the penis, which helps to maintain an erection. This medicine may be used for other purposes; ask your health care provider or pharmacist if you have questions. COMMON BRAND NAME(S): Viagra What should I tell my care team before I take this medication? They need to know if you have any of these conditions: Bleeding disorders Eye or vision problems, including a rare inherited eye disease called retinitis pigmentosa Anatomical deformation of the penis, Peyronie's disease, or history of priapism (painful and prolonged erection) Heart disease, angina, a history of heart attack, irregular heartbeats, or other heart problems High or low blood pressure History of blood diseases, like sickle cell anemia or leukemia History of stomach bleeding Kidney disease Liver disease Stroke An unusual or allergic reaction to sildenafil, other medications, foods, dyes, or preservatives Pregnant or trying to get pregnant Breast-feeding How should I use this medication? Take this medication by mouth with a glass of water. Follow the directions on the prescription label. The dose is usually taken 1 hour before sexual activity. You should not take the dose more than once per day. Do not take your medication more often than directed. Talk to your care team about the use of this medication in children. This medication is not used in children for this condition. Overdosage: If you think you have taken too much of this medicine contact a poison control center or emergency room at once. NOTE: This  medicine is only for you. Do not share this medicine with others. What if I miss a dose? This does not apply. Do not take double or extra doses. What may interact with this medication? Do not take this medication with any of the following: Cisapride Nitrates like amyl nitrite, isosorbide dinitrate, isosorbide mononitrate, nitroglycerin Riociguat This medication may also interact with the following: Antiviral medications for HIV or AIDS Bosentan Certain medications for benign prostatic hyperplasia (BPH) Certain medications for blood pressure Certain medications for fungal infections like ketoconazole and itraconazole Cimetidine Erythromycin Rifampin This list may not describe all possible interactions. Give your health care provider a list of all the medicines, herbs, non-prescription drugs, or dietary supplements you use. Also tell them if you smoke, drink alcohol, or use illegal drugs. Some items may interact with your medicine. What should I watch for while using this medication? If you notice any changes in your vision while taking this medication, call your care team as soon as possible. Stop using this medication and call your care team right away if you have a loss of sight in one or both eyes. Contact your care team right away if you have an erection that lasts longer than 4 hours or if it becomes painful. This may be a sign of a serious problem and must be treated right away to prevent permanent damage. If you experience symptoms of nausea, dizziness, chest pain or arm pain upon initiation of sexual activity after taking this medication, you should refrain from further activity and call your care team as soon as possible. Do not drink alcohol to excess (examples, 5 glasses of wine or 5 shots  of whiskey) when taking this medication. When taken in excess, alcohol can increase your chances of getting a headache or getting dizzy, increasing your heart rate or lowering your blood  pressure. Using this medication does not protect you or your partner against HIV infection (the virus that causes AIDS) or other sexually transmitted diseases. What side effects may I notice from receiving this medication? Side effects that you should report to your care team as soon as possible: Allergic reactions--skin rash, itching, hives, swelling of the face, lips, tongue, or throat Hearing loss or ringing in ears Heart attack--pain or tightness in the chest, shoulders, arms, or jaw, nausea, shortness of breath, cold or clammy skin, feeling faint or lightheaded Heart rhythm changes--fast or irregular heartbeat, dizziness, feeling faint or lightheaded, chest pain, trouble breathing Low blood pressure--dizziness, feeling faint or lightheaded, blurry vision New or worsening shortness of breath Prolonged or painful erection Stroke--sudden numbness or weakness of the face, arm, or leg, trouble speaking, confusion, trouble walking, loss of balance or coordination, dizziness, severe headache, change in vision Sudden vision loss in one or both eyes Side effects that usually do not require medical attention (report to your care team if they continue or are bothersome): Facial flushing or redness Headache Nosebleed Runny or stuffy nose Trouble sleeping Upset stomach This list may not describe all possible side effects. Call your doctor for medical advice about side effects. You may report side effects to FDA at 1-800-FDA-1088. Where should I keep my medication? Keep out of reach of children and pets. Store at room temperature between 15 and 30 degrees C (59 and 86 degrees F). Throw away any unused medication after the expiration date. NOTE: This sheet is a summary. It may not cover all possible information. If you have questions about this medicine, talk to your doctor, pharmacist, or health care provider.  2022 Elsevier/Gold Standard (2020-09-07 00:00:00)

## 2021-09-04 NOTE — Progress Notes (Signed)
Subjective:    Patient ID: Jake Liu, male    DOB: [birth year], 61 y.o.   MRN: [id number]  CC: Jake Liu is a 61 y.o. male who presents today for an acute visit.    HPI: He requested refill of viagra He has been on in the past which has been helpful for erection. No h/o MI or CVA He is not on nitrate.   Here today due to concerns for elevated blood pressure.  Blood pressure has improved and he is feeling well today.  He has no complaints.  He is working on being healthier and plans to get back into the gym.  He is also drinking less alcohol.    He sees Dr [name] 09/13/21  He was called 3 times for pulmonology for OSA evaluation.   He is not taking losartan. He stopped rosuvastatin as had arthralgia which occurred in the past He is compliant with sotalol 120 mg once daily versus BID as prescribed as when taking he noticed impotence when taking.  He has decided not to have ablation at this time.  No chest pain, shortness of breath or palpitations  Follows with [facility], Dr [name].  Consult 08/01/2021 persistent atrial fibrillation whom stated he was at risk of recurrent AF  He is taking a half dose sotalol BID and advised to take as directed.    Rheumatoid arthritis-continues to follow with rheumatology, Dr patel.  He is compliant with methotrexate.  Denies any joint pain or swelling at this time   HISTORY:  Past Medical History:  Diagnosis Date   Anginal pain ([place])    Arthritis    Atrial fibrillation ([place])    Cardiomyopathy (HCC)    EF: 45%   CHF (congestive heart failure) (HCC)    Fatty liver    borderline   GERD (gastroesophageal reflux disease)    Headache    History of TIAs    Hypertension    Insomnia    Sciatica    Seronegative arthritis 09/27/2019   Sleep apnea    No CPAP   Past Surgical History:  Procedure Laterality Date   ABLATION N/A    for af   COLONOSCOPY     COLONOSCOPY WITH PROPOFOL N/A 10/13/2018   Procedure: COLONOSCOPY WITH PROPOFOL;  Surgeon:  Toledo, [name], MD;  Location: [facility] ENDOSCOPY;  Service: Gastroenterology;  Laterality: N/A;   ESOPHAGOGASTRODUODENOSCOPY     ESOPHAGOGASTRODUODENOSCOPY (EGD) WITH PROPOFOL N/A 10/13/2018   Procedure: ESOPHAGOGASTRODUODENOSCOPY (EGD) WITH PROPOFOL;  Surgeon: Toledo, [name], MD;  Location: [facility];  Service: Gastroenterology;  Laterality: N/A;   HERNIA REPAIR     KNEE SURGERY Left    UMBILICAL HERNIA REPAIR N/A 12/05/2019   Procedure: LAPAROSCOPIC UMBILICAL HERNIA REPAIR;  Surgeon: Alphonsa Overall, MD;  Location: [facility];  Service: General;  Laterality: N/A;   Family History  Problem Relation Age of Onset   Heart disease Mother 41   Asthma Father     Allergies: Sulfa antibiotics, Sulfasalazine, Bactrim [sulfamethoxazole-trimethoprim], Lisinopril, and Zetia [ezetimibe] Current Outpatient Medications on File Prior to Visit  Medication Sig Dispense Refill   Adalimumab 40 MG/0.4ML PNKT Inject into the skin. Twice a month     folic acid (FOLVITE) 1 MG tablet Take 1 tablet by mouth daily.     methotrexate (RHEUMATREX) 2.5 MG tablet Take 6 tablets by mouth once a week.     methotrexate 2.5 MG tablet Take 15 mg by mouth every Wednesday.     rivaroxaban (XARELTO) 20 MG  TABS tablet Take 20 mg by mouth at bedtime.      sotalol (BETAPACE) 120 MG tablet Take 120 mg by mouth daily. 30 tablet 11   No current facility-administered medications on file prior to visit.    Social History   Tobacco Use   Smoking status: Former   Smokeless tobacco: Never  Scientific laboratory technician Use: Never used  Substance Use Topics   Alcohol use: Not Currently    Alcohol/week: 14.0 standard drinks    Types: 7 Glasses of wine, 7 Standard drinks or equivalent per week    Comment: drinks at night., bottle and half of red wine;  Drinks 10 cups coffee daily   Drug use: No    Review of Systems  Constitutional:  Negative for chills and fever.  Respiratory:  Negative for cough.   Cardiovascular:  Negative for  chest pain and palpitations.  Gastrointestinal:  Negative for nausea and vomiting.     Objective:    BP 118/84 (BP Location: Left Arm, Patient Position: Sitting, Cuff Size: Large)    Pulse 87    Temp 97.6 F (36.4 C) (Oral)    Ht 6' (1.829 m)    Wt 224 lb 12.8 oz (102 kg)    SpO2 97%    BMI 30.49 kg/m    Physical Exam Vitals reviewed.  Constitutional:      Appearance: He is well-developed.  Cardiovascular:     Rate and Rhythm: Regular rhythm.     Heart sounds: Normal heart sounds.  Pulmonary:     Effort: Pulmonary effort is normal. No respiratory distress.     Breath sounds: Normal breath sounds. No wheezing, rhonchi or rales.  Skin:    General: Skin is warm and dry.  Neurological:     Mental Status: He is alert.  Psychiatric:        Speech: Speech normal.        Behavior: Behavior normal.       Assessment & Plan:    Problem List Items Addressed This Visit       Cardiovascular and Mediastinum   Aortic atherosclerosis ([place])    The 10-year ASCVD risk score (Tayloranne Lekas DK, et al., 2019) is: 9.3%.  Advised based on ASCVD risk score and known atherosclerosis, and strongly recommend that we trial rosuvastatin 20 mg.  Previously he had arthralgias which we suspected from this medication however at the same time he was diagnosed with rheumatoid arthritis.  He has been compliant with Humira and currently symptom-free.  Advised to start rosuvastatin once per week and gradually increase.  He would also like to discussed this with Dr. Donivan Scull at upcoming visit      Relevant Medications   rosuvastatin (CRESTOR) 40 MG tablet   Atrial fibrillation status post cardioversion [facility])    Symptomatically stable today.  He will continue following with EP Dr. [name].  He also has upcoming follow-up with cardiology, Dr. [name].  will follow      Relevant Medications   rosuvastatin (CRESTOR) 40 MG tablet   Hypertension    Pressure well controlled today.  He is currently not on losartan and I am  not sure why he is stop this medication.  I have opted not to restart as blood pressure is well controlled today.  Patient was contacted bby pulmonology for OSA evaluation.  He has not had consult.  I will repeat iterate the importance of this. we will continue to follow blood pressure  Relevant Medications   rosuvastatin (CRESTOR) 40 MG tablet     Other   Erectile dysfunction - Primary    Patient has done well on Cialis and Viagra.  He prefers Viagra.  No history of MI, CVA.  He is not on nitrates. Hopeful that by having rx for viagra he may be more compelled to take sotalol 120mg  BID as prescribed and recommended by Dr [name], EP, versus QD as he is currently doing due to sexual side effects.       Hyperlipidemia   Relevant Medications   rosuvastatin (CRESTOR) 40 MG tablet     I have discontinued Violet Nier's losartan and sildenafil. I am also having him maintain his rivaroxaban, methotrexate, Adalimumab, sotalol, folic acid, methotrexate, and rosuvastatin.   Meds ordered this encounter  Medications   DISCONTD: rosuvastatin (CRESTOR) 40 MG tablet    Sig: Take 1 tablet (40 mg total) by mouth daily.    Dispense:  90 tablet    Refill:  3    Order Specific Question:   Supervising Provider    Answer:   Crecencio Mc [[id number]]   DISCONTD: sildenafil (VIAGRA) 50 MG tablet    Sig: Take 0.5 tablets (25 mg total) by mouth daily as needed for erectile dysfunction (max 100mg /dose up to 1 dose/day. Give 0.5-4h before sexual activity).    Dispense:  10 tablet    Refill:  3    Order Specific Question:   Supervising Provider    Answer:   Crecencio Mc [[id number]]   DISCONTD: sildenafil (VIAGRA) 50 MG tablet    Sig: Take 0.5 tablets (25 mg total) by mouth daily as needed for erectile dysfunction (max 100mg /dose up to 1 dose/day. Give 0.5-4h before sexual activity).    Dispense:  10 tablet    Refill:  3    Order Specific Question:   Supervising Provider    Answer:   Deborra Medina L [[id number]]    rosuvastatin (CRESTOR) 40 MG tablet    Sig: Take 1 tablet (40 mg total) by mouth daily.    Dispense:  90 tablet    Refill:  3    Order Specific Question:   Supervising Provider    Answer:   Crecencio Mc [[id number]]    Return precautions given.   Risks, benefits, and alternatives of the medications and treatment plan prescribed today were discussed, and patient expressed understanding.   Education regarding symptom management and diagnosis given to patient on AVS.  Continue to follow with Burnard Hawthorne, FNP for routine health maintenance.   [name] and I agreed with plan.   [name], FNP

## 2021-09-04 NOTE — Assessment & Plan Note (Addendum)
The 10-year ASCVD risk score (Amandalynn Pitz DK, et al., 2019) is: 9.3%.  Advised based on ASCVD risk score and known atherosclerosis, and strongly recommend that we trial rosuvastatin 20 mg.  Previously he had arthralgias which we suspected from this medication however at the same time he was diagnosed with rheumatoid arthritis.  He has been compliant with Humira and currently symptom-free.  Advised to start rosuvastatin once per week and gradually increase.  He would also like to discussed this with Dr. Donivan Scull at upcoming visit

## 2021-09-04 NOTE — Assessment & Plan Note (Signed)
Patient has done well on Cialis and Viagra.  He prefers Viagra.  No history of MI, CVA.  He is not on nitrates. Hopeful that by having rx for viagra he may be more compelled to take sotalol 120mg  BID as prescribed and recommended by Dr [name], EP, versus QD as he is currently doing due to sexual side effects.

## 2021-09-04 NOTE — Assessment & Plan Note (Addendum)
Pressure well controlled today.  He is currently not on losartan and I am not sure why he is stop this medication.  I have opted not to restart as blood pressure is well controlled today.  Patient was contacted bby pulmonology for OSA evaluation.  He has not had consult.  I will repeat iterate the importance of this. we will continue to follow blood pressure

## 2021-09-04 NOTE — Assessment & Plan Note (Signed)
Symptomatically stable today.  He will continue following with EP Dr. [name].  He also has upcoming follow-up with cardiology, Dr. [name].  will follow

## 2021-09-12 NOTE — Progress Notes (Signed)
Cardiology Office Note  Date:  09/13/2021   ID:  Jake Liu, DOB 09/03/1960, MRN [id number]  PCP:  Burnard Hawthorne, FNP   Chief Complaint  Patient presents with   12 month follow up     Patient is unable to tolerate Crestor due to muscle/joint pain. Medications reviewed by the patient verbally.     HPI:  [name] is a 61 year old gentleman with past medical history of Atrial fibrillation, cardioversion June 2013, repeat July 2014, Normal ejection fraction on transesophageal echo July 2014 History of ablation, 2017 Smokes cigars Hypertension TIA Stress at work/anxiety, drinks wine Obstructive sleep apnea Coronary calcium score of 1228. In 2020 seronegative RA followed by rheumatology. Alcohol Who presents for follow-up of his hypertension, atrial fibrillation  Last seen in clinic April 2022 Reported 3 to 4 glasses of red wine every night for dinner, more with dinner trips taking sotalol 120 mg daily in the p.m., none in the a.m.  Heavy drinking night last night, after dinner box of hard alcohol and glasses of wine Today in atrial fibrillation today rate 120 Sweating this Am, still feels mildly diaphoretic otherwise reports feeling okay Had 3 cups coffee this morning, no sotalol this morning, does not check his heart rate  Last week with PMD, rate 87  Quit statin on his own Was represcribed by primary care last week, still not started it wonders whether he needs to Total cholesterol 250  Reports he is followed by Dr. [name], EP at [facility] " Dr. [name] told me to take sotalol twice a day"  EKG personally reviewed by myself on todays visit Atrial fibrillation rate 120 bpm   Past medical history reviewed seen in the office by Laurann Montana 09/07/2020 : increasing fatigue.  EKG showed atrial fibrillation with a rate of 98.  self-decreased his sotalol 120 mg from the prescribed twice a day to once a day about 5 months ago because he felt very tired on twice a  day dosing.  advised him to increase to twice daily dosing.  starting taking sotalol 1/2 tablet (60mg ) sotalol in the morning and 120mg  in the evening.  wants to avoid higher-risk AADs but doesn't feel well on sotalol twice daily. He prefers to avoid repeat ablation if possible, but wishes to proceed with DCCV.  Procedure 09/17/20, cardioversion No ablation recently   hospitalization May 2021 Had hernia repair Before getting into the hospital had gone into atrial fibrillation presumably secondary to the pain Placed on Cardizem infusion, metoprolol increased  In hospital [facility] 4 days: cardioversion metoprolol to sotalol On xarelto  Seen by [facility] cardiology as outpatient last month, treadmill stress echo ordered, has not been completed yet  Does not know what medications he is taking, unclear if he is on Zetia or Crestor  Past medical history reviewed Echo 09/2017, outside facility Normal LV function, no significant valvular disease Dilated aortic root, 3.9 cm  Carotid Doppler, normal 2014  2013,  Myoview showing no ischemia   PMH:   has a past medical history of Anginal pain ([place]), Arthritis, Atrial fibrillation ([place]), Cardiomyopathy ([place]), CHF (congestive heart failure) ([place]), Fatty liver, GERD (gastroesophageal reflux disease), Headache, History of TIAs, Hypertension, Insomnia, Sciatica, Seronegative arthritis (09/27/2019), and Sleep apnea.  PSH:    Past Surgical History:  Procedure Laterality Date   ABLATION N/A    for af   COLONOSCOPY     COLONOSCOPY WITH PROPOFOL N/A 10/13/2018   Procedure: COLONOSCOPY WITH PROPOFOL;  Surgeon: Alice Reichert, [name], MD;  Location: [facility] ENDOSCOPY;  Service: Gastroenterology;  Laterality: N/A;   ESOPHAGOGASTRODUODENOSCOPY     ESOPHAGOGASTRODUODENOSCOPY (EGD) WITH PROPOFOL N/A 10/13/2018   Procedure: ESOPHAGOGASTRODUODENOSCOPY (EGD) WITH PROPOFOL;  Surgeon: Toledo, [name], MD;  Location: [facility] ENDOSCOPY;  Service: Gastroenterology;  Laterality:  N/A;   HERNIA REPAIR     KNEE SURGERY Left    UMBILICAL HERNIA REPAIR N/A 12/05/2019   Procedure: LAPAROSCOPIC UMBILICAL HERNIA REPAIR;  Surgeon: Alphonsa Overall, MD;  Location: [facility];  Service: General;  Laterality: N/A;    Current Outpatient Medications  Medication Sig Dispense Refill   Adalimumab 40 MG/0.4ML PNKT Inject into the skin. Twice a month     methotrexate (RHEUMATREX) 2.5 MG tablet Take 6 tablets by mouth once a week.     rivaroxaban (XARELTO) 20 MG TABS tablet Take 20 mg by mouth at bedtime.      sildenafil (VIAGRA) 50 MG tablet Take 0.5 tablets (25 mg total) by mouth daily as needed for erectile dysfunction (max 100mg /dose up to 1 dose/day. Give 0.5-4h before sexual activity). 10 tablet 3   folic acid (FOLVITE) 1 MG tablet Take 1 tablet by mouth daily. (Patient not taking: Reported on 09/13/2021)     methotrexate 2.5 MG tablet Take 15 mg by mouth every Wednesday. (Patient not taking: Reported on 09/13/2021)     sotalol (BETAPACE) 120 MG tablet Take 120 mg by mouth daily. (Patient not taking: Reported on 09/13/2021) 30 tablet 11   No current facility-administered medications for this visit.    Allergies:   Sulfa antibiotics, Sulfasalazine, Bactrim [sulfamethoxazole-trimethoprim], Lisinopril, and Zetia [ezetimibe]   Social History:  The patient  reports that he has quit smoking. He has never used smokeless tobacco. He reports that he does not currently use alcohol after a past usage of about 14.0 standard drinks per week. He reports that he does not use drugs.   Family History:   family history includes Asthma in his father; Heart disease (age of onset: 23) in his mother.    Review of Systems: Review of Systems  Constitutional:  Positive for diaphoresis.  Respiratory: Negative.    Cardiovascular: Negative.   Gastrointestinal: Negative.   Musculoskeletal: Negative.   Neurological: Negative.   Psychiatric/Behavioral: Negative.    All other systems reviewed and are  negative.   PHYSICAL EXAM: VS:  BP 130/88 (BP Location: Left Arm, Patient Position: Sitting, Cuff Size: Normal)    Pulse (!) 120    Ht 6' (1.829 m)    Wt 225 lb 6 oz (102.2 kg)    SpO2 98%    BMI 30.57 kg/m  , BMI Body mass index is 30.57 kg/m. Constitutional:  oriented to person, place, and time. No distress.  Feels clammy HENT:  Head: Grossly normal Eyes:  no discharge. No scleral icterus.  Neck: No JVD, no carotid bruits  Cardiovascular: Irregularly irregular, rapid, no murmurs appreciated Pulmonary/Chest: Clear to auscultation bilaterally, no wheezes or rails Abdominal: Soft.  no distension.  no tenderness.  Musculoskeletal: Normal range of motion Neurological:  normal muscle tone. Coordination normal. No atrophy Skin: Skin warm and dry Psychiatric: normal affect, pleasant   Recent Labs: 06/03/2021: ALT 23; BUN 19; Creatinine, Ser 0.89; Hemoglobin 15.5; Platelets 281.0; Potassium 4.7; Sodium 138; TSH 3.12   Lipid Panel Lab Results  Component Value Date   CHOL 255 (H) 11/28/2019   HDL 70 11/28/2019   LDLCALC 169 (H) 11/28/2019   TRIG 81 11/28/2019      Wt Readings from Last 3 Encounters:  09/13/21 225 lb 6 oz (102.2 kg)  09/04/21 224 lb 12.8 oz (102 kg)  07/16/21 226 lb (102.5 kg)      ASSESSMENT AND PLAN:  Atrial fibrillation status post cardioversion (HCC) Only taking sotalol in the p.m., was prescribed twice a day by EP In atrial fibrillation today Heavy drinking last night Recommend he take sotalol this morning and diltiazem 60 mg at noon and if rate remains above 110 Stressed the importance of taking sotalol twice a day, Xarelto daily Long discussion concerning risk factors for atrial fibrillation which include his drinking and weight Recommend if he does not convert to normal sinus rhythm early next week that he call our office or Dr. [name]  Coronary disease with stable angina Heavy coronary calcification, calcium scoring performed February 2020 Denies  anginal symptoms Previously ordered stress test, not completed Recommend he get back on his Crestor Continues to take himself off the medication  Essential hypertension Increase sotalol up to twice a day  Obstructive sleep apnea Weight high, lots of drinking Risks discussed with him  GAD (generalized anxiety disorder) Recommend cut back on his alcohol  Mixed hyperlipidemia Stay on Crestor   Total encounter time more than 40 minutes  Greater than 50% was spent in counseling and coordination of care with the patient   No orders of the defined types were placed in this encounter.    Signed, Esmond Plants, M.D., Ph.D. 09/13/2021  [place], [place]

## 2021-09-13 ENCOUNTER — Other Ambulatory Visit: Payer: Commercial Managed Care - PPO

## 2021-09-13 ENCOUNTER — Other Ambulatory Visit: Payer: Self-pay

## 2021-09-13 ENCOUNTER — Encounter: Payer: Self-pay | Admitting: Cardiovascular Disease

## 2021-09-13 ENCOUNTER — Ambulatory Visit (INDEPENDENT_AMBULATORY_CARE_PROVIDER_SITE_OTHER): Payer: Commercial Managed Care - PPO | Admitting: Cardiovascular Disease

## 2021-09-13 VITALS — BP 130/88 | HR 120 | Ht 72.0 in | Wt 225.4 lb

## 2021-09-13 DIAGNOSIS — I2510 Atherosclerotic heart disease of native coronary artery without angina pectoris: Secondary | ICD-10-CM | POA: Diagnosis not present

## 2021-09-13 DIAGNOSIS — G4733 Obstructive sleep apnea (adult) (pediatric): Secondary | ICD-10-CM

## 2021-09-13 DIAGNOSIS — I5022 Chronic systolic (congestive) heart failure: Secondary | ICD-10-CM

## 2021-09-13 DIAGNOSIS — I10 Essential (primary) hypertension: Secondary | ICD-10-CM | POA: Diagnosis not present

## 2021-09-13 DIAGNOSIS — I4819 Other persistent atrial fibrillation: Secondary | ICD-10-CM

## 2021-09-13 DIAGNOSIS — E785 Hyperlipidemia, unspecified: Secondary | ICD-10-CM

## 2021-09-13 MED ORDER — ROSUVASTATIN CALCIUM 40 MG PO TABS: 40.0000 mg | ORAL_TABLET | Freq: Every day | ORAL | 3 refills | Status: DC

## 2021-09-13 MED ORDER — DILTIAZEM HCL 60 MG PO TABS: 60.0000 mg | ORAL_TABLET | Freq: Three times a day (TID) | ORAL | 3 refills | Status: DC | PRN

## 2021-09-13 NOTE — Addendum Note (Signed)
Addended by: Wynema Birch on: 09/13/2021 10:36 AM   Modules accepted: Orders

## 2021-09-13 NOTE — Patient Instructions (Addendum)
Call office next week if still in atrial fib   Medication Instructions:  Please take these medications Crestor 40 mg daily for cholesterol Sotolol twice a day per Dr. [name]  Please START Diltiazem 60 mg as needed up to three times a day for rate > 110  If you need a refill on your cardiac medications before your next appointment, please call your pharmacy.   Lab work: No new labs needed  Testing/Procedures: No new testing needed  Follow-Up: At [facility], you and your health needs are our priority.  As part of our continuing mission to provide you with exceptional heart care, we have created designated Provider Care Teams.  These Care Teams include your primary Cardiologist (physician) and Advanced Practice Providers (APPs -  Physician Assistants and Nurse Practitioners) who all work together to provide you with the care you need, when you need it.  You will need a follow up appointment in 6 months  Providers on your designated Care Team:   Murray Hodgkins, NP Christell Faith, PA-C Cadence Kathlen Mody, [place]  COVID-19 Vaccine Information can be found at: [email or web address].uk For questions related to vaccine distribution or appointments, please email [email or web address] .[email or web address] or call [phone].

## 2021-09-17 ENCOUNTER — Ambulatory Visit: Payer: Commercial Managed Care - PPO | Admitting: Family

## 2021-09-17 ENCOUNTER — Encounter: Payer: Self-pay | Admitting: Family

## 2021-09-17 ENCOUNTER — Other Ambulatory Visit: Payer: Self-pay

## 2021-09-17 VITALS — BP 128/66 | HR 92 | Temp 97.9°F | Ht 72.0 in | Wt 228.8 lb

## 2021-09-17 DIAGNOSIS — I4891 Unspecified atrial fibrillation: Secondary | ICD-10-CM | POA: Diagnosis not present

## 2021-09-17 DIAGNOSIS — R0981 Nasal congestion: Secondary | ICD-10-CM | POA: Diagnosis not present

## 2021-09-17 DIAGNOSIS — I700 Atherosclerosis of aorta: Secondary | ICD-10-CM | POA: Diagnosis not present

## 2021-09-17 DIAGNOSIS — J029 Acute pharyngitis, unspecified: Secondary | ICD-10-CM

## 2021-09-17 LAB — POCT RAPID STREP A (OFFICE): Rapid Strep A Screen: NEGATIVE

## 2021-09-17 LAB — POCT INFLUENZA A/B
Influenza A, POC: NEGATIVE
Influenza B, POC: NEGATIVE

## 2021-09-17 LAB — POC COVID19 BINAXNOW: SARS Coronavirus 2 Ag: NEGATIVE

## 2021-09-17 NOTE — Patient Instructions (Addendum)
Recommend moving up appointment with Dr [name] Please continue to reconsider seeing pulmonology for discussion, evaluation of sleep apnea.  Please start Claritin if you continue to have nasal congestion and if no improvement, you may then stop Claritin and trial Mucinex DM (plain version not Mucinex D) to see if congestion will break up.     At any point, if you are concerned if symptoms worsening or you develop fever or overall not feeling well, please let me know as my concern would be that you have developed a bacterial infection.

## 2021-09-17 NOTE — Assessment & Plan Note (Signed)
Well-appearing.  Afebrile.  Duration 4 days.  Negative COVID, flu and strep today.  Advised him to start with Claritin due to history of seasonal allergies.  Also advised him to focus on sleep.  Discussed Mucinex DM to break up thick congestion.  Advised him to stay vigilant and if symptoms persist, most certainly worsen to let me know as I would be concerned he has developed a bacterial sinusitis which would warrant antibiotic.

## 2021-09-17 NOTE — Progress Notes (Signed)
Subjective:    Patient ID: Jake Liu, male    DOB: 24-Jul-1961, 61 y.o.   MRN: [id number]  CC: Jake Liu is a 61 y.o. male who presents today for follow up.   HPI: He complains of nasal congestion, x 4 days, unchanged.  He feels like it reminds him of his allergies.  He also thinks he may be rundown from dinners and long hours at work.  In the morning and late at night he has some thick congestion in the back of his throat. Endorses PND, hoarseness.  He has not tried any medication for this No fever, sob, cp, sore throat He is compliant with sotalol 120 mg twice daily, Xarelto 20 mg daily.  He has not taken the diltiazem to 60 mg up to 3 times a day as prescribed last week by Dr. [name] Declines seeing pulmonology at this time for osa evaluation   Cardiology follow up , Dr [name], 09/13/2021 for atrial fibrillation status post cardioversion.  Recommended to take sotalol twice per day  Hyperlipidemia-has resumed Crestor 40mg  and he is taking QOD without myalgia  HISTORY:  Past Medical History:  Diagnosis Date   Anginal pain ([place])    Arthritis    Atrial fibrillation (HCC)    Cardiomyopathy (HCC)    EF: 45%   CHF (congestive heart failure) (HCC)    Fatty liver    borderline   GERD (gastroesophageal reflux disease)    Headache    History of TIAs    Hypertension    Insomnia    Sciatica    Seronegative arthritis 09/27/2019   Sleep apnea    No CPAP   Past Surgical History:  Procedure Laterality Date   ABLATION N/A    for af   COLONOSCOPY     COLONOSCOPY WITH PROPOFOL N/A 10/13/2018   Procedure: COLONOSCOPY WITH PROPOFOL;  Surgeon: Toledo, [name], MD;  Location: [facility];  Service: Gastroenterology;  Laterality: N/A;   ESOPHAGOGASTRODUODENOSCOPY     ESOPHAGOGASTRODUODENOSCOPY (EGD) WITH PROPOFOL N/A 10/13/2018   Procedure: ESOPHAGOGASTRODUODENOSCOPY (EGD) WITH PROPOFOL;  Surgeon: Toledo, [name], MD;  Location: [facility];  Service: Gastroenterology;   Laterality: N/A;   HERNIA REPAIR     KNEE SURGERY Left    UMBILICAL HERNIA REPAIR N/A 12/05/2019   Procedure: LAPAROSCOPIC UMBILICAL HERNIA REPAIR;  Surgeon: Alphonsa Overall, MD;  Location: [facility];  Service: General;  Laterality: N/A;   Family History  Problem Relation Age of Onset   Heart disease Mother 41   Asthma Father     Allergies: Sulfa antibiotics, Sulfasalazine, Bactrim [sulfamethoxazole-trimethoprim], Lisinopril, and Zetia [ezetimibe] Current Outpatient Medications on File Prior to Visit  Medication Sig Dispense Refill   Adalimumab 40 MG/0.4ML PNKT Inject into the skin. Twice a month     diltiazem (CARDIZEM) 60 MG tablet Take 1 tablet (60 mg total) by mouth 3 (three) times daily as needed. For heart rate (a-fib) > 110 90 tablet 3   methotrexate (RHEUMATREX) 2.5 MG tablet Take 6 tablets by mouth once a week.     methotrexate 2.5 MG tablet Take 15 mg by mouth every Wednesday.     rivaroxaban (XARELTO) 20 MG TABS tablet Take 20 mg by mouth at bedtime.      rosuvastatin (CRESTOR) 40 MG tablet Take 1 tablet (40 mg total) by mouth daily. 90 tablet 3   sildenafil (VIAGRA) 50 MG tablet Take 0.5 tablets (25 mg total) by mouth daily as needed for erectile dysfunction (max 100mg /dose up to 1  dose/day. Give 0.5-4h before sexual activity). 10 tablet 3   sotalol (BETAPACE) 120 MG tablet Take 120 mg by mouth daily. 30 tablet 11   folic acid (FOLVITE) 1 MG tablet Take 1 tablet by mouth daily. (Patient not taking: Reported on 09/13/2021)     No current facility-administered medications on file prior to visit.    Social History   Tobacco Use   Smoking status: Former   Smokeless tobacco: Never  Scientific laboratory technician Use: Never used  Substance Use Topics   Alcohol use: Not Currently    Alcohol/week: 14.0 standard drinks    Types: 7 Glasses of wine, 7 Standard drinks or equivalent per week    Comment: drinks at night., bottle and half of red wine;  Drinks 10 cups coffee daily   Drug use: No     Review of Systems  Constitutional:  Negative for chills and fever.  HENT:  Positive for congestion and postnasal drip.   Respiratory:  Negative for cough and shortness of breath.   Cardiovascular:  Negative for chest pain and palpitations.  Gastrointestinal:  Negative for nausea and vomiting.     Objective:    BP 128/66 (BP Location: Left Arm, Patient Position: Sitting, Cuff Size: Normal)    Pulse 92    Temp 97.9 F (36.6 C) (Oral)    Ht 6' (1.829 m)    Wt 228 lb 12.8 oz (103.8 kg)    SpO2 96%    BMI 31.03 kg/m  BP Readings from Last 3 Encounters:  09/17/21 128/66  09/13/21 130/88  09/04/21 118/84   Wt Readings from Last 3 Encounters:  09/17/21 228 lb 12.8 oz (103.8 kg)  09/13/21 225 lb 6 oz (102.2 kg)  09/04/21 224 lb 12.8 oz (102 kg)    Physical Exam Vitals reviewed.  Constitutional:      Appearance: He is well-developed.  Cardiovascular:     Rate and Rhythm: Rhythm regularly irregular.     Heart sounds: Normal heart sounds.  Pulmonary:     Effort: Pulmonary effort is normal. No respiratory distress.     Breath sounds: Normal breath sounds. No wheezing, rhonchi or rales.  Skin:    General: Skin is warm and dry.  Neurological:     Mental Status: He is alert.  Psychiatric:        Speech: Speech normal.        Behavior: Behavior normal.       Assessment & Plan:   Problem List Items Addressed This Visit       Cardiovascular and Mediastinum   Aortic atherosclerosis ([place])    Patient has resumed Crestor 40 mg every other day without myalgia.  Advised to continue to slowly increase to QD. Will obtain liver enzymes at follow up.       Atrial fibrillation status post cardioversion [facility])    Chronic, symptomatically stable today.  Reiterated again importance of sleep apnea evaluation; he declines at this time and will consider this at a later date.  Discussed alcohol use and ways to decrease /eliminate.  He will remain compliant with sotalol 120 mg twice daily,  Xarelto 20 mg daily.  He has not used diltiazem 60 mg TID prn as prescribed last week by Dr. [name].  Advised him to call Dr. [name] to make earlier appointment if he would like to reconsider ablation for ongoing atrial fib.         Respiratory   Sinus congestion    Well-appearing.  Afebrile.  Duration 4 days.  Negative COVID, flu and strep today.  Advised him to start with Claritin due to history of seasonal allergies.  Also advised him to focus on sleep.  Discussed Mucinex DM to break up thick congestion.  Advised him to stay vigilant and if symptoms persist, most certainly worsen to let me know as I would be concerned he has developed a bacterial sinusitis which would warrant antibiotic.      Other Visit Diagnoses     Sore throat    -  Primary   Relevant Orders   POCT rapid strep A (Completed)   POCT Influenza A/B (Completed)   POC COVID-19 (Completed)        I am having [name] maintain his rivaroxaban, methotrexate, Adalimumab, sotalol, folic acid, methotrexate, sildenafil, rosuvastatin, and diltiazem.   No orders of the defined types were placed in this encounter.   Return precautions given.   Risks, benefits, and alternatives of the medications and treatment plan prescribed today were discussed, and patient expressed understanding.   Education regarding symptom management and diagnosis given to patient on AVS.  Continue to follow with Burnard Hawthorne, FNP for routine health maintenance.   [name] and I agreed with plan.   [name], FNP

## 2021-09-17 NOTE — Progress Notes (Signed)
FYI Pt declines an appointment. He stated that due to how much he moves around he does not want to be evaluated for CPAP as of yet.

## 2021-09-17 NOTE — Assessment & Plan Note (Addendum)
Patient has resumed Crestor 40 mg every other day without myalgia.  Advised to continue to slowly increase to QD. Will obtain liver enzymes at follow up.

## 2021-09-17 NOTE — Assessment & Plan Note (Signed)
Chronic, symptomatically stable today.  Reiterated again importance of sleep apnea evaluation; he declines at this time and will consider this at a later date.  Discussed alcohol use and ways to decrease /eliminate.  He will remain compliant with sotalol 120 mg twice daily, Xarelto 20 mg daily.  He has not used diltiazem 60 mg TID prn as prescribed last week by Dr. [name].  Advised him to call Dr. [name] to make earlier appointment if he would like to reconsider ablation for ongoing atrial fib.

## 2021-10-30 ENCOUNTER — Other Ambulatory Visit: Payer: Self-pay | Admitting: Cardiovascular Disease

## 2021-12-11 ENCOUNTER — Ambulatory Visit: Payer: Commercial Managed Care - PPO | Admitting: Family

## 2022-02-27 ENCOUNTER — Ambulatory Visit: Payer: Commercial Managed Care - PPO | Admitting: Family Medicine

## 2022-02-27 ENCOUNTER — Telehealth: Payer: Self-pay | Admitting: Family

## 2022-02-27 ENCOUNTER — Encounter: Payer: Self-pay | Admitting: Family Medicine

## 2022-02-27 VITALS — BP 132/88 | HR 70 | Temp 97.7°F | Ht 72.0 in | Wt 214.2 lb

## 2022-02-27 DIAGNOSIS — I4891 Unspecified atrial fibrillation: Secondary | ICD-10-CM | POA: Diagnosis not present

## 2022-02-27 DIAGNOSIS — U071 COVID-19: Secondary | ICD-10-CM | POA: Diagnosis not present

## 2022-02-27 DIAGNOSIS — J029 Acute pharyngitis, unspecified: Secondary | ICD-10-CM

## 2022-02-27 DIAGNOSIS — R0981 Nasal congestion: Secondary | ICD-10-CM | POA: Diagnosis not present

## 2022-02-27 LAB — POC COVID19 BINAXNOW: SARS Coronavirus 2 Ag: POSITIVE — AB

## 2022-02-27 MED ORDER — FLUTICASONE PROPIONATE 50 MCG/ACT NA SUSP: 2.0000 | Freq: Every day | NASAL | 6 refills | Status: DC

## 2022-02-27 MED ORDER — MOLNUPIRAVIR EUA 200MG CAPSULE: 4.0000 | ORAL_CAPSULE | Freq: Two times a day (BID) | ORAL | 0 refills | Status: AC

## 2022-02-27 MED ORDER — NIRMATRELVIR/RITONAVIR (PAXLOVID)TABLET: 3.0000 | ORAL_TABLET | Freq: Two times a day (BID) | ORAL | 0 refills | Status: DC

## 2022-02-27 NOTE — Telephone Encounter (Signed)
Please let patient know that I discontinued the Paxlovid and ordered Molnupiravir.  I spoke with my pharmacist and this medication does not interfere with Xarelto.

## 2022-02-27 NOTE — Assessment & Plan Note (Signed)
Recent cardioversion 02/19/22.  Now in sinus rhythm.  Current medications Xarelto 20 mg and sotalol 120 mg daily.  Continue to follow with cardiology.

## 2022-02-27 NOTE — Progress Notes (Signed)
    SUBJECTIVE:   CHIEF COMPLAINT / HPI: Sore throat  Patient went to golf course in [place] this last week.  On flight home was sitting next to someone with a cough.  Reports cough started that night, 5 days ago.  Following morning cough was worse and voice was hoarse seem to be progressively worse throughout the day.  Productive cough on Sunday, gel consistency yellow in color.  Endorses nasal congestion.  Denies any fevers, chest pain, shortness of breath, dyspnea, nausea/vomiting.  No loss of appetite, taste or smell.  Little water intake.  A-fib Cardioverted 8 days ago.  Now in sinus rhythm.  Denies any heart palpitations, chest pain since cardioversion.  Had an episode of dizziness when getting up quickly from sitting to standing position.  Has since resolved.  Reports compliancy with Xarelto 20 mg and sotalol 120 mg daily.  Endorses not taking Cardizem.  PERTINENT  PMH / PSH:  A-fib status post cardioversion x 3 Seronegative arthritis on methotrexate  OBJECTIVE:   BP 132/88 (BP Location: Left Arm, Patient Position: Sitting, Cuff Size: Normal)   Pulse 70   Temp 97.7 F (36.5 C) (Oral)   Ht 6' (1.829 m)   Wt 214 lb 3.2 oz (97.2 kg)   SpO2 99%   BMI 29.05 kg/m    General: Alert, no acute distress HEENT: TMs visible bilaterally, posterior pharyngeal wall slightly erythematous, no cervical lymphadenopathy Cardio: Normal S1 and S2, RRR, no r/m/g Pulm: CTAB, normal work of breathing Abdomen: Bowel sounds normal. Abdomen soft and non-tender.  Extremities: No peripheral edema.   ASSESSMENT/PLAN:   COVID COVID-positive. Paxlovid contraindicated secondary to Xarelto Start Molnupiravir 800 mg twice daily x5 days COVID precautions provided Recommend home O2 sat monitoring If increasing shortness of breath, chest pain or oxygen saturation sustained at less than 92% patient instructed to be evaluated in ED.   Atrial fibrillation status post cardioversion [facility]) Recent  cardioversion 02/19/22.  Now in sinus rhythm.  Current medications Xarelto 20 mg and sotalol 120 mg daily.  Continue to follow with cardiology.  Sinus congestion Likely secondary to change in environment conditions.  History of seasonal allergies. Flonase 1 spray to each nostril daily   PDMP reviewed  Carollee Leitz, MD [place]

## 2022-02-27 NOTE — Assessment & Plan Note (Addendum)
Likely secondary to change in environment conditions.  History of seasonal allergies. Flonase 1 spray to each nostril daily

## 2022-02-27 NOTE — Telephone Encounter (Signed)
Pt called stating he saw Volanda Napoleon today and she stated if he got worse to call. Pt stated he is breaking out in hives under his armpit and its red. He is also dizzy and tired.

## 2022-02-27 NOTE — Assessment & Plan Note (Signed)
COVID-positive. Paxlovid contraindicated secondary to Xarelto Start Molnupiravir 800 mg twice daily x5 days COVID precautions provided Recommend home O2 sat monitoring If increasing shortness of breath, chest pain or oxygen saturation sustained at less than 92% patient instructed to be evaluated in ED.

## 2022-02-27 NOTE — Telephone Encounter (Signed)
Patient states he is following up on previous message from earlier today regarding his medication.  I read Dr. Hinda Lenis message to patient.  Patient states he will check with his pharmacy.

## 2022-02-27 NOTE — Patient Instructions (Addendum)
It was a pleasure meeting you today. Thank you for allowing me to take part in your health care.  Our goals for today as we discussed include:  For your sore throat Use Cepacol lozenges or throat spray as directed Can take Tylenol 500 mg every 4-6 hours for discomfort Increase water intake Warm soup, teas and soft non irritating food.   Avoid rough texture or spicy foods Can use humidifier or spend few minutes in bathroom with warm shower running few times a day to help avoid dryness Avoid tobacco use or second hand smoke exposure  For your sinus congestion Use Flonase 2 sprays to each nostril once daily.  Your COVID test positive Start Paxlovid today. If you develop any shortness of breath, chest pain, difficulty breathing go to the emergency department soon as possible.   Please follow-up with PCP as needed or if symptoms worsen  If you have any questions or concerns, please do not hesitate to call the office at [phone].  I look forward to our next visit and until then take care and stay safe.  Regards,   Carollee Leitz, MD   [facility]

## 2022-02-27 NOTE — Telephone Encounter (Signed)
Pt called stating he was prescribed some medication from [facility] and the pharmacy stated mixing the medication with his Xarelto may cause him to bleed. Pt would like a call back

## 2022-02-28 ENCOUNTER — Telehealth: Payer: Self-pay

## 2022-02-28 NOTE — Telephone Encounter (Signed)
Dr. Volanda Napoleon  to the Patient then he called back with more symptoms and I spoke to him. He stated rash in his armpits, very fatigued, light headed and nauseated. Dr. Volanda Napoleon recommended starting the medicine and if the symptoms progressed or if he felt so uncomfortable that he could not take it to go to the ED. Dr. Volanda Napoleon did recommend going to the ED if symptoms keep progressing. [name] stated understanding and agreed that he would go if it progressed or he started feeling worse.

## 2022-02-28 NOTE — Telephone Encounter (Signed)
Called Patient to see how he is today. Patient states his oxygen saturation is 94%-95%. His heart rate was going a little crazy but since he is on the medication it is better. Patient is home taking his medication and resting.

## 2022-03-02 NOTE — Telephone Encounter (Signed)
Noted and agree. 

## 2022-03-04 NOTE — Telephone Encounter (Signed)
Pt called in stating that he still not feeling well... Pt requested a appt but no avail appt until next week... Advised pt about other providers availability and pt doesn't want to take avail appt... Pt requesting callback.Marland KitchenMarland Kitchen

## 2022-03-05 NOTE — Telephone Encounter (Signed)
Called patient to discuss concerns about worsening symptoms.  Reports that symptoms now improving today.  Completed 5 day course of Antiviral treatment.  Advised continued symptomatic treatment.  Strict return precautions provided.  Carollee Leitz, MD

## 2022-03-17 NOTE — Progress Notes (Deleted)
Cardiology Office Note  Date:  03/17/2022   ID:  Jake Liu, DOB 11-03-1960, MRN [id number]  PCP:  Burnard Hawthorne, FNP   No chief complaint on file.   HPI:  Jake Liu is a 61 year old gentleman with past medical history of Atrial fibrillation, cardioversion June 2013, repeat July 2014, Normal ejection fraction on transesophageal echo July 2014 History of ablation, 2017 Smokes cigars Hypertension TIA Stress at work/anxiety, drinks wine Obstructive sleep apnea Coronary calcium score of 1228. In 2020 seronegative RA followed by rheumatology. Alcohol Who presents for follow-up of his hypertension, atrial fibrillation  Last seen in clinic by myself February 2023 ED admission 06/01/2021 for recurrent AF. Underwent TEE cardioversion July 2023 at [facility] Sotalol dose was decreased down to 80 twice daily secondary to fatigue Plan for repeat ablation in the spring per the patient  Reported 3 to 4 glasses of red wine every night for dinner, more with dinner trips taking sotalol 120 mg daily in the p.m., none in the a.m.  Heavy drinking night last night, after dinner box of hard alcohol and glasses of wine Today in atrial fibrillation today rate 120 Sweating this Am, still feels mildly diaphoretic otherwise reports feeling okay Had 3 cups coffee this morning, no sotalol this morning, does not check his heart rate  Last week with PMD, rate 87  Quit statin on his own Was represcribed by primary care last week, still not started it wonders whether he needs to Total cholesterol 250  Reports he is followed by Dr. [name], EP at [facility] " Dr. [name] told me to take sotalol twice a day"  EKG personally reviewed by myself on todays visit Atrial fibrillation rate 120 bpm   Past medical history reviewed seen in the office by Laurann Montana 09/07/2020 : increasing fatigue.  EKG showed atrial fibrillation with a rate of 98.  self-decreased his sotalol 120 mg from the prescribed  twice a day to once a day about 5 months ago because he felt very tired on twice a day dosing.  advised him to increase to twice daily dosing.  starting taking sotalol 1/2 tablet ('60mg'$ ) sotalol in the morning and '120mg'$  in the evening.  wants to avoid higher-risk AADs but doesn't feel well on sotalol twice daily. He prefers to avoid repeat ablation if possible, but wishes to proceed with DCCV.  Procedure 09/17/20, cardioversion No ablation recently   hospitalization May 2021 Had hernia repair Before getting into the hospital had gone into atrial fibrillation presumably secondary to the pain Placed on Cardizem infusion, metoprolol increased  In hospital [facility] 4 days: cardioversion metoprolol to sotalol On xarelto  Seen by [facility] cardiology as outpatient last month, treadmill stress echo ordered, has not been completed yet  Does not know what medications he is taking, unclear if he is on Zetia or Crestor  Past medical history reviewed Echo 09/2017, outside facility Normal LV function, no significant valvular disease Dilated aortic root, 3.9 cm  Carotid Doppler, normal 2014  2013,  Myoview showing no ischemia   PMH:   has a past medical history of Anginal pain ([place]), Arthritis, Atrial fibrillation ([place]), Cardiomyopathy ([place]), CHF (congestive heart failure) ([place]), Fatty liver, GERD (gastroesophageal reflux disease), Headache, History of TIAs, Hypertension, Insomnia, Sciatica, Seronegative arthritis (09/27/2019), and Sleep apnea.  PSH:    Past Surgical History:  Procedure Laterality Date   ABLATION N/A    for af   COLONOSCOPY     COLONOSCOPY WITH PROPOFOL N/A 10/13/2018   Procedure: COLONOSCOPY  WITH PROPOFOL;  Surgeon: Toledo, [name], MD;  Location: [facility] END[facility];  Service: Gastroenterology;  Laterality: N/A;   ESOPHAGOGASTRODUODENOSCOPY     ESOPHAGOGASTRODUODENOSCOPY (EGD) WITH PROPOFOL N/A 10/13/2018   Procedure: ESOPHAGOGASTRODUODENOSCOPY (EGD) WITH PROPOFOL;  Surgeon: Toledo,  [name], MD;  Location: [facility] END[facility];  Service: Gastroenterology;  Laterality: N/A;   HERNIA REPAIR     KNEE SURGERY Left    UMBILICAL HERNIA REPAIR N/A 12/05/2019   Procedure: LAPAROSCOPIC UMBILICAL HERNIA REPAIR;  Surgeon: Alphonsa Overall, MD;  Location: [facility];  Service: General;  Laterality: N/A;    Current Outpatient Medications  Medication Sig Dispense Refill   diltiazem (CARDIZEM) 60 MG tablet TAKE 1 TABLET BY MOUTH THREE TIMES DAILY AS NEEDED FOR HEART RATE (A-FIB) > 110 270 tablet 1   fluticasone (FLONASE) 50 MCG/ACT nasal spray Place 2 sprays into both nostrils daily. 16 g 6   folic acid (FOLVITE) 1 MG tablet Take 1 tablet by mouth daily.     methotrexate (RHEUMATREX) 2.5 MG tablet Take 6 tablets by mouth once a week.     rivaroxaban (XARELTO) 20 MG TABS tablet Take 20 mg by mouth at bedtime.      sildenafil (VIAGRA) 50 MG tablet Take 0.5 tablets (25 mg total) by mouth daily as needed for erectile dysfunction (max '100mg'$ /dose up to 1 dose/day. Give 0.5-4h before sexual activity). 10 tablet 3   No current facility-administered medications for this visit.    Allergies:   Sulfa antibiotics, Sulfasalazine, Bactrim [sulfamethoxazole-trimethoprim], Lisinopril, and Zetia [ezetimibe]   Social History:  The patient  reports that he has quit smoking. He has never used smokeless tobacco. He reports that he does not currently use alcohol after a past usage of about 14.0 standard drinks of alcohol per week. He reports that he does not use drugs.   Family History:   family history includes Asthma in his father; Heart disease (age of onset: 40) in his mother.    Review of Systems: Review of Systems  Constitutional:  Positive for diaphoresis.  Respiratory: Negative.    Cardiovascular: Negative.   Gastrointestinal: Negative.   Musculoskeletal: Negative.   Neurological: Negative.   Psychiatric/Behavioral: Negative.    All other systems reviewed and are negative.    PHYSICAL EXAM: VS:   There were no vitals taken for this visit. , BMI There is no height or weight on file to calculate BMI. Constitutional:  oriented to person, place, and time. No distress.  Feels clammy HENT:  Head: Grossly normal Eyes:  no discharge. No scleral icterus.  Neck: No JVD, no carotid bruits  Cardiovascular: Irregularly irregular, rapid, no murmurs appreciated Pulmonary/Chest: Clear to auscultation bilaterally, no wheezes or rails Abdominal: Soft.  no distension.  no tenderness.  Musculoskeletal: Normal range of motion Neurological:  normal muscle tone. Coordination normal. No atrophy Skin: Skin warm and dry Psychiatric: normal affect, pleasant   Recent Labs: 06/03/2021: ALT 23; BUN 19; Creatinine, Ser 0.89; Hemoglobin 15.5; Platelets 281.0; Potassium 4.7; Sodium 138; TSH 3.12   Lipid Panel Lab Results  Component Value Date   CHOL 255 (H) 11/28/2019   HDL 70 11/28/2019   LDLCALC 169 (H) 11/28/2019   TRIG 81 11/28/2019      Wt Readings from Last 3 Encounters:  02/27/22 214 lb 3.2 oz (97.2 kg)  09/17/21 228 lb 12.8 oz (103.8 kg)  09/13/21 225 lb 6 oz (102.2 kg)      ASSESSMENT AND PLAN:  Atrial fibrillation status post cardioversion ([place]) Only taking sotalol in  the p.m., was prescribed twice a day by EP In atrial fibrillation today Heavy drinking last night Recommend he take sotalol this morning and diltiazem 60 mg at noon and if rate remains above 110 Stressed the importance of taking sotalol twice a day, Xarelto daily Long discussion concerning risk factors for atrial fibrillation which include his drinking and weight Recommend if he does not convert to normal sinus rhythm early next week that he call our office or Dr. [name]  Coronary disease with stable angina Heavy coronary calcification, calcium scoring performed February 2020 Denies anginal symptoms Previously ordered stress test, not completed Recommend he get back on his Crestor Continues to take himself off the  medication  Essential hypertension Increase sotalol up to twice a day  Obstructive sleep apnea Weight high, lots of drinking Risks discussed with him  GAD (generalized anxiety disorder) Recommend cut back on his alcohol  Mixed hyperlipidemia Stay on Crestor   Total encounter time more than 40 minutes  Greater than 50% was spent in counseling and coordination of care with the patient   No orders of the defined types were placed in this encounter.    Signed, Esmond Plants, M.D., Ph.D. 03/17/2022  [place], [place]

## 2022-03-18 ENCOUNTER — Ambulatory Visit: Payer: Commercial Managed Care - PPO | Admitting: Cardiovascular Disease

## 2022-03-18 DIAGNOSIS — G4733 Obstructive sleep apnea (adult) (pediatric): Secondary | ICD-10-CM

## 2022-03-18 DIAGNOSIS — I10 Essential (primary) hypertension: Secondary | ICD-10-CM

## 2022-03-18 DIAGNOSIS — I4819 Other persistent atrial fibrillation: Secondary | ICD-10-CM

## 2022-03-18 DIAGNOSIS — I2510 Atherosclerotic heart disease of native coronary artery without angina pectoris: Secondary | ICD-10-CM

## 2022-03-18 DIAGNOSIS — E785 Hyperlipidemia, unspecified: Secondary | ICD-10-CM

## 2022-03-18 DIAGNOSIS — I5022 Chronic systolic (congestive) heart failure: Secondary | ICD-10-CM

## 2022-03-18 DIAGNOSIS — I700 Atherosclerosis of aorta: Secondary | ICD-10-CM

## 2022-03-19 ENCOUNTER — Encounter: Payer: Self-pay | Admitting: Cardiovascular Disease

## 2022-03-27 ENCOUNTER — Ambulatory Visit: Payer: Commercial Managed Care - PPO | Admitting: Dermatology

## 2022-03-27 DIAGNOSIS — Z1283 Encounter for screening for malignant neoplasm of skin: Secondary | ICD-10-CM | POA: Diagnosis not present

## 2022-03-27 DIAGNOSIS — L820 Inflamed seborrheic keratosis: Secondary | ICD-10-CM | POA: Diagnosis not present

## 2022-03-27 DIAGNOSIS — L719 Rosacea, unspecified: Secondary | ICD-10-CM

## 2022-03-27 DIAGNOSIS — D229 Melanocytic nevi, unspecified: Secondary | ICD-10-CM

## 2022-03-27 DIAGNOSIS — L814 Other melanin hyperpigmentation: Secondary | ICD-10-CM | POA: Diagnosis not present

## 2022-03-27 DIAGNOSIS — D1800 Hemangioma unspecified site: Secondary | ICD-10-CM

## 2022-03-27 DIAGNOSIS — L578 Other skin changes due to chronic exposure to nonionizing radiation: Secondary | ICD-10-CM | POA: Diagnosis not present

## 2022-03-27 DIAGNOSIS — L821 Other seborrheic keratosis: Secondary | ICD-10-CM

## 2022-03-27 NOTE — Patient Instructions (Addendum)
Instructions for [facility] Medications Rosacea One or more of your medications was sent to the [facility] mail order compounding pharmacy. You will receive an email from them and can purchase the medicine through that link. It will then be mailed to your home at the address you confirmed. If for any reason you do not receive an email from them, please check your spam folder. If you still do not find the email, please let us know. [facility] phone number is [phone].      Cryotherapy Aftercare  Wash gently with soap and water everyday.   Apply Vaseline and Band-Aid daily until healed.    Due to recent changes in healthcare laws, you may see results of your pathology and/or laboratory studies on MyChart before the doctors have had a chance to review them. We understand that in some cases there may be results that are confusing or concerning to you. Please understand that not all results are received at the same time and often the doctors may need to interpret multiple results in order to provide you with the best plan of care or course of treatment. Therefore, we ask that you please give [place] 2 business days to thoroughly review all your results before contacting the office for clarification. Should we see a critical lab result, you will be contacted sooner.   If You Need Anything After Your Visit  If you have any questions or concerns for your doctor, please call our main line at [phone] and press option 4 to reach your doctor's medical assistant. If no one answers, please leave a voicemail as directed and we will return your call as soon as possible. Messages left after 4 pm will be answered the following business day.   You may also send [place] a message via [place]. We typically respond to MyChart messages within 1-2 business days.  For prescription refills, please ask your pharmacy to contact our office. Our fax number is [phone].  If you have an urgent issue when  the clinic is closed that cannot wait until the next business day, you can page your doctor at the number below.    Please note that while we do our best to be available for urgent issues outside of office hours, we are not available 24/7.   If you have an urgent issue and are unable to reach [place], you may choose to seek medical care at your doctor's office, retail clinic, urgent care center, or emergency room.  If you have a medical emergency, please immediately call 911 or go to the emergency department.  Pager Numbers  - Dr. Nehemiah Massed: [phone]  - Dr. Laurence Ferrari: [phone]  - Dr. Nicole Kindred: [phone]  In the event of inclement weather, please call our main line at [phone] for an update on the status of any delays or closures.  Dermatology Medication Tips: Please keep the boxes that topical medications come in in order to help keep track of the instructions about where and how to use these. Pharmacies typically print the medication instructions only on the boxes and not directly on the medication tubes.   If your medication is too expensive, please contact our office at [phone] option 4 or send [place] a message through [place].   We are unable to tell what your co-pay for medications will be in advance as this is different depending on your insurance coverage. However, we may be able to find a substitute medication at lower cost or fill out paperwork to get insurance  needed medication.   If a prior authorization is required to get your medication covered by your insurance company, please allow us 1-2 business days to complete this process.  Drug prices often vary depending on where the prescription is filled and some pharmacies may offer cheaper prices.  The website www.goodrx.com contains coupons for medications through different pharmacies. The prices here do not account for what the cost may be with help from insurance (it may be cheaper with your insurance), but the website can  give you the price if you did not use any insurance.  - You can print the associated coupon and take it with your prescription to the pharmacy.  - You may also stop by our office during regular business hours and pick up a GoodRx coupon card.  - If you need your prescription sent electronically to a different pharmacy, notify our office through [place] MyChart or by phone at [phone] option 4.     Si Usted Necesita Algo Despus de Su Visita  Tambin puede enviarnos un mensaje a travs de MyChart. Por lo general respondemos a los mensajes de MyChart en el transcurso de 1 a 2 das hbiles.  Para renovar recetas, por favor pida a su farmacia que se ponga en contacto con nuestra oficina. Nuestro nmero de fax es el [phone].  Si tiene un asunto urgente cuando la clnica est cerrada y que no puede esperar hasta el siguiente da hbil, puede llamar/localizar a su doctor(a) al nmero que aparece a continuacin.   Por favor, tenga en cuenta que aunque hacemos todo lo posible para estar disponibles para asuntos urgentes fuera del horario de oficina, no estamos disponibles las 24 horas del da, los 7 das de la semana.   Si tiene un problema urgente y no puede comunicarse con nosotros, puede optar por buscar atencin mdica  en el consultorio de su doctor(a), en una clnica privada, en un centro de atencin urgente o en una sala de emergencias.  Si tiene una emergencia mdica, por favor llame inmediatamente al 911 o vaya a la sala de emergencias.  Nmeros de bper  - Dr. Kowalski: [phone]  - Dra. Moye: [phone]  - Dra. Stewart: [phone]  En caso de inclemencias del tiempo, por favor llame a nuestra lnea principal al [phone] para una actualizacin sobre el estado de cualquier retraso o cierre.  Consejos para la medicacin en dermatologa: Por favor, guarde las cajas en las que vienen los medicamentos de uso tpico para ayudarle a seguir las instrucciones sobre  dnde y cmo usarlos. Las farmacias generalmente imprimen las instrucciones del medicamento slo en las cajas y no directamente en los tubos del medicamento.   Si su medicamento es muy caro, por favor, pngase en contacto con nuestra oficina llamando al [phone] y presione la opcin 4 o envenos un mensaje a travs de MyChart.   No podemos decirle cul ser su copago por los medicamentos por adelantado ya que esto es diferente dependiendo de la cobertura de su seguro. Sin embargo, es posible que podamos encontrar un medicamento sustituto a menor costo o llenar un formulario para que el seguro cubra el medicamento que se considera necesario.   Si se requiere una autorizacin previa para que su compaa de seguros cubra su medicamento, por favor permtanos de 1 a 2 das hbiles para completar este proceso.  Los precios de los medicamentos varan con frecuencia dependiendo del lugar de dnde se surte la receta y alguna farmacias pueden ofrecer precios ms baratos.    ms baratos.  El sitio web www.goodrx.com tiene cupones para medicamentos de Airline pilot. Los precios aqu no tienen en cuenta lo que podra costar con la ayuda del seguro (puede ser ms barato con su seguro), pero el sitio web puede darle el precio si no utiliz Research scientist (physical sciences).  - Puede imprimir el cupn correspondiente y llevarlo con su receta a la farmacia.  - Tambin puede pasar por nuestra oficina durante el horario de atencin regular y Charity fundraiser una tarjeta de cupones de GoodRx.  - Si necesita que su receta se enve electrnicamente a una farmacia diferente, informe a nuestra oficina a travs de MyChart de [place] o por telfono llamando al [phone] y presione la opcin 4.

## 2022-03-27 NOTE — Progress Notes (Signed)
Follow-Up Visit   Subjective  Jake Liu is a 61 y.o. male who presents for the following: Annual Exam. Pt c/o spots on his face and hand growing and changing.  The patient presents for Upper Body Skin Exam (UBSE) for skin cancer screening and mole check.  The patient has spots, moles and lesions to be evaluated, some may be new or changing and the patient has concerns that these could be cancer.   The following portions of the chart were reviewed this encounter and updated as appropriate:   Tobacco  Allergies  Meds  Problems  Med Hx  Surg Hx  Fam Hx     Review of Systems:  No other skin or systemic complaints except as noted in HPI or Assessment and Plan.  Objective  Well appearing patient in no apparent distress; mood and affect are within normal limits.  All skin waist up examined.  left cheek x 2, left hand x 1  (3) (3) Stuck-on, waxy, tan-brown papules  -- Discussed benign etiology and prognosis.   Head - Anterior (Face) Mid face erythema with telangiectasias +/- scattered inflammatory papules.    Assessment & Plan  Inflamed seborrheic keratosis (3) left cheek x 2, left hand x 1  (3)  Symptomatic, irritating, patient would like treated.   Destruction of lesion - left cheek x 2, left hand x 1  (3) Complexity: simple   Destruction method: cryotherapy   Informed consent: discussed and consent obtained   Timeout:  patient name, date of birth, surgical site, and procedure verified Lesion destroyed using liquid nitrogen: Yes   Region frozen until ice ball extended beyond lesion: Yes   Outcome: patient tolerated procedure well with no complications   Post-procedure details: wound care instructions given    Rosacea Head - Anterior (Face)  Rosacea is a chronic progressive skin condition usually affecting the face of adults, causing redness and/or acne bumps. It is treatable but not curable. It sometimes affects the eyes (ocular rosacea) as well. It may respond to  topical and/or systemic medication and can flare with stress, sun exposure, alcohol, exercise and some foods.  Daily application of broad spectrum spf 30+ sunscreen to face is recommended to reduce flares.   Discussed the treatment option of BBL/laser.  Typically we recommend 1-3 treatment sessions about 5-8 weeks apart for best results.  The patient's condition may require "maintenance treatments" in the future.  The fee for BBL / laser treatments is $350 per treatment session for the whole face.  A fee can be quoted for other parts of the body. Insurance typically does not pay for BBL/laser treatments and therefore the fee is an out-of-pocket cost.   May consider BBL in winter when patient no longer has a tan.  Start Oxymetazoline: 1% Ivermectin: 1% Niacinamide: 2% Vehicle: Cream Apply to face once a day    Lentigines - Scattered tan macules - Due to sun exposure - Benign-appearing, observe - Recommend daily broad spectrum sunscreen SPF 30+ to sun-exposed areas, reapply every 2 hours as needed. - Call for any changes  Seborrheic Keratoses - Stuck-on, waxy, tan-brown papules and/or plaques  - Benign-appearing - Discussed benign etiology and prognosis. - Observe - Call for any changes  Melanocytic Nevi - Tan-brown and/or pink-flesh-colored symmetric macules and papules - Benign appearing on exam today - Observation - Call clinic for new or changing moles - Recommend daily use of broad spectrum spf 30+ sunscreen to sun-exposed areas.   Hemangiomas - Red papules -  Discussed benign nature - Observe - Call for any changes  Actinic Damage - Chronic condition, secondary to cumulative UV/sun exposure - diffuse scaly erythematous macules with underlying dyspigmentation - Recommend daily broad spectrum sunscreen SPF 30+ to sun-exposed areas, reapply every 2 hours as needed.  - Staying in the shade or wearing long sleeves, sun glasses (UVA+UVB protection) and wide brim hats (4-inch  brim around the entire circumference of the hat) are also recommended for sun protection.  - Call for new or changing lesions.  Skin cancer screening performed today.   Return in about 5 months (around 08/27/2022) for Rosacea .  IMarye Round, CMA, am acting as scribe for Sarina Ser, MD .  Documentation: I have reviewed the above documentation for accuracy and completeness, and I agree with the above.  Sarina Ser, MD

## 2022-04-04 ENCOUNTER — Encounter: Payer: Self-pay | Admitting: Dermatology

## 2022-04-16 ENCOUNTER — Ambulatory Visit: Payer: Commercial Managed Care - PPO | Admitting: Family

## 2022-04-16 ENCOUNTER — Encounter: Payer: Self-pay | Admitting: Family

## 2022-04-16 VITALS — BP 136/80 | HR 87 | Temp 97.9°F | Ht 72.0 in | Wt 218.0 lb

## 2022-04-16 DIAGNOSIS — R37 Sexual dysfunction, unspecified: Secondary | ICD-10-CM

## 2022-04-16 DIAGNOSIS — G479 Sleep disorder, unspecified: Secondary | ICD-10-CM

## 2022-04-16 DIAGNOSIS — R0981 Nasal congestion: Secondary | ICD-10-CM

## 2022-04-16 DIAGNOSIS — N529 Male erectile dysfunction, unspecified: Secondary | ICD-10-CM | POA: Diagnosis not present

## 2022-04-16 DIAGNOSIS — Z125 Encounter for screening for malignant neoplasm of prostate: Secondary | ICD-10-CM | POA: Diagnosis not present

## 2022-04-16 DIAGNOSIS — I700 Atherosclerosis of aorta: Secondary | ICD-10-CM

## 2022-04-16 DIAGNOSIS — G4700 Insomnia, unspecified: Secondary | ICD-10-CM

## 2022-04-16 DIAGNOSIS — I4891 Unspecified atrial fibrillation: Secondary | ICD-10-CM | POA: Diagnosis not present

## 2022-04-16 LAB — LIPID PANEL
Cholesterol: 153 mg/dL (ref 0–200)
HDL: 65.5 mg/dL (ref >39.00)
LDL Cholesterol: 81 mg/dL (ref 0–99)
NonHDL: 87.98
Total CHOL/HDL Ratio: 2
Triglycerides: 36 mg/dL (ref 0.0–149.0)
VLDL: 7.2 mg/dL (ref 0.0–40.0)

## 2022-04-16 LAB — VITAMIN D 25 HYDROXY (VIT D DEFICIENCY, FRACTURES): VITD: 20.91 ng/mL — ABNORMAL LOW (ref 30.00–100.00)

## 2022-04-16 LAB — COMPREHENSIVE METABOLIC PANEL
ALT: 21 U/L (ref 0–53)
AST: 21 U/L (ref 0–37)
Albumin: 4 g/dL (ref 3.5–5.2)
Alkaline Phosphatase: 47 U/L (ref 39–117)
BUN: 17 mg/dL (ref 6–23)
CO2: 26 mEq/L (ref 19–32)
Calcium: 9.3 mg/dL (ref 8.4–10.5)
Chloride: 103 mEq/L (ref 96–112)
Creatinine, Ser: 0.67 mg/dL (ref 0.40–1.50)
GFR: 100.4 mL/min (ref >60.00)
Glucose, Bld: 107 mg/dL — ABNORMAL HIGH (ref 70–99)
Potassium: 4.7 mEq/L (ref 3.5–5.1)
Sodium: 136 mEq/L (ref 135–145)
Total Bilirubin: 0.7 mg/dL (ref 0.2–1.2)
Total Protein: 6.9 g/dL (ref 6.0–8.3)

## 2022-04-16 LAB — CBC WITH DIFFERENTIAL/PLATELET
Basophils Absolute: 0.1 10*3/uL (ref 0.0–0.1)
Basophils Relative: 1 % (ref 0.0–3.0)
Eosinophils Absolute: 0.2 10*3/uL (ref 0.0–0.7)
Eosinophils Relative: 3.1 % (ref 0.0–5.0)
HCT: 43 % (ref 39.0–52.0)
Hemoglobin: 14.5 g/dL (ref 13.0–17.0)
Lymphocytes Relative: 28.3 % (ref 12.0–46.0)
Lymphs Abs: 1.4 10*3/uL (ref 0.7–4.0)
MCHC: 33.7 g/dL (ref 30.0–36.0)
MCV: 95.7 fl (ref 78.0–100.0)
Monocytes Absolute: 0.5 10*3/uL (ref 0.1–1.0)
Monocytes Relative: 9.9 % (ref 3.0–12.0)
Neutro Abs: 2.9 10*3/uL (ref 1.4–7.7)
Neutrophils Relative %: 57.7 % (ref 43.0–77.0)
Platelets: 217 10*3/uL (ref 150.0–400.0)
RBC: 4.49 Mil/uL (ref 4.22–5.81)
RDW: 14.2 % (ref 11.5–15.5)
WBC: 5 10*3/uL (ref 4.0–10.5)

## 2022-04-16 LAB — PSA: PSA: 0.21 ng/mL (ref 0.10–4.00)

## 2022-04-16 LAB — TSH: TSH: 1.51 u[IU]/mL (ref 0.35–5.50)

## 2022-04-16 LAB — HEMOGLOBIN A1C: Hgb A1c MFr Bld: 5.4 % (ref 4.6–6.5)

## 2022-04-16 MED ORDER — TRAZODONE HCL 50 MG PO TABS: 25.0000 mg | ORAL_TABLET | Freq: Every evening | ORAL | 3 refills | Status: DC | PRN

## 2022-04-16 MED ORDER — SILDENAFIL CITRATE 50 MG PO TABS: 25.0000 mg | ORAL_TABLET | Freq: Every day | ORAL | 3 refills | Status: DC | PRN

## 2022-04-16 MED ORDER — AZELASTINE HCL 0.1 % NA SOLN: 1.0000 | Freq: Two times a day (BID) | NASAL | 4 refills | Status: DC

## 2022-04-16 NOTE — Assessment & Plan Note (Signed)
Chronic. Patient has not been taking Flonase.  Advised him he may trial Flonase and/or azelastine which I prescribed today.  Pending CT sinus to evaluate for  structural sinus disease which would cause snoring.  Pending referral to pulmonology as well for evaluation of sleep apnea

## 2022-04-16 NOTE — Assessment & Plan Note (Signed)
Previously tried Zoloft and did not care for medication.  Trial of trazodone.  Counseled him on alcohol use and to limit alcohol use while on medication for sleep.

## 2022-04-16 NOTE — Assessment & Plan Note (Signed)
Chronic, stable.  He is not on nitrates.  No history of MI.  Continue viagra.

## 2022-04-16 NOTE — Assessment & Plan Note (Addendum)
Chronic, overall stable.  He is compliant with Xarelto 20 mg, sotalol 80 mg twice daily.  He will continue close follow-up with Dr. [name] as well as Dr. [name].  Referral placed to pulmonology for formal evaluation of sleep apnea

## 2022-04-16 NOTE — Patient Instructions (Addendum)
Nasal congestion, I sent in a a nasal spray called azelastine.  For sleep, I have sent in trazodone for you to trial.  You may take this medication as needed however I tend to recommend taking it every single night for better sleep quality.  I placed a referral to pulmonology for further evaluation of sleep apnea.  I also ordered a CT sinus to evaluate for sinus disease  Let us know if you dont hear back within a week in regards to an appointment being scheduled.

## 2022-04-16 NOTE — Progress Notes (Signed)
Subjective:    Patient ID: Jake Liu, male    DOB: 28-May-1961, 61 y.o.   MRN: [id number]  CC: Jake Liu is a 62 y.o. male who presents today for follow up.   HPI: He complains of trouble sleeping. Trouble falling asleep.   He drinks wine every night.    Denies depression. He has tried zoloft in past.  He has chronic nasal congestion. He had never felt that he had seasonal allergies.  He doesn't take antihistamine.  Snores. Chronic fatigue but he thinks related to poor sleep.   He is not exercising regularly.   He enjoys playing golf.   Compliant with xarelto '20mg'$ , sotalol '80mg'$  bid.  Episodic palpitations.  Denies chest pain   Continues to follow rheumatology for rheumatoid arthritis, Dr Posey Pronto.  Last follow-up 11/27/2021.  He is longer on Methotrexate,  folic acid as felt made his immune system weak.  No joint pain.  He is compliant with Humira   Continues to follow with cardiology, Dr. [name] at [facility].  Last visit 02/18/2022 when discussed the need for ablation and deferring to spring of next year.  Change sotalol dose to 80 mg twice daily as 120 mg twice daily caused fatigue   He would like refill for viagra. No h/o MI. He is not on nitrate.   HISTORY:  Past Medical History:  Diagnosis Date   Anginal pain ([place])    Arthritis    Atrial fibrillation (HCC)    Cardiomyopathy (HCC)    EF: 45%   CHF (congestive heart failure) (HCC)    Fatty liver    borderline   GERD (gastroesophageal reflux disease)    Headache    History of TIAs    Hypertension    Insomnia    Sciatica    Seronegative arthritis 09/27/2019   Sleep apnea    No CPAP   Past Surgical History:  Procedure Laterality Date   ABLATION N/A    for af   COLONOSCOPY     COLONOSCOPY WITH PROPOFOL N/A 10/13/2018   Procedure: COLONOSCOPY WITH PROPOFOL;  Surgeon: Toledo, [name], MD;  Location: [facility] ENDOSCOPY;  Service: Gastroenterology;  Laterality: N/A;   ESOPHAGOGASTRODUODENOSCOPY      ESOPHAGOGASTRODUODENOSCOPY (EGD) WITH PROPOFOL N/A 10/13/2018   Procedure: ESOPHAGOGASTRODUODENOSCOPY (EGD) WITH PROPOFOL;  Surgeon: Toledo, [name], MD;  Location: [facility] ENDOSCOPY;  Service: Gastroenterology;  Laterality: N/A;   HERNIA REPAIR     KNEE SURGERY Left    UMBILICAL HERNIA REPAIR N/A 12/05/2019   Procedure: LAPAROSCOPIC UMBILICAL HERNIA REPAIR;  Surgeon: Alphonsa Overall, MD;  Location: [facility] ORS;  Service: General;  Laterality: N/A;   Family History  Problem Relation Age of Onset   Heart disease Mother 86   Asthma Father     Allergies: Sulfa antibiotics, Sulfasalazine, Bactrim [sulfamethoxazole-trimethoprim], Lisinopril, and Zetia [ezetimibe] Current Outpatient Medications on File Prior to Visit  Medication Sig Dispense Refill   rivaroxaban (XARELTO) 20 MG TABS tablet Take 20 mg by mouth at bedtime.      rosuvastatin (CRESTOR) 40 MG tablet Take 40 mg by mouth daily. Take 1 40 mg tablet once daily     sotalol (BETAPACE) 80 MG tablet Take 80 mg by mouth 2 (two) times daily.     No current facility-administered medications on file prior to visit.    Social History   Tobacco Use   Smoking status: Former   Smokeless tobacco: Never  Scientific laboratory technician Use: Never used  Substance Use Topics  Alcohol use: Not Currently    Alcohol/week: 14.0 standard drinks of alcohol    Types: 7 Glasses of wine, 7 Standard drinks or equivalent per week    Comment: drinks at night., bottle and half of red wine;  Drinks 10 cups coffee daily   Drug use: No    Review of Systems  Constitutional:  Positive for fatigue. Negative for chills and fever.  HENT:  Positive for congestion.   Respiratory:  Negative for cough.   Cardiovascular:  Negative for chest pain and palpitations.  Gastrointestinal:  Negative for nausea and vomiting.      Objective:    BP 136/80 (BP Location: Left Arm, Patient Position: Sitting, Cuff Size: Normal)   Pulse 87   Temp 97.9 F (36.6 C) (Oral)   Ht 6' (1.829 m)    Wt 218 lb (98.9 kg)   SpO2 98%   BMI 29.57 kg/m  BP Readings from Last 3 Encounters:  04/16/22 136/80  02/27/22 132/88  09/17/21 128/66   Wt Readings from Last 3 Encounters:  04/16/22 218 lb (98.9 kg)  02/27/22 214 lb 3.2 oz (97.2 kg)  09/17/21 228 lb 12.8 oz (103.8 kg)    Physical Exam Vitals reviewed.  Constitutional:      Appearance: He is well-developed.  Cardiovascular:     Rate and Rhythm: Regular rhythm.     Heart sounds: Normal heart sounds.  Pulmonary:     Effort: Pulmonary effort is normal. No respiratory distress.     Breath sounds: Normal breath sounds. No wheezing, rhonchi or rales.  Skin:    General: Skin is warm and dry.  Neurological:     Mental Status: He is alert.  Psychiatric:        Speech: Speech normal.        Behavior: Behavior normal.        Assessment & Plan:   Problem List Items Addressed This Visit       Cardiovascular and Mediastinum   Aortic atherosclerosis ([place])   Relevant Medications   rosuvastatin (CRESTOR) 40 MG tablet   sotalol (BETAPACE) 80 MG tablet   sildenafil (VIAGRA) 50 MG tablet   Other Relevant Orders   Lipid panel   Atrial fibrillation status post cardioversion (HCC)    Chronic, overall stable.  He is compliant with Xarelto 20 mg, sotalol 80 mg twice daily.  He will continue close follow-up with Dr. [name] as well as Dr. [name].  Referral placed to pulmonology for formal evaluation of sleep apnea      Relevant Medications   rosuvastatin (CRESTOR) 40 MG tablet   sotalol (BETAPACE) 80 MG tablet   sildenafil (VIAGRA) 50 MG tablet   Other Relevant Orders   Ambulatory referral to Pulmonology   Comprehensive metabolic panel   CBC with Differential/Platelet   TSH   VITAMIN D 25 Hydroxy (Vit-D Deficiency, Fractures)   Hemoglobin A1c     Respiratory   Sinus congestion - Primary    Chronic. Patient has not been taking Flonase.  Advised him he may trial Flonase and/or azelastine which I prescribed today.  Pending CT  sinus to evaluate for  structural sinus disease which would cause snoring.  Pending referral to pulmonology as well for evaluation of sleep apnea      Relevant Medications   azelastine (ASTELIN) 0.1 % nasal spray   Other Relevant Orders   CT Maxillofacial W/Cm   Ambulatory referral to Pulmonology     Other   Erectile dysfunction   Relevant  Medications   sildenafil (VIAGRA) 50 MG tablet   Insomnia    Previously tried Zoloft and did not care for medication.  Trial of trazodone.  Counseled him on alcohol use and to limit alcohol use while on medication for sleep.      Sexual dysfunction    Chronic, stable.  He is not on nitrates.  No history of MI.  Continue viagra.        Other Visit Diagnoses     Screening for prostate cancer       Relevant Orders   PSA   Trouble in sleeping       Relevant Medications   traZODone (DESYREL) 50 MG tablet        I have discontinued Casimer Bilis Tegtmeyer's folic acid, methotrexate, diltiazem, and fluticasone. I am also having him start on azelastine and traZODone. Additionally, I am having him maintain his rivaroxaban, rosuvastatin, sotalol, and sildenafil.   Meds ordered this encounter  Medications   sildenafil (VIAGRA) 50 MG tablet    Sig: Take 0.5 tablets (25 mg total) by mouth daily as needed for erectile dysfunction (max '100mg'$ /dose up to 1 dose/day. Give 0.5-4h before sexual activity).    Dispense:  10 tablet    Refill:  3    Order Specific Question:   Supervising Provider    Answer:   Deborra Medina L [[id number]]   azelastine (ASTELIN) 0.1 % nasal spray    Sig: Place 1 spray into both nostrils 2 (two) times daily. Use in each nostril as directed    Dispense:  30 mL    Refill:  4    Order Specific Question:   Supervising Provider    Answer:   Deborra Medina L [[id number]]   traZODone (DESYREL) 50 MG tablet    Sig: Take 0.5-1 tablets (25-50 mg total) by mouth at bedtime as needed for sleep.    Dispense:  90 tablet    Refill:  3    Order Specific  Question:   Supervising Provider    Answer:   Crecencio Mc [[id number]]    Return precautions given.   Risks, benefits, and alternatives of the medications and treatment plan prescribed today were discussed, and patient expressed understanding.   Education regarding symptom management and diagnosis given to patient on AVS.  Continue to follow with Burnard Hawthorne, FNP for routine health maintenance.   [name] and I agreed with plan.   [name], FNP

## 2022-05-06 ENCOUNTER — Ambulatory Visit: Admission: RE | Admit: 2022-05-06 | Payer: Commercial Managed Care - PPO | Source: Ambulatory Visit

## 2022-06-30 ENCOUNTER — Encounter: Payer: Commercial Managed Care - PPO | Admitting: Family

## 2022-07-01 ENCOUNTER — Encounter: Payer: Commercial Managed Care - PPO | Admitting: Family

## 2022-07-11 ENCOUNTER — Encounter: Payer: Commercial Managed Care - PPO | Admitting: Family

## 2022-08-04 ENCOUNTER — Encounter: Payer: Self-pay | Admitting: Family

## 2022-08-04 ENCOUNTER — Ambulatory Visit (INDEPENDENT_AMBULATORY_CARE_PROVIDER_SITE_OTHER): Payer: Commercial Managed Care - PPO | Admitting: Family

## 2022-08-04 VITALS — BP 122/64 | HR 107 | Temp 97.6°F | Ht 72.0 in | Wt 220.4 lb

## 2022-08-04 DIAGNOSIS — I10 Essential (primary) hypertension: Secondary | ICD-10-CM | POA: Diagnosis not present

## 2022-08-04 DIAGNOSIS — Z0000 Encounter for general adult medical examination without abnormal findings: Secondary | ICD-10-CM | POA: Diagnosis not present

## 2022-08-04 DIAGNOSIS — E782 Mixed hyperlipidemia: Secondary | ICD-10-CM | POA: Diagnosis not present

## 2022-08-04 DIAGNOSIS — G4733 Obstructive sleep apnea (adult) (pediatric): Secondary | ICD-10-CM

## 2022-08-04 DIAGNOSIS — I4891 Unspecified atrial fibrillation: Secondary | ICD-10-CM

## 2022-08-04 DIAGNOSIS — Z8639 Personal history of other endocrine, nutritional and metabolic disease: Secondary | ICD-10-CM | POA: Diagnosis not present

## 2022-08-04 NOTE — Assessment & Plan Note (Signed)
Dx Moderate sleep apnea 2015.  Again reiterated the importance of updating with sleep study and patient being compliant with CPAP specially in the setting of recurrent atrial fibrillation.  Explained the risk of stroke, MI in the setting of untreated sleep apnea.  He will consider this and likely sleep with electrophysiology, Dr. [name] tomorrow

## 2022-08-04 NOTE — Patient Instructions (Addendum)
Consider cpap and please discuss with Dr [name].   You can investigate the [facility].   You you take sotalol '80mg'$  tonight as soon as you get home.   Please monitor your heart rate and know that a normal resting heart rates between 60 and 99.  If you have sustained elevated heart rate > 110 or develop chest pain, please call 911.  I want you to be safe.  Please call me tomorrow let me know how your appointment goes with Dr. [name].   Atrial Fibrillation  Atrial fibrillation is a type of heartbeat that is irregular or fast. If you have this condition, your heart beats without any order. This makes it hard for your heart to pump blood in a normal way. Atrial fibrillation may come and go, or it may become a long-lasting problem. If this condition is not treated, it can put you at higher risk for stroke, heart failure, and other heart problems. What are the causes? This condition may be caused by diseases that damage the heart. They include: High blood pressure. Heart failure. Heart valve disease. Heart surgery. Other causes include: Diabetes. Thyroid disease. Being overweight. Kidney disease. Sometimes the cause is not known. What increases the risk? You are more likely to develop this condition if: You are older. You smoke. You exercise often and very hard. You have a family history of this condition. You are a man. You use drugs. You drink a lot of alcohol. You have lung conditions, such as emphysema, pneumonia, or COPD. You have sleep apnea. What are the signs or symptoms? Common symptoms of this condition include: A feeling that your heart is beating very fast. Chest pain or discomfort. Feeling short of breath. Suddenly feeling light-headed or weak. Getting tired easily during activity. Fainting. Sweating. In some cases, there are no symptoms. How is this treated? Treatment for this condition depends on underlying conditions and how you feel when you have atrial  fibrillation. They include: Medicines to: Prevent blood clots. Treat heart rate or heart rhythm problems. Using devices, such as a pacemaker, to correct heart rhythm problems. Doing surgery to remove the part of the heart that sends bad signals. Closing an area where clots can form in the heart (left atrial appendage). In some cases, your doctor will treat other underlying conditions. Follow these instructions at home: Medicines Take over-the-counter and prescription medicines only as told by your doctor. Do not take any new medicines without first talking to your doctor. If you are taking blood thinners: Talk with your doctor before you take any medicines that have aspirin or NSAIDs, such as ibuprofen, in them. Take your medicine exactly as told by your doctor. Take it at the same time each day. Avoid activities that could hurt or bruise you. Follow instructions about how to prevent falls. Wear a bracelet that says you are taking blood thinners. Or, carry a card that lists what medicines you take. Lifestyle     Do not use any products that have nicotine or tobacco in them. These include cigarettes, e-cigarettes, and chewing tobacco. If you need help quitting, ask your doctor. Eat heart-healthy foods. Talk with your doctor about the right eating plan for you. Exercise regularly as told by your doctor. Do not drink alcohol. Lose weight if you are overweight. Do not use drugs, including cannabis. General instructions If you have a condition that causes breathing to stop for a short period of time (apnea), treat it as told by your doctor. Keep a healthy  weight. Do not use diet pills unless your doctor says they are safe for you. Diet pills may make heart problems worse. Keep all follow-up visits as told by your doctor. This is important. Contact a doctor if: You notice a change in the speed, rhythm, or strength of your heartbeat. You are taking a blood-thinning medicine and you get more  bruising. You get tired more easily when you move or exercise. You have a sudden change in weight. Get help right away if:  You have pain in your chest or your belly (abdomen). You have trouble breathing. You have side effects of blood thinners, such as blood in your vomit, poop (stool), or pee (urine), or bleeding that cannot stop. You have any signs of a stroke. "BE FAST" is an easy way to remember the main warning signs: B - Balance. Signs are dizziness, sudden trouble walking, or loss of balance. E - Eyes. Signs are trouble seeing or a change in how you see. F - Face. Signs are sudden weakness or loss of feeling in the face, or the face or eyelid drooping on one side. A - Arms. Signs are weakness or loss of feeling in an arm. This happens suddenly and usually on one side of the body. S - Speech. Signs are sudden trouble speaking, slurred speech, or trouble understanding what people say. T - Time. Time to call emergency services. Write down what time symptoms started. You have other signs of a stroke, such as: A sudden, very bad headache with no known cause. Feeling like you may vomit (nausea). Vomiting. A seizure. These symptoms may be an emergency. Do not wait to see if the symptoms will go away. Get medical help right away. Call your local emergency services (911 in the [place] Do not drive yourself to the hospital. Summary Atrial fibrillation is a type of heartbeat that is irregular or fast. You are at higher risk of this condition if you smoke, are older, have diabetes, or are overweight. Follow your doctor's instructions about medicines, diet, exercise, and follow-up visits. Get help right away if you have signs or symptoms of a stroke. Get help right away if you cannot catch your breath, or you have chest pain or discomfort. This information is not intended to replace advice given to you by your health care provider. Make sure you discuss any questions you have with your health  care provider. Document Revised: 01/05/2019 Document Reviewed: 01/05/2019 Elsevier Patient Education  [place] Maintenance, Male Adopting a healthy lifestyle and getting preventive care are important in promoting health and wellness. Ask your health care provider about: The right schedule for you to have regular tests and exams. Things you can do on your own to prevent diseases and keep yourself healthy. What should I know about diet, weight, and exercise? Eat a healthy diet  Eat a diet that includes plenty of vegetables, fruits, low-fat dairy products, and lean protein. Do not eat a lot of foods that are high in solid fats, added sugars, or sodium. Maintain a healthy weight Body mass index (BMI) is a measurement that can be used to identify possible weight problems. It estimates body fat based on height and weight. Your health care provider can help determine your BMI and help you achieve or maintain a healthy weight. Get regular exercise Get regular exercise. This is one of the most important things you can do for your health. Most adults should: Exercise for at least 150 minutes each  week. The exercise should increase your heart rate and make you sweat (moderate-intensity exercise). Do strengthening exercises at least twice a week. This is in addition to the moderate-intensity exercise. Spend less time sitting. Even light physical activity can be beneficial. Watch cholesterol and blood lipids Have your blood tested for lipids and cholesterol at 63 years of age, then have this test every 5 years. You may need to have your cholesterol levels checked more often if: Your lipid or cholesterol levels are high. You are older than 63 years of age. You are at high risk for heart disease. What should I know about cancer screening? Many types of cancers can be detected early and may often be prevented. Depending on your health history and family history, you may need to have  cancer screening at various ages. This may include screening for: Colorectal cancer. Prostate cancer. Skin cancer. Lung cancer. What should I know about heart disease, diabetes, and high blood pressure? Blood pressure and heart disease High blood pressure causes heart disease and increases the risk of stroke. This is more likely to develop in people who have high blood pressure readings or are overweight. Talk with your health care provider about your target blood pressure readings. Have your blood pressure checked: Every 3-5 years if you are 81-64 years of age. Every year if you are 31 years old or older. If you are between the ages of 57 and 75 and are a current or former smoker, ask your health care provider if you should have a one-time screening for abdominal aortic aneurysm (AAA). Diabetes Have regular diabetes screenings. This checks your fasting blood sugar level. Have the screening done: Once every three years after age 35 if you are at a normal weight and have a low risk for diabetes. More often and at a younger age if you are overweight or have a high risk for diabetes. What should I know about preventing infection? Hepatitis B If you have a higher risk for hepatitis B, you should be screened for this virus. Talk with your health care provider to find out if you are at risk for hepatitis B infection. Hepatitis C Blood testing is recommended for: Everyone born from [birth year] through 1965. Anyone with known risk factors for hepatitis C. Sexually transmitted infections (STIs) You should be screened each year for STIs, including gonorrhea and chlamydia, if: You are sexually active and are younger than 63 years of age. You are older than 63 years of age and your health care provider tells you that you are at risk for this type of infection. Your sexual activity has changed since you were last screened, and you are at increased risk for chlamydia or gonorrhea. Ask your health care  provider if you are at risk. Ask your health care provider about whether you are at high risk for HIV. Your health care provider may recommend a prescription medicine to help prevent HIV infection. If you choose to take medicine to prevent HIV, you should first get tested for HIV. You should then be tested every 3 months for as long as you are taking the medicine. Follow these instructions at home: Alcohol use Do not drink alcohol if your health care provider tells you not to drink. If you drink alcohol: Limit how much you have to 0-2 drinks a day. Know how much alcohol is in your drink. In the [place] one drink equals one 12 oz bottle of beer (355 mL), one 5 oz glass of wine (148 mL),  or one 1 oz glass of hard liquor (44 mL). Lifestyle Do not use any products that contain nicotine or tobacco. These products include cigarettes, chewing tobacco, and vaping devices, such as e-cigarettes. If you need help quitting, ask your health care provider. Do not use street drugs. Do not share needles. Ask your health care provider for help if you need support or information about quitting drugs. General instructions Schedule regular health, dental, and eye exams. Stay current with your vaccines. Tell your health care provider if: You often feel depressed. You have ever been abused or do not feel safe at home. Summary Adopting a healthy lifestyle and getting preventive care are important in promoting health and wellness. Follow your health care provider's instructions about healthy diet, exercising, and getting tested or screened for diseases. Follow your health care provider's instructions on monitoring your cholesterol and blood pressure. This information is not intended to replace advice given to you by your health care provider. Make sure you discuss any questions you have with your health care provider. Document Revised: 12/03/2020 Document Reviewed: 12/03/2020 Elsevier Patient Education  [place].

## 2022-08-04 NOTE — Progress Notes (Unsigned)
Assessment & Plan:  Annual physical exam -     CBC with Differential/Platelet -     Comprehensive metabolic panel -     TSH  Mixed hyperlipidemia  Primary hypertension -     CBC with Differential/Platelet -     Comprehensive metabolic panel  Obstructive sleep apnea Assessment & Plan: Dx Moderate sleep apnea 2015.  Again reiterated the importance of updating with sleep study and patient being compliant with CPAP specially in the setting of recurrent atrial fibrillation.  Explained the risk of stroke, MI in the setting of untreated sleep apnea.  He will consider this and likely sleep with electrophysiology, Dr. [name] tomorrow   Atrial fibrillation status post cardioversion [facility]) Assessment & Plan: Irregular irregular heart rate noted on exam today.  EKG showed atrial fibrillation with no acute changes when compared to previous EKG 08/2021.  Patient has not been compliant with sotalol 80 mg twice daily.  Previously on sotalol 120 mg twice daily.  Discussed stress, alcohol use, untreated sleep apnea is likely playing a role in return atrial fibrillation after ablation last summer.  Reviewed EKG with Dr Derrel Nip and we agreed patient to resume sotalol 80 mg twice daily and keep appointment with Dr. [name] 08/05/22.  CMA tried to call Dr. Manon Hilding office however unfortunately office closed at 4 PM we were unable to leave triage note.  Advised strict precautions if patient's heart rate were to be sustained over 110 bpm, he develops chest pain , sob, advised to report to emergency room or call 911.  Fortunately he has a pulse oximeter at home and verbalized understanding of monitoring heart rate, symptoms.  He will call and let me know how appointment goes with Dr. [name].  Orders: -     CBC with Differential/Platelet -     Comprehensive metabolic panel -     TSH -     EKG 12-Lead -     VITAMIN D 25 Hydroxy (Vit-D Deficiency, Fractures) -     Magnesium  History of vitamin D deficiency -      VITAMIN D 25 Hydroxy (Vit-D Deficiency, Fractures)  Routine physical examination Assessment & Plan:  Encourage continued exercise.  Patient politely delines Tdap vaccine today.      Return precautions given.   Risks, benefits, and alternatives of the medications and treatment plan prescribed today were discussed, and patient expressed understanding.   Education regarding symptom management and diagnosis given to patient on AVS either electronically or printed.  No follow-ups on file.  [name], FNP  Subjective:    Patient ID: Mozell Hardacre, male    DOB: 08-13-1960, 62 y.o.   MRN: [id number]  CC: Romell Wolden is a 62 y.o. male who presents today for physical exam.    HPI: H/o afib s/p ablation. He denies palpitations, CP.    He admits taking sotalol 80 mg once daily and forgetting to take medication on other days.  He has not taken medication today.    He endorses increased stress with recent break-up and then resignation of an employee today.  He has follow up with EP tomorrow.   Last seen by Dr. [name], [place] 01/2022.  S/p ablation 01/2022 ,  Decreased sotalol to 80 mg twice daily, 02/18/22.     He follows with dermatology.   Colorectal  Cancer Screening: UTD , 10/13/18, Dr Alice Reichert Prostate Cancer Screening: PSA 0/21 obtained 04/16/22.  No trouble urinating, pain with urination or urinary hesitancy.  Lung Cancer Screening: No  30 year pack year history and > 50 years to 84 years.    Immunizations       Tetanus - due        Labs: Screening labs today. Exercise: Gets regular exercise on peloton, golf.   Alcohol use:  regularly Smoking/tobacco use: former smoker.    Health Maintenance  Topic Date Due   DTaP/Tdap/Td (1 - Tdap) Never done   INFLUENZA VACCINE  10/26/2022 (Originally 02/25/2022)   Zoster Vaccines- Shingrix (1 of 2) 12/09/2022 (Originally 04/05/1979)   COLONOSCOPY (Pts 45-12yr Insurance coverage will need to be confirmed)  10/13/2023   Hepatitis C  Screening  Completed   HIV Screening  Completed   HPV VACCINES  Aged Out   COVID-19 Vaccine  Discontinued     ALLERGIES: Sulfa antibiotics, Sulfasalazine, Bactrim [sulfamethoxazole-trimethoprim], Lisinopril, and Zetia [ezetimibe]  Current Outpatient Medications on File Prior to Visit  Medication Sig Dispense Refill   azelastine (ASTELIN) 0.1 % nasal spray Place 1 spray into both nostrils 2 (two) times daily. Use in each nostril as directed 30 mL 4   rivaroxaban (XARELTO) 20 MG TABS tablet Take 20 mg by mouth at bedtime.      rosuvastatin (CRESTOR) 40 MG tablet Take 40 mg by mouth daily. Take 1 40 mg tablet once daily     sildenafil (VIAGRA) 50 MG tablet Take 0.5 tablets (25 mg total) by mouth daily as needed for erectile dysfunction (max '100mg'$ /dose up to 1 dose/day. Give 0.5-4h before sexual activity). 10 tablet 3   sotalol (BETAPACE) 80 MG tablet Take 80 mg by mouth 2 (two) times daily.     traZODone (DESYREL) 50 MG tablet Take 0.5-1 tablets (25-50 mg total) by mouth at bedtime as needed for sleep. 90 tablet 3   No current facility-administered medications on file prior to visit.    Review of Systems  Constitutional:  Negative for chills and fever.  HENT:  Negative for congestion and sinus pressure.   Respiratory:  Negative for cough, shortness of breath and wheezing.   Cardiovascular:  Negative for chest pain and palpitations.  Gastrointestinal:  Negative for diarrhea, nausea and vomiting.  Genitourinary:  Negative for dysuria.  Musculoskeletal:  Negative for myalgias.  Skin:  Negative for rash.  Neurological:  Negative for headaches.  Hematological:  Negative for adenopathy.      Objective:    BP 122/64   Pulse (!) 107   Temp 97.6 F (36.4 C)   Ht 6' (1.829 m)   Wt 220 lb 6.4 oz (100 kg)   SpO2 98%   BMI 29.89 kg/m   BP Readings from Last 3 Encounters:  08/04/22 122/64  04/16/22 136/80  02/27/22 132/88   Wt Readings from Last 3 Encounters:  08/04/22 220 lb 6.4  oz (100 kg)  04/16/22 218 lb (98.9 kg)  02/27/22 214 lb 3.2 oz (97.2 kg)    Physical Exam Vitals reviewed.  Constitutional:      Appearance: Normal appearance. He is well-developed.  Neck:     Thyroid: No thyroid mass or thyromegaly.  Cardiovascular:     Rate and Rhythm: Regular rhythm.     Heart sounds: Normal heart sounds.  Pulmonary:     Effort: Pulmonary effort is normal. No respiratory distress.     Breath sounds: Normal breath sounds. No wheezing, rhonchi or rales.  Abdominal:     General: Bowel sounds are normal. There is no distension.     Palpations: Abdomen is soft. Abdomen is not rigid. There  is no fluid wave or mass.     Tenderness: There is no abdominal tenderness. There is no guarding or rebound. Negative signs include Murphy's sign and McBurney's sign.  Lymphadenopathy:     Head:     Right side of head: No submental, submandibular, tonsillar, preauricular, posterior auricular or occipital adenopathy.     Left side of head: No submental, submandibular, tonsillar, preauricular, posterior auricular or occipital adenopathy.     Cervical: No cervical adenopathy.  Skin:    General: Skin is warm and dry.  Neurological:     Mental Status: He is alert.  Psychiatric:        Speech: Speech normal.        Behavior: Behavior normal.

## 2022-08-05 ENCOUNTER — Telehealth: Payer: Self-pay | Admitting: Family

## 2022-08-05 ENCOUNTER — Other Ambulatory Visit: Payer: Self-pay | Admitting: Family

## 2022-08-05 ENCOUNTER — Telehealth: Payer: Self-pay

## 2022-08-05 DIAGNOSIS — Z0000 Encounter for general adult medical examination without abnormal findings: Secondary | ICD-10-CM | POA: Insufficient documentation

## 2022-08-05 DIAGNOSIS — Z8639 Personal history of other endocrine, nutritional and metabolic disease: Secondary | ICD-10-CM

## 2022-08-05 DIAGNOSIS — R899 Unspecified abnormal finding in specimens from other organs, systems and tissues: Secondary | ICD-10-CM

## 2022-08-05 LAB — COMPREHENSIVE METABOLIC PANEL
ALT: 16 U/L (ref 0–53)
AST: 18 U/L (ref 0–37)
Albumin: 4.2 g/dL (ref 3.5–5.2)
Alkaline Phosphatase: 52 U/L (ref 39–117)
BUN: 18 mg/dL (ref 6–23)
CO2: 28 mEq/L (ref 19–32)
Calcium: 9.4 mg/dL (ref 8.4–10.5)
Chloride: 100 mEq/L (ref 96–112)
Creatinine, Ser: 1.39 mg/dL (ref 0.40–1.50)
GFR: 54.4 mL/min — ABNORMAL LOW (ref >60.00)
Glucose, Bld: 92 mg/dL (ref 70–99)
Potassium: 5.5 mEq/L — ABNORMAL HIGH (ref 3.5–5.1)
Sodium: 135 mEq/L (ref 135–145)
Total Bilirubin: 0.8 mg/dL (ref 0.2–1.2)
Total Protein: 7.5 g/dL (ref 6.0–8.3)

## 2022-08-05 LAB — MAGNESIUM: Magnesium: 2 mg/dL (ref 1.5–2.5)

## 2022-08-05 LAB — CBC WITH DIFFERENTIAL/PLATELET
Basophils Absolute: 0.1 10*3/uL (ref 0.0–0.1)
Basophils Relative: 0.7 % (ref 0.0–3.0)
Eosinophils Absolute: 0.3 10*3/uL (ref 0.0–0.7)
Eosinophils Relative: 3.5 % (ref 0.0–5.0)
HCT: 46.2 % (ref 39.0–52.0)
Hemoglobin: 15.6 g/dL (ref 13.0–17.0)
Lymphocytes Relative: 28.9 % (ref 12.0–46.0)
Lymphs Abs: 2.7 10*3/uL (ref 0.7–4.0)
MCHC: 33.8 g/dL (ref 30.0–36.0)
MCV: 91.2 fl (ref 78.0–100.0)
Monocytes Absolute: 1 10*3/uL (ref 0.1–1.0)
Monocytes Relative: 10.3 % (ref 3.0–12.0)
Neutro Abs: 5.2 10*3/uL (ref 1.4–7.7)
Neutrophils Relative %: 56.6 % (ref 43.0–77.0)
Platelets: 311 10*3/uL (ref 150.0–400.0)
RBC: 5.07 Mil/uL (ref 4.22–5.81)
RDW: 13.6 % (ref 11.5–15.5)
WBC: 9.2 10*3/uL (ref 4.0–10.5)

## 2022-08-05 LAB — TSH: TSH: 1.34 u[IU]/mL (ref 0.35–5.50)

## 2022-08-05 LAB — VITAMIN D 25 HYDROXY (VIT D DEFICIENCY, FRACTURES): VITD: 13.48 ng/mL — ABNORMAL LOW (ref 30.00–100.00)

## 2022-08-05 MED ORDER — CHOLECALCIFEROL 1.25 MG (50000 UT) PO TABS: ORAL_TABLET | ORAL | 0 refills | Status: DC

## 2022-08-05 NOTE — Progress Notes (Signed)
close

## 2022-08-05 NOTE — Addendum Note (Signed)
Addended by: Burnard Hawthorne on: 08/05/2022 01:53 PM   Modules accepted: Orders

## 2022-08-05 NOTE — Telephone Encounter (Signed)
LVM to call back.

## 2022-08-05 NOTE — Telephone Encounter (Signed)
Spoke with pt Feels well today He saw Dr [name]  He is plans to drink less alcohol He has been using otc electrolytes  He will have labs rechecked this week

## 2022-08-05 NOTE — Telephone Encounter (Signed)
Call pt How is he feeling? Did he see Dr [name] today for atrial fibrillation?

## 2022-08-05 NOTE — Telephone Encounter (Signed)
LVM to call back to go over results 

## 2022-08-05 NOTE — Telephone Encounter (Signed)
Jake Liu spoke to pt and went over results wit him and I scheduled him a lab appt for 08/06/22

## 2022-08-05 NOTE — Assessment & Plan Note (Signed)
Irregular irregular heart rate noted on exam today.  EKG showed atrial fibrillation with no acute changes when compared to previous EKG 08/2021.  Patient has not been compliant with sotalol 80 mg twice daily.  Previously on sotalol 120 mg twice daily.  Discussed stress, alcohol use, untreated sleep apnea is likely playing a role in return atrial fibrillation after ablation last summer.  Reviewed EKG with Dr Derrel Nip and we agreed patient to resume sotalol 80 mg twice daily and keep appointment with Dr. [name] 08/05/22.  CMA tried to call Dr. Manon Hilding office however unfortunately office closed at 4 PM we were unable to leave triage note.  Advised strict precautions if patient's heart rate were to be sustained over 110 bpm, he develops chest pain , sob, advised to report to emergency room or call 911.  Fortunately he has a pulse oximeter at home and verbalized understanding of monitoring heart rate, symptoms.  He will call and let me know how appointment goes with Dr. [name].

## 2022-08-05 NOTE — Assessment & Plan Note (Addendum)
Encourage continued exercise.  Patient politely delines Tdap vaccine today.

## 2022-08-06 ENCOUNTER — Other Ambulatory Visit (INDEPENDENT_AMBULATORY_CARE_PROVIDER_SITE_OTHER): Payer: Commercial Managed Care - PPO

## 2022-08-06 DIAGNOSIS — R899 Unspecified abnormal finding in specimens from other organs, systems and tissues: Secondary | ICD-10-CM

## 2022-08-06 LAB — BASIC METABOLIC PANEL
BUN: 12 mg/dL (ref 6–23)
CO2: 26 mEq/L (ref 19–32)
Calcium: 9.4 mg/dL (ref 8.4–10.5)
Chloride: 102 mEq/L (ref 96–112)
Creatinine, Ser: 0.74 mg/dL (ref 0.40–1.50)
GFR: 97.23 mL/min (ref >60.00)
Glucose, Bld: 123 mg/dL — ABNORMAL HIGH (ref 70–99)
Potassium: 4.7 mEq/L (ref 3.5–5.1)
Sodium: 137 mEq/L (ref 135–145)

## 2022-08-27 ENCOUNTER — Ambulatory Visit: Payer: Commercial Managed Care - PPO | Admitting: Dermatology

## 2022-08-27 ENCOUNTER — Encounter: Payer: Self-pay | Admitting: Dermatology

## 2022-08-27 VITALS — BP 126/79 | HR 64

## 2022-08-27 DIAGNOSIS — Z1283 Encounter for screening for malignant neoplasm of skin: Secondary | ICD-10-CM | POA: Diagnosis not present

## 2022-08-27 DIAGNOSIS — L578 Other skin changes due to chronic exposure to nonionizing radiation: Secondary | ICD-10-CM | POA: Diagnosis not present

## 2022-08-27 DIAGNOSIS — L814 Other melanin hyperpigmentation: Secondary | ICD-10-CM

## 2022-08-27 DIAGNOSIS — D225 Melanocytic nevi of trunk: Secondary | ICD-10-CM

## 2022-08-27 DIAGNOSIS — D692 Other nonthrombocytopenic purpura: Secondary | ICD-10-CM

## 2022-08-27 DIAGNOSIS — D1801 Hemangioma of skin and subcutaneous tissue: Secondary | ICD-10-CM

## 2022-08-27 DIAGNOSIS — L719 Rosacea, unspecified: Secondary | ICD-10-CM

## 2022-08-27 DIAGNOSIS — L570 Actinic keratosis: Secondary | ICD-10-CM | POA: Diagnosis not present

## 2022-08-27 DIAGNOSIS — D229 Melanocytic nevi, unspecified: Secondary | ICD-10-CM

## 2022-08-27 DIAGNOSIS — L821 Other seborrheic keratosis: Secondary | ICD-10-CM

## 2022-08-27 DIAGNOSIS — L918 Other hypertrophic disorders of the skin: Secondary | ICD-10-CM

## 2022-08-27 NOTE — Patient Instructions (Addendum)
Actinic Keratosis  What is an actinic keratosis? An actinic keratosis (plural: actinic keratoses) is growth on the surface of the skin that usually appears as a red, hard, crusty or scaly bump.   What causes actinic keratoses? Repeated prolonged sun exposure causes skin damage, especially in fair-skinned persons. Sun-damaged skin becomes dry and wrinkled and may form rough, scaly spots called actinic keratoses. These rough spots remain on the skin even though the crust or scale on top is picked off.   Why treat actinic keratoses? Actinic keratoses are not skin cancers, but because they may sometimes turn cancerous they are called "pre-cancerous". Not all will turn to skin cancer, and it usually takes several years for this to happen. Because it is much easier to treat an actinic keratosis then it is to remove a skin cancer, actinic keratoses should be treated to prevent future skin cancer.   How are actinic keratoses treated? The most common way of treating actinic keratoses is to freeze them with liquid nitrogen. Freezing causes scabbing and shedding of the sun-damaged skin. Healing after a removal usually takes two weeks, depending on the size and location of the keratosis. Hands and legs heal more slowly than the face. The skin's final appearance is usually excellent. There are several topical medications that can be used to treat actinic keratoses. These medications generally have side effects of redness, crusting, and pain. Some are used for a few days, and some for several months before the actinic keratosis is completely gone. Photodynamic therapy is another alternative to freezing actinic keratoses. This treatment is done in a physician's office. A medication is applied to the area of skin with actinic keratoses, and it is allowed to soak in for one or more hours. A special light is then applied to the skin. Side effects include redness, burning, and peeling.  How can you prevent actinic  keratoses? Protection from the sun is the best way to prevent actinic keratoses. The use of proper clothing and sunscreens can prevent the sun damage that leads to an actinic keratosis.  Unfortunately, some sun damage is permanent. Once sun damage has progressed to the point where actinic keratoses develop, new keratoses may appear even without further sun exposure. However, even in skin that is already heavily sun damaged, good sun protection can help reduce the number of actinic keratoses that will appear.    Cryotherapy Aftercare  Wash gently with soap and water everyday.   Apply Vaseline and Band-Aid daily until healed.    Due to recent changes in healthcare laws, you may see results of your pathology and/or laboratory studies on MyChart before the doctors have had a chance to review them. We understand that in some cases there may be results that are confusing or concerning to you. Please understand that not all results are received at the same time and often the doctors may need to interpret multiple results in order to provide you with the best plan of care or course of treatment. Therefore, we ask that you please give [place] 2 business days to thoroughly review all your results before contacting the office for clarification. Should we see a critical lab result, you will be contacted sooner.   If You Need Anything After Your Visit  If you have any questions or concerns for your doctor, please call our main line at [phone] and press option 4 to reach your doctor's medical assistant. If no one answers, please leave a voicemail as directed and we will return your  call as soon as possible. Messages left after 4 pm will be answered the following business day.   You may also send [place] a message via [place]. We typically respond to MyChart messages within 1-2 business days.  For prescription refills, please ask your pharmacy to contact our office. Our fax number is [phone].  If you have an  urgent issue when the clinic is closed that cannot wait until the next business day, you can page your doctor at the number below.    Please note that while we do our best to be available for urgent issues outside of office hours, we are not available 24/7.   If you have an urgent issue and are unable to reach [place], you may choose to seek medical care at your doctor's office, retail clinic, urgent care center, or emergency room.  If you have a medical emergency, please immediately call 911 or go to the emergency department.  Pager Numbers  - Dr. Nehemiah Massed: [phone]  - Dr. Laurence Ferrari: [phone]  - Dr. Nicole Kindred: [phone]  In the event of inclement weather, please call our main line at [phone] for an update on the status of any delays or closures.  Dermatology Medication Tips: Please keep the boxes that topical medications come in in order to help keep track of the instructions about where and how to use these. Pharmacies typically print the medication instructions only on the boxes and not directly on the medication tubes.   If your medication is too expensive, please contact our office at [phone] option 4 or send [place] a message through [place].   We are unable to tell what your co-pay for medications will be in advance as this is different depending on your insurance coverage. However, we may be able to find a substitute medication at lower cost or fill out paperwork to get insurance to cover a needed medication.   If a prior authorization is required to get your medication covered by your insurance company, please allow [place] 1-2 business days to complete this process.  Drug prices often vary depending on where the prescription is filled and some pharmacies may offer cheaper prices.  The website www.goodrx.com contains coupons for medications through different pharmacies. The prices here do not account for what the cost may be with help from insurance (it may be cheaper with your  insurance), but the website can give you the price if you did not use any insurance.  - You can print the associated coupon and take it with your prescription to the pharmacy.  - You may also stop by our office during regular business hours and pick up a GoodRx coupon card.  - If you need your prescription sent electronically to a different pharmacy, notify our office through [facility] or by phone at [phone] option 4.     Si Usted Necesita Algo Despus de Su Visita  Tambin puede enviarnos un mensaje a travs de Pharmacist, community. Por lo general respondemos a los mensajes de MyChart en el transcurso de 1 a 2 das hbiles.  Para renovar recetas, por favor pida a su farmacia que se ponga en contacto con nuestra oficina. Harland Dingwall de fax es [place] [phone].  Si tiene un asunto urgente cuando la clnica est cerrada y que no puede esperar hasta el siguiente da hbil, puede llamar/localizar a su doctor(a) al nmero que aparece a continuacin.   Por favor, tenga en cuenta que aunque hacemos todo lo posible para estar disponibles para asuntos urgentes fuera  del horario de oficina, no estamos disponibles las 24 horas del da, los 7 das de la [place].   Si tiene un problema urgente y no puede comunicarse con nosotros, puede optar por buscar atencin mdica  en el consultorio de su doctor(a), en una clnica privada, en un centro de atencin urgente o en una sala de emergencias.  Si tiene Engineering geologist, por favor llame inmediatamente al 911 o vaya a la sala de emergencias.  Nmeros de bper  - Dr. Nehemiah Massed: [phone]  - Dra. Moye: [phone]  - Dra. Nicole Kindred: [phone]  En caso de inclemencias del [place], por favor llame a Johnsie Kindred principal al [phone] para una actualizacin sobre el [place] de cualquier retraso o cierre.  Consejos para la medicacin en dermatologa: Por favor, guarde las cajas en las que vienen los medicamentos de uso tpico para ayudarle a  seguir las instrucciones sobre dnde y cmo usarlos. Las farmacias generalmente imprimen las instrucciones del medicamento slo en las cajas y no directamente en los tubos del [place].   Si su medicamento es muy caro, por favor, pngase en contacto con Zigmund Daniel llamando al [phone] y presione la opcin 4 o envenos un mensaje a travs de Pharmacist, community.   No podemos decirle cul ser su copago por los medicamentos por adelantado ya que esto es diferente dependiendo de la cobertura de su seguro. Sin embargo, es posible que podamos encontrar un medicamento sustituto a Electrical engineer un formulario para que el seguro cubra el medicamento que se considera necesario.   Si se requiere una autorizacin previa para que su compaa de seguros [place] su medicamento, por favor permtanos de 1 a 2 das hbiles para completar este proceso.  Los precios de los medicamentos varan con frecuencia dependiendo del Environmental consultant de dnde se surte la receta y alguna farmacias pueden ofrecer precios ms baratos.  El sitio web www.goodrx.com tiene cupones para medicamentos de Airline pilot. Los precios aqu no tienen en cuenta lo que podra costar con la ayuda del seguro (puede ser ms barato con su seguro), pero el sitio web puede darle el precio si no utiliz Research scientist (physical sciences).  - Puede imprimir el cupn correspondiente y llevarlo con su receta a la farmacia.  - Tambin puede pasar por nuestra oficina durante el horario de atencin regular y Charity fundraiser una tarjeta de cupones de GoodRx.  - Si necesita que su receta se enve electrnicamente a una farmacia diferente, informe a nuestra oficina a travs de MyChart de [place] o por telfono llamando al [phone] y presione la opcin 4.

## 2022-08-27 NOTE — Progress Notes (Signed)
Follow-Up Visit   Subjective  Jake Liu is a 62 y.o. male who presents for the following: Upper body skin exam (No hx of skin cancer, no fhx of skin cancer). The patient presents for Upper Body Skin Exam (UBSE) for skin cancer screening and mole check.  The patient has spots, moles and lesions to be evaluated, some may be new or changing and the patient has concerns that these could be cancer.   The following portions of the chart were reviewed this encounter and updated as appropriate:   Tobacco  Allergies  Meds  Problems  Med Hx  Surg Hx  Fam Hx     Review of Systems:  No other skin or systemic complaints except as noted in HPI or Assessment and Plan.  Objective  Well appearing patient in no apparent distress; mood and affect are within normal limits.  All skin waist up examined.  face Erythema face  R cheek x 1 Pink scaly macules   Assessment & Plan   Lentigines - Scattered tan macules - Due to sun exposure - Benign-appearing, observe - Recommend daily broad spectrum sunscreen SPF 30+ to sun-exposed areas, reapply every 2 hours as needed. - Call for any changes - back, face  Seborrheic Keratoses - Stuck-on, waxy, tan-brown papules and/or plaques  - Benign-appearing - Discussed benign etiology and prognosis. - Observe - Call for any changes - back, face  Melanocytic Nevi - Tan-brown and/or pink-flesh-colored symmetric macules and papules - Benign appearing on exam today - Observation - Call clinic for new or changing moles - Recommend daily use of broad spectrum spf 30+ sunscreen to sun-exposed areas.  - trunk  Hemangiomas - Red papules - Discussed benign nature - Observe - Call for any changes - trunk  Purpura - Chronic; persistent and recurrent.  Treatable, but not curable. - Violaceous macules and patches - Benign - Related to trauma, age, sun damage and/or use of blood thinners, chronic use of topical and/or oral steroids - Observe -  Can use OTC arnica containing moisturizer such as Dermend Bruise Formula if desired - Call for worsening or other concerns  - arms  Acrochordons (Skin Tags) - Fleshy, skin-colored pedunculated papules - Benign appearing.  - Observe. - If desired, they can be removed with an in office procedure that is not covered by insurance. - Please call the clinic if you notice any new or changing lesions.  - R axilla  Actinic Damage - Chronic condition, secondary to cumulative UV/sun exposure - diffuse scaly erythematous macules with underlying dyspigmentation - Recommend daily broad spectrum sunscreen SPF 30+ to sun-exposed areas, reapply every 2 hours as needed.  - Staying in the shade or wearing long sleeves, sun glasses (UVA+UVB protection) and wide brim hats (4-inch brim around the entire circumference of the hat) are also recommended for sun protection.  - Call for new or changing lesions. - face  Skin cancer screening performed today.   Rosacea face Rosacea is a chronic progressive skin condition usually affecting the face of adults, causing redness and/or acne bumps. It is treatable but not curable. It sometimes affects the eyes (ocular rosacea) as well. It may respond to topical and/or systemic medication and can flare with stress, sun exposure, alcohol, exercise, topical steroids (including hydrocortisone/cortisone 10) and some foods.  Daily application of broad spectrum spf 30+ sunscreen to face is recommended to reduce flares.  Previously sent in SM  Oxymetazoline: 1%, Ivermectin: 1%, Niacinamide: 2% and pt did not get, and declines  treatment today  AK (actinic keratosis) R cheek x 1 Destruction of lesion - R cheek x 1 Complexity: simple   Destruction method: cryotherapy   Informed consent: discussed and consent obtained   Timeout:  patient name, date of birth, surgical site, and procedure verified Lesion destroyed using liquid nitrogen: Yes   Region frozen until ice ball extended  beyond lesion: Yes   Outcome: patient tolerated procedure well with no complications   Post-procedure details: wound care instructions given    Return in about 1 year (around 08/28/2023) for TBSE, Hx of AKs.  I, Othelia Pulling, RMA, am acting as scribe for Sarina Ser, MD . Documentation: I have reviewed the above documentation for accuracy and completeness, and I agree with the above.  Sarina Ser, MD

## 2022-08-28 DIAGNOSIS — E559 Vitamin D deficiency, unspecified: Secondary | ICD-10-CM | POA: Insufficient documentation

## 2022-08-30 ENCOUNTER — Encounter: Payer: Self-pay | Admitting: Dermatology

## 2022-09-15 ENCOUNTER — Encounter: Payer: Self-pay | Admitting: Family Medicine

## 2022-09-15 ENCOUNTER — Ambulatory Visit: Payer: Self-pay

## 2022-09-15 ENCOUNTER — Ambulatory Visit: Payer: Commercial Managed Care - PPO | Admitting: Family Medicine

## 2022-09-15 ENCOUNTER — Ambulatory Visit (INDEPENDENT_AMBULATORY_CARE_PROVIDER_SITE_OTHER): Payer: Commercial Managed Care - PPO

## 2022-09-15 VITALS — BP 122/76 | HR 64 | Ht 72.0 in | Wt 216.8 lb

## 2022-09-15 DIAGNOSIS — G8929 Other chronic pain: Secondary | ICD-10-CM

## 2022-09-15 DIAGNOSIS — M25562 Pain in left knee: Secondary | ICD-10-CM | POA: Diagnosis not present

## 2022-09-15 NOTE — Patient Instructions (Signed)
Thank you for coming in today.   Please get an Xray today before you leave   Call or go to the ER if you develop a large red swollen joint with extreme pain or oozing puss.    Let me know how this goes.

## 2022-09-15 NOTE — Progress Notes (Unsigned)
   I, Jake Liu, LAT, ATC acting as a scribe for Lynne Leader, MD.  Jake Liu is a 62 y.o. male who presents to [place] at [facility] today for L knee pain. Pt was previously seen by Dr. Georgina Snell in 2021 for polyarthalgia and seronegative arthritis and is currently being treatment by rheum at the [facility]. Today, pt reports L knee pain x 1 day, injured while playing Pickle Ball yesterday. Pt locates pain to lateral aspect of the knee. Deep joint pain with flexion. Swelling immediately. Denies pop at time of injury. Notes past injury to the knee, 30 years ago, hx of arthroscopic surgery and steroid injections. Took Tylenol with some relief. Sx worse when laying in bed last night, side sleeper.   L Knee swelling: yes Mechanical symptoms: denies Aggravates: flexion, side-sleeping Treatments tried: Tylenol  Dx testing: 08/28/19 L knee MRI  08/06/19 L knee XR & L LE vasc US  Pertinent review of systems: ***  Relevant historical information: ***   Exam:  There were no vitals taken for this visit. General: Well Developed, well nourished, and in no acute distress.   MSK: ***    Lab and Radiology Results No results found for this or any previous visit (from the past 72 hour(s)). No results found.     Assessment and Plan: 62 y.o. male with ***   PDMP not reviewed this encounter. No orders of the defined types were placed in this encounter.  No orders of the defined types were placed in this encounter.    Discussed warning signs or symptoms. Please see discharge instructions. Patient expresses understanding.   ***

## 2022-09-18 NOTE — Progress Notes (Signed)
Left knee x-ray shows arthritis changes.

## 2022-09-29 ENCOUNTER — Encounter: Payer: Self-pay | Admitting: Family Medicine

## 2022-09-29 ENCOUNTER — Ambulatory Visit (INDEPENDENT_AMBULATORY_CARE_PROVIDER_SITE_OTHER): Payer: Commercial Managed Care - PPO

## 2022-09-29 ENCOUNTER — Ambulatory Visit: Payer: Self-pay

## 2022-09-29 ENCOUNTER — Ambulatory Visit: Payer: Commercial Managed Care - PPO | Admitting: Family Medicine

## 2022-09-29 VITALS — BP 118/82 | HR 58 | Ht 72.0 in | Wt 215.2 lb

## 2022-09-29 DIAGNOSIS — S46011A Strain of muscle(s) and tendon(s) of the rotator cuff of right shoulder, initial encounter: Secondary | ICD-10-CM | POA: Diagnosis not present

## 2022-09-29 DIAGNOSIS — M25511 Pain in right shoulder: Secondary | ICD-10-CM

## 2022-09-29 NOTE — Progress Notes (Signed)
I, Jake Liu, CMA acting as a scribe for Jake Leader, MD.  Jake Liu is a 62 y.o. male who presents to [place] at [facility] today for shoulder pain. Of note, pt is currently being treatment by rheum at the [facility] for polyarthalgia and seronegative arthritis.  Patient was last seen by Jake Liu on 09/15/2022 for chronic left knee pain.  Today, patient reports right shoulder pain x 1 week. [name] while playing [employer], landed on right shoulder and chest. .  Patient locates pain to anterior and top of shoulder. Sx causing night disturbance. Was able to swing paddle while playing yesterday.  Has been doing HEP provided by PT from when he had frozen shoulder.   Neck pain: no Radiates: no UE numbness/tingling: no UE weakness: slight Aggravates: side sleeping, reaching overhead Treatments tried: Tylenol 50 mg 2 tablets daily x 1 week, some relief with this.   Dx imaging: 09/08/19 R & L shoulder x-ray  Pertinent review of systems: No fevers or chills  Relevant historical information: Seronegative rheumatoid arthritis and was on steroids for a while.   Exam:  BP 118/82   Pulse (!) 58   Ht 6' (1.829 m)   Wt 215 lb 3.2 oz (97.6 kg)   SpO2 96%   BMI 29.19 kg/m  General: Well Developed, well nourished, and in no acute distress.   MSK: Right shoulder: Bruising present on the anterior chest wall.  Otherwise normal. Normal shoulder motion. Strength 4/5 to abduction.  4/5 external rotation 5/5 internal rotation. Positive Hawkins and Neer's test. Positive empty can test. Negative Yergason's and speeds test.    Lab and Radiology Results  Diagnostic Limited MSK Ultrasound of: Right shoulder Biceps tendon is intact. Subscapularis tendon intact. Supraspinatus tendon small hypoechoic structure present in the midportion of the distal supraspinatus tendon indicates a partial interstitial rotator cuff tear without retraction. Infraspinatus tendon  normal-appearing without tear. AC joint degenerative appearing Impression: Concerning for partial supraspinatus rotator cuff tear without retraction.  X-ray images right shoulder obtained today personally and independently interpreted. No acute fractures are visible. Mild before meals and glenohumeral DJD is visible. Await formal radiology review    Assessment and Plan: 62 y.o. male with right shoulder pain after a fall.  Ultrasound and physical exam are concerning for a partial rotator cuff tear.  Based on his good shoulder range of motion and reasonably intact strength and appearance on ultrasound I think this should be treatable without surgery.  Plan to refer to physical therapy.  Recheck in 6 weeks.  If not doing well MRI would be helpful.  We talked about return to play precautions for pickleball and golf.  I do not think he should be playing aggressive competitive pickleball until he is feeling a lot better.  For golf he can start taking some easy swings with the high number to irons when he feels better.   PDMP not reviewed this encounter. Orders Placed This Encounter  Procedures   [place] LIMITED JOINT SPACE STRUCTURES UP RIGHT(NO LINKED CHARGES)    Order Specific Question:   Reason for Exam (SYMPTOM  OR DIAGNOSIS REQUIRED)    Answer:   [name] while playing [place], R shoulder/chest/rib pain and bruising    Order Specific Question:   Preferred imaging location?    Answer:   [facility]   DG Shoulder Right    Standing Status:   Future    Number of Occurrences:   1  Standing Expiration Date:   09/29/2023    Order Specific Question:   Reason for Exam (SYMPTOM  OR DIAGNOSIS REQUIRED)    Answer:   injury playing Pickle Ball    Order Specific Question:   Preferred imaging location?    Answer:   [facility]   Ambulatory referral to Physical Therapy    Referral Priority:   Routine    Referral Type:   Physical Medicine    Referral Reason:   Specialty  Services Required    Requested Specialty:   Physical Therapy    Number of Visits Requested:   1   No orders of the defined types were placed in this encounter.    Discussed warning signs or symptoms. Please see discharge instructions. Patient expresses understanding.   The above documentation has been reviewed and is accurate and complete Jake Liu, M.D.

## 2022-09-29 NOTE — Patient Instructions (Signed)
Thank you for coming in today.   Please get an Xray today before you leave   I've referred you to Physical Therapy.  Let us know if you don't hear from them in one week.   Recheck in 6 weeks.   Let me know if this is not working.   Ok to [employer] but hold off on competitive games until better.   Golf is ok once you feel better.   Use the short clubs first.

## 2022-09-30 NOTE — Progress Notes (Signed)
Right shoulder x-ray looks normal to radiology

## 2022-11-07 NOTE — Progress Notes (Deleted)
   Rubin Payor, PhD, LAT, ATC acting as a scribe for Jake Graham, MD.  Jake Liu is a 62 y.o. male who presents to [facility] at [facility] today for f/u R shoulder pain caused by a fall playing pickle ball, concerning for a partial RC tear. Pt was last seen by Dr. Denyse Amass on 09/29/22 and was referred to PT, but has never scheduled any visits. Today, pt reports ***  Dx imaging: 09/08/19 R & L shoulder x-ray   Pertinent review of systems: ***  Relevant historical information: ***   Exam:  There were no vitals taken for this visit. General: Well Developed, well nourished, and in no acute distress.   MSK: ***    Lab and Radiology Results No results found for this or any previous visit (from the past 72 hour(s)). No results found.     Assessment and Plan: 62 y.o. male with ***   PDMP not reviewed this encounter. No orders of the defined types were placed in this encounter.  No orders of the defined types were placed in this encounter.    Discussed warning signs or symptoms. Please see discharge instructions. Patient expresses understanding.   ***

## 2022-11-10 ENCOUNTER — Ambulatory Visit: Payer: Commercial Managed Care - PPO | Admitting: Family Medicine

## 2023-03-04 ENCOUNTER — Ambulatory Visit: Payer: Commercial Managed Care - PPO | Admitting: Family

## 2023-03-04 ENCOUNTER — Ambulatory Visit: Payer: Commercial Managed Care - PPO

## 2023-03-04 ENCOUNTER — Telehealth: Payer: Self-pay | Admitting: Family

## 2023-03-04 ENCOUNTER — Encounter: Payer: Self-pay | Admitting: Family

## 2023-03-04 VITALS — BP 120/78 | HR 93 | Temp 97.8°F | Ht 72.0 in | Wt 196.0 lb

## 2023-03-04 DIAGNOSIS — N529 Male erectile dysfunction, unspecified: Secondary | ICD-10-CM

## 2023-03-04 DIAGNOSIS — Q678 Other congenital deformities of chest: Secondary | ICD-10-CM | POA: Diagnosis not present

## 2023-03-04 DIAGNOSIS — L609 Nail disorder, unspecified: Secondary | ICD-10-CM | POA: Insufficient documentation

## 2023-03-04 DIAGNOSIS — R0981 Nasal congestion: Secondary | ICD-10-CM

## 2023-03-04 DIAGNOSIS — I4891 Unspecified atrial fibrillation: Secondary | ICD-10-CM

## 2023-03-04 MED ORDER — TADALAFIL 5 MG PO TABS
2.5000 mg | ORAL_TABLET | Freq: Every day | ORAL | 3 refills | Status: DC | PRN
Start: 2023-03-04 — End: 2023-03-11

## 2023-03-04 MED ORDER — AZELASTINE HCL 0.1 % NA SOLN: 1.0000 | Freq: Two times a day (BID) | NASAL | 4 refills | Status: AC

## 2023-03-04 NOTE — Assessment & Plan Note (Signed)
Fortunately patient is asymptomatic.  Rate is well-controlled today, heart rate 93.  EKG obtained hide on exam, heart rate irregularly irregular.  EKG showing atrial fibrillation without acute changes when compared to previous EKG 08/04/22.  We have a call out to [facility] office, [facility] cardiology and are also faxing EKG your office to arrange sooner appointment to discuss treatment whether ablation, pacemaker.  Strict return precautions advised patient have advised if he were experienced chest pain palpitations he would need to go to the emergency room for evaluation while we await cardiology follow-up .  Continue Bet[place]ace, Xarelto as managed by cardiology

## 2023-03-04 NOTE — Telephone Encounter (Signed)
Call [facility] cardiology, dr Maisie Fus  Pt is back in afib  Rate is well controlled 82.  He is compliant with sotalol 80 mg twice daily, Xarelto 20 mg once daily.  He is concerned in regards to if he would require another ablation or consider pacemaker  We need to sch him an appt asap, please let me know after scheduled with cardiology  Please fax EKG from today and let nurse    [facility] [address]  [place], [place] [address]  [phone] (Work)  [phone] (Fax)

## 2023-03-04 NOTE — Progress Notes (Signed)
Assessment & Plan:  Chest wall asymmetry Assessment & Plan: Question prominence over I suspect third left-sided rib.  Patient has loss 15 pounds and I question if more obvious d/t weight loss.  No identifiable lymphadenopathy.  Pending chest x-ray.   Orders: -     DG Ribs Bilateral; Future -     DG Chest 2 View; Future  Nail lesion Assessment & Plan: Concerned for hemorrhagic lesion versus melanoma. I have shared with Dr [name] in regards to follow up in his office versus if I should place referral to dermatology at [facility], nail specialist, Beatrix Fetters   Sinus congestion -     Azelastine HCl; Place 1 spray into both nostrils 2 (two) times daily. Use in each nostril as directed  Dispense: 30 mL; Refill: 4  Erectile dysfunction, unspecified erectile dysfunction type Assessment & Plan: Suboptimal control, stop viagra as ineffective.  Trial of Cialis 2.5mg  prn.    Orders: -     Tadalafil; Take 0.5-1 tablets (2.5-5 mg total) by mouth daily as needed for erectile dysfunction. Max: 20mg /dose up to 1dose/24h  Dispense: 10 tablet; Refill: 3  Atrial fibrillation, unspecified type (HCC) -     EKG 12-Lead  Atrial fibrillation status post cardioversion [facility]) Assessment & Plan: Fortunately patient is asymptomatic.  Rate is well-controlled today, heart rate 93.  EKG obtained hide on exam, heart rate irregularly irregular.  EKG showing atrial fibrillation without acute changes when compared to previous EKG 08/04/22.  We have a call out to [facility] office, [facility] and are also faxing EKG your office to arrange sooner appointment to discuss treatment whether ablation, pacemaker.  Strict return precautions advised patient have advised if he were experienced chest pain palpitations he would need to go to the emergency room for evaluation while we await cardiology follow-up .  Continue Betapace, Xarelto as managed by cardiology      Return precautions given.   Risks, benefits, and  alternatives of the medications and treatment plan prescribed today were discussed, and patient expressed understanding.   Education regarding symptom management and diagnosis given to patient on AVS either electronically or printed.  Return in about 3 months (around 06/04/2023).  Rennie Plowman, FNP  Subjective:    Patient ID: Jake Liu, male    DOB: 05-02-1961, 62 y.o.   MRN: [id number]  CC: Jake Liu is a 62 y.o. male who presents today for follow up.   HPI: Right great toenail lesion  x 6 months unchanged.   Nontender.  No known injury.   He would like to change from viagra to cialis. He has taken cialis in the past which was more effective for erectile dysfunction.   He is not taking nitrate. No h/o MI.   He has taken cialias without prispism, visual changes.   He has decreased caffeine and alcohol use. He has lost weight. He has noticed his left chest is larger than right.   Denies shortness of breath, chest pain or palpitations.        Follow-up rheumatology, Dr. Allena Katz 11/28/2022 for rheumatoid arthritis.  He is no longer on methotrexate.  Continued on Humira. Follow-up cardiology 10/24/2022, PA Lum Babe for persistent atrial fibrillation Status post atrial flutter ablation 09/03/2022. Compliant with Betapace, Xarelto   Allergies: Sulfa antibiotics, Sulfasalazine, Bactrim [sulfamethoxazole-trimethoprim], Lisinopril, and Zetia [ezetimibe] Current Outpatient Medications on File Prior to Visit  Medication Sig Dispense Refill   Cholecalciferol 1.25 MG (50000 UT) TABS 50,000 units PO qwk for 8 weeks. 8 tablet  0   rivaroxaban (XARELTO) 20 MG TABS tablet Take 20 mg by mouth at bedtime.      rosuvastatin (CRESTOR) 40 MG tablet Take 40 mg by mouth daily. Take 1 40 mg tablet once daily     sotalol (BETAPACE) 80 MG tablet Take 80 mg by mouth 2 (two) times daily.     No current facility-administered medications on file prior to visit.    Review of Systems   Constitutional:  Negative for chills and fever.  Respiratory:  Negative for cough.   Cardiovascular:  Negative for chest pain and palpitations.  Gastrointestinal:  Negative for nausea and vomiting.      Objective:    BP 120/78   Pulse 93   Temp 97.8 F (36.6 C) (Oral)   Ht 6' (1.829 m)   Wt 196 lb (88.9 kg)   SpO2 98%   BMI 26.58 kg/m  BP Readings from Last 3 Encounters:  03/04/23 120/78  09/29/22 118/82  09/15/22 122/76   Wt Readings from Last 3 Encounters:  03/04/23 196 lb (88.9 kg)  09/29/22 215 lb 3.2 oz (97.6 kg)  09/15/22 216 lb 12.8 oz (98.3 kg)    Physical Exam Vitals reviewed.  Constitutional:      Appearance: He is well-developed.  Neck:     Thyroid: No thyroid mass or thyromegaly.  Cardiovascular:     Rate and Rhythm: Rhythm regularly irregular.     Heart sounds: Normal heart sounds.  Pulmonary:     Effort: Pulmonary effort is normal. No respiratory distress.     Breath sounds: Normal breath sounds. No wheezing, rhonchi or rales.  Chest:       Comments: Bony prominence over left.  Poorly circumscribed.  Nontender.  Nonboggy.  Skin is intact Lymphadenopathy:     Head:     Right side of head: No submental, submandibular, tonsillar, preauricular, posterior auricular or occipital adenopathy.     Left side of head: No submental, submandibular, tonsillar, preauricular, posterior auricular or occipital adenopathy.     Cervical: No cervical adenopathy.  Skin:    General: Skin is warm and dry.  Neurological:     Mental Status: He is alert.  Psychiatric:        Speech: Speech normal.        Behavior: Behavior normal.    Right great toe maroon to black lesion.  No streak. Non tender.      I have spent 40 minutes with a patient including precharting, exam, reviewing medical records, and discussion plan of care.

## 2023-03-04 NOTE — Assessment & Plan Note (Signed)
Question prominence over I suspect third left-sided rib.  Patient has loss 15 pounds and I question if more obvious d/t weight loss.  No identifiable lymphadenopathy.  Pending chest x-ray.

## 2023-03-04 NOTE — Assessment & Plan Note (Addendum)
Concerned for hemorrhagic lesion versus melanoma. I have shared with Dr [name] in regards to follow up in his office versus if I should place referral to dermatology at [facility], nail specialist, Beatrix Fetters

## 2023-03-04 NOTE — Patient Instructions (Addendum)
Will consult with dermatology in regards to nail lesion. You may stop Viagra and trial Cialis.  We are reaching out to cardiology to schedule a follow-up as you are back in atrial fibrillation.  As discussed if you experience shortness of breath, palpitations, chest pain, you must report immediately to the emergency room

## 2023-03-04 NOTE — Telephone Encounter (Signed)
close

## 2023-03-04 NOTE — Assessment & Plan Note (Signed)
Suboptimal control, stop viagra as ineffective.  Trial of Cialis 2.5mg  prn.

## 2023-03-09 NOTE — Telephone Encounter (Signed)
Have called [facility] on numerous ocassions and have been on hold for 15 mins 20 mins and have not been able to get through to schedule appt or to fax EKG from pt last visit fax number is incorrect. Will try again on tomorrow 03/10/23

## 2023-03-10 NOTE — Telephone Encounter (Signed)
LVM to call back to office  

## 2023-03-10 NOTE — Telephone Encounter (Signed)
Spoke to pt and he does have an appt on Thursday 03/12/23 with [facility] and he would also like to know what the results of his xrays showed if anything. Xrays have not resulted yet

## 2023-03-11 ENCOUNTER — Telehealth: Payer: Self-pay | Admitting: Family

## 2023-03-11 MED ORDER — TADALAFIL 5 MG PO TABS: 2.5000 mg | ORAL_TABLET | Freq: Every day | ORAL | 3 refills | Status: DC | PRN

## 2023-03-11 NOTE — Telephone Encounter (Signed)
LVM to call back.

## 2023-03-11 NOTE — Telephone Encounter (Signed)
Spoke to pt and went over xray results pt verbalized understanding. Pt also asked about referral for someone to look at his toe? And also rx that was suppose to go to [facility]?

## 2023-03-11 NOTE — Telephone Encounter (Signed)
Pt is aware rx sent in to [facility] in [place]

## 2023-03-11 NOTE — Telephone Encounter (Signed)
Call pt  Dr Avelino Leeds from dermatology reviewed picture of lesion on foot .  He felt less likely melanoma however he would need a visit to be evaluated. He didn't feel he needed to be referred to [facility] specialty dermatology  Please ask pt to call to schedule an appointment since he is an established patient  Please let me know if any issues in scheduling

## 2023-03-11 NOTE — Telephone Encounter (Signed)
-----   Message from [name] sent at 03/10/2023  5:29 PM EDT ----- Reviewed photo.  Multiple reasons this is likely benign subungual hematoma.  It appears it is distal and may be growing out.  It is possible that I could be more definitive by examining with Dermoscopy.  We could try to get him in soon if he would like?? It typically would not be necessary for pt to have to travel to [facility] to see Dr Kathyrn Sheriff. Let me know what you would like to do. ----- Message ----- From: Allegra Grana, FNP Sent: 03/04/2023  12:50 PM EDT To: [name], MD  Dr [name],  [facility] you are doing well.  I saw mutual patient of ours this morning.  He had a right great toe nailbed lesion, new, noticed 6 months ago. No known trauma. Picture in chart.   There wasn't a streak, and I couldn't tell if more hemorrhagic as did have a maroon color.   Would if be more appropriate to follow up in your office or should I go ahead and place a referral to [facility] dermatology at [facility]?  In the past for nail disorders/malignancy I have had patient see her.   Of course, if there is someone you prefer/recommend, please let me know.   Claris Che

## 2023-03-18 NOTE — Telephone Encounter (Signed)
Pt is aware.  

## 2023-03-25 NOTE — Telephone Encounter (Signed)
Called and apologized to patient and made patient aware the office is looking into the matter. Patient stated " " I even went back thru my voicemail to see if I could have missed this and there is no message from your office." I apologized and ask if I could contact dermatology for patient  and make the appointment patient stated "no I will  go in person and make appointment" Patient ended call.

## 2023-03-26 ENCOUNTER — Encounter: Payer: Self-pay | Admitting: Nurse Practitioner

## 2023-03-26 ENCOUNTER — Ambulatory Visit: Payer: Commercial Managed Care - PPO | Attending: Nurse Practitioner | Admitting: Nurse Practitioner

## 2023-03-26 VITALS — BP 122/70 | HR 65 | Ht 72.0 in | Wt 192.0 lb

## 2023-03-26 DIAGNOSIS — I10 Essential (primary) hypertension: Secondary | ICD-10-CM

## 2023-03-26 DIAGNOSIS — I4892 Unspecified atrial flutter: Secondary | ICD-10-CM

## 2023-03-26 DIAGNOSIS — I4819 Other persistent atrial fibrillation: Secondary | ICD-10-CM

## 2023-03-26 DIAGNOSIS — E785 Hyperlipidemia, unspecified: Secondary | ICD-10-CM

## 2023-03-26 DIAGNOSIS — I351 Nonrheumatic aortic (valve) insufficiency: Secondary | ICD-10-CM | POA: Diagnosis not present

## 2023-03-26 DIAGNOSIS — F1010 Alcohol abuse, uncomplicated: Secondary | ICD-10-CM

## 2023-03-26 MED ORDER — LOSARTAN POTASSIUM 25 MG PO TABS: 12.5000 mg | ORAL_TABLET | Freq: Every day | ORAL | 3 refills | Status: DC

## 2023-03-26 NOTE — Progress Notes (Signed)
Office Visit    Patient Name: Jake Liu Date of Encounter: 03/26/2023  Primary Care Provider:  Allegra Grana, FNP Primary Cardiologist:  Julien Nordmann, MD  Chief Complaint    62 y.o. male with a history of atrial fibrillation and flutter status post multiple cardioversions, ablation and redo ablation, coronary calcium of 1228 (2020) hypertension, TIA, GERD, sleep apnea, hyperlipidemia, statin intolerance, seronegative rheumatoid arthritis, and alcohol abuse, who presents today for follow-up of recently diagnosed moderate to severe aortic insufficiency.  Past Medical History    Past Medical History:  Diagnosis Date   Actinic keratosis    Anginal pain (HCC)    a. 01/2012 MV: EF 59%, no ischemia/infarct.   Arthritis    Atrial flutter (HCC)    Coronary artery calcification seen on CT scan    a. 08/2018 Cardiac CT: Ca2+ = 1228 (98th%'ile).   Fatty liver    borderline   GERD (gastroesophageal reflux disease)    Headache    History of TIAs    Hypertension    Insomnia    Moderate to severe aortic insufficiency    a. 02/2023 TEE: EF >55%, LVIDd 5.2cm and LVIDs of 2.6cm. No LA/LAA thrombus. LAE. Mild fxnl MR 2/2 dil of LA and AI jet. Mod-sev AI.   Persistent atrial fibrillation (HCC)    a. s/p prior PVI in 2017 w/ touch-up ablation 08/2022; b. multiple DCCVs (most recent 08/2021, 01/2022, 11/2022, 02/2023); c. CHA2DS2VASc = 3-->Xarelto.   Sciatica    Seronegative arthritis 09/27/2019   Sleep apnea    No CPAP   Past Surgical History:  Procedure Laterality Date   ABLATION N/A    for af   COLONOSCOPY     COLONOSCOPY WITH PROPOFOL N/A 10/13/2018   Procedure: COLONOSCOPY WITH PROPOFOL;  Surgeon: Toledo, Boykin Nearing, MD;  Location: [facility] ENDOSCOPY;  Service: Gastroenterology;  Laterality: N/A;   ESOPHAGOGASTRODUODENOSCOPY     ESOPHAGOGASTRODUODENOSCOPY (EGD) WITH PROPOFOL N/A 10/13/2018   Procedure: ESOPHAGOGASTRODUODENOSCOPY (EGD) WITH PROPOFOL;  Surgeon: Toledo, Boykin Nearing, MD;   Location: [facility] ENDOSCOPY;  Service: Gastroenterology;  Laterality: N/A;   HERNIA REPAIR     KNEE SURGERY Left    UMBILICAL HERNIA REPAIR N/A 12/05/2019   Procedure: LAPAROSCOPIC UMBILICAL HERNIA REPAIR;  Surgeon: Ovidio Kin, MD;  Location: [facility];  Service: General;  Laterality: N/A;    Allergies  Allergies  Allergen Reactions   Sulfa Antibiotics Hives    rash    Sulfasalazine Hives   Bactrim [Sulfamethoxazole-Trimethoprim]     Sulfa/Rash   Lisinopril     cough   Zetia [Ezetimibe]     Joint pain    History of Present Illness      62 y.o. y/o male with above past medical history including atrial fibrillation, atrial flutter, coronary calcium of 1228 (2020) hypertension, TIA, GERD, sleep apnea, hyperlipidemia, statin intolerance, seronegative rheumatoid arthritis, and alcohol abuse.  In the setting of persistent atrial fibrillation, he has undergone multiple cardioversions.  In 2017, he underwent A-fib ablation, and has been on sotalol and Xarelto.  He has been followed closely by Dr. Maisie Fus at [facility] and is required multiple cardioversions since his initial ablation.  In early 2024, he had recurrent atrial arrhythmias requiring cardioversion in January.  He then underwent touchup ablation and pulmonary vein isolation in February 2024.  Unfortunately, he had recurrent atrial arrhythmias in May 2024, and August 2024 in the setting of drinking alcohol.  TEE in August 2024 showed normal LV function with moderate to severe  AI and mild functional mitral regurgitation.  Cardioversion was successfully carried out and he was advised to follow-up with general cardiology in the setting of progressive AI.   Since his cardioversion in August, he has completely cut out alcohol.  He also notes that he has been eating more fruits and vegetables and cut out meat other than fish and in that setting, has lost weight.  He recognizes that lifestyle changes will be required to keep him in sinus rhythm.  He  remains active and notes that he recently played golf for 2 days straight, walking roughly 14 miles, without any symptoms or limitations.  He occasionally notes mild chest wall tenderness and soreness, which he says he can make And just by thinking about it.  He does not experience chest pain or dyspnea with activity.  He denies experiencing any palpitations since his most recent cardioversion and denies PND, orthopnea, dizziness, syncope, edema, or early satiety.  Home Medications    Current Outpatient Medications  Medication Sig Dispense Refill   losartan (COZAAR) 25 MG tablet Take 0.5 tablets (12.5 mg total) by mouth daily. 45 tablet 3   rivaroxaban (XARELTO) 20 MG TABS tablet Take 20 mg by mouth at bedtime.      sotalol (BETAPACE) 80 MG tablet Take 80 mg by mouth 2 (two) times daily.     azelastine (ASTELIN) 0.1 % nasal spray Place 1 spray into both nostrils 2 (two) times daily. Use in each nostril as directed (Patient not taking: Reported on 03/26/2023) 30 mL 4   Cholecalciferol 1.25 MG (50000 UT) TABS 50,000 units PO qwk for 8 weeks. (Patient not taking: Reported on 03/26/2023) 8 tablet 0   rosuvastatin (CRESTOR) 40 MG tablet Take 40 mg by mouth daily. Take 1 40 mg tablet once daily (Patient not taking: Reported on 03/26/2023)     tadalafil (CIALIS) 5 MG tablet Take 0.5-1 tablets (2.5-5 mg total) by mouth daily as needed for erectile dysfunction. Max: 20mg /dose up to 1dose/24h (Patient not taking: Reported on 03/26/2023) 10 tablet 3   No current facility-administered medications for this visit.     Review of Systems    Occasional chest wall pain as outlined above.  No recent palpitations.  He denies dyspnea, PND, orthopnea, dizziness, syncope, edema, or early satiety.  All other systems reviewed and are otherwise negative except as noted above.    Physical Exam    VS:  BP 122/70 (BP Location: Left Arm, Patient Position: Sitting, Cuff Size: Normal)   Pulse 65   Ht 6' (1.829 m)   Wt 192 lb  (87.1 kg)   SpO2 98%   BMI 26.04 kg/m  , BMI Body mass index is 26.04 kg/m.     GEN: Well nourished, well developed, in no acute distress. HEENT: normal. Neck: Supple, no JVD, carotid bruits, or masses. Cardiac: RRR, 2/6 diastolic murmur heard along the left sternal border.  No rubs or gallops. No clubbing, cyanosis, edema.  Radials 2+/PT 2+ and equal bilaterally.  Respiratory:  Respirations regular and unlabored, clear to auscultation bilaterally. GI: Soft, nontender, nondistended, BS + x 4. MS: no deformity or atrophy. Skin: warm and dry, no rash. Neuro:  Strength and sensation are intact. Psych: Normal affect.  Accessory Clinical Findings    ECG personally reviewed by me today - EKG Interpretation Date/Time:  Thursday March 26 2023 15:18:20 EDT Ventricular Rate:  65 PR Interval:  198 QRS Duration:  90 QT Interval:  398 QTC Calculation: 413 R Axis:   -  12  Text Interpretation: Normal sinus rhythm Minimal voltage criteria for LVH, may be normal variant ( R in aVL ) Septal infarct , age undetermined Confirmed by Nicolasa Ducking [phone]) on 03/26/2023 5:24:31 PM  - no acute changes.  Lab Results  Component Value Date   WBC 9.2 08/04/2022   HGB 15.6 08/04/2022   HCT 46.2 08/04/2022   MCV 91.2 08/04/2022   PLT 311.0 08/04/2022   Lab Results  Component Value Date   CREATININE 0.74 08/06/2022   BUN 12 08/06/2022   NA 137 08/06/2022   K 4.7 08/06/2022   CL 102 08/06/2022   CO2 26 08/06/2022   Lab Results  Component Value Date   ALT 16 08/04/2022   AST 18 08/04/2022   ALKPHOS 52 08/04/2022   BILITOT 0.8 08/04/2022   Lab Results  Component Value Date   CHOL 153 04/16/2022   HDL 65.50 04/16/2022   LDLCALC 81 04/16/2022   TRIG 36.0 04/16/2022   CHOLHDL 2 04/16/2022    Lab Results  Component Value Date   HGBA1C 5.4 04/16/2022    Assessment & Plan    1.  Moderate to severe aortic insufficiency: Patient recently underwent TEE and cardioversion was noted to  have moderate to severe aortic insufficiency on TEE.  This progressed from moderate AI in July 2023.  Patient is active without exertional angina or dyspnea.  He is euvolemic on examination with normal heart rate and blood pressure and is maintaining sinus rhythm today.  We discussed the diagnosis of aortic insufficiency in detail.  I will add low-dose losartan therapy for afterload reduction and plan to have him follow-up with Dr. Mariah Milling within 3 months and likely follow-up echo in 6 to 12 months.  2.  Persistent atrial fibrillation/flutter: Followed closely by EP at [facility] with multiple cardioversions this year and catheter ablation in February.  He recently quired repeat TEE and cardioversion earlier this month in the setting of alcohol use and since then has done well without recurrent palpitations.  He also says that he is quit alcohol and recognizes that it has been contributing to his symptoms.  He has made other lifestyle and dietary changes and has lost a fair amount of weight intentionally.  He remains on sotalol and Xarelto therapy and reports compliance.  He will continue to follow-up at [facility] for his electrophysiologic needs.  3.  Coronary calcium/chest wall pain: Prior history of coronary calcium score of 1228 in 2020.  He is active without angina or dyspnea.  He does sometimes have chest wall discomfort and tenderness.  We discussed the potential role of stress testing to assess chest discomfort and at this time, he wishes to defer but understands that if he has any change in activity tolerance or development of chest discomfort, we would have a low threshold for ischemic testing and potentially diagnostic catheterization in the setting of valvular heart disease.  Plan for follow-up lipids with his annual physical coming up in a few months.  4.  Primary hypertension: Stable.  Not currently using antihypertensives.  5.  Hyperlipidemia/statin intolerance: LDL of 81 last year at which time he was  taking rosuvastatin but he has since come off secondary to myalgias.  He has made significant lifestyle modifications and has lost some weight.  He is interested in seeing what his fasting lipids are with his annual primary care exam in a few months and we agreed to hold off on adding additional agent until fasting lipids are repeated though I  suspect he will require either an alternate statin, bempedoic acid, or a PCSK9 inhibitor.  He did not previously tolerate Zetia.  6.  Alcohol abuse: Patient says he has quit drinking.  I congratulated him on this and encouraged him to remain off of alcohol.  7.  Disposition: Follow-up in clinic in 3 months or sooner if necessary.   Nicolasa Ducking, NP 03/26/2023, 5:35 PM

## 2023-03-26 NOTE — Patient Instructions (Signed)
Medication Instructions:  START Losartan 12.5 mg daily   *If you need a refill on your cardiac medications before your next appointment, please call your pharmacy*    Follow-Up: At [facility], you and your health needs are our priority.  As part of our continuing mission to provide you with exceptional heart care, we have created designated Provider Care Teams.  These Care Teams include your primary Cardiologist (physician) and Advanced Practice Providers (APPs -  Physician Assistants and Nurse Practitioners) who all work together to provide you with the care you need, when you need it.  We recommend signing up for the patient portal called "MyChart".  Sign up information is provided on this After Visit Summary.  MyChart is used to connect with patients for Virtual Visits (Telemedicine).  Patients are able to view lab/test results, encounter notes, upcoming appointments, etc.  Non-urgent messages can be sent to your provider as well.   To learn more about what you can do with MyChart, go to [email or web address].    Your next appointment:   3 month(s)  Provider:   Julien Nordmann, MD

## 2023-04-14 DIAGNOSIS — I484 Atypical atrial flutter: Secondary | ICD-10-CM | POA: Insufficient documentation

## 2023-04-14 DIAGNOSIS — I351 Nonrheumatic aortic (valve) insufficiency: Secondary | ICD-10-CM | POA: Insufficient documentation

## 2023-04-15 ENCOUNTER — Ambulatory Visit: Payer: Commercial Managed Care - PPO | Admitting: Dermatology

## 2023-04-15 ENCOUNTER — Encounter: Payer: Self-pay | Admitting: Dermatology

## 2023-04-15 DIAGNOSIS — L814 Other melanin hyperpigmentation: Secondary | ICD-10-CM

## 2023-04-15 DIAGNOSIS — S21402A Unspecified open wound of left back wall of thorax with penetration into thoracic cavity, initial encounter: Secondary | ICD-10-CM

## 2023-04-15 DIAGNOSIS — S90211A Contusion of right great toe with damage to nail, initial encounter: Secondary | ICD-10-CM | POA: Diagnosis not present

## 2023-04-15 DIAGNOSIS — W908XXA Exposure to other nonionizing radiation, initial encounter: Secondary | ICD-10-CM

## 2023-04-15 DIAGNOSIS — B351 Tinea unguium: Secondary | ICD-10-CM

## 2023-04-15 DIAGNOSIS — T148XXA Other injury of unspecified body region, initial encounter: Secondary | ICD-10-CM

## 2023-04-15 DIAGNOSIS — S90219A Contusion of unspecified great toe with damage to nail, initial encounter: Secondary | ICD-10-CM

## 2023-04-15 DIAGNOSIS — L570 Actinic keratosis: Secondary | ICD-10-CM

## 2023-04-15 NOTE — Patient Instructions (Signed)
Cryotherapy Aftercare  Wash gently with soap and water everyday.   Apply Vaseline and Band-Aid daily until healed.   Melanoma ABCDEs  Melanoma is the most dangerous type of skin cancer, and is the leading cause of death from skin disease.  You are more likely to develop melanoma if you: Have light-colored skin, light-colored eyes, or red or blond hair Spend a lot of time in the sun Tan regularly, either outdoors or in a tanning bed Have had blistering sunburns, especially during childhood Have a close family member who has had a melanoma Have atypical moles or large birthmarks  Early detection of melanoma is key since treatment is typically straightforward and cure rates are extremely high if we catch it early.   The first sign of melanoma is often a change in a mole or a new dark spot.  The ABCDE system is a way of remembering the signs of melanoma.  A for asymmetry:  The two halves do not match. B for border:  The edges of the growth are irregular. C for color:  A mixture of colors are present instead of an even brown color. D for diameter:  Melanomas are usually (but not always) greater than 6mm - the size of a pencil eraser. E for evolution:  The spot keeps changing in size, shape, and color.  Please check your skin once per month between visits. You can use a small mirror in front and a large mirror behind you to keep an eye on the back side or your body.   If you see any new or changing lesions before your next follow-up, please call to schedule a visit.  Please continue daily skin protection including broad spectrum sunscreen SPF 30+ to sun-exposed areas, reapplying every 2 hours as needed when you're outdoors.    Due to recent changes in healthcare laws, you may see results of your pathology and/or laboratory studies on MyChart before the doctors have had a chance to review them. We understand that in some cases there may be results that are confusing or concerning to you.  Please understand that not all results are received at the same time and often the doctors may need to interpret multiple results in order to provide you with the best plan of care or course of treatment. Therefore, we ask that you please give [place] 2 business days to thoroughly review all your results before contacting the office for clarification. Should we see a critical lab result, you will be contacted sooner.   If You Need Anything After Your Visit  If you have any questions or concerns for your doctor, please call our main line at [phone] and press option 4 to reach your doctor's medical assistant. If no one answers, please leave a voicemail as directed and we will return your call as soon as possible. Messages left after 4 pm will be answered the following business day.   You may also send [place] a message via MyChart. We typically respond to MyChart messages within 1-2 business days.  For prescription refills, please ask your pharmacy to contact our office. Our fax number is [phone].  If you have an urgent issue when the clinic is closed that cannot wait until the next business day, you can page your doctor at the number below.    Please note that while we do our best to be available for urgent issues outside of office hours, we are not available 24/7.   If you have an urgent issue  and are unable to reach [place], you may choose to seek medical care at your doctor's office, retail clinic, urgent care center, or emergency room.  If you have a medical emergency, please immediately call 911 or go to the emergency department.  Pager Numbers  - Dr. [name]: [phone]  - Dr. Roseanne Reno: [phone]  - Dr. Katrinka Blazing: [phone]   In the event of inclement weather, please call our main line at [phone] for an update on the status of any delays or closures.  Dermatology Medication Tips: Please keep the boxes that topical medications come in in order to help keep track of the  instructions about where and how to use these. Pharmacies typically print the medication instructions only on the boxes and not directly on the medication tubes.   If your medication is too expensive, please contact our office at [phone] option 4 or send [place] a message through MyChart.   We are unable to tell what your co-pay for medications will be in advance as this is different depending on your insurance coverage. However, we may be able to find a substitute medication at lower cost or fill out paperwork to get insurance to cover a needed medication.   If a prior authorization is required to get your medication covered by your insurance company, please allow [place] 1-2 business days to complete this process.  Drug prices often vary depending on where the prescription is filled and some pharmacies may offer cheaper prices.  The website www.goodrx.com contains coupons for medications through different pharmacies. The prices here do not account for what the cost may be with help from insurance (it may be cheaper with your insurance), but the website can give you the price if you did not use any insurance.  - You can print the associated coupon and take it with your prescription to the pharmacy.  - You may also stop by our office during regular business hours and pick up a GoodRx coupon card.  - If you need your prescription sent electronically to a different pharmacy, notify our office through [facility] or by phone at [phone] option 4.

## 2023-04-15 NOTE — Progress Notes (Signed)
   Follow-Up Visit   Subjective  Jake Liu is a 62 y.o. male who presents for the following: spot at left shoulder that bleeds once in a while. Also with a spot at right great toenail, present for about a year, patient unsure if trauma is a possibility.   The patient has spots, moles and lesions to be evaluated, some may be new or changing and the patient may have concern these could be cancer.   The following portions of the chart were reviewed this encounter and updated as appropriate: medications, allergies, medical history  Review of Systems:  No other skin or systemic complaints except as noted in HPI or Assessment and Plan.  Objective  Well appearing patient in no apparent distress; mood and affect are within normal limits.   A focused examination was performed of the following areas: Shoulders, back, face, right foot and toenails  Relevant exam findings are noted in the Assessment and Plan.  Left Upper Back Excoriated papule, no evidence of skin cancer  right zygoma Erythematous macule with scar-like appearance (lesion is marked)        Assessment & Plan   Subungual hematoma at right great toenail, background of onychomycosis Exam: visualized dried hemorrhage on underside of R first toenail plate. No lesion on underlying nail bed Discoloration, onychauxis, subungual debris of several toenails  Plan: Observe. Will take 6+ months for hemorrhage to fully grow out   Open wound Left Upper Back  Vaseline daily until healed. Avoid picking  AK (actinic keratosis) right zygoma  TOOK PHOTO FOR DR [name] TO LOCATE LESION AND POTENTIALLY BIOPSY IF NOT RESOLVED  Actinic keratoses are precancerous spots that appear secondary to cumulative UV radiation exposure/sun exposure over time. They are chronic with expected duration over 1 year. A portion of actinic keratoses will progress to squamous cell carcinoma of the skin. It is not possible to reliably predict which  spots will progress to skin cancer and so treatment is recommended to prevent development of skin cancer.  Recommend daily broad spectrum sunscreen SPF 30+ to sun-exposed areas, reapply every 2 hours as needed.  Recommend staying in the shade or wearing long sleeves, sun glasses (UVA+UVB protection) and wide brim hats (4-inch brim around the entire circumference of the hat). Call for new or changing lesions.   Destruction of lesion - right zygoma  Destruction method: cryotherapy   Informed consent: discussed and consent obtained   Lesion destroyed using liquid nitrogen: Yes   Cryotherapy cycles:  2 Outcome: patient tolerated procedure well with no complications   Post-procedure details: wound care instructions given     LENTIGINES Exam: tan macule at left posterior helix with focal areas of increased pigmentation Due to sun exposure Treatment Plan: Suspect lentigo. Ddx lentigo maligna. TOOK PHOTO FOR DR [name] TO RECHECK AT FOLLOW UP    Return for TBSE, as scheduled, with Dr. Kirtland Bouchard, AK follow up, recheck lentigo at left post helix.  Anise Salvo, RMA, am acting as scribe for Elie Goody, MD .   Documentation: I have reviewed the above documentation for accuracy and completeness, and I agree with the above.  Elie Goody, MD

## 2023-04-17 ENCOUNTER — Telehealth: Payer: Self-pay | Admitting: *Deleted

## 2023-04-17 NOTE — Telephone Encounter (Signed)
Called patient to remind him that fasting labs were due. He may come to the office or any [facility].

## 2023-06-08 ENCOUNTER — Encounter: Payer: Self-pay | Admitting: Family

## 2023-06-08 ENCOUNTER — Ambulatory Visit: Payer: Commercial Managed Care - PPO | Admitting: Family

## 2023-06-08 VITALS — BP 118/70 | HR 80 | Temp 98.7°F | Ht 72.0 in | Wt 194.0 lb

## 2023-06-08 DIAGNOSIS — Z125 Encounter for screening for malignant neoplasm of prostate: Secondary | ICD-10-CM | POA: Diagnosis not present

## 2023-06-08 DIAGNOSIS — I700 Atherosclerosis of aorta: Secondary | ICD-10-CM

## 2023-06-08 DIAGNOSIS — I10 Essential (primary) hypertension: Secondary | ICD-10-CM | POA: Diagnosis not present

## 2023-06-08 DIAGNOSIS — I4891 Unspecified atrial fibrillation: Secondary | ICD-10-CM

## 2023-06-08 DIAGNOSIS — E782 Mixed hyperlipidemia: Secondary | ICD-10-CM | POA: Diagnosis not present

## 2023-06-08 DIAGNOSIS — Z8639 Personal history of other endocrine, nutritional and metabolic disease: Secondary | ICD-10-CM

## 2023-06-08 DIAGNOSIS — Z1211 Encounter for screening for malignant neoplasm of colon: Secondary | ICD-10-CM

## 2023-06-08 LAB — CBC WITH DIFFERENTIAL/PLATELET
Basophils Absolute: 0.1 10*3/uL (ref 0.0–0.1)
Basophils Relative: 1.2 % (ref 0.0–3.0)
Eosinophils Absolute: 0.3 10*3/uL (ref 0.0–0.7)
Eosinophils Relative: 4.1 % (ref 0.0–5.0)
HCT: 41.9 % (ref 39.0–52.0)
Hemoglobin: 14.1 g/dL (ref 13.0–17.0)
Lymphocytes Relative: 30 % (ref 12.0–46.0)
Lymphs Abs: 1.8 10*3/uL (ref 0.7–4.0)
MCHC: 33.6 g/dL (ref 30.0–36.0)
MCV: 94.7 fL (ref 78.0–100.0)
Monocytes Absolute: 0.6 10*3/uL (ref 0.1–1.0)
Monocytes Relative: 10.6 % (ref 3.0–12.0)
Neutro Abs: 3.3 10*3/uL (ref 1.4–7.7)
Neutrophils Relative %: 54.1 % (ref 43.0–77.0)
Platelets: 257 10*3/uL (ref 150.0–400.0)
RBC: 4.42 Mil/uL (ref 4.22–5.81)
RDW: 14.5 % (ref 11.5–15.5)
WBC: 6.1 10*3/uL (ref 4.0–10.5)

## 2023-06-08 LAB — COMPREHENSIVE METABOLIC PANEL
ALT: 15 U/L (ref 0–53)
AST: 18 U/L (ref 0–37)
Albumin: 4.1 g/dL (ref 3.5–5.2)
Alkaline Phosphatase: 45 U/L (ref 39–117)
BUN: 18 mg/dL (ref 6–23)
CO2: 29 meq/L (ref 19–32)
Calcium: 9.3 mg/dL (ref 8.4–10.5)
Chloride: 101 meq/L (ref 96–112)
Creatinine, Ser: 0.68 mg/dL (ref 0.40–1.50)
GFR: 99.16 mL/min (ref >60.00)
Glucose, Bld: 101 mg/dL — ABNORMAL HIGH (ref 70–99)
Potassium: 4.7 meq/L (ref 3.5–5.1)
Sodium: 136 meq/L (ref 135–145)
Total Bilirubin: 0.5 mg/dL (ref 0.2–1.2)
Total Protein: 7.1 g/dL (ref 6.0–8.3)

## 2023-06-08 LAB — LIPID PANEL
Cholesterol: 188 mg/dL (ref 0–200)
HDL: 66 mg/dL (ref >39.00)
LDL Cholesterol: 111 mg/dL — ABNORMAL HIGH (ref 0–99)
NonHDL: 122.44
Total CHOL/HDL Ratio: 3
Triglycerides: 56 mg/dL (ref 0.0–149.0)
VLDL: 11.2 mg/dL (ref 0.0–40.0)

## 2023-06-08 LAB — TSH: TSH: 1.54 u[IU]/mL (ref 0.35–5.50)

## 2023-06-08 LAB — HEMOGLOBIN A1C: Hgb A1c MFr Bld: 5.4 % (ref 4.6–6.5)

## 2023-06-08 LAB — PSA: PSA: 0.54 ng/mL (ref 0.10–4.00)

## 2023-06-08 LAB — VITAMIN D 25 HYDROXY (VIT D DEFICIENCY, FRACTURES): VITD: 22.09 ng/mL — ABNORMAL LOW (ref 30.00–100.00)

## 2023-06-08 MED ORDER — ROSUVASTATIN CALCIUM 40 MG PO TABS: 40.0000 mg | ORAL_TABLET | Freq: Every day | ORAL | 3 refills | Status: DC

## 2023-06-08 NOTE — Progress Notes (Signed)
Assessment & Plan:  Primary hypertension -     Comprehensive metabolic panel -     CBC with Differential/Platelet -     TSH  Aortic atherosclerosis (HCC) Assessment & Plan: Reiterated the importance of staying compliant with statin therapy in the setting of aortic atherosclerosis.  Patient also has a family history of ASCVD.  Patient would like to restart Crestor.  Pending lipid panel today, and plan to repeat lipid panel and hepatic function in 6 weeks  Orders: -     Lipid panel -     Hepatic function panel; Future -     Lipid panel; Future  Mixed hyperlipidemia -     Hemoglobin A1c -     Lipid panel -     Rosuvastatin Calcium; Take 1 tablet (40 mg total) by mouth daily.  Dispense: 90 tablet; Refill: 3 -     Hepatic function panel; Future -     Lipid panel; Future  Screening for prostate cancer -     PSA  History of vitamin D deficiency -     VITAMIN D 25 Hydroxy (Vit-D Deficiency, Fractures)  Screen for colon cancer -     Ambulatory referral to Gastroenterology  Atrial fibrillation status post cardioversion [facility]) Assessment & Plan: Chronic, symptomatically stable stable.  He reports drinking less alcohol. Continue Xarelto, metoprolol as managed by EP, Dr. Maisie Fus.  Will follow.       Return precautions given.   Risks, benefits, and alternatives of the medications and treatment plan prescribed today were discussed, and patient expressed understanding.   Education regarding symptom management and diagnosis given to patient on AVS either electronically or printed.  Return in about 4 months (around 10/06/2023) for Complete Physical Exam.  Rennie Plowman, FNP  Subjective:    Patient ID: Jake Liu, Jake Liu    DOB: 07-12-1961, 62 y.o.   MRN: [id number]  CC: Jake Liu is a 62 y.o. Jake Liu who presents today for follow up.   HPI: He feels 'great' today. He has lost 45lbs intentionally,  Eating more fish and less red meat.  He is drinking less alcohol, particularly  red wine.      He is no longer on sotalol.  He is compliant with xarelto, metoprolol.  Following with [facility] and [facility].  S/o cardioversion Dr Maisie Fus 03/12/23  Recent consult 06/01/23 with [facility] cardiology Dr Winona Legato regarding AVR. Discussed surgery  He is not taking crestor 40mg  every other day.  She denies urinary hesitancy or decreased urine stream  Allergies: Sulfa antibiotics, Sulfasalazine, Bactrim [sulfamethoxazole-trimethoprim], Lisinopril, and Zetia [ezetimibe] Current Outpatient Medications on File Prior to Visit  Medication Sig Dispense Refill   azelastine (ASTELIN) 0.1 % nasal spray Place 1 spray into both nostrils 2 (two) times daily. Use in each nostril as directed 30 mL 4   METOPROLOL SUCCINATE PO Take by mouth. Pt takes 80 mg 2x's daily     rivaroxaban (XARELTO) 20 MG TABS tablet Take 20 mg by mouth at bedtime.      No current facility-administered medications on file prior to visit.    Review of Systems  Constitutional:  Negative for chills and fever.  Respiratory:  Negative for cough.   Cardiovascular:  Negative for chest pain and palpitations.  Gastrointestinal:  Negative for nausea and vomiting.      Objective:    BP 118/70   Pulse 80   Temp 98.7 F (37.1 C) (Oral)   Ht 6' (1.829 m)   Wt 194  lb (88 kg)   SpO2 98%   BMI 26.31 kg/m  BP Readings from Last 3 Encounters:  06/08/23 118/70  03/26/23 122/70  03/04/23 120/78   Wt Readings from Last 3 Encounters:  06/08/23 194 lb (88 kg)  03/26/23 192 lb (87.1 kg)  03/04/23 196 lb (88.9 kg)    Physical Exam Vitals reviewed.  Constitutional:      Appearance: He is well-developed.  Cardiovascular:     Rate and Rhythm: Regular rhythm.     Heart sounds: Normal heart sounds.  Pulmonary:     Effort: Pulmonary effort is normal. No respiratory distress.     Breath sounds: Normal breath sounds. No wheezing, rhonchi or rales.  Skin:    General: Skin is warm and dry.  Neurological:     Mental  Status: He is alert.  Psychiatric:        Speech: Speech normal.        Behavior: Behavior normal.

## 2023-06-08 NOTE — Assessment & Plan Note (Addendum)
Reiterated the importance of staying compliant with statin therapy in the setting of aortic atherosclerosis.  Patient also has a family history of ASCVD.  Patient would like to restart Crestor.  Pending lipid panel today, and plan to repeat lipid panel and hepatic function in 6 weeks

## 2023-06-08 NOTE — Patient Instructions (Signed)
Please resume crestor 40mg  daily. Monitor for muscle aches.    Nice to see you!

## 2023-06-08 NOTE — Assessment & Plan Note (Signed)
Chronic, symptomatically stable stable.  He reports drinking less alcohol. Continue Xarelto, metoprolol as managed by EP, Dr. Maisie Fus.  Will follow.

## 2023-06-28 NOTE — Progress Notes (Unsigned)
Cardiology Office Note  Date:  06/28/2023   ID:  Jake Liu, DOB 06-Sep-1960, MRN [id number]  PCP:  Jake Grana, FNP   No chief complaint on file.   HPI:  [name] is a 62 year old gentleman with past medical history of Atrial fibrillation, cardioversion June 2013, repeat July 2014, Normal ejection fraction on transesophageal echo July 2014 History of ablation, 2017 Smokes cigars Hypertension TIA Stress at work/anxiety, drinks wine Obstructive sleep apnea Coronary calcium score of 1228. In 2020 seronegative RA followed by rheumatology. Alcohol Numerous cardioversions at [facility] July 2023, January 2024, May 2024, August 2024 Aortic root dilation 4.3 cm Who presents for follow-up of his hypertension, atrial fibrillation, moderate aortic valve regurgitation  Last seen in clinic February 2023 Followed by EP at [facility] Mitral isthmus flutter ablation 09/03/2022 unable to obtain isthmus block with posterior and anterior MI line, sotalol reinitiated   May 2020 for recurrent paroxysmal palpitations in the setting of alcohol Underwent TEE cardioversion August 2024 Left atrial chamber dilation, severe reported On sotalol 80 twice daily    Reported 3 to 4 glasses of red wine every night for dinner, more with dinner trips taking sotalol 120 mg daily in the p.m., none in the a.m.  Heavy drinking night last night, after dinner box of hard alcohol and glasses of wine Today in atrial fibrillation today rate 120 Sweating this Am, still feels mildly diaphoretic otherwise reports feeling okay Had 3 cups coffee this morning, no sotalol this morning, does not check his heart rate  Last week with PMD, rate 87  Quit statin on his own Was represcribed by primary care last week, still not started it wonders whether he needs to Total cholesterol 250  Reports he is followed by Dr. Maisie Fus, EP at [facility] " Dr. Maisie Fus told me to take sotalol twice a day"  EKG personally reviewed by  myself on todays visit Atrial fibrillation rate 120 bpm   Past medical history reviewed seen in the office by Gillian Shields 09/07/2020 : increasing fatigue.  EKG showed atrial fibrillation with a rate of 98.  self-decreased his sotalol 120 mg from the prescribed twice a day to once a day about 5 months ago because he felt very tired on twice a day dosing.  advised him to increase to twice daily dosing.  starting taking sotalol 1/2 tablet (60mg ) sotalol in the morning and 120mg  in the evening.  wants to avoid higher-risk AADs but doesn't feel well on sotalol twice daily. He prefers to avoid repeat ablation if possible, but wishes to proceed with DCCV.  Procedure 09/17/20, cardioversion No ablation recently   hospitalization May 2021 Had hernia repair Before getting into the hospital had gone into atrial fibrillation presumably secondary to the pain Placed on Cardizem infusion, metoprolol increased  In hospital [facility] 4 days: cardioversion metoprolol to sotalol On xarelto  Seen by [facility] cardiology as outpatient last month, treadmill stress echo ordered, has not been completed yet  Does not know what medications he is taking, unclear if he is on Zetia or Crestor  Past medical history reviewed Echo 09/2017, outside facility Normal LV function, no significant valvular disease Dilated aortic root, 3.9 cm  Carotid Doppler, normal 2014  2013,  Myoview showing no ischemia   PMH:   has a past medical history of Actinic keratosis, Anginal pain (HCC), Arthritis, Atrial flutter (HCC), Coronary artery calcification seen on CT scan, Fatty liver, GERD (gastroesophageal reflux disease), Headache, History of TIAs, Hypertension, Insomnia, Moderate to severe aortic  insufficiency, Persistent atrial fibrillation (HCC), Sciatica, Seronegative arthritis (09/27/2019), and Sleep apnea.  PSH:    Past Surgical History:  Procedure Laterality Date   ABLATION N/A    for af   COLONOSCOPY     COLONOSCOPY  WITH PROPOFOL N/A 10/13/2018   Procedure: COLONOSCOPY WITH PROPOFOL;  Surgeon: Toledo, Boykin Nearing, MD;  Location: [facility] ENDOSCOPY;  Service: Gastroenterology;  Laterality: N/A;   ESOPHAGOGASTRODUODENOSCOPY     ESOPHAGOGASTRODUODENOSCOPY (EGD) WITH PROPOFOL N/A 10/13/2018   Procedure: ESOPHAGOGASTRODUODENOSCOPY (EGD) WITH PROPOFOL;  Surgeon: Toledo, Boykin Nearing, MD;  Location: [facility] ENDOSCOPY;  Service: Gastroenterology;  Laterality: N/A;   HERNIA REPAIR     KNEE SURGERY Left    UMBILICAL HERNIA REPAIR N/A 12/05/2019   Procedure: LAPAROSCOPIC UMBILICAL HERNIA REPAIR;  Surgeon: Ovidio Kin, MD;  Location: [facility] ORS;  Service: General;  Laterality: N/A;    Current Outpatient Medications  Medication Sig Dispense Refill   azelastine (ASTELIN) 0.1 % nasal spray Place 1 spray into both nostrils 2 (two) times daily. Use in each nostril as directed 30 mL 4   METOPROLOL SUCCINATE PO Take by mouth. Pt takes 80 mg 2x's daily     rivaroxaban (XARELTO) 20 MG TABS tablet Take 20 mg by mouth at bedtime.      rosuvastatin (CRESTOR) 40 MG tablet Take 1 tablet (40 mg total) by mouth daily. 90 tablet 3   No current facility-administered medications for this visit.    Allergies:   Sulfa antibiotics, Sulfasalazine, Bactrim [sulfamethoxazole-trimethoprim], Lisinopril, and Zetia [ezetimibe]   Social History:  The patient  reports that he has quit smoking. He has never used smokeless tobacco. He reports current alcohol use of about 14.0 standard drinks of alcohol per week. He reports that he does not use drugs.   Family History:   family history includes Asthma in his father; COPD in his mother; Heart disease (age of onset: 53) in his mother.    Review of Systems: Review of Systems  Constitutional:  Positive for diaphoresis.  Respiratory: Negative.    Cardiovascular: Negative.   Gastrointestinal: Negative.   Musculoskeletal: Negative.   Neurological: Negative.   Psychiatric/Behavioral: Negative.    All other  systems reviewed and are negative.    PHYSICAL EXAM: VS:  There were no vitals taken for this visit. , BMI There is no height or weight on file to calculate BMI. Constitutional:  oriented to person, place, and time. No distress.  Feels clammy HENT:  Head: Grossly normal Eyes:  no discharge. No scleral icterus.  Neck: No JVD, no carotid bruits  Cardiovascular: Irregularly irregular, rapid, no murmurs appreciated Pulmonary/Chest: Clear to auscultation bilaterally, no wheezes or rails Abdominal: Soft.  no distension.  no tenderness.  Musculoskeletal: Normal range of motion Neurological:  normal muscle tone. Coordination normal. No atrophy Skin: Skin warm and dry Psychiatric: normal affect, pleasant   Recent Labs: 08/04/2022: Magnesium 2.0 06/08/2023: ALT 15; BUN 18; Creatinine, Ser 0.68; Hemoglobin 14.1; Platelets 257.0; Potassium 4.7; Sodium 136; TSH 1.54   Lipid Panel Lab Results  Component Value Date   CHOL 188 06/08/2023   HDL 66.00 06/08/2023   LDLCALC 111 (H) 06/08/2023   TRIG 56.0 06/08/2023      Wt Readings from Last 3 Encounters:  06/08/23 194 lb (88 kg)  03/26/23 192 lb (87.1 kg)  03/04/23 196 lb (88.9 kg)      ASSESSMENT AND PLAN:  Atrial fibrillation status post cardioversion (HCC) Only taking sotalol in the p.m., was prescribed twice a day by EP  In atrial fibrillation today Heavy drinking last night Recommend he take sotalol this morning and diltiazem 60 mg at noon and if rate remains above 110 Stressed the importance of taking sotalol twice a day, Xarelto daily Long discussion concerning risk factors for atrial fibrillation which include his drinking and weight Recommend if he does not convert to normal sinus rhythm early next week that he call our office or Dr. Maisie Fus  Coronary disease with stable angina Heavy coronary calcification, calcium scoring performed February 2020 Denies anginal symptoms Previously ordered stress test, not completed Recommend  he get back on his Crestor Continues to take himself off the medication  Essential hypertension Increase sotalol up to twice a day  Obstructive sleep apnea Weight high, lots of drinking Risks discussed with him  GAD (generalized anxiety disorder) Recommend cut back on his alcohol  Mixed hyperlipidemia Stay on Crestor   Total encounter time more than 40 minutes  Greater than 50% was spent in counseling and coordination of care with the patient   No orders of the defined types were placed in this encounter.    Signed, Dossie Arbour, M.D., Ph.D. 06/28/2023  [facility] [place], [place] [phone]

## 2023-06-29 ENCOUNTER — Ambulatory Visit: Payer: Commercial Managed Care - PPO | Admitting: Cardiovascular Disease

## 2023-06-29 ENCOUNTER — Telehealth: Payer: Self-pay

## 2023-06-29 DIAGNOSIS — I10 Essential (primary) hypertension: Secondary | ICD-10-CM

## 2023-06-29 DIAGNOSIS — I351 Nonrheumatic aortic (valve) insufficiency: Secondary | ICD-10-CM

## 2023-06-29 DIAGNOSIS — F1010 Alcohol abuse, uncomplicated: Secondary | ICD-10-CM

## 2023-06-29 DIAGNOSIS — I4819 Other persistent atrial fibrillation: Secondary | ICD-10-CM

## 2023-06-29 DIAGNOSIS — I700 Atherosclerosis of aorta: Secondary | ICD-10-CM

## 2023-06-29 DIAGNOSIS — I2510 Atherosclerotic heart disease of native coronary artery without angina pectoris: Secondary | ICD-10-CM

## 2023-06-29 DIAGNOSIS — E785 Hyperlipidemia, unspecified: Secondary | ICD-10-CM

## 2023-06-29 DIAGNOSIS — I4892 Unspecified atrial flutter: Secondary | ICD-10-CM

## 2023-06-29 NOTE — Telephone Encounter (Signed)
Patient states he would like to have his colonoscopy as soon as possible because his schedule gets busy after January.  Patient states we have referred him to a gastroenterologist for his colonoscopy, but he has not heard back from them.  Patient states he is also experiencing an increased frequency in urination and he would like to have his prostate checked.  I scheduled an appointment for patient to see Rennie Plowman, FNP.

## 2023-06-30 NOTE — Telephone Encounter (Signed)
Call pt  Please advise that referral has been placed to [facility] gastroenterology, Dr. Mia Creek.  He may call their office to ask to schedule colonoscopy.  I am not sure what their lead time is however I am not sure that it would be done this month.  Usually it takes several weeks to schedule   Please inform patient that PSA was checked 06/08/2023 and it was normal 0.54.   If he is concern for new urinary symptoms, please ask him to schedule an in person visit with me

## 2023-07-01 NOTE — Telephone Encounter (Signed)
Spoke to pt went over note below in detail pt verbalized understanding

## 2023-07-06 ENCOUNTER — Ambulatory Visit: Payer: Commercial Managed Care - PPO | Admitting: Family

## 2023-07-13 NOTE — Progress Notes (Signed)
[facility] [address] [place] [address] Phone: [phone]   Assessment and Plan:     1. Chronic pain of left knee 2. Contusion of left knee, initial encounter  -Chronic with exacerbation, subsequent visit - Acute flare of chronic knee pain occurring after fall onto left knee on 07/10/2023.  Most consistent with contusion of lateral tibial tuberosity - X-ray obtained in clinic.  My interpretation: No acute fracture or dislocation.  Similar degenerative changes to x-ray from February 2024 - Start Tylenol 500 to 1000 mg tablets 2-3 times a day for day-to-day pain relief -Start HEP to prevent weakness or stiffness - Recommend using heat to decrease muscle tension - May continue physical activity as tolerated -Do not recommend prednisone or NSAIDs p.o. due to past medical history atrial fibrillation on chronic anticoagulation with Xarelto  15 additional minutes spent for educating Therapeutic Home Exercise Program.  This included exercises focusing on stretching, strengthening, with focus on eccentric aspects.   Long term goals include an improvement in range of motion, strength, endurance as well as avoiding reinjury. Patient's frequency would include in 1-2 times a day, 3-5 times a week for a duration of 6-12 weeks. Proper technique shown and discussed handout in great detail with ATC.  All questions were discussed and answered.    Pertinent previous records reviewed include x-ray left knee February 2024  Follow Up: 4 weeks for reevaluation.  If no improvement or worsening symptoms, could consider physical therapy versus advanced imaging to evaluate for possible meniscal pathology   Subjective:   I, Jake Liu, am serving as a Neurosurgeon for Doctor Richardean Sale  Chief Complaint: left knee pain   HPI:   07/14/2023 Patient is a 62 year old male with left knee pain. Patient states that he slipped and fell Friday. He  states he had knee swelling. Decreased ROM due to pain. He is improving but still has some pain. Tylenol for the pain and that helps. Ice helps. Pain radiates down to is calf but that's where he hit. He does endorse swelling in his ankle    Relevant Historical Information: History of atrial fibrillation status post cardioversion, on chronic anticoagulation with Xarelto, GERD  Additional pertinent review of systems negative.   Current Outpatient Medications:    azelastine (ASTELIN) 0.1 % nasal spray, Place 1 spray into both nostrils 2 (two) times daily. Use in each nostril as directed, Disp: 30 mL, Rfl: 4   METOPROLOL SUCCINATE PO, Take by mouth. Pt takes 80 mg 2x's daily, Disp: , Rfl:    rivaroxaban (XARELTO) 20 MG TABS tablet, Take 20 mg by mouth at bedtime. , Disp: , Rfl:    rosuvastatin (CRESTOR) 40 MG tablet, Take 1 tablet (40 mg total) by mouth daily., Disp: 90 tablet, Rfl: 3   Objective:     Vitals:   07/14/23 1509  BP: 132/80  Pulse: 74  SpO2: 100%  Weight: 194 lb (88 kg)  Height: 6' (1.829 m)      Body mass index is 26.31 kg/m.    Physical Exam:    General:  awake, alert oriented, no acute distress nontoxic Skin: no suspicious lesions or rashes Neuro:sensation intact and strength 5/5 with no deficits, no atrophy, normal muscle tone Psych: No signs of anxiety, depression or other mood disorder  Left knee: Mild swelling No deformity Positive fluid wave, joint milking ROM Flex 95, Ext 15 TTP tibial tuberosity NTTP over the quad tendon, medial fem  condyle, lat fem condyle, patella, plica, patella tendon, , fibular head, posterior fossa, pes anserine bursa, gerdy's tubercle, medial jt line, lateral jt line Neg anterior and posterior drawer Neg lachman Neg sag sign Negative varus stress Negative valgus stress Negative McMurray  Gait normal    Electronically signed by:  [facility] 3:38 PM 07/14/23

## 2023-07-14 ENCOUNTER — Ambulatory Visit: Payer: Commercial Managed Care - PPO | Admitting: Sports Medicine

## 2023-07-14 ENCOUNTER — Ambulatory Visit (INDEPENDENT_AMBULATORY_CARE_PROVIDER_SITE_OTHER): Payer: Commercial Managed Care - PPO

## 2023-07-14 VITALS — BP 132/80 | HR 74 | Ht 72.0 in | Wt 194.0 lb

## 2023-07-14 DIAGNOSIS — M25562 Pain in left knee: Secondary | ICD-10-CM

## 2023-07-14 DIAGNOSIS — G8929 Other chronic pain: Secondary | ICD-10-CM | POA: Diagnosis not present

## 2023-07-14 DIAGNOSIS — S8002XA Contusion of left knee, initial encounter: Secondary | ICD-10-CM

## 2023-07-14 NOTE — Patient Instructions (Signed)
Knee HEP  Tylenol [phone] mg 2-3 times a day for pain relief  Heating pads , elevation, and activity as tolerated

## 2023-08-06 ENCOUNTER — Telehealth: Payer: Self-pay

## 2023-08-06 ENCOUNTER — Encounter: Payer: Self-pay | Admitting: Family

## 2023-08-06 ENCOUNTER — Ambulatory Visit (INDEPENDENT_AMBULATORY_CARE_PROVIDER_SITE_OTHER): Payer: Commercial Managed Care - PPO | Admitting: Family

## 2023-08-06 VITALS — BP 130/76 | HR 70 | Temp 97.7°F | Ht 72.0 in | Wt 195.6 lb

## 2023-08-06 DIAGNOSIS — R899 Unspecified abnormal finding in specimens from other organs, systems and tissues: Secondary | ICD-10-CM | POA: Diagnosis not present

## 2023-08-06 DIAGNOSIS — G4733 Obstructive sleep apnea (adult) (pediatric): Secondary | ICD-10-CM | POA: Diagnosis not present

## 2023-08-06 DIAGNOSIS — Z1211 Encounter for screening for malignant neoplasm of colon: Secondary | ICD-10-CM | POA: Diagnosis not present

## 2023-08-06 DIAGNOSIS — I700 Atherosclerosis of aorta: Secondary | ICD-10-CM

## 2023-08-06 DIAGNOSIS — Z860100 Personal history of colon polyps, unspecified: Secondary | ICD-10-CM

## 2023-08-06 DIAGNOSIS — Z0000 Encounter for general adult medical examination without abnormal findings: Secondary | ICD-10-CM

## 2023-08-06 NOTE — Assessment & Plan Note (Signed)
 Strongly encouraged patient again to reconsider updating sleep study.  Discussed risk of CVA, atrial fibrillation with untreated sleep apnea

## 2023-08-06 NOTE — Patient Instructions (Addendum)
 Call [facility] : [phone] to schedule colonoscopy.  Please let me know if any issues.  Referral was placed in November  Your vitamin D  level is low.  You may add over the counter vitamin D  1000 units by mouth daily .  Please call to arrange follow up with me a few months and request vitamin d  to be drawn then.     Please start crestor  again as we discussed  You may start crestor  once per week and  gradually increase until you are taking daily.   Please re consider sleep study down through pulmonology.  As discussed, untreated sleep apnea is associated with atrial fibrillation and stroke    Health Maintenance, Male Adopting a healthy lifestyle and getting preventive care are important in promoting health and wellness. Ask your health care provider about: The right schedule for you to have regular tests and exams. Things you can do on your own to prevent diseases and keep yourself healthy. What should I know about diet, weight, and exercise? Eat a healthy diet  Eat a diet that includes plenty of vegetables, fruits, low-fat dairy products, and lean protein. Do not eat a lot of foods that are high in solid fats, added sugars, or sodium. Maintain a healthy weight Body mass index (BMI) is a measurement that can be used to identify possible weight problems. It estimates body fat based on height and weight. Your health care provider can help determine your BMI and help you achieve or maintain a healthy weight. Get regular exercise Get regular exercise. This is one of the most important things you can do for your health. Most adults should: Exercise for at least 150 minutes each week. The exercise should increase your heart rate and make you sweat (moderate-intensity exercise). Do strengthening exercises at least twice a week. This is in addition to the moderate-intensity exercise. Spend less time sitting. Even light physical activity can be beneficial. Watch cholesterol and blood  lipids Have your blood tested for lipids and cholesterol at 64 years of age, then have this test every 5 years. You may need to have your cholesterol levels checked more often if: Your lipid or cholesterol levels are high. You are older than 64 years of age. You are at high risk for heart disease. What should I know about cancer screening? Many types of cancers can be detected early and may often be prevented. Depending on your health history and family history, you may need to have cancer screening at various ages. This may include screening for: Colorectal cancer. Prostate cancer. Skin cancer. Lung cancer. What should I know about heart disease, diabetes, and high blood pressure? Blood pressure and heart disease High blood pressure causes heart disease and increases the risk of stroke. This is more likely to develop in people who have high blood pressure readings or are overweight. Talk with your health care provider about your target blood pressure readings. Have your blood pressure checked: Every 3-5 years if you are 19-74 years of age. Every year if you are 24 years old or older. If you are between the ages of 56 and 48 and are a current or former smoker, ask your health care provider if you should have a one-time screening for abdominal aortic aneurysm (AAA). Diabetes Have regular diabetes screenings. This checks your fasting blood sugar level. Have the screening done: Once every three years after age 56 if you are at a normal weight and have a low risk for diabetes. More  often and at a younger age if you are overweight or have a high risk for diabetes. What should I know about preventing infection? Hepatitis B If you have a higher risk for hepatitis B, you should be screened for this virus. Talk with your health care provider to find out if you are at risk for hepatitis B infection. Hepatitis C Blood testing is recommended for: Everyone born from [birth year] through 1966. Anyone with  known risk factors for hepatitis C. Sexually transmitted infections (STIs) You should be screened each year for STIs, including gonorrhea and chlamydia, if: You are sexually active and are younger than 63 years of age. You are older than 64 years of age and your health care provider tells you that you are at risk for this type of infection. Your sexual activity has changed since you were last screened, and you are at increased risk for chlamydia or gonorrhea. Ask your health care provider if you are at risk. Ask your health care provider about whether you are at high risk for HIV. Your health care provider may recommend a prescription medicine to help prevent HIV infection. If you choose to take medicine to prevent HIV, you should first get tested for HIV. You should then be tested every 3 months for as long as you are taking the medicine. Follow these instructions at home: Alcohol use Do not drink alcohol if your health care provider tells you not to drink. If you drink alcohol: Limit how much you have to 0-2 drinks a day. Know how much alcohol is in your drink. In the [place] one drink equals one 12 oz bottle of beer (355 mL), one 5 oz glass of wine (148 mL), or one 1 oz glass of hard liquor (44 mL). Lifestyle Do not use any products that contain nicotine or tobacco. These products include cigarettes, chewing tobacco, and vaping devices, such as e-cigarettes. If you need help quitting, ask your health care provider. Do not use street drugs. Do not share needles. Ask your health care provider for help if you need support or information about quitting drugs. General instructions Schedule regular health, dental, and eye exams. Stay current with your vaccines. Tell your health care provider if: You often feel depressed. You have ever been abused or do not feel safe at home. Summary Adopting a healthy lifestyle and getting preventive care are important in promoting health and wellness. Follow  your health care provider's instructions about healthy diet, exercising, and getting tested or screened for diseases. Follow your health care provider's instructions on monitoring your cholesterol and blood pressure. This information is not intended to replace advice given to you by your health care provider. Make sure you discuss any questions you have with your health care provider. Document Revised: 12/03/2020 Document Reviewed: 12/03/2020 Elsevier Patient Education  2024 [employer].

## 2023-08-06 NOTE — Assessment & Plan Note (Signed)
 Uncontrolled.  Reiterate the importance of compliance with Crestor 40 mg.  Advised he may take once weekly and gradually increase if concern for myalgia.  If recurrence of myalgia, plan to discuss PCSK9 Inhibitor

## 2023-08-06 NOTE — Telephone Encounter (Signed)
 Patient to be contacted after cardiac clearance and blood thinner advice has been received from cardiologist Dr. Franky Ned.  Patients last colonoscopy was 10/13/18 with Dr. Aundria.  No polyps were present.  I informed patient that since he did not have any colon polyps the guidelines recommend screening every 10 years without an immediate family history of colon cancer.  He verbalized understanding and stated that he is 31 and does not want to take any chances.  Gastroenterology Pre-Procedure Review  Request Date: TBD Requesting Physician: Dr. NELLIE  PATIENT REVIEW QUESTIONS: The patient responded to the following health history questions as indicated:    1. Are you having any GI issues? no 2. Do you have a personal history of Polyps? yes (last colonoscopy performed by Dr. Aundria 10/13/18 no polyps were present. ) 3. Do you have a family history of Colon Cancer or Polyps? no 4. Diabetes Mellitus? no 5. Joint replacements in the past 12 months?no 6. Major health problems in the past 3 months?no 7. Any artificial heart valves, MVP, or defibrillator?no    MEDICATIONS & ALLERGIES:    Patient reports the following regarding taking any anticoagulation/antiplatelet therapy:   Plavix, Coumadin, Eliquis, Xarelto , Lovenox, Pradaxa, Brilinta, or Effient? no Aspirin? no  Patient confirms/reports the following medications:  Current Outpatient Medications  Medication Sig Dispense Refill   azelastine  (ASTELIN ) 0.1 % nasal spray Place 1 spray into both nostrils 2 (two) times daily. Use in each nostril as directed 30 mL 4   METOPROLOL  SUCCINATE PO Take by mouth. Pt takes 80 mg 2x's daily     rivaroxaban  (XARELTO ) 20 MG TABS tablet Take 20 mg by mouth at bedtime.      rosuvastatin  (CRESTOR ) 40 MG tablet Take 1 tablet (40 mg total) by mouth daily. 90 tablet 3   No current facility-administered medications for this visit.    Patient confirms/reports the following allergies:  Allergies  Allergen  Reactions   Sulfa Antibiotics Hives    rash    Sulfasalazine Hives   Bactrim [Sulfamethoxazole-Trimethoprim]     Sulfa/Rash   Lisinopril     cough   Zetia  [Ezetimibe ]     Joint pain    No orders of the defined types were placed in this encounter.   AUTHORIZATION INFORMATION Primary Insurance: 1D#: Group #:  Secondary Insurance: 1D#: Group #:  SCHEDULE INFORMATION: Date: TBD Time: Location: [facility]

## 2023-08-06 NOTE — Progress Notes (Signed)
 Assessment & Plan:  Screening for colon cancer -     Ambulatory referral to Gastroenterology  Abnormal laboratory test -     Lipid panel  Obstructive sleep apnea Assessment & Plan: Strongly encouraged patient again to reconsider updating sleep study.  Discussed risk of CVA, atrial fibrillation with untreated sleep apnea   Aortic atherosclerosis (HCC) Assessment & Plan: Uncontrolled.  Reiterate the importance of compliance with Crestor  40 mg.  Advised he may take once weekly and gradually increase if concern for myalgia.  If recurrence of myalgia, plan to discuss PCSK9 Inhibitor   Routine physical examination Assessment & Plan: Reviewed labs from November 2024.  Encouraged exercise program.  Colonoscopy is scheduled      Return precautions given.   Risks, benefits, and alternatives of the medications and treatment plan prescribed today were discussed, and patient expressed understanding.   Education regarding symptom management and diagnosis given to patient on AVS either electronically or printed.  No follow-ups on file.  Jake Northern, FNP  Subjective:    Patient ID: Jake Liu, male    DOB: 05/09/1961, 63 y.o.   MRN: [id number]  CC: Jake Liu is a 63 y.o. male who presents today for physical exam.    HPI: He is eating healthier, more fish.   Drinking less alcohol.   He is not eating fast food.     He has not resumed crestor  40mg   He tried lipitor in the past and doesn't recall if he had a side effect.    Colorectal  Cancer Screening: due this year; done Dr Aundria, nonbleeding hemorrhoids, repeat 5 in years  Prostate Cancer Screening: No decreased urinary stream  Lung Cancer Screening: No 30 year pack year history and > 50 years to 80 years.  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 He is trying to  limit alcohol.   Smoking/tobacco use: Nonsmoker.    Health Maintenance  Topic Date Due   Pneumococcal Vaccination (1 of 2 - PCV) Never done   Colon Cancer Screening  10/13/2023   Flu Shot  10/26/2023*   Zoster (Shingles) Vaccine (1 of 2) 11/04/2023*   DTaP/Tdap/Td vaccine (2 - Td or Tdap) 12/26/2032   Hepatitis C Screening  Completed   HIV Screening  Completed   HPV Vaccine  Aged Out   COVID-19 Vaccine  Discontinued  *Topic was postponed. The date shown is not the original due date.     ALLERGIES: Sulfa antibiotics, Sulfasalazine, Bactrim [sulfamethoxazole-trimethoprim], Lisinopril, and Zetia  [ezetimibe ]  Current Outpatient Medications on File Prior to Visit  Medication Sig Dispense Refill   azelastine  (ASTELIN ) 0.1 % nasal spray Place 1 spray into both nostrils 2 (two) times daily. Use in each nostril as directed 30 mL 4   METOPROLOL  SUCCINATE PO Take by mouth. Pt takes 80 mg 2x's daily     rivaroxaban  (XARELTO ) 20 MG TABS tablet Take 20 mg by mouth at bedtime.      rosuvastatin  (CRESTOR ) 40 MG tablet Take 1 tablet (40 mg total) by mouth daily. 90 tablet 3   No current facility-administered medications on file prior to visit.    Review of Systems  Constitutional:  Negative for chills and fever.  HENT:  Negative for congestion.   Respiratory:  Negative for cough.   Cardiovascular:  Negative for chest pain,  palpitations and leg swelling.  Gastrointestinal:  Negative for diarrhea, nausea and vomiting.  Musculoskeletal:  Negative for myalgias.  Skin:  Negative for rash.  Neurological:  Negative for headaches.  Hematological:  Negative for adenopathy.  Psychiatric/Behavioral:  Negative for confusion.       Objective:    BP 130/76   Pulse 70   Temp 97.7 F (36.5 C) (Oral)   Ht 6' (1.829 m)   Wt 195 lb 9.6 oz (88.7 kg)   SpO2 99%   BMI 26.53 kg/m   BP Readings from Last 3 Encounters:  08/06/23 130/76  07/14/23 132/80  06/08/23 118/70   Wt Readings from Last 3  Encounters:  08/06/23 195 lb 9.6 oz (88.7 kg)  07/14/23 194 lb (88 kg)  06/08/23 194 lb (88 kg)    Physical Exam Vitals reviewed.  Constitutional:      Appearance: He is well-developed.  Cardiovascular:     Rate and Rhythm: Regular rhythm.     Heart sounds: Normal heart sounds.  Pulmonary:     Effort: Pulmonary effort is normal. No respiratory distress.     Breath sounds: Normal breath sounds. No wheezing, rhonchi or rales.  Skin:    General: Skin is warm and dry.  Neurological:     Mental Status: He is alert.  Psychiatric:        Speech: Speech normal.        Behavior: Behavior normal.

## 2023-08-06 NOTE — Assessment & Plan Note (Signed)
 Reviewed labs from November 2024.  Encouraged exercise program.  Colonoscopy is scheduled

## 2023-08-07 NOTE — Progress Notes (Signed)
    Ben Athziry Millican D.[facility] [address] [place] [address] Phone: [phone]   Assessment and Plan:     1. Chronic pain of left knee 2. Contusion of left knee, initial encounter  -Chronic with exacerbation, subsequent visit - Overall improvement in acute flare of chronic knee pain after fall onto knee on 07/10/2023.  Consistent with improving knee contusion.  Patient has improved with relative rest, Tylenol  use, HEP - Continue activity as tolerated - Tylenol   3.  Rheumatoid arthritis - Chronic with exacerbation - Patient discontinued Humira due to his concerns for long-term side effects of using this medication chronically.  It appears that he is now experiencing a flare of rheumatoid arthritis, with multiple chronic joint pains and increased stiffness - Recommend reestablishing care with rheumatology and discussing restarting Humira versus alternative medication options  Pertinent previous records reviewed include none  Follow Up: As needed   Subjective:   I, Chestine Reeves, am serving as a neurosurgeon for Doctor Morene Mace   Chief Complaint: left knee pain    HPI:    07/14/2023 Patient is a 63 year old male with left knee pain. Patient states that he slipped and fell Friday. He states he had knee swelling. Decreased ROM due to pain. He is improving but still has some pain. Tylenol  for the pain and that helps. Ice helps. Pain radiates down to is calf but that's where he hit. He does endorse swelling in his ankle    08/10/2023 Patient states that he is doing really well. Only has occasional flares. He states all his joints hurt when he wakes is going to see rhuematology   Relevant Historical Information: History of atrial fibrillation status post cardioversion, on chronic anticoagulation with Xarelto , GERD    Additional pertinent review of systems negative.   Current Outpatient Medications:    azelastine  (ASTELIN ) 0.1 % nasal  spray, Place 1 spray into both nostrils 2 (two) times daily. Use in each nostril as directed, Disp: 30 mL, Rfl: 4   METOPROLOL  SUCCINATE PO, Take by mouth. Pt takes 80 mg 2x's daily, Disp: , Rfl:    rivaroxaban  (XARELTO ) 20 MG TABS tablet, Take 20 mg by mouth at bedtime. , Disp: , Rfl:    rosuvastatin  (CRESTOR ) 40 MG tablet, Take 1 tablet (40 mg total) by mouth daily., Disp: 90 tablet, Rfl: 3   Objective:     Vitals:   08/10/23 1517  Pulse: 88  SpO2: 98%  Weight: 195 lb (88.5 kg)  Height: 6' (1.829 m)      Body mass index is 26.45 kg/m.    Physical Exam:    General:  awake, alert oriented, no acute distress nontoxic Skin: no suspicious lesions or rashes Neuro:sensation intact and strength 5/5 with no deficits, no atrophy, normal muscle tone Psych: No signs of anxiety, depression or other mood disorder  Left knee: No swelling No deformity Neg fluid wave, joint milking ROM Flex 110, Ext 0 TTP mildly fibular head NTTP over the quad tendon, medial fem condyle, lat fem condyle, patella, plica, patella tendon, tibial tuberostiy,    posterior fossa, pes anserine bursa, gerdy's tubercle, medial jt line, lateral jt line Neg anterior and posterior drawer Neg lachman Neg sag sign Negative varus stress Negative valgus stress Negative McMurray Negative [place]ss[place]  Gait normal    Electronically signed by:  Odis Mace D.[facility] 4:29 PM 08/10/23

## 2023-08-10 ENCOUNTER — Ambulatory Visit (INDEPENDENT_AMBULATORY_CARE_PROVIDER_SITE_OTHER): Payer: Commercial Managed Care - PPO | Admitting: Sports Medicine

## 2023-08-10 VITALS — HR 88 | Ht 72.0 in | Wt 195.0 lb

## 2023-08-10 DIAGNOSIS — S8002XA Contusion of left knee, initial encounter: Secondary | ICD-10-CM | POA: Diagnosis not present

## 2023-08-10 DIAGNOSIS — M25562 Pain in left knee: Secondary | ICD-10-CM

## 2023-08-10 DIAGNOSIS — M069 Rheumatoid arthritis, unspecified: Secondary | ICD-10-CM | POA: Diagnosis not present

## 2023-08-10 DIAGNOSIS — G8929 Other chronic pain: Secondary | ICD-10-CM | POA: Diagnosis not present

## 2023-08-25 NOTE — Progress Notes (Deleted)
 NO SHOW

## 2023-08-31 ENCOUNTER — Ambulatory Visit: Payer: Commercial Managed Care - PPO | Attending: Cardiovascular Disease | Admitting: Cardiovascular Disease

## 2023-09-02 ENCOUNTER — Ambulatory Visit: Payer: Commercial Managed Care - PPO | Admitting: Dermatology

## 2023-09-04 ENCOUNTER — Telehealth: Payer: Self-pay

## 2023-09-04 NOTE — Telephone Encounter (Signed)
 Spoke to pt explained to him that Daivd Dub  is out of the office this afternoon and I will see to it that she sees this first thing Monday Morning to address it.Pt was ok with her addressing when she returns on  Mon.

## 2023-09-04 NOTE — Telephone Encounter (Signed)
 Copied from CRM [id number]. Topic: Clinical - Medical Advice >> Sep 04, 2023 11:22 AM Leila C wrote: Reason for CRM: Patient was referred to have a colonoscopy, the gastroenterology called patient, informing patient is not due and will call patient back. Patient has not heard back from the GI office. Informed patient of Referral/Gastroenterology 08/06/23: Referral closed until clearance can be granted from Dr. Debby. Patient to be contacted after cardiac clearance and blood thinner advice has been received from cardiologist Dr. Franky Debby. Patient states NP, Rollene Northern told him to contact her if there's any problems. Patient states he doesn't understand and wants NP, Arnett to handle this matter for him Please advise [phone].

## 2023-09-07 NOTE — Telephone Encounter (Signed)
 LVM to inform pt that per Daivd Dub; ok to relay message to pt  Please advise him to keep appointment with Dr. Andy Bannister tomorrow in regards atrial fib and upcoming colonoscopy.  Per colonoscopy notes, he will need cardiac clearance prior to proceeding.  I cannot give him clearance as I am not part of the cardiology team

## 2023-09-07 NOTE — Telephone Encounter (Signed)
 Spoke to pt and advised him of message below, pt verbalized understanding  Advised him to keep appointment with Dr. Andy Bannister tomorrow in regards atrial fib and upcoming colonoscopy.  Per colonoscopy notes, he will need cardiac clearance prior to proceeding.  I cannot give him clearance as I am not part of the cardiology team

## 2023-09-11 NOTE — Telephone Encounter (Signed)
Faxed again on 09/11/23. Dr Raj Janus [phone].  Follow up on 09/18/23.  Marcelino Duster, CMA

## 2023-09-18 NOTE — Telephone Encounter (Signed)
Per 09/08/23 office note from Dr. Maisie Fus at [facility]:  "Anticoagulation for Colonoscopy Patient needs colonoscopy and requires instructions for managing anticoagulation. -Stop Xarelto two days before the procedure. -Restart Xarelto after the procedure".

## 2023-09-22 ENCOUNTER — Other Ambulatory Visit: Payer: Self-pay

## 2023-09-22 DIAGNOSIS — Z1211 Encounter for screening for malignant neoplasm of colon: Secondary | ICD-10-CM

## 2023-09-22 MED ORDER — NA SULFATE-K SULFATE-MG SULF 17.5-3.13-1.6 GM/177ML PO SOLN: 1.0000 | Freq: Once | ORAL | 0 refills | Status: AC

## 2023-09-22 NOTE — Addendum Note (Signed)
 Addended by: Avie Arenas on: 09/22/2023 09:38 AM   Modules accepted: Orders

## 2023-09-22 NOTE — Telephone Encounter (Signed)
 Patient has been scheduled his colonoscopy with Dr. Allegra Lai at [facility] 10/15/23.  Procedure report dated 10/13/18  reviewed again and Dr. Norma Fredrickson did recommend repeat in 5 years for screening purposes.  Advised per Dr. Maisie Fus to stop Xarelto (2) days prior to colonoscopy.  Restart (1) day after.  Thanks,  [place], [place]

## 2023-10-06 ENCOUNTER — Ambulatory Visit: Payer: Commercial Managed Care - PPO | Admitting: Dermatology

## 2023-10-06 ENCOUNTER — Encounter: Payer: Commercial Managed Care - PPO | Admitting: Family

## 2023-10-06 ENCOUNTER — Encounter: Payer: Self-pay | Admitting: Dermatology

## 2023-10-06 DIAGNOSIS — L821 Other seborrheic keratosis: Secondary | ICD-10-CM

## 2023-10-06 DIAGNOSIS — B351 Tinea unguium: Secondary | ICD-10-CM

## 2023-10-06 DIAGNOSIS — L219 Seborrheic dermatitis, unspecified: Secondary | ICD-10-CM

## 2023-10-06 DIAGNOSIS — W908XXA Exposure to other nonionizing radiation, initial encounter: Secondary | ICD-10-CM

## 2023-10-06 DIAGNOSIS — D239 Other benign neoplasm of skin, unspecified: Secondary | ICD-10-CM

## 2023-10-06 DIAGNOSIS — Z872 Personal history of diseases of the skin and subcutaneous tissue: Secondary | ICD-10-CM

## 2023-10-06 DIAGNOSIS — L578 Other skin changes due to chronic exposure to nonionizing radiation: Secondary | ICD-10-CM | POA: Diagnosis not present

## 2023-10-06 DIAGNOSIS — Z1283 Encounter for screening for malignant neoplasm of skin: Secondary | ICD-10-CM | POA: Diagnosis not present

## 2023-10-06 DIAGNOSIS — D229 Melanocytic nevi, unspecified: Secondary | ICD-10-CM

## 2023-10-06 DIAGNOSIS — D1801 Hemangioma of skin and subcutaneous tissue: Secondary | ICD-10-CM

## 2023-10-06 DIAGNOSIS — L814 Other melanin hyperpigmentation: Secondary | ICD-10-CM | POA: Diagnosis not present

## 2023-10-06 DIAGNOSIS — S90211D Contusion of right great toe with damage to nail, subsequent encounter: Secondary | ICD-10-CM

## 2023-10-06 MED ORDER — HYDROCORTISONE 2.5 % EX CREA: TOPICAL_CREAM | CUTANEOUS | 0 refills | Status: AC

## 2023-10-06 NOTE — Patient Instructions (Signed)

## 2023-10-06 NOTE — Progress Notes (Signed)
 Follow-Up Visit   Subjective  Jake Liu is a 63 y.o. male who presents for the following: Skin Cancer Screening and Full Body Skin Exam  The patient presents for Total-Body Skin Exam (TBSE) for skin cancer screening and mole check. The patient has spots, moles and lesions to be evaluated, some may be new or changing and the patient may have concern these could be cancer.  Lesion on the L occipital scalp - bleeds and itches, recheck previously noted lentigo and AK  The following portions of the chart were reviewed this encounter and updated as appropriate: medications, allergies, medical history  Review of Systems:  No other skin or systemic complaints except as noted in HPI or Assessment and Plan.  Objective  Well appearing patient in no apparent distress; mood and affect are within normal limits.  A full examination was performed including scalp, head, eyes, ears, nose, lips, neck, chest, axillae, abdomen, back, buttocks, bilateral upper extremities, bilateral lower extremities, hands, feet, fingers, toes, fingernails, and toenails. All findings within normal limits unless otherwise noted below.   Relevant physical exam findings are noted in the Assessment and Plan.       Assessment & Plan   SKIN CANCER SCREENING PERFORMED TODAY.  ACTINIC DAMAGE - Chronic condition, secondary to cumulative UV/sun exposure - diffuse scaly erythematous macules with underlying dyspigmentation - Recommend daily broad spectrum sunscreen SPF 30+ to sun-exposed areas, reapply every 2 hours as needed.  - Staying in the shade or wearing long sleeves, sun glasses (UVA+UVB protection) and wide brim hats (4-inch brim around the entire circumference of the hat) are also recommended for sun protection.  - Call for new or changing lesions.  LENTIGINES, SEBORRHEIC KERATOSES, HEMANGIOMAS - Benign normal skin lesions - Benign-appearing - Call for any changes  MELANOCYTIC NEVI - Tan-brown and/or  pink-flesh-colored symmetric macules and papules - Benign appearing on exam today - Observation - Call clinic for new or changing moles - Recommend daily use of broad spectrum spf 30+ sunscreen to sun-exposed areas.   SEBORRHEIC DERMATITIS Exam: Pink patches with greasy scale at the inferior central occipital scalp  Chronic and persistent condition with duration or expected duration over one year. Condition is bothersome/symptomatic for patient. Currently flared.  Seborrheic Dermatitis is a chronic persistent rash characterized by pinkness and scaling most commonly of the mid face but also can occur on the scalp (dandruff), ears; mid chest, mid back and groin.  It tends to be exacerbated by stress and cooler weather.  People who have neurologic disease may experience new onset or exacerbation of existing seborrheic dermatitis.  The condition is not curable but treatable and can be controlled.  Treatment Plan: Start HC 2.5% cream BID PRN itch and irritation.  HISTORY OF PRECANCEROUS ACTINIC KERATOSIS - R zygoma - site(s) of PreCancerous Actinic Keratosis clear today. - these may recur and new lesions may form requiring treatment to prevent transformation into skin cancer - observe for new or changing spots and contact [facility] for appointment if occur - photoprotection with sun protective clothing; sunglasses and broad spectrum sunscreen with SPF of at least 30 + and frequent self skin exams recommended - yearly exams by a dermatologist recommended for persons with history of PreCancerous Actinic Keratoses  DERMATOFIBROMA Exam: Firm pink/brown papulenodule with dimple sign. Treatment Plan: A dermatofibroma is a benign growth possibly related to trauma, such as an insect bite, cut from shaving, or inflamed acne-type bump.  Treatment options to remove include shave or excision with  resulting scar and risk of recurrence.  Since benign-appearing and not bothersome, will observe for  now.   Subungual hematoma at right great toenail, background of onychomycosis Exam: visualized dried hemorrhage on underside of R first toenail plate. No lesion on underlying nail bed, appears to be growing out with the nail on today's exam.  Discoloration, onychauxis, subungual debris of several toenails   Plan: Observe. Will take 6+ months for hemorrhage to fully grow out  LENTIGINES Exam: tan macule at left posterior helix with focal areas of increased pigmentation Due to sun exposure - 1.0 cm at longest axis Treatment Plan: Suspect lentigo. Ddx lentigo maligna. MULTIPLE BENIGN NEVI   LENTIGINES   ACTINIC ELASTOSIS   DERMATOFIBROMA   SEBORRHEIC KERATOSES   CHERRY ANGIOMA   SEBORRHEIC DERMATITIS   Return in about 6 months (around 04/07/2024) for TBSE.  Maylene Roes, CMA, am acting as scribe for Elie Goody, MD .   Documentation: I have reviewed the above documentation for accuracy and completeness, and I agree with the above.  Elie Goody, MD

## 2023-10-08 ENCOUNTER — Encounter: Payer: Self-pay | Admitting: Gastroenterology

## 2023-10-14 ENCOUNTER — Telehealth: Payer: Self-pay

## 2023-10-14 NOTE — Telephone Encounter (Signed)
 Call returned to patient.  LVM advising him to call me back to reschedule his colonoscopy from tomorrow due to low fiber diet. Nuts take longer to leave the digestive tract, so to avoid possibly having to repeat procedure with a 2 day prep-its best to reschedule from tomorrow.  Thanks, [place], [place]

## 2023-10-14 NOTE — Telephone Encounter (Signed)
 Called Mr. Crumrine back to let him know that he should reschedule since it takes nuts a longer time to leave the digestive tract.  He stated that he ate the nuts on Monday.  I told him in that case he should be good to proceed as scheduled for tomorrow as he was advised by Endo already.  He has stopped his xarelto on Monday as his Cardiologist advised for him to do.  Thanks, [place], [place]

## 2023-10-14 NOTE — Telephone Encounter (Signed)
 Pt requesting call back with question in ref to procedure

## 2023-10-14 NOTE — Telephone Encounter (Signed)
 Pt returning call

## 2023-10-14 NOTE — Telephone Encounter (Signed)
 Trish in Endo states that patient told her that yesterday he had a salad with nuts in it and wants to know if he is still okay to have colonoscopy tomorrow.

## 2023-10-15 ENCOUNTER — Ambulatory Visit: Admitting: Certified Registered"

## 2023-10-15 ENCOUNTER — Other Ambulatory Visit: Payer: Self-pay

## 2023-10-15 ENCOUNTER — Encounter: Payer: Self-pay | Admitting: Gastroenterology

## 2023-10-15 ENCOUNTER — Encounter
Admission: RE | Disposition: A | Discharge status: Home / Self Care | Payer: Self-pay | Source: Home / Self Care | Attending: Gastroenterology

## 2023-10-15 ENCOUNTER — Ambulatory Visit
Admission: RE | Admit: 2023-10-15 | Discharge: 2023-10-15 | Disposition: A | Discharge status: Home / Self Care | Payer: Commercial Managed Care - PPO | Attending: Gastroenterology | Admitting: Gastroenterology

## 2023-10-15 DIAGNOSIS — K6389 Other specified diseases of intestine: Secondary | ICD-10-CM | POA: Diagnosis not present

## 2023-10-15 DIAGNOSIS — Z8673 Personal history of transient ischemic attack (TIA), and cerebral infarction without residual deficits: Secondary | ICD-10-CM | POA: Diagnosis not present

## 2023-10-15 DIAGNOSIS — K219 Gastro-esophageal reflux disease without esophagitis: Secondary | ICD-10-CM | POA: Insufficient documentation

## 2023-10-15 DIAGNOSIS — K635 Polyp of colon: Secondary | ICD-10-CM | POA: Diagnosis not present

## 2023-10-15 DIAGNOSIS — G4730 Sleep apnea, unspecified: Secondary | ICD-10-CM | POA: Insufficient documentation

## 2023-10-15 DIAGNOSIS — I110 Hypertensive heart disease with heart failure: Secondary | ICD-10-CM | POA: Diagnosis not present

## 2023-10-15 DIAGNOSIS — Z1211 Encounter for screening for malignant neoplasm of colon: Secondary | ICD-10-CM | POA: Diagnosis present

## 2023-10-15 DIAGNOSIS — Z7901 Long term (current) use of anticoagulants: Secondary | ICD-10-CM | POA: Insufficient documentation

## 2023-10-15 DIAGNOSIS — Z79899 Other long term (current) drug therapy: Secondary | ICD-10-CM | POA: Insufficient documentation

## 2023-10-15 DIAGNOSIS — I509 Heart failure, unspecified: Secondary | ICD-10-CM | POA: Insufficient documentation

## 2023-10-15 DIAGNOSIS — I209 Angina pectoris, unspecified: Secondary | ICD-10-CM | POA: Insufficient documentation

## 2023-10-15 DIAGNOSIS — I4891 Unspecified atrial fibrillation: Secondary | ICD-10-CM | POA: Insufficient documentation

## 2023-10-15 DIAGNOSIS — Z87891 Personal history of nicotine dependence: Secondary | ICD-10-CM | POA: Diagnosis not present

## 2023-10-15 HISTORY — PX: COLONOSCOPY WITH PROPOFOL: SHX5780

## 2023-10-15 SURGERY — COLONOSCOPY WITH PROPOFOL
Anesthesia: General

## 2023-10-15 MED ORDER — STERILE WATER FOR IRRIGATION IR SOLN
Status: DC | PRN
  Administered 2023-10-15 (×2): 60 mL

## 2023-10-15 MED ORDER — PROPOFOL 500 MG/50ML IV EMUL
INTRAVENOUS | Status: DC | PRN
  Administered 2023-10-15: 165 ug/kg/min via INTRAVENOUS

## 2023-10-15 MED ORDER — PROPOFOL 10 MG/ML IV BOLUS
INTRAVENOUS | Status: DC | PRN
  Administered 2023-10-15: 60 mg via INTRAVENOUS
  Administered 2023-10-15: 40 mg via INTRAVENOUS

## 2023-10-15 MED ORDER — SODIUM CHLORIDE 0.9 % IV SOLN: INTRAVENOUS | Status: DC

## 2023-10-15 MED ORDER — LIDOCAINE HCL (CARDIAC) PF 100 MG/5ML IV SOSY
PREFILLED_SYRINGE | INTRAVENOUS | Status: DC | PRN
  Administered 2023-10-15: 100 mg via INTRAVENOUS

## 2023-10-15 NOTE — Transfer of Care (Signed)
 Immediate Anesthesia Transfer of Care Note  Patient: Jake Liu  Procedure(s) Performed: COLONOSCOPY WITH PROPOFOL  Patient Location: Endoscopy Unit  Anesthesia Type:General  Level of Consciousness: drowsy and patient cooperative  Airway & Oxygen Therapy: Patient Spontanous Breathing and Patient connected to face mask oxygen  Post-op Assessment: Report given to RN and Post -op Vital signs reviewed and stable  Post vital signs: Reviewed and stable  Last Vitals:  Vitals Value Taken Time  BP 119/78 10/15/23 1151  Temp 35.9 C 10/15/23 1151  Pulse 90 10/15/23 1157  Resp 20 10/15/23 1157  SpO2 96 % 10/15/23 1157  Vitals shown include unfiled device data.  Last Pain:  Vitals:   10/15/23 1151  TempSrc: Temporal  PainSc: Asleep         Complications: No notable events documented.

## 2023-10-15 NOTE — Op Note (Signed)
 [facility] Patient Name: Jake Liu Procedure Date: 10/15/2023 11:29 AM MRN: [id number] Account #: [id number] Date of Birth: 06/03/1961 Admit Type: Outpatient Age: 63 Room: [facility] ENDO ROOM 2 Gender: Male Note Status: Finalized Instrument Name: Colonoscope [id number] Procedure:             Colonoscopy Indications:           Screening for colorectal malignant neoplasm, Last                         colonoscopy: March 2020 Providers:             Toney Reil MD, MD Referring MD:          No Local Md, MD (Referring MD) Medicines:             General Anesthesia Complications:         No immediate complications. Estimated blood loss:                         Minimal. Procedure:             Pre-Anesthesia Assessment:                        - Prior to the procedure, a History and Physical was                         performed, and patient medications and allergies were                         reviewed. The patient is competent. The risks and                         benefits of the procedure and the sedation options and                         risks were discussed with the patient. All questions                         were answered and informed consent was obtained.                         Patient identification and proposed procedure were                         verified by the physician, the nurse, the                         anesthesiologist, the anesthetist and the technician                         in the pre-procedure area in the procedure room in the                         endoscopy suite. Mental Status Examination: alert and                         oriented. Airway Examination: normal oropharyngeal  airway and neck mobility. Respiratory Examination:                         clear to auscultation. CV Examination: normal.                         Prophylactic Antibiotics: The patient does not require                          prophylactic antibiotics. Prior Anticoagulants: The                         patient has taken Xarelto (rivaroxaban), last dose was                         2 days prior to procedure. ASA Grade Assessment: III -                         A patient with severe systemic disease. After                         reviewing the risks and benefits, the patient was                         deemed in satisfactory condition to undergo the                         procedure. The anesthesia plan was to use general                         anesthesia. Immediately prior to administration of                         medications, the patient was re-assessed for adequacy                         to receive sedatives. The heart rate, respiratory                         rate, oxygen saturations, blood pressure, adequacy of                         pulmonary ventilation, and response to care were                         monitored throughout the procedure. The physical                         status of the patient was re-assessed after the                         procedure.                        After obtaining informed consent, the colonoscope was                         passed under direct vision. Throughout the procedure,  the patient's blood pressure, pulse, and oxygen                         saturations were monitored continuously. The                         Colonoscope was introduced through the anus and                         advanced to the the terminal ileum, with                         identification of the appendiceal orifice and IC                         valve. The colonoscopy was performed without                         difficulty. The patient tolerated the procedure well.                         The quality of the bowel preparation was evaluated                         using the BBPS [facility] Bowel Preparation Scale) with                         scores of: Right Colon = 3,  Transverse Colon = 3 and                         Left Colon = 3 (entire mucosa seen well with no                         residual staining, small fragments of stool or opaque                         liquid). The total BBPS score equals 9. The terminal                         ileum, ileocecal valve, appendiceal orifice, and                         rectum were photographed. Findings:      The perianal and digital rectal examinations were normal. Pertinent       negatives include normal sphincter tone and no palpable rectal lesions.      The terminal ileum appeared normal.      A 3 mm polyp was found in the cecum. The polyp was sessile. The polyp       was removed with a cold snare. Resection and retrieval were complete.       Estimated blood loss: none.      The retroflexed view of the distal rectum and anal verge was normal and       showed no anal or rectal abnormalities. Impression:            - The examined portion of the ileum was normal.                        -  One 3 mm polyp in the cecum, removed with a cold                         snare. Resected and retrieved.                        - The distal rectum and anal verge are normal on                         retroflexion view. Recommendation:        - Discharge patient to home (with escort).                        - Resume previous diet today.                        - Continue present medications.                        - Await pathology results.                        - Resume Xarelto (rivaroxaban) at prior dose today.                         Refer to managing physician for further adjustment of                         therapy.                        - Repeat colonoscopy in 7-10 years for surveillance                         based on pathology results. Procedure Code(s):     --- Professional ---                        [phone], Colonoscopy, flexible; with removal of                         tumor(s), polyp(s), or other lesion(s) by  snare                         technique Diagnosis Code(s):     --- Professional ---                        Z12.11, Encounter for screening for malignant neoplasm                         of colon                        D12.0, Benign neoplasm of cecum CPT copyright 2022 American Medical Association. All rights reserved. The codes documented in this report are preliminary and upon coder review may  be revised to meet current compliance requirements. Dr. Libby Maw Toney Reil MD, MD 10/15/2023 11:50:05 AM This report has been signed electronically. Number of Addenda: 0 Note Initiated On: 10/15/2023 11:29 AM Scope Withdrawal Time: 0 hours 9 minutes 52 seconds  Total Procedure Duration:  0 hours 12 minutes 21 seconds  Estimated Blood Loss:  Estimated blood loss: none.      [facility]

## 2023-10-15 NOTE — Anesthesia Procedure Notes (Signed)
 Procedure Name: General with mask airway Date/Time: 10/15/2023 11:35 AM  Performed by: Mohammed Kindle, CRNAPre-anesthesia Checklist: Patient identified, Emergency Drugs available, Suction available and Patient being monitored Patient Re-evaluated:Patient Re-evaluated prior to induction Oxygen Delivery Method: Simple face mask Induction Type: IV induction Placement Confirmation: positive ETCO2 and breath sounds checked- equal and bilateral Dental Injury: Teeth and Oropharynx as per pre-operative assessment

## 2023-10-15 NOTE — H&P (Signed)
 Arlyss Repress, MD [address]  [address]  [place], [place] [address]  Main: [phone]  Fax: [phone] Pager: [phone]  Primary Care Physician:  Allegra Grana, FNP Primary Gastroenterologist:  Dr. Arlyss Repress  Pre-Procedure History & Physical: HPI:  Jake Liu is a 63 y.o. male is here for an colonoscopy.   Past Medical History:  Diagnosis Date   Actinic keratosis    Anginal pain (HCC)    a. 01/2012 MV: EF 59%, no ischemia/infarct.   Arthritis    Atrial flutter (HCC)    Coronary artery calcification seen on CT scan    a. 08/2018 Cardiac CT: Ca2+ = 1228 (98th%'ile).   Fatty liver    borderline   GERD (gastroesophageal reflux disease)    Headache    History of TIAs    Hypertension    Insomnia    Moderate to severe aortic insufficiency    a. 02/2023 TEE: EF >55%, LVIDd 5.2cm and LVIDs of 2.6cm. No LA/LAA thrombus. LAE. Mild fxnl MR 2/2 dil of LA and AI jet. Mod-sev AI.   Persistent atrial fibrillation (HCC)    a. s/p prior PVI in 2017 w/ touch-up ablation 08/2022; b. multiple DCCVs (most recent 08/2021, 01/2022, 11/2022, 02/2023); c. CHA2DS2VASc = 3-->Xarelto.   Sciatica    Seronegative arthritis 09/27/2019   Sleep apnea    No CPAP    Past Surgical History:  Procedure Laterality Date   ABLATION N/A    for af   COLONOSCOPY     COLONOSCOPY WITH PROPOFOL N/A 10/13/2018   Procedure: COLONOSCOPY WITH PROPOFOL;  Surgeon: Toledo, Boykin Nearing, MD;  Location: [facility] ENDOSCOPY;  Service: Gastroenterology;  Laterality: N/A;   ESOPHAGOGASTRODUODENOSCOPY     ESOPHAGOGASTRODUODENOSCOPY (EGD) WITH PROPOFOL N/A 10/13/2018   Procedure: ESOPHAGOGASTRODUODENOSCOPY (EGD) WITH PROPOFOL;  Surgeon: Toledo, Boykin Nearing, MD;  Location: [facility] ENDOSCOPY;  Service: Gastroenterology;  Laterality: N/A;   HERNIA REPAIR     KNEE SURGERY Left    UMBILICAL HERNIA REPAIR N/A 12/05/2019   Procedure: LAPAROSCOPIC UMBILICAL HERNIA REPAIR;  Surgeon: Ovidio Kin, MD;  Location: [facility] ORS;   Service: General;  Laterality: N/A;    Prior to Admission medications   Medication Sig Start Date End Date Taking? Authorizing Provider  METOPROLOL SUCCINATE PO Take by mouth. Pt takes 80 mg 2x's daily   Yes [provider]  sotalol (BETAPACE) 80 MG tablet Take by mouth. 09/08/23  Yes [provider]  azelastine (ASTELIN) 0.1 % nasal spray Place 1 spray into both nostrils 2 (two) times daily. Use in each nostril as directed 03/04/23   Allegra Grana, FNP  hydrocortisone 2.5 % cream Apply to itchy areas of the scalp QD-BID PRN itch and irritation. 10/06/23   Elie Goody, MD  rivaroxaban (XARELTO) 20 MG TABS tablet Take 20 mg by mouth at bedtime.  04/01/13   [provider]  rosuvastatin (CRESTOR) 40 MG tablet Take 1 tablet (40 mg total) by mouth daily. Patient not taking: Reported on 10/06/2023 06/08/23   Allegra Grana, FNP    Allergies as of 09/22/2023 - Review Complete 08/06/2023  Allergen Reaction Noted   Sulfa antibiotics Hives 03/04/2012   Sulfasalazine Hives 07/06/2015   Bactrim [sulfamethoxazole-trimethoprim]  03/21/2011   Lisinopril  01/12/2012   Zetia [ezetimibe]  11/30/2019    Family History  Problem Relation Age of Onset   Heart disease Mother 20       CABG x 4   COPD Mother    Stroke Mother 37  died at 52   Asthma Father     Social History   Socioeconomic History   Marital status: Single    Spouse name: Not on file   Number of children: Not on file   Years of education: Not on file   Highest education level: Not on file  Occupational History   Not on file  Tobacco Use   Smoking status: Former   Smokeless tobacco: Never   Tobacco comments:    Smoked when lived in [place] and smoked for one year.   Vaping Use   Vaping status: Never Used  Substance and Sexual Activity   Alcohol use: Yes    Alcohol/week: 14.0 standard drinks of alcohol    Types: 7 Glasses of wine, 7 Standard drinks or equivalent per week    Comment:  drinks at night., bottle and half of red wine;  Drinks 10 cups coffee daily   Drug use: No   Sexual activity: Not on file  Other Topics Concern   Not on file  Social History Narrative   Sales, fuels.    Girlfriend   Children      Social Drivers of Corporate investment banker Strain: Not on file  Food Insecurity: Not on file  Transportation Needs: Not on file  Physical Activity: Not on file  Stress: Not on file  Social Connections: Not on file  Intimate Partner Violence: Not on file    Review of Systems: See HPI, otherwise negative ROS  Physical Exam: BP (!) 134/95   Pulse 96   Temp (!) 96.4 F (35.8 C) (Temporal)   Resp 20   Ht 6' (1.829 m)   Wt 89.8 kg   SpO2 98%   BMI 26.85 kg/m  General:   Alert,  pleasant and cooperative in NAD Head:  Normocephalic and atraumatic. Neck:  Supple; no masses or thyromegaly. Lungs:  Clear throughout to auscultation.    Heart:  Regular rate and rhythm. Abdomen:  Soft, nontender and nondistended. Normal bowel sounds, without guarding, and without rebound.   Neurologic:  Alert and  oriented x4;  grossly normal neurologically.  Impression/Plan: Jake Liu is here for an colonoscopy to be performed for colon cancer screening  Risks, benefits, limitations, and alternatives regarding  colonoscopy have been reviewed with the patient.  Questions have been answered.  All parties agreeable.   Lannette Donath, MD  10/15/2023, 11:17 AM

## 2023-10-16 LAB — SURGICAL PATHOLOGY

## 2023-10-16 NOTE — Anesthesia Preprocedure Evaluation (Signed)
 Anesthesia Evaluation  Patient identified by MRN, date of birth, ID band Patient awake    Reviewed: NPO status , Patient's Chart, lab work & pertinent test results, reviewed documented beta blocker date and time   Airway Mallampati: III  TM Distance: >3 FB Neck ROM: Full    Dental no notable dental hx. (+) Teeth Intact   Pulmonary sleep apnea and Continuous Positive Airway Pressure Ventilation , former smoker   Pulmonary exam normal breath sounds clear to auscultation       Cardiovascular hypertension, Pt. on medications and Pt. on home beta blockers + angina with exertion +CHF  + dysrhythmias Atrial Fibrillation  Rhythm:Irregular Rate:Tachycardia  LVEF 45%   Neuro/Psych  Headaches PSYCHIATRIC DISORDERS Anxiety     TIA Neuromuscular disease    GI/Hepatic ,GERD  Medicated and Controlled,,  Endo/Other    Renal/GU      Musculoskeletal   Abdominal   Peds  Hematology Eliquis therapy- last dose 5/6//21   Anesthesia Other Findings   Reproductive/Obstetrics                             Anesthesia Physical Anesthesia Plan  ASA: 3  Anesthesia Plan: General   Post-op Pain Management: Minimal or no pain anticipated   Induction: Intravenous  PONV Risk Score and Plan: 3 and Propofol infusion, TIVA and Ondansetron  Airway Management Planned: Nasal Cannula  Additional Equipment: None  Intra-op Plan:   Post-operative Plan:   Informed Consent: I have reviewed the patients History and Physical, chart, labs and discussed the procedure including the risks, benefits and alternatives for the proposed anesthesia with the patient or authorized representative who has indicated his/her understanding and acceptance.     Dental advisory given  Plan Discussed with: CRNA and Surgeon  Anesthesia Plan Comments: (Discussed risks of anesthesia with patient, including possibility of difficulty with  spontaneous ventilation under anesthesia necessitating airway intervention, PONV, and rare risks such as cardiac or respiratory or neurological events, and allergic reactions. Discussed the role of CRNA in patient's perioperative care. Patient understands.)       Anesthesia Quick Evaluation

## 2023-10-16 NOTE — Anesthesia Postprocedure Evaluation (Signed)
 Anesthesia Post Note  Patient: Jake Liu  Procedure(s) Performed: COLONOSCOPY WITH PROPOFOL  Patient location during evaluation: Endoscopy Anesthesia Type: General Level of consciousness: awake and alert Pain management: pain level controlled Vital Signs Assessment: post-procedure vital signs reviewed and stable Respiratory status: spontaneous breathing, nonlabored ventilation, respiratory function stable and patient connected to nasal cannula oxygen Cardiovascular status: blood pressure returned to baseline and stable Postop Assessment: no apparent nausea or vomiting Anesthetic complications: no  No notable events documented.   Last Vitals:  Vitals:   10/15/23 1201 10/15/23 1211  BP: 113/87 (!) 125/90  Pulse: 92 84  Resp: 19 15  Temp:    SpO2: 96% 97%    Last Pain:  Vitals:   10/15/23 1211  TempSrc:   PainSc: 0-No pain                 Stephanie Coup

## 2023-10-19 ENCOUNTER — Encounter: Payer: Self-pay | Admitting: Gastroenterology

## 2024-02-15 ENCOUNTER — Ambulatory Visit (INDEPENDENT_AMBULATORY_CARE_PROVIDER_SITE_OTHER)

## 2024-02-15 ENCOUNTER — Encounter: Payer: Self-pay | Admitting: Family

## 2024-02-15 ENCOUNTER — Ambulatory Visit: Payer: Self-pay

## 2024-02-15 ENCOUNTER — Ambulatory Visit: Admitting: Family

## 2024-02-15 VITALS — BP 128/84 | HR 96 | Temp 98.2°F | Ht 72.0 in | Wt 211.2 lb

## 2024-02-15 DIAGNOSIS — R0981 Nasal congestion: Secondary | ICD-10-CM

## 2024-02-15 LAB — CBC WITH DIFFERENTIAL/PLATELET
Basophils Absolute: 0 K/uL (ref 0.0–0.1)
Basophils Relative: 0.6 % (ref 0.0–3.0)
Eosinophils Absolute: 0.2 K/uL (ref 0.0–0.7)
Eosinophils Relative: 3.2 % (ref 0.0–5.0)
HCT: 43.9 % (ref 39.0–52.0)
Hemoglobin: 14.7 g/dL (ref 13.0–17.0)
Lymphocytes Relative: 41.2 % (ref 12.0–46.0)
Lymphs Abs: 2.6 K/uL (ref 0.7–4.0)
MCHC: 33.4 g/dL (ref 30.0–36.0)
MCV: 93 fl (ref 78.0–100.0)
Monocytes Absolute: 0.6 K/uL (ref 0.1–1.0)
Monocytes Relative: 10.2 % (ref 3.0–12.0)
Neutro Abs: 2.8 K/uL (ref 1.4–7.7)
Neutrophils Relative %: 44.8 % (ref 43.0–77.0)
Platelets: 221 K/uL (ref 150.0–400.0)
RBC: 4.73 Mil/uL (ref 4.22–5.81)
RDW: 13.9 % (ref 11.5–15.5)
WBC: 6.3 K/uL (ref 4.0–10.5)

## 2024-02-15 LAB — URINALYSIS, ROUTINE W REFLEX MICROSCOPIC
Bilirubin Urine: NEGATIVE
Hgb urine dipstick: NEGATIVE
Ketones, ur: NEGATIVE
Leukocytes,Ua: NEGATIVE
Nitrite: NEGATIVE
RBC / HPF: NONE SEEN (ref 0–?)
Specific Gravity, Urine: 1.015 (ref 1.000–1.030)
Total Protein, Urine: NEGATIVE
Urine Glucose: NEGATIVE
Urobilinogen, UA: 0.2 (ref 0.0–1.0)
WBC, UA: NONE SEEN (ref 0–?)
pH: 6 (ref 5.0–8.0)

## 2024-02-15 LAB — COMPREHENSIVE METABOLIC PANEL WITH GFR
ALT: 25 U/L (ref 0–53)
AST: 28 U/L (ref 0–37)
Albumin: 4.4 g/dL (ref 3.5–5.2)
Alkaline Phosphatase: 46 U/L (ref 39–117)
BUN: 18 mg/dL (ref 6–23)
CO2: 27 meq/L (ref 19–32)
Calcium: 9.6 mg/dL (ref 8.4–10.5)
Chloride: 100 meq/L (ref 96–112)
Creatinine, Ser: 0.68 mg/dL (ref 0.40–1.50)
GFR: 98.68 mL/min (ref >60.00)
Glucose, Bld: 100 mg/dL — ABNORMAL HIGH (ref 70–99)
Potassium: 4.5 meq/L (ref 3.5–5.1)
Sodium: 134 meq/L — ABNORMAL LOW (ref 135–145)
Total Bilirubin: 0.7 mg/dL (ref 0.2–1.2)
Total Protein: 7.6 g/dL (ref 6.0–8.3)

## 2024-02-15 MED ORDER — AMOXICILLIN-POT CLAVULANATE 875-125 MG PO TABS: 1.0000 | ORAL_TABLET | Freq: Two times a day (BID) | ORAL | 0 refills | Status: AC

## 2024-02-15 NOTE — Patient Instructions (Addendum)
 Start augmemtin  Start plain mucinex  with plenty of water  to break up congestion  Ensure to take probiotics while on antibiotics and also for 2 weeks after completion. This can either be by eating yogurt daily or taking a probiotic supplement over the counter such as Culturelle.It is important to re-colonize the gut with good bacteria and also to prevent any diarrheal infections associated with antibiotic use.   Please let me know how you are doing if symptoms do not improve or certainly if new symptoms present.

## 2024-02-15 NOTE — Progress Notes (Unsigned)
 Assessment & Plan:  There are no diagnoses linked to this encounter.   Return precautions given.   Risks, benefits, and alternatives of the medications and treatment plan prescribed today were discussed, and patient expressed understanding.   Education regarding symptom management and diagnosis given to patient on AVS either electronically or printed.  No follow-ups on file.  Rollene Northern, FNP  Subjective:    Patient ID: Jake Liu, male    DOB: [birth year], 63 y.o.   MRN: [id number]  CC: Jake Liu is a 63 y.o. male who presents today for an acute visit.    HPI: Complains of sinus congestion x 6 days  No cp, cough, fever, vision changes, leg swelling, PND, dysuria  Endorses sinus pressure, throat is scratchy, and bad taste in his mouth.  He is taking azelastine  without relief.   He is traveling a lot and eating out.      No recent abx No sick contacts He walks 3 miles per day most days.   11/30/23 cardiology follow up  Follow up AVR,   echocardiogram 11/30/23 NORMAL LEFT VENTRICULAR SYSTOLIC FUNCTION WITH NO LVH  ESTIMATED EF: 55%, CALC EF(2D): 46%  ELEVATED LA PRESSURES WITH DIASTOLIC DYSFUNCTION (GRADE 3)  NORMAL RIGHT VENTRICULAR SYSTOLIC FUNCTION  VALVULAR REGURGITATION: MODERATE AR, TRIVIAL MR, TRIVIAL PR, TRIVIAL TR  NO VALVULAR STENOSIS   Allergies: Sulfa antibiotics, Sulfasalazine, Bactrim [sulfamethoxazole-trimethoprim], Lisinopril, Zetia  [ezetimibe ], and Statins Current Outpatient Medications on File Prior to Visit  Medication Sig Dispense Refill   azelastine  (ASTELIN ) 0.1 % nasal spray Place 1 spray into both nostrils 2 (two) times daily. Use in each nostril as directed 30 mL 4   hydrocortisone  2.5 % cream Apply to itchy areas of the scalp QD-BID PRN itch and irritation. 28 g 0   METOPROLOL  SUCCINATE PO Take by mouth. Pt takes 80 mg 2x's daily     rivaroxaban  (XARELTO ) 20 MG TABS tablet Take 20 mg by mouth at bedtime.      sotalol  (BETAPACE ) 80 MG  tablet Take by mouth.     No current facility-administered medications on file prior to visit.    Review of Systems    Objective:    BP 128/84   Pulse 96   Temp 98.2 F (36.8 C) (Oral)   Ht 6' (1.829 m)   Wt 211 lb 3.2 oz (95.8 kg)   SpO2 98%   BMI 28.64 kg/m   BP Readings from Last 3 Encounters:  02/15/24 128/84  10/15/23 (!) 125/90  08/06/23 130/76   Wt Readings from Last 3 Encounters:  02/15/24 211 lb 3.2 oz (95.8 kg)  10/15/23 198 lb (89.8 kg)  08/10/23 195 lb (88.5 kg)    Physical Exam Vitals reviewed.  Constitutional:      Appearance: He is well-developed.  HENT:     Head: Normocephalic and atraumatic.     Right Ear: Hearing, tympanic membrane, ear canal and external ear normal. No decreased hearing noted. No drainage, swelling or tenderness. No middle ear effusion. Tympanic membrane is not injected, erythematous or bulging.     Left Ear: Hearing, tympanic membrane, ear canal and external ear normal. No decreased hearing noted. No drainage, swelling or tenderness.  No middle ear effusion. Tympanic membrane is not injected, erythematous or bulging.     Nose: Nose normal.     Right Sinus: No maxillary sinus tenderness or frontal sinus tenderness.     Left Sinus: No maxillary sinus tenderness or frontal sinus tenderness.  Mouth/Throat:     Pharynx: Uvula midline. Posterior oropharyngeal erythema present. No pharyngeal swelling, oropharyngeal exudate or uvula swelling.     Tonsils: No tonsillar abscesses.  Eyes:     Conjunctiva/sclera: Conjunctivae normal.  Cardiovascular:     Rate and Rhythm: Regular rhythm.     Heart sounds: Normal heart sounds.  Pulmonary:     Effort: Pulmonary effort is normal. No respiratory distress.     Breath sounds: Normal breath sounds. No wheezing, rhonchi or rales.  Lymphadenopathy:     Head:     Right side of head: No submental, submandibular, tonsillar, preauricular, posterior auricular or occipital adenopathy.     Left side  of head: No submental, submandibular, tonsillar, preauricular, posterior auricular or occipital adenopathy.     Cervical: No cervical adenopathy.  Skin:    General: Skin is warm and dry.  Neurological:     Mental Status: He is alert.  Psychiatric:        Speech: Speech normal.        Behavior: Behavior normal.

## 2024-02-15 NOTE — Telephone Encounter (Signed)
 FYI Only or Action Required?: Action required by provider: request for appointment.  Patient was last seen in primary care on 08/06/2023 by Dineen Rollene MATSU, FNP.  Called Nurse Triage reporting Sinusitis.  Symptoms began several days ago.  Interventions attempted: Nothing.  Symptoms are: unchanged.  Triage Disposition: See Physician Within 24 Hours  Patient/caregiver understands and will follow disposition?: YesCopied from CRM [id number]. Topic: Clinical - Red Word Triage >> Feb 15, 2024  9:32 AM Jake Liu wrote: Red Word that prompted transfer to Nurse Triage: patient states he has been experiencing dizziness, light head, congestions, tightness of chest, and awkward taste in mouth Reason for Disposition  [1] Sinus pain (not just congestion) AND [2] fever  Answer Assessment - Initial Assessment Questions 1. LOCATION: Where does it hurt?     temples 2. ONSET: When did the sinus pain start?  (e.g., hours, days)      Last Thursday  3. SEVERITY: How bad is the pain?   (Scale 0-10; or none, mild, moderate or severe)     4-5  5. NASAL CONGESTION: Is the nose blocked? If Yes, ask: Can you open it or must you breathe through your mouth?     na 6. NASAL DISCHARGE: Do you have discharge from your nose? If so ask, What color?     clear 7. FEVER: Do you have a fever? If Yes, ask: What is it, how was it measured, and when did it start?      Feels feverish but hasn't checked temp 8. OTHER SYMPTOMS: Do you have any other symptoms? (e.g., sore throat, cough, earache, difficulty breathing)     Cough-productive, light headedness; chest tightness, awkward taste in mouth    Pt hasn't taken any OTC to treat symptoms. Pt kept saying I just don't feel good. It's all of it together.  Protocols used: Sinus Pain or Congestion-A-AH

## 2024-02-16 NOTE — Assessment & Plan Note (Addendum)
 Presentation is not wholly conclusive for sinusitis.  Posterior erythema of the pharynx, maxillary sinus tenderness on exam.  Absence of cough.  Discussed fatigue and overall constellation of symptoms.  Opted for thorough lab evaluation, chest x-ray.  Based on duration of symptoms and sinus congestion, I have sent in Augmentin  to treat for sinusitis.  Advised patient to stay hypervigilant and let me know of any new symptoms or changes in his current symptoms.

## 2024-02-17 ENCOUNTER — Encounter: Payer: Self-pay | Admitting: Family

## 2024-02-17 ENCOUNTER — Ambulatory Visit: Payer: Self-pay | Admitting: Family

## 2024-02-17 DIAGNOSIS — R9389 Abnormal findings on diagnostic imaging of other specified body structures: Secondary | ICD-10-CM

## 2024-02-17 NOTE — Telephone Encounter (Signed)
 Copied from CRM [id number]. Topic: Clinical - Lab/Test Results >> Feb 17, 2024  3:15 PM Rea C wrote: Reason for CRM: Patient is calling in and would like a call back in regards to his xray results. Relayed lab results to patient.   Patient contact is [phone] (M).

## 2024-02-18 NOTE — Telephone Encounter (Signed)
 Copied from CRM #[id number]. Topic: Clinical - Lab/Test Results >> Feb 18, 2024  3:08 PM Lavanda D wrote: Reason for CRM: Patient is returning call from the clinic for his recent x-ray results, [id number] unable to relay results of imaging. Patient will be in a meeting later today, he would as soon as possible. He said he will be waiting.

## 2024-02-19 ENCOUNTER — Telehealth: Payer: Self-pay | Admitting: Family

## 2024-02-19 ENCOUNTER — Encounter: Payer: Self-pay | Admitting: Family

## 2024-02-19 DIAGNOSIS — G4700 Insomnia, unspecified: Secondary | ICD-10-CM

## 2024-02-19 DIAGNOSIS — R0981 Nasal congestion: Secondary | ICD-10-CM

## 2024-02-19 DIAGNOSIS — I2510 Atherosclerotic heart disease of native coronary artery without angina pectoris: Secondary | ICD-10-CM

## 2024-02-19 MED ORDER — TRAZODONE HCL 50 MG PO TABS: 50.0000 mg | ORAL_TABLET | Freq: Every day | ORAL | 3 refills | Status: AC

## 2024-02-19 MED ORDER — MUCINEX DM 30-600 MG PO TB12: ORAL_TABLET | ORAL | 0 refills | Status: AC

## 2024-02-19 MED ORDER — ROSUVASTATIN CALCIUM 40 MG PO TABS: 40.0000 mg | ORAL_TABLET | Freq: Every day | ORAL | 3 refills | Status: AC

## 2024-02-19 NOTE — Telephone Encounter (Signed)
 Spoke with patient  Reviewed labs and CXR.  He continues to taste bad tasting sinus drainage, and have sinus pressure.  He describes episodic cough.  He is walking 3 miles per day without SOB.  He has been traveling. Denies F. SOB. No recent cigars, no vaping.  He requests trazodone  50mg   He wants refill of crestor ; he has not been taking  Plan:  Complete augmentin  ; start probiotic Start mucinex  DM CT chest is ordered Offered appointment with me on Monday; he declines and states he will call the office Advised re evaluation over the weekend if he doesn't start to feel better or if new symptoms develop.

## 2024-02-25 ENCOUNTER — Ambulatory Visit

## 2024-03-10 ENCOUNTER — Ambulatory Visit
Admission: RE | Admit: 2024-03-10 | Discharge: 2024-03-10 | Disposition: A | Discharge status: Home / Self Care | Source: Ambulatory Visit | Attending: Internal Medicine | Admitting: Internal Medicine

## 2024-03-10 ENCOUNTER — Encounter: Payer: Self-pay | Admitting: Internal Medicine

## 2024-03-10 ENCOUNTER — Ambulatory Visit: Admitting: Internal Medicine

## 2024-03-10 ENCOUNTER — Ambulatory Visit: Payer: Self-pay | Admitting: Internal Medicine

## 2024-03-10 VITALS — BP 120/72 | HR 92 | Ht 72.0 in | Wt 206.8 lb

## 2024-03-10 DIAGNOSIS — S060X1D Concussion with loss of consciousness of 30 minutes or less, subsequent encounter: Secondary | ICD-10-CM | POA: Diagnosis not present

## 2024-03-10 DIAGNOSIS — G44321 Chronic post-traumatic headache, intractable: Secondary | ICD-10-CM | POA: Diagnosis not present

## 2024-03-10 DIAGNOSIS — S060X1A Concussion with loss of consciousness of 30 minutes or less, initial encounter: Secondary | ICD-10-CM | POA: Insufficient documentation
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
## 2024-03-10 NOTE — Assessment & Plan Note (Signed)
 CONCUSSION CLINIC REFERRAL MADE.  BRAIN CARE OUTLINED TO PATIENT
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
## 2024-03-10 NOTE — Patient Instructions (Addendum)
 Your headache and trouble thinking are due to A CONCUSSION   You can take 3000 mg of acetominophen (tylenol ) every day safely  In divided doses (750 mg  every 6 hours  Or 1000 mg every 8 hours.)     I am referring you to the concussion clinic

## 2024-03-31 ENCOUNTER — Ambulatory Visit: Admitting: Family

## 2024-03-31 ENCOUNTER — Encounter: Payer: Self-pay | Admitting: Family

## 2024-03-31 VITALS — BP 122/74 | HR 74 | Temp 97.6°F | Ht 72.0 in | Wt 210.2 lb

## 2024-03-31 DIAGNOSIS — S060X1D Concussion with loss of consciousness of 30 minutes or less, subsequent encounter: Secondary | ICD-10-CM

## 2024-03-31 DIAGNOSIS — M545 Low back pain, unspecified: Secondary | ICD-10-CM | POA: Diagnosis not present

## 2024-03-31 MED ORDER — GABAPENTIN 100 MG PO CAPS: 100.0000 mg | ORAL_CAPSULE | Freq: Two times a day (BID) | ORAL | 3 refills | Status: AC

## 2024-03-31 MED ORDER — METHOCARBAMOL 500 MG PO TABS: 500.0000 mg | ORAL_TABLET | Freq: Three times a day (TID) | ORAL | 0 refills | Status: DC | PRN

## 2024-03-31 NOTE — Patient Instructions (Addendum)
 Please arrange follow up with eye doctor as discussed.   Start magnesium citrate 400mg  daily for headache prevention.  I have refilled the robaxin  for bedtime only as this is sedating. You may during the daytime if you are working from home however as we discussed.   We have also started gabapentin  100mg  twice daily, morning and afternoon, for low back pain.  You may continue tylenol  1000mg  three times daily.    Please call Dr Leonce to schedule an appointment for concussion and also neck pain.   [phone].  Please let me know how you are doing.
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
## 2024-03-31 NOTE — Assessment & Plan Note (Addendum)
 Chronic, suboptimal control.   CT scan is negative for subdural hemorrhage 03/10/24. Discussed concern for cervicogenic headache due to cervical spine trauma. Start magnesium supplementation for headache prevention. Start gabapentin  for HA , LBP. Refilled robaxin  and discussed sedating properties; advised against driving while taking Robaxin . He will schedule annual eye exam. He will call to schedule an appointmnet with concussion clinic, Dr Leonce , who is already established. Close follow up. Consider nortriptyline if gabapentin  ineffective.

## 2024-03-31 NOTE — Assessment & Plan Note (Signed)
 No radicular symptoms at this time. Pending f/u with Dr Leonce, sports medicine.  Refill of Robaxin .  Start gabapentin  and titrate.  Consider physical therapy at follow-up
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
## 2024-04-08 ENCOUNTER — Ambulatory Visit: Admitting: Family Medicine

## 2024-04-08 VITALS — BP 140/86 | HR 70 | Ht 72.0 in | Wt 212.0 lb

## 2024-04-08 DIAGNOSIS — R519 Headache, unspecified: Secondary | ICD-10-CM | POA: Diagnosis not present

## 2024-04-08 DIAGNOSIS — S060X9A Concussion with loss of consciousness of unspecified duration, initial encounter: Secondary | ICD-10-CM | POA: Diagnosis not present

## 2024-04-08 DIAGNOSIS — G8929 Other chronic pain: Secondary | ICD-10-CM

## 2024-04-08 DIAGNOSIS — M545 Low back pain, unspecified: Secondary | ICD-10-CM

## 2024-04-08 DIAGNOSIS — S32020A Wedge compression fracture of second lumbar vertebra, initial encounter for closed fracture: Secondary | ICD-10-CM | POA: Diagnosis not present

## 2024-04-08 MED ORDER — LORAZEPAM 0.5 MG PO TABS: ORAL_TABLET | ORAL | 0 refills | Status: AC

## 2024-04-08 MED ORDER — NORTRIPTYLINE HCL 25 MG PO CAPS: 25.0000 mg | ORAL_CAPSULE | Freq: Every day | ORAL | 2 refills | Status: DC

## 2024-04-08 NOTE — Patient Instructions (Addendum)
 Thank you for coming in today.   I've referred you to Physical Therapy.  Let us  know if you don't hear from them in one week.   I've sent a prescription for Nortriptyline  and Ativan  to your pharmacy.   I've referred you to Physical Therapy.  Let us  know if you don't hear from them in one week.   You should hear from MRI scheduling within 1 week. If you do not hear please let me know.    Check back in 1 month

## 2024-04-18 ENCOUNTER — Other Ambulatory Visit

## 2024-04-26 ENCOUNTER — Ambulatory Visit: Admitting: Dermatology

## 2024-04-30 ENCOUNTER — Other Ambulatory Visit: Payer: Self-pay | Admitting: Family

## 2024-04-30 DIAGNOSIS — S060X1D Concussion with loss of consciousness of 30 minutes or less, subsequent encounter: Secondary | ICD-10-CM

## 2024-05-05 ENCOUNTER — Ambulatory Visit: Admitting: Family

## 2024-05-06 NOTE — Progress Notes (Deleted)
 Subjective:   I, Jake Collet, PhD, LAT, ATC acting as a scribe for [name], MD.  Chief Complaint: Jake Liu,  is a 63 y.o. male who presents for 75-month f/u LBP and concussion from injury at work Jake Liu). Pt was last seen by Dr. Lloyd on 04/08/24 and was referred to PT, and a L-spine MRI ordered. Also prescribed nortriptyline  and discussed work/activity restrictions.  MRI has not been scheduled. Pt was called and left 2 VM.  Today, pt reports ***  Injury date: 02/19/24 Visit #: 2  History of Present Illness:   Concussion Self-Reported Symptom Score Symptoms rated on a scale 1-6, in last 24 hours  Headache: ***   Pressure in head: *** Neck pain: *** Nausea or vomiting: *** Dizziness: ***  Blurred vision: ***  Balance problems: *** Sensitivity to light:  *** Sensitivity to noise: *** Feeling slowed down: *** Feeling like "in a fog": *** Don't feel right": *** Difficulty concentrating: *** Difficulty remembering: *** Fatigue or low energy: *** Confusion: *** Drowsiness: *** More emotional: *** Irritability: *** Sadness: *** Nervous or anxious: *** Trouble falling asleep: ***   Total # of Symptoms: ***/22 Total Symptom Score: ***/132  Previous Total # of Symptoms: 16/22 Previous Symptom Score: 37/132  Tinnitus: Yes/No***  Review of Systems:  ***    Review of History: ***  Objective:    Physical Examination There were no vitals filed for this visit. MSK:  *** Neuro: *** Psych: ***     Imaging:  ***  Assessment and Plan   63 y.o. male with ***    ***    Action/Discussion: Reviewed diagnosis, management options, expected outcomes, and the reasons for scheduled and emergent follow-up. Questions were adequately answered. Patient expressed verbal understanding and agreement with the following plan.     Patient Education: Reviewed with patient the risks (i.e, a repeat concussion, post-concussion syndrome, second-impact syndrome) of  returning to play prior to complete resolution, and thoroughly reviewed the signs and symptoms of concussion.Reviewed need for complete resolution of all symptoms, with rest AND exertion, prior to return to play. Reviewed red flags for urgent medical evaluation: worsening symptoms, nausea/vomiting, intractable headache, musculoskeletal changes, focal neurological deficits. [facility] Concussion Care Plan, which clearly outlines the plans stated above, was given to patient.   Level of service: ***     After Visit Summary printed out and provided to patient as appropriate.  The above documentation has been reviewed and is accurate and complete Jake Liu

## 2024-05-09 ENCOUNTER — Ambulatory Visit: Admitting: Family Medicine

## 2024-05-14 ENCOUNTER — Other Ambulatory Visit: Payer: Self-pay | Admitting: Nurse Practitioner

## 2024-05-14 ENCOUNTER — Other Ambulatory Visit: Payer: Self-pay | Admitting: Family Medicine

## 2024-05-14 DIAGNOSIS — R519 Headache, unspecified: Secondary | ICD-10-CM

## 2024-05-16 NOTE — Telephone Encounter (Signed)
 Rx refill request approved per Dr. Zollie Pee orders.

## 2024-07-01 ENCOUNTER — Encounter: Admitting: Family Medicine

## 2024-07-01 ENCOUNTER — Ambulatory Visit: Admitting: Family Medicine

## 2024-07-01 VITALS — BP 132/78 | HR 86 | Ht 72.0 in | Wt 212.0 lb

## 2024-07-01 DIAGNOSIS — M545 Low back pain, unspecified: Secondary | ICD-10-CM | POA: Diagnosis not present

## 2024-07-01 DIAGNOSIS — G8929 Other chronic pain: Secondary | ICD-10-CM

## 2024-07-01 DIAGNOSIS — S060X1D Concussion with loss of consciousness of 30 minutes or less, subsequent encounter: Secondary | ICD-10-CM | POA: Diagnosis not present

## 2024-07-01 DIAGNOSIS — M542 Cervicalgia: Secondary | ICD-10-CM | POA: Diagnosis not present

## 2024-07-01 DIAGNOSIS — R519 Headache, unspecified: Secondary | ICD-10-CM

## 2024-07-01 MED ORDER — NORTRIPTYLINE HCL 25 MG PO CAPS: 25.0000 mg | ORAL_CAPSULE | Freq: Every day | ORAL | 1 refills | Status: DC

## 2024-07-01 MED ORDER — METHOCARBAMOL 500 MG PO TABS: 500.0000 mg | ORAL_TABLET | Freq: Three times a day (TID) | ORAL | 1 refills | Status: AC | PRN

## 2024-07-01 NOTE — Progress Notes (Signed)
   Jake [name], PhD, LAT, ATC acting as a scribe for [name], MD.  Jake Liu is a 63 y.o. male who presents to [facility] at [facility] today for f/u concussion and LBP. Pt was last seen by Dr. Lloyd on 04/08/24 and was referred to [name] PT, and a L-spine MRI ordered. Also prescribed nortriptyline  and discussed work/activity restrictions.  MRI has not been scheduled. Pt was called and left 2 VM.  Today, pt c/o cont'd LBP. He didn't have time to do PT, but looked up some exercises online. He could not do the MRI do to claustrophobia. He notes a weird feeling on the L side of his face, where it hit the window. He notes memory problems, neck pain, HA, L eye. He is not taking the nortriptyline .  Injury date: 02/19/24  Pertinent review of systems: No fevers or chills  Relevant historical information: MRI claustrophobia   Exam:  BP 132/78   Pulse 86   Ht 6' (1.829 m)   Wt 212 lb (96.2 kg)   SpO2 96%   BMI 28.75 kg/m  General: Well Developed, well nourished, and in no acute distress.   MSK: C-spine decreased cervical motion.  Upper extremity strength is intact. L-spine decreased lumbar motion lower extremity strength is intact.    Lab and Radiology Results No results found for this or any previous visit (from the past 72 hours). No results found.     Assessment and Plan: 63 y.o. male with postconcussion syndrome complicated by headache cognitive dysfunction and chronic neck and back pain.  Patient was seen about 2 months ago and had trouble doing the plan that we talked about at the last visit including starting nortriptyline  and proceeding to lumbar spine MRI.  After discussion today we will proceed with physical therapy to try to address neck and back pain.  If not better consider advanced imaging such as MRI.  Of note MRI will be challenging for him due to MRI claustrophobia.  For headache we will add nortriptyline .  He will let me know if he  has trouble tolerating it.  Lastly for cognitive impairment or confusion consider cognitive rehab or ADHD type medication. Recheck in 1 month methocarbamol  refilled  PDMP not reviewed this encounter. Orders Placed This Encounter  Procedures   Ambulatory referral to Physical Therapy    Referral Priority:   Routine    Referral Type:   Physical Medicine    Referral Reason:   Specialty Services Required    Requested Specialty:   Physical Therapy    Number of Visits Requested:   1   Meds ordered this encounter  Medications   methocarbamol  (ROBAXIN ) 500 MG tablet    Sig: Take 1 tablet (500 mg total) by mouth every 8 (eight) hours as needed for muscle spasms.    Dispense:  90 tablet    Refill:  1   nortriptyline  (PAMELOR ) 25 MG capsule    Sig: Take 1 capsule (25 mg total) by mouth at bedtime.    Dispense:  90 capsule    Refill:  1     Discussed warning signs or symptoms. Please see discharge instructions. Patient expresses understanding.   The above documentation has been reviewed and is accurate and complete [name], M.D. Total encounter time 30 minutes including face-to-face time with the patient and, reviewing past medical record, and charting on the date of service.

## 2024-07-01 NOTE — Patient Instructions (Addendum)
 Referred to PT. Recommend white noise machine. Medication sent into pharmacy. Check back in one month.

## 2024-07-06 ENCOUNTER — Encounter: Admitting: Family Medicine

## 2024-07-15 ENCOUNTER — Ambulatory Visit: Payer: Self-pay

## 2024-07-15 NOTE — Telephone Encounter (Signed)
 Spoke to pt he stated that he is still not feeling well I encouraged him to go to ED pt declined offer and states that he will take the otc Robitussin and see if that helps if not he will go to ED to get checked out

## 2024-07-15 NOTE — Telephone Encounter (Signed)
 Left message for patient to give our office a call back to get scheduled for an appointment with provider with open availability.  OK for E2C2 to get patient scheduled if patient calls back. If scheduled, please notify the office.

## 2024-07-15 NOTE — Telephone Encounter (Signed)
 If no appt available, pt needs to be advised to go to urgent care.  See note below

## 2024-07-15 NOTE — Telephone Encounter (Signed)
" ° °  FYI Only or Action Required?: Action required by provider: request for appointment.  Patient was last seen in primary care on 03/31/2024 by Jake Liu MATSU, FNP.  Called Nurse Triage reporting Cough.  Symptoms began 2 days ago.  Interventions attempted: Nothing.  Symptoms are: gradually worsening.  Triage Disposition: Home Care  Patient/caregiver understands and will follow disposition?: Yes       Reason for Triage: coughing up green phlegm, headache sore throat   Reason for Disposition  Cough with cold symptoms (e.g., runny nose, postnasal drip, throat clearing)  Answer Assessment - Initial Assessment Questions 1. ONSET: When did the cough begin?      2 days ago 2. SEVERITY: How bad is the cough today?      Mild- moderate, sleeping 3. SPUTUM: Describe the color of your sputum (e.g., none, dry cough; clear, white, yellow, green)     green 4. HEMOPTYSIS: Are you coughing up any blood? If Yes, ask: How much? (e.g., flecks, streaks, tablespoons, etc.)      5. DIFFICULTY BREATHING: Are you having difficulty breathing? If Yes, ask: How bad is it? (e.g., mild, moderate, severe)      denies 6. FEVER: Do you have a fever? If Yes, ask: What is your temperature, how was it measured, and when did it start?     no 7. CARDIAC HISTORY: Do you have any history of heart disease? (e.g., heart attack, congestive heart failure)      Yes see chart 8. LUNG HISTORY: Do you have any history of lung disease?  (e.g., pulmonary embolus, asthma, emphysema)     no 9. PE RISK FACTORS: Do you have a history of blood clots? (or: recent major surgery, recent prolonged travel, bedridden)      10. OTHER SYMPTOMS: Do you have any other symptoms? (e.g., runny nose, wheezing, chest pain)       no  Protocols used: Cough - Acute Productive-A-AH  "

## 2024-07-18 NOTE — Telephone Encounter (Unsigned)
 Copied from CRM #[id number]. Topic: Clinical - Red Word Triage >> Jul 18, 2024  9:45 AM Franky GRADE wrote: Red Word that prompted transfer to Nurse Triage: Patient is returning a call from Jenate, he was seen at an urgent care on Saturday and prescribed medication but states he is still experiencing shortness of breath. He would like to speak with Jenate did not want a triage nurse.

## 2024-07-18 NOTE — Telephone Encounter (Signed)
 Spoke to pt he was seen at [facility] they prescribed medication he doesn't feel any better just yet, he declined an appt to see another provider at [facility] he will just continue to take medication prescribed on Sat and see if he is feeling better he will send message if he wants to schedule a f/up to be check out again. He stated that he will go to ED if he begins to have and sob

## 2024-07-18 NOTE — Telephone Encounter (Signed)
 LVM to check on pt to see if he felt any better and to see if he feels like he needs to be seen either [facility] or [facility] walk in

## 2024-07-26 NOTE — Telephone Encounter (Signed)
 Spoke to pt he has been scheduled for 08/08/24 for UC f/up

## 2024-08-02 ENCOUNTER — Ambulatory Visit: Admitting: Family Medicine

## 2024-08-08 ENCOUNTER — Ambulatory Visit: Admitting: Family

## 2024-08-15 ENCOUNTER — Ambulatory Visit: Admitting: Family Medicine

## 2024-08-15 ENCOUNTER — Encounter: Payer: Self-pay | Admitting: Family Medicine

## 2024-08-15 VITALS — BP 136/88 | HR 68 | Ht 72.0 in | Wt 215.0 lb

## 2024-08-15 DIAGNOSIS — R41840 Attention and concentration deficit: Secondary | ICD-10-CM

## 2024-08-15 DIAGNOSIS — G8929 Other chronic pain: Secondary | ICD-10-CM

## 2024-08-15 DIAGNOSIS — M545 Low back pain, unspecified: Secondary | ICD-10-CM | POA: Diagnosis not present

## 2024-08-15 DIAGNOSIS — S060X1D Concussion with loss of consciousness of 30 minutes or less, subsequent encounter: Secondary | ICD-10-CM

## 2024-08-15 DIAGNOSIS — F0781 Postconcussional syndrome: Secondary | ICD-10-CM

## 2024-08-15 DIAGNOSIS — M542 Cervicalgia: Secondary | ICD-10-CM | POA: Diagnosis not present

## 2024-08-15 DIAGNOSIS — R519 Headache, unspecified: Secondary | ICD-10-CM | POA: Diagnosis not present

## 2024-08-15 NOTE — Progress Notes (Signed)
"       ° °  I, Leotis Batter, CMA acting as a scribe for [name], MD.  Jake Liu is a 64 y.o. male who presents to [facility] at [facility] today for f/u post-concussion syndrome, neck, and low back pain. Pt was last seen by Dr. Lloyd on 07/01/24 and was re-prescribed nortriptyline  and re-referred to [facility] PT.  Today, pt reports starting back on Nortriptyline  but this caused GI upset. Taking Tylenol  BID. Has not had time to start PT. HA's are about the same over all, occurring daily. Notes worsening Tinnitus. Notes abnormal sensation in the left side of the face. Notes continued difficulty with memory. Notes worsening vision, L>R. Impact was on the left side.   Injury date: 02/19/24   Pertinent review of systems: No fevers or chills  Relevant historical information: Coronary artery disease.   Exam:  BP 136/88   Pulse 68   Ht 6' (1.829 m)   Wt 215 lb (97.5 kg)   SpO2 97%   BMI 29.16 kg/m  General: Well Developed, well nourished, and in no acute distress.   MSK: L-spine decreased lumbar motion.  Neuropsych alert and oriented normal coordination speech thought process and affect.    Lab and Radiology Results No results found for this or any previous visit (from the past 72 hours). No results found.     Assessment and Plan: 64 y.o. male with postconcussion syndrome complicating low back pain, left vestibular ocular symptoms, and concentration and attention.  Symptoms ongoing now for about 6 months.  We discussed options.  Plan for physical therapy referral.  He does think his schedule will allow it now.  PT could be helpful for back pain and left vestibular ocular symptoms.  He continues to experience some cognitive difficulties.  Could consider speech therapy for cognitive rehab but I am not optimistic that his schedule will allow it.  Next logical step would be stimulant based ADHD medications which he is reluctant to consider.  Can prescribe them in the  interim if needed.  Recheck in 2 months.   PDMP not reviewed this encounter. Orders Placed This Encounter  Procedures   Ambulatory referral to Physical Therapy    Referral Priority:   Routine    Referral Type:   Physical Medicine    Referral Reason:   Specialty Services Required    Requested Specialty:   Physical Therapy    Number of Visits Requested:   1   No orders of the defined types were placed in this encounter.    Discussed warning signs or symptoms. Please see discharge instructions. Patient expresses understanding.   The above documentation has been reviewed and is accurate and complete [name], M.D.  Total encounter time 30 minutes including face-to-face time with the patient and, reviewing past medical record, and charting on the date of service.    "

## 2024-08-15 NOTE — Patient Instructions (Addendum)
 Thank you for coming in today.   A referral for physical therapy has been submitted. A representative from the physical therapy office will contact you to coordinate scheduling after confirming your benefits with your insurance provider. If you do not hear from the physical therapy office within the next 1-2 weeks, please let us  know.   See you back in 2 months.

## 2024-09-26 ENCOUNTER — Ambulatory Visit: Admitting: Family Medicine
